# Patient Record
Sex: Male | Born: 1940
Health system: Southern US, Community
[De-identification: ages and names within clinical notes are randomized; demographics above are authoritative.]

## PROBLEM LIST (undated history)

## (undated) DIAGNOSIS — N400 Enlarged prostate without lower urinary tract symptoms: Secondary | ICD-10-CM

## (undated) DIAGNOSIS — R609 Edema, unspecified: Secondary | ICD-10-CM

## (undated) DIAGNOSIS — Z8719 Personal history of other diseases of the digestive system: Secondary | ICD-10-CM

## (undated) DIAGNOSIS — E782 Mixed hyperlipidemia: Secondary | ICD-10-CM

## (undated) DIAGNOSIS — M549 Dorsalgia, unspecified: Secondary | ICD-10-CM

## (undated) DIAGNOSIS — M109 Gout, unspecified: Secondary | ICD-10-CM

## (undated) DIAGNOSIS — I10 Essential (primary) hypertension: Secondary | ICD-10-CM

## (undated) DIAGNOSIS — N189 Chronic kidney disease, unspecified: Secondary | ICD-10-CM

## (undated) DIAGNOSIS — E119 Type 2 diabetes mellitus without complications: Secondary | ICD-10-CM

## (undated) DIAGNOSIS — I700 Atherosclerosis of aorta: Secondary | ICD-10-CM

## (undated) DIAGNOSIS — Z85118 Personal history of other malignant neoplasm of bronchus and lung: Secondary | ICD-10-CM

## (undated) DIAGNOSIS — E1165 Type 2 diabetes mellitus with hyperglycemia: Secondary | ICD-10-CM

## (undated) DIAGNOSIS — M519 Unspecified thoracic, thoracolumbar and lumbosacral intervertebral disc disorder: Secondary | ICD-10-CM

## (undated) DIAGNOSIS — F1021 Alcohol dependence, in remission: Secondary | ICD-10-CM

## (undated) DIAGNOSIS — D509 Iron deficiency anemia, unspecified: Secondary | ICD-10-CM

## (undated) DIAGNOSIS — J984 Other disorders of lung: Secondary | ICD-10-CM

## (undated) HISTORY — DX: Iron deficiency anemia, unspecified: D50.9

## (undated) HISTORY — DX: Essential (primary) hypertension: I10

## (undated) HISTORY — DX: Chronic kidney disease, unspecified: N18.9

## (undated) HISTORY — DX: Type 2 diabetes mellitus without complications: E11.9

## (undated) HISTORY — DX: Personal history of other malignant neoplasm of bronchus and lung: Z85.118

## (undated) HISTORY — DX: Benign prostatic hyperplasia without lower urinary tract symptoms: N40.0

## (undated) HISTORY — DX: Unspecified thoracic, thoracolumbar and lumbosacral intervertebral disc disorder: M51.9

## (undated) HISTORY — DX: Other disorders of lung: J98.4

## (undated) HISTORY — DX: Atherosclerosis of aorta: I70.0

## (undated) HISTORY — PX: BACK SURGERY: SHX140

## (undated) HISTORY — DX: Mixed hyperlipidemia: E78.2

## (undated) HISTORY — DX: Type 2 diabetes mellitus with hyperglycemia: E11.65

## (undated) HISTORY — DX: Personal history of other diseases of the digestive system: Z87.19

## (undated) HISTORY — DX: Dorsalgia, unspecified: M54.9

## (undated) HISTORY — DX: Alcohol dependence, in remission: F10.21

## (undated) HISTORY — DX: Edema, unspecified: R60.9

## (undated) HISTORY — DX: Gout, unspecified: M10.9

---

## 2002-06-20 ENCOUNTER — Ambulatory Visit: Admission: RE | Admit: 2002-06-20 | Discharge: 2002-06-20 | Payer: Self-pay | Admitting: Internal Medicine

## 2013-10-16 ENCOUNTER — Encounter: Payer: Self-pay | Admitting: Podiatrist

## 2013-10-16 ENCOUNTER — Ambulatory Visit (INDEPENDENT_AMBULATORY_CARE_PROVIDER_SITE_OTHER): Payer: Medicare HMO | Admitting: Podiatrist

## 2013-10-16 DIAGNOSIS — M216X1 Other acquired deformities of right foot: Secondary | ICD-10-CM

## 2013-10-16 DIAGNOSIS — M216X9 Other acquired deformities of unspecified foot: Secondary | ICD-10-CM

## 2013-10-16 NOTE — Progress Notes (Signed)
   Subjective:    Patient ID: Jeffrey Randall, male    DOB: June 29, 1940, 73 y.o.   MRN: [id number]  HPI I AM A DIABETIC AND NEED MY FEET CHECKED    Review of Systems  Cardiovascular: Positive for leg swelling.  Neurological: Positive for headaches.  All other systems reviewed and are negative.      Objective:   Physical Exam GENERAL APPEARANCE: Alert, conversant. Appropriately groomed. No acute distress.  VASCULAR: Pedal pulses palpable at 1/4 DP and PT bilateral.  Capillary refill time is immediate to all digits,  Proximal to distal cooling it warm to warm.  No swelling is noted at today's visit however he states he does at noticed swelling at the sock level at times.  NEUROLOGIC: sensation is intact epicritically and protectively to 5.07 monofilament at 5/5 sites bilateral.  Light touch is intact bilateral, vibratory sensation intact bilateral, achilles tendon reflex is intact bilateral.  MUSCULOSKELETAL: acceptable muscle strength, tone and stability bilateral.  Prominent first metatarsal head is present on the right which causes pain at times.  Intrinsic muscluature intact bilateral.   DERMATOLOGIC: skin color, texture, and turger are within normal limits.  No preulcerative lesions are seen, no interdigital maceration noted.  No open lesions present.  Digital nails are mildly thick and dystrophic 1 and 2 bilateral.      Assessment & Plan:  Diabetes without complication, symptomatic toenails x 4  Plan: After her diabetic foot evaluation was undertaken at today's visit. I debrided the mycotic nails without complication. He'll be seen back as needed for followup. Discussed we can consider a orthotic for the submetatarsal one discomfort however he states that it is sporadic at this time and he will like to hold off. Also discussed the swelling. Recommended compression stockings or speaking with his primary care physician about swelling should it become a problem. He will be seen back as needed  for followup.

## 2013-10-16 NOTE — Patient Instructions (Signed)
Diabetes and Foot Care Diabetes may cause you to have problems because of poor blood supply (circulation) to your feet and legs. This may cause the skin on your feet to become thinner, break easier, and heal more slowly. Your skin may become dry, and the skin may peel and crack. You may also have nerve damage in your legs and feet causing decreased feeling in them. You may not notice minor injuries to your feet that could lead to infections or more serious problems. Taking care of your feet is one of the most important things you can do for yourself.  HOME CARE INSTRUCTIONS  Wear shoes at all times, even in the house. Do not go barefoot. Bare feet are easily injured.  Check your feet daily for blisters, cuts, and redness. If you cannot see the bottom of your feet, use a mirror or ask someone for help.  Wash your feet with warm water (do not use hot water) and mild soap. Then pat your feet and the areas between your toes until they are completely dry. Do not soak your feet as this can dry your skin.  Apply a moisturizing lotion or petroleum jelly (that does not contain alcohol and is unscented) to the skin on your feet and to dry, brittle toenails. Do not apply lotion between your toes.  Trim your toenails straight across. Do not dig under them or around the cuticle. File the edges of your nails with an emery board or nail file.  Do not cut corns or calluses or try to remove them with medicine.  Wear clean socks or stockings every day. Make sure they are not too tight. Do not wear knee-high stockings since they may decrease blood flow to your legs.  Wear shoes that fit properly and have enough cushioning. To break in new shoes, wear them for just a few hours a day. This prevents you from injuring your feet. Always look in your shoes before you put them on to be sure there are no objects inside.  Do not cross your legs. This may decrease the blood flow to your feet.  If you find a minor scrape,  cut, or break in the skin on your feet, keep it and the skin around it clean and dry. These areas may be cleansed with mild soap and water. Do not cleanse the area with peroxide, alcohol, or iodine.  When you remove an adhesive bandage, be sure not to damage the skin around it.  If you have a wound, look at it several times a day to make sure it is healing.  Do not use heating pads or hot water bottles. They may burn your skin. If you have lost feeling in your feet or legs, you may not know it is happening until it is too late.  Make sure your health care provider performs a complete foot exam at least annually or more often if you have foot problems. Report any cuts, sores, or bruises to your health care provider immediately. SEEK MEDICAL CARE IF:   You have an injury that is not healing.  You have cuts or breaks in the skin.  You have an ingrown nail.  You notice redness on your legs or feet.  You feel burning or tingling in your legs or feet.  You have pain or cramps in your legs and feet.  Your legs or feet are numb.  Your feet always feel cold. SEEK IMMEDIATE MEDICAL CARE IF:   There is increasing redness,   swelling, or pain in or around a wound.  There is a red line that goes up your leg.  Pus is coming from a wound.  You develop a fever or as directed by your health care provider.  You notice a bad smell coming from an ulcer or wound. Document Released: 03/26/2000 Document Revised: 11/29/2012 Document Reviewed: 09/05/2012 [employer] Patient Information 2015 [employer]. This information is not intended to replace advice given to you by your health care provider. Make sure you discuss any questions you have with your health care provider.  

## 2014-06-14 ENCOUNTER — Encounter (HOSPITAL_COMMUNITY): Payer: Self-pay | Admitting: Emergency Medicine

## 2014-06-14 ENCOUNTER — Emergency Department (HOSPITAL_COMMUNITY)
Admission: EM | Admit: 2014-06-14 | Discharge: 2014-06-14 | Disposition: A | Discharge status: Home / Self Care | Payer: PPO | Source: Home / Self Care | Attending: Emergency Medicine | Admitting: Emergency Medicine

## 2014-06-14 ENCOUNTER — Emergency Department (HOSPITAL_COMMUNITY): Payer: PPO

## 2014-06-14 DIAGNOSIS — R062 Wheezing: Secondary | ICD-10-CM | POA: Insufficient documentation

## 2014-06-14 DIAGNOSIS — R739 Hyperglycemia, unspecified: Secondary | ICD-10-CM

## 2014-06-14 DIAGNOSIS — M549 Dorsalgia, unspecified: Secondary | ICD-10-CM | POA: Diagnosis not present

## 2014-06-14 DIAGNOSIS — Z720 Tobacco use: Secondary | ICD-10-CM | POA: Insufficient documentation

## 2014-06-14 DIAGNOSIS — I10 Essential (primary) hypertension: Secondary | ICD-10-CM | POA: Insufficient documentation

## 2014-06-14 DIAGNOSIS — M545 Low back pain, unspecified: Secondary | ICD-10-CM

## 2014-06-14 DIAGNOSIS — E1165 Type 2 diabetes mellitus with hyperglycemia: Secondary | ICD-10-CM | POA: Insufficient documentation

## 2014-06-14 DIAGNOSIS — Z880 Allergy status to penicillin: Secondary | ICD-10-CM | POA: Insufficient documentation

## 2014-06-14 DIAGNOSIS — M5136 Other intervertebral disc degeneration, lumbar region: Secondary | ICD-10-CM | POA: Diagnosis not present

## 2014-06-14 DIAGNOSIS — Z79899 Other long term (current) drug therapy: Secondary | ICD-10-CM | POA: Insufficient documentation

## 2014-06-14 DIAGNOSIS — Z7982 Long term (current) use of aspirin: Secondary | ICD-10-CM | POA: Insufficient documentation

## 2014-06-14 LAB — CBC WITH DIFFERENTIAL/PLATELET
BASOS ABS: 0 10*3/uL (ref 0.0–0.1)
BASOS PCT: 0 % (ref 0–1)
EOS ABS: 0 10*3/uL (ref 0.0–0.7)
EOS PCT: 0 % (ref 0–5)
HCT: 35.4 % — ABNORMAL LOW (ref 39.0–52.0)
Hemoglobin: 12.1 g/dL — ABNORMAL LOW (ref 13.0–17.0)
LYMPHS PCT: 3 % — AB (ref 12–46)
Lymphs Abs: 0.4 10*3/uL — ABNORMAL LOW (ref 0.7–4.0)
MCH: 34.1 pg — ABNORMAL HIGH (ref 26.0–34.0)
MCHC: 34.2 g/dL (ref 30.0–36.0)
MCV: 99.7 fL (ref 78.0–100.0)
MONO ABS: 0.3 10*3/uL (ref 0.1–1.0)
MONOS PCT: 2 % — AB (ref 3–12)
NEUTROS ABS: 12.1 10*3/uL — AB (ref 1.7–7.7)
Neutrophils Relative %: 95 % — ABNORMAL HIGH (ref 43–77)
Platelets: 350 10*3/uL (ref 150–400)
RBC: 3.55 MIL/uL — AB (ref 4.22–5.81)
RDW: 13.7 % (ref 11.5–15.5)
WBC: 12.8 10*3/uL — AB (ref 4.0–10.5)

## 2014-06-14 LAB — COMPREHENSIVE METABOLIC PANEL
ALBUMIN: 2.9 g/dL — AB (ref 3.5–5.2)
ALT: 27 U/L (ref 0–53)
AST: 27 U/L (ref 0–37)
Alkaline Phosphatase: 64 U/L (ref 39–117)
Anion gap: 10 (ref 5–15)
BUN: 24 mg/dL — ABNORMAL HIGH (ref 6–23)
CALCIUM: 10.3 mg/dL (ref 8.4–10.5)
CO2: 25 mmol/L (ref 19–32)
Chloride: 98 mmol/L (ref 96–112)
Creatinine, Ser: 1.48 mg/dL — ABNORMAL HIGH (ref 0.50–1.35)
GFR calc Af Amer: 52 mL/min — ABNORMAL LOW (ref >90)
GFR, EST NON AFRICAN AMERICAN: 45 mL/min — AB (ref >90)
Glucose, Bld: 407 mg/dL — ABNORMAL HIGH (ref 70–99)
Potassium: 4.8 mmol/L (ref 3.5–5.1)
SODIUM: 133 mmol/L — AB (ref 135–145)
Total Bilirubin: 0.5 mg/dL (ref 0.3–1.2)
Total Protein: 6.6 g/dL (ref 6.0–8.3)

## 2014-06-14 LAB — URINALYSIS, ROUTINE W REFLEX MICROSCOPIC
Bilirubin Urine: NEGATIVE
Hgb urine dipstick: NEGATIVE
Ketones, ur: NEGATIVE mg/dL
LEUKOCYTES UA: NEGATIVE
Nitrite: NEGATIVE
PH: 6 (ref 5.0–8.0)
Protein, ur: NEGATIVE mg/dL
Specific Gravity, Urine: 1.01 (ref 1.005–1.030)
Urobilinogen, UA: 0.2 mg/dL (ref 0.0–1.0)

## 2014-06-14 LAB — C-REACTIVE PROTEIN: CRP: 2.8 mg/dL — AB (ref <0.60)

## 2014-06-14 LAB — SEDIMENTATION RATE: Sed Rate: 100 mm/hr — ABNORMAL HIGH (ref 0–16)

## 2014-06-14 LAB — CBG MONITORING, ED: Glucose-Capillary: 368 mg/dL — ABNORMAL HIGH (ref 70–99)

## 2014-06-14 LAB — URINE MICROSCOPIC-ADD ON

## 2014-06-14 MED ORDER — GADOBENATE DIMEGLUMINE 529 MG/ML IV SOLN
15.0000 mL | Freq: Once | INTRAVENOUS | Status: AC | PRN
  Administered 2014-06-14: 15 mL via INTRAVENOUS

## 2014-06-14 MED ORDER — HYDROMORPHONE HCL 1 MG/ML IJ SOLN
1.0000 mg | Freq: Once | INTRAMUSCULAR | Status: AC
  Administered 2014-06-14: 1 mg via INTRAVENOUS
  Filled 2014-06-14: qty 1

## 2014-06-14 MED ORDER — HYDROMORPHONE HCL 1 MG/ML IJ SOLN
INTRAMUSCULAR | Status: AC
  Filled 2014-06-14: qty 1

## 2014-06-14 MED ORDER — SODIUM CHLORIDE 0.9 % IV BOLUS (SEPSIS)
1000.0000 mL | Freq: Once | INTRAVENOUS | Status: AC
  Administered 2014-06-14: 1000 mL via INTRAVENOUS

## 2014-06-14 NOTE — ED Provider Notes (Signed)
CSN: [id number]     Arrival date & time 06/14/14  1429 History   First MD Initiated Contact with Patient 06/14/14 1549     Chief Complaint  Patient presents with  . Back Pain     Patient is a 74 y.o. male presenting with back pain. The history is provided by the patient. No language interpreter was used.  Back Pain  Jeffrey Randall presents for evaluation of low back pain. History is provided by patient and his son. He reports low back pain that started 6 days ago.   Pain is described as sharp and spreading across the entire low back. Pain is constant and worse with movement. He has difficulty with minimal movement due to pain. He did have abdominal pain 7 days ago with associated diaphoresis, which completely resolved prior to his back pain starting.   He denies any associated fevers, chest pain, nausea, vomiting, dysuria, urinary incontinence or retention, diarrhea. He was seen at the [facility] emergency department 5 days ago with CT scan of his abdomen and labs performed. He was started on oxycodone at that time and prednisone. He had minimal improvement in his symptoms with this medication since all his family doctor yesterday who recommended further evaluation the emergency department for ongoing pain. Symptoms are severe, constant, worsening.   Past Medical History  Diagnosis Date  . Diabetes mellitus without complication   . Swelling   . Hypertension    History reviewed. No pertinent past surgical history. History reviewed. No pertinent family history. History  Substance Use Topics  . Smoking status: Current Some Day Smoker  . Smokeless tobacco: Never Used  . Alcohol Use: Yes    Review of Systems  Musculoskeletal: Positive for back pain.  All other systems reviewed and are negative.     Allergies  Penicillins  Home Medications   Prior to Admission medications   Medication Sig Start Date End Date Taking? Authorizing Provider  aspirin 81 MG tablet Take 81 mg by mouth daily.     Historical Provider, MD  glimepiride (AMARYL) 2 MG tablet  09/04/13   Historical Provider, MD  losartan-hydrochlorothiazide (HYZAAR) 100-25 MG per tablet Take 1 tablet by mouth daily.    Historical Provider, MD  metFORMIN (GLUCOPHAGE) 1000 MG tablet  09/17/13   Historical Provider, MD  Multiple Vitamins-Minerals (CENTRUM SILVER PO) Take by mouth.    Historical Provider, MD  Omega-3 Fatty Acids (FISH OIL) 1200 MG CAPS Take by mouth.    Historical Provider, MD  simvastatin (ZOCOR) 20 MG tablet  10/01/13   Historical Provider, MD  traZODone (DESYREL) 50 MG tablet Take 50 mg by mouth at bedtime.    Historical Provider, MD  vitamin B-12 (CYANOCOBALAMIN) 1000 MCG tablet Take 2,500 mcg by mouth daily.    Historical Provider, MD   BP 160/72 mmHg  Pulse 80  Temp(Src) 98.7 F (37.1 C) (Oral)  Resp 18  SpO2 100% Physical Exam  Constitutional: He is oriented to person, place, and time. He appears well-developed and well-nourished.  HENT:  Head: Normocephalic and atraumatic.  Cardiovascular: Normal rate and regular rhythm.   No murmur heard. Pulmonary/Chest: Effort normal. No respiratory distress.  Occasional end expiratory wheezes  Abdominal: Soft. There is no tenderness. There is no rebound and no guarding.  Musculoskeletal: He exhibits no edema or tenderness.  There is no CVA or bony C, T, L-spine tenderness.  Neurological: He is alert and oriented to person, place, and time.  Significant pain with trying to sit up  in bed. 5 out of 5 strength in all four extremities. Sensation to light touch intact in all four extremities.  Skin: Skin is warm and dry.  Psychiatric: He has a normal mood and affect. His behavior is normal.  Nursing note and vitals reviewed.   ED Course  Procedures (including critical care time) Labs Review Labs Reviewed  COMPREHENSIVE METABOLIC PANEL - Abnormal; Notable for the following:    Sodium 133 (*)    Glucose, Bld 407 (*)    BUN 24 (*)    Creatinine, Ser 1.48 (*)     Albumin 2.9 (*)    GFR calc non Af Amer 45 (*)    GFR calc Af Amer 52 (*)    All other components within normal limits  CBC WITH DIFFERENTIAL/PLATELET - Abnormal; Notable for the following:    WBC 12.8 (*)    RBC 3.55 (*)    Hemoglobin 12.1 (*)    HCT 35.4 (*)    MCH 34.1 (*)    Neutrophils Relative % 95 (*)    Neutro Abs 12.1 (*)    Lymphocytes Relative 3 (*)    Lymphs Abs 0.4 (*)    Monocytes Relative 2 (*)    All other components within normal limits  URINALYSIS, ROUTINE W REFLEX MICROSCOPIC - Abnormal; Notable for the following:    Glucose, UA >1000 (*)    All other components within normal limits  SEDIMENTATION RATE - Abnormal; Notable for the following:    Sed Rate 100 (*)    All other components within normal limits  C-REACTIVE PROTEIN - Abnormal; Notable for the following:    CRP 2.8 (*)    All other components within normal limits  CBG MONITORING, ED - Abnormal; Notable for the following:    Glucose-Capillary 368 (*)    All other components within normal limits  URINE MICROSCOPIC-ADD ON    Imaging Review Mr Lumbar Spine W Wo Contrast  06/14/2014   CLINICAL DATA:  Low back pain with anterior thigh tingling for 4 days. History of to remote back surgeries.  EXAM: MRI LUMBAR SPINE WITHOUT AND WITH CONTRAST  TECHNIQUE: Multiplanar and multiecho pulse sequences of the lumbar spine were obtained without and with intravenous contrast.  CONTRAST:  8mL MULTIHANCE GADOBENATE DIMEGLUMINE 529 MG/ML IV SOLN  COMPARISON:  CT abdomen and pelvis 06/11/2014  FINDINGS: There is straightening of the normal lumbar lordosis. There is no listhesis. Vertebral body heights are preserved. There is diffuse lumbar disc desiccation. Severe disc space narrowing is present L5-S1 with prominent Modic type 2 degenerative marrow changes. No significant vertebral marrow edema is identified. There is no abnormal enhancement. Scattered, small Schmorl's nodes are present. Conus medullaris is normal in signal  and terminates at L1. 1.5 cm left adrenal nodule is unchanged.  L1-2:  Negative.  L2-3: Minimal disc bulging results in minimal bilateral neural foraminal narrowing without spinal stenosis.  L3-4: Minimal disc bulging results in minimal bilateral neural foraminal narrowing without spinal stenosis.  L4-5: Mild circumferential disc bulging and mild bilateral facet hypertrophy result in mild bilateral lateral recess narrowing without spinal canal or neural foraminal stenosis.  L5-S1: Severe disc space height loss and circumferential endplate osteophytosis result in moderate bilateral neural foraminal stenosis without spinal stenosis. Prior left laminectomy.  IMPRESSION: 1. Severe disc degeneration at L5-S1 resulting in moderate bilateral neural foraminal stenosis. No spinal stenosis. 2. Mild disc and facet degeneration elsewhere as above without evidence of neural impingement.   Electronically Signed   By:  Logan Bores   On: 06/14/2014 18:53     EKG Interpretation None      MDM   Final diagnoses:  Low back pain  Hyperglycemia    1700 - received and reviewed records from [facility].  Pt had blood cultures on 06/11/14.  1/2 bottles grew out Strep gallolyticus ssp.  CT abd was performed with no evidence of acute abnormality.    Pt here with new onset low back pain one week ago.  CBC with mild leukocytosis but pt currently on steroids.  MRI obtained to eval for evidence of discitis/osteomyelitis given 1/2 positive blood cultures at home.  MRI with severe disc degeneration but no evidence of infectious process or nerve impingement.  Pt does have hyperglycemia - he stopped his metformin and is currently on steroids.  Discussed oral fluid hydration, restarting his diabetic medication, close pcp follow up and return precautions.   Quintella Reichert, MD 06/14/14 2342

## 2014-06-14 NOTE — Discharge Instructions (Signed)
Your blood sugar was elevated today as well as your inflammatory markers (ESR or erythrocyte sedimentation rate).  Your Creatinine was also slightly elevated and this will need rechecked.  Please see your doctor in the next two days for recheck.  Drink plenty of fluids.  Take the pain medications as directed and take a stool softener, available over the counter.     Back Pain, Adult Low back pain is very common. About 1 in 5 people have back pain.The cause of low back pain is rarely dangerous. The pain often gets better over time.About half of people with a sudden onset of back pain feel better in just 2 weeks. About 8 in 10 people feel better by 6 weeks.  CAUSES Some common causes of back pain include:  Strain of the muscles or ligaments supporting the spine.  Wear and tear (degeneration) of the spinal discs.  Arthritis.  Direct injury to the back. DIAGNOSIS Most of the time, the direct cause of low back pain is not known.However, back pain can be treated effectively even when the exact cause of the pain is unknown.Answering your caregiver's questions about your overall health and symptoms is one of the most accurate ways to make sure the cause of your pain is not dangerous. If your caregiver needs more information, he or she may order lab work or imaging tests (X-rays or MRIs).However, even if imaging tests show changes in your back, this usually does not require surgery. HOME CARE INSTRUCTIONS For many people, back pain returns.Since low back pain is rarely dangerous, it is often a condition that people can learn to [facility] their own.   Remain active. It is stressful on the back to sit or stand in one place. Do not sit, drive, or stand in one place for more than 30 minutes at a time. Take short walks on level surfaces as soon as pain allows.Try to increase the length of time you walk each day.  Do not stay in bed.Resting more than 1 or 2 days can delay your recovery.  Do not  avoid exercise or work.Your body is made to move.It is not dangerous to be active, even though your back may hurt.Your back will likely heal faster if you return to being active before your pain is gone.  Pay attention to your body when you bend and lift. Many people have less discomfortwhen lifting if they bend their knees, keep the load close to their bodies,and avoid twisting. Often, the most comfortable positions are those that put less stress on your recovering back.  Find a comfortable position to sleep. Use a firm mattress and lie on your side with your knees slightly bent. If you lie on your back, put a pillow under your knees.  Only take over-the-counter or prescription medicines as directed by your caregiver. Over-the-counter medicines to reduce pain and inflammation are often the most helpful.Your caregiver may prescribe muscle relaxant drugs.These medicines help dull your pain so you can more quickly return to your normal activities and healthy exercise.  Put ice on the injured area.  Put ice in a plastic bag.  Place a towel between your skin and the bag.  Leave the ice on for 15-20 minutes, 03-04 times a day for the first 2 to 3 days. After that, ice and heat may be alternated to reduce pain and spasms.  Ask your caregiver about trying back exercises and gentle massage. This may be of some benefit.  Avoid feeling anxious or stressed.Stress increases muscle  tension and can worsen back pain.It is important to recognize when you are anxious or stressed and learn ways to manage it.Exercise is a great option. SEEK MEDICAL CARE IF:  You have pain that is not relieved with rest or medicine.  You have pain that does not improve in 1 week.  You have new symptoms.  You are generally not feeling well. SEEK IMMEDIATE MEDICAL CARE IF:   You have pain that radiates from your back into your legs.  You develop new bowel or bladder control problems.  You have unusual weakness  or numbness in your arms or legs.  You develop nausea or vomiting.  You develop abdominal pain.  You feel faint. Document Released: 03/29/2005 Document Revised: 09/28/2011 Document Reviewed: 07/31/2013 [facility] Patient Information 2015 [place], [place]. This information is not intended to replace advice given to you by your health care provider. Make sure you discuss any questions you have with your health care provider.  Hyperglycemia Hyperglycemia occurs when the glucose (sugar) in your blood is too high. Hyperglycemia can happen for many reasons, but it most often happens to people who do not know they have diabetes or are not managing their diabetes properly.  CAUSES  Whether you have diabetes or not, there are other causes of hyperglycemia. Hyperglycemia can occur when you have diabetes, but it can also occur in other situations that you might not be as aware of, such as: Diabetes  If you have diabetes and are having problems controlling your blood glucose, hyperglycemia could occur because of some of the following reasons:  Not following your meal plan.  Not taking your diabetes medications or not taking it properly.  Exercising less or doing less activity than you normally do.  Being sick. Pre-diabetes  This cannot be ignored. Before people develop Type 2 diabetes, they almost always have "pre-diabetes." This is when your blood glucose levels are higher than normal, but not yet high enough to be diagnosed as diabetes. Research has shown that some long-term damage to the body, especially the heart and circulatory system, may already be occurring during pre-diabetes. If you take action to manage your blood glucose when you have pre-diabetes, you may delay or prevent Type 2 diabetes from developing. Stress  If you have diabetes, you may be "diet" controlled or on oral medications or insulin to control your diabetes. However, you may find that your blood glucose is higher than usual  in the hospital whether you have diabetes or not. This is often referred to as "stress hyperglycemia." Stress can elevate your blood glucose. This happens because of hormones put out by the body during times of stress. If stress has been the cause of your high blood glucose, it can be followed regularly by your caregiver. That way he/she can make sure your hyperglycemia does not continue to get worse or progress to diabetes. Steroids  Steroids are medications that act on the infection fighting system (immune system) to block inflammation or infection. One side effect can be a rise in blood glucose. Most people can produce enough extra insulin to allow for this rise, but for those who cannot, steroids make blood glucose levels go even higher. It is not unusual for steroid treatments to "uncover" diabetes that is developing. It is not always possible to determine if the hyperglycemia will go away after the steroids are stopped. A special blood test called an A1c is sometimes done to determine if your blood glucose was elevated before the steroids were started. SYMPTOMS  Thirsty.  Frequent urination.  Dry mouth.  Blurred vision.  Tired or fatigue.  Weakness.  Sleepy.  Tingling in feet or leg. DIAGNOSIS  Diagnosis is made by monitoring blood glucose in one or all of the following ways:  A1c test. This is a chemical found in your blood.  Fingerstick blood glucose monitoring.  Laboratory results. TREATMENT  First, knowing the cause of the hyperglycemia is important before the hyperglycemia can be treated. Treatment may include, but is not be limited to:  Education.  Change or adjustment in medications.  Change or adjustment in meal plan.  Treatment for an illness, infection, etc.  More frequent blood glucose monitoring.  Change in exercise plan.  Decreasing or stopping steroids.  Lifestyle changes. HOME CARE INSTRUCTIONS   Test your blood glucose as directed.  Exercise  regularly. Your caregiver will give you instructions about exercise. Pre-diabetes or diabetes which comes on with stress is helped by exercising.  Eat wholesome, balanced meals. Eat often and at regular, fixed times. Your caregiver or nutritionist will give you a meal plan to guide your sugar intake.  Being at an ideal weight is important. If needed, losing as little as 10 to 15 pounds may help improve blood glucose levels. SEEK MEDICAL CARE IF:   You have questions about medicine, activity, or diet.  You continue to have symptoms (problems such as increased thirst, urination, or weight gain). SEEK IMMEDIATE MEDICAL CARE IF:   You are vomiting or have diarrhea.  Your breath smells fruity.  You are breathing faster or slower.  You are very sleepy or incoherent.  You have numbness, tingling, or pain in your feet or hands.  You have chest pain.  Your symptoms get worse even though you have been following your caregiver's orders.  If you have any other questions or concerns. Document Released: 09/22/2000 Document Revised: 06/21/2011 Document Reviewed: 07/26/2011 [facility] Patient Information 2015 [place], [place]. This information is not intended to replace advice given to you by your health care provider. Make sure you discuss any questions you have with your health care provider.

## 2014-06-14 NOTE — ED Notes (Signed)
Pt sts severe lower back pain not improved with pain meds or pred; pt family sts pt could be constipated or dehydrated

## 2014-06-17 ENCOUNTER — Emergency Department (HOSPITAL_COMMUNITY): Payer: PPO

## 2014-06-17 ENCOUNTER — Encounter (HOSPITAL_COMMUNITY): Payer: Self-pay | Admitting: Physical Medicine and Rehabilitation

## 2014-06-17 ENCOUNTER — Inpatient Hospital Stay (HOSPITAL_COMMUNITY)
Admission: EM | Admit: 2014-06-17 | Discharge: 2014-06-19 | DRG: 552 | Disposition: A | Discharge status: Home Health | Payer: PPO | Attending: Family Medicine | Admitting: Family Medicine

## 2014-06-17 DIAGNOSIS — M545 Low back pain, unspecified: Secondary | ICD-10-CM | POA: Diagnosis present

## 2014-06-17 DIAGNOSIS — M549 Dorsalgia, unspecified: Secondary | ICD-10-CM | POA: Diagnosis not present

## 2014-06-17 DIAGNOSIS — Z7982 Long term (current) use of aspirin: Secondary | ICD-10-CM | POA: Diagnosis not present

## 2014-06-17 DIAGNOSIS — E1165 Type 2 diabetes mellitus with hyperglycemia: Secondary | ICD-10-CM | POA: Diagnosis present

## 2014-06-17 DIAGNOSIS — M5136 Other intervertebral disc degeneration, lumbar region: Secondary | ICD-10-CM | POA: Diagnosis present

## 2014-06-17 DIAGNOSIS — D649 Anemia, unspecified: Secondary | ICD-10-CM | POA: Diagnosis present

## 2014-06-17 DIAGNOSIS — F17210 Nicotine dependence, cigarettes, uncomplicated: Secondary | ICD-10-CM | POA: Diagnosis not present

## 2014-06-17 DIAGNOSIS — M4806 Spinal stenosis, lumbar region: Secondary | ICD-10-CM | POA: Diagnosis present

## 2014-06-17 DIAGNOSIS — E871 Hypo-osmolality and hyponatremia: Secondary | ICD-10-CM | POA: Diagnosis present

## 2014-06-17 DIAGNOSIS — I129 Hypertensive chronic kidney disease with stage 1 through stage 4 chronic kidney disease, or unspecified chronic kidney disease: Secondary | ICD-10-CM | POA: Diagnosis not present

## 2014-06-17 DIAGNOSIS — N183 Chronic kidney disease, stage 3 (moderate): Secondary | ICD-10-CM | POA: Diagnosis present

## 2014-06-17 DIAGNOSIS — I10 Essential (primary) hypertension: Secondary | ICD-10-CM | POA: Insufficient documentation

## 2014-06-17 DIAGNOSIS — E119 Type 2 diabetes mellitus without complications: Secondary | ICD-10-CM | POA: Insufficient documentation

## 2014-06-17 HISTORY — DX: Dorsalgia, unspecified: M54.9

## 2014-06-17 LAB — CBC WITH DIFFERENTIAL/PLATELET
BASOS ABS: 0 10*3/uL (ref 0.0–0.1)
Basophils Relative: 0 % (ref 0–1)
Eosinophils Absolute: 0 10*3/uL (ref 0.0–0.7)
Eosinophils Relative: 0 % (ref 0–5)
HCT: 35.1 % — ABNORMAL LOW (ref 39.0–52.0)
HEMOGLOBIN: 12 g/dL — AB (ref 13.0–17.0)
LYMPHS PCT: 5 % — AB (ref 12–46)
Lymphs Abs: 0.8 10*3/uL (ref 0.7–4.0)
MCH: 34.2 pg — AB (ref 26.0–34.0)
MCHC: 34.2 g/dL (ref 30.0–36.0)
MCV: 100 fL (ref 78.0–100.0)
MONO ABS: 1.3 10*3/uL — AB (ref 0.1–1.0)
Monocytes Relative: 9 % (ref 3–12)
NEUTROS ABS: 13.3 10*3/uL — AB (ref 1.7–7.7)
Neutrophils Relative %: 86 % — ABNORMAL HIGH (ref 43–77)
PLATELETS: 490 10*3/uL — AB (ref 150–400)
RBC: 3.51 MIL/uL — ABNORMAL LOW (ref 4.22–5.81)
RDW: 13.6 % (ref 11.5–15.5)
WBC: 15.5 10*3/uL — ABNORMAL HIGH (ref 4.0–10.5)

## 2014-06-17 LAB — COMPREHENSIVE METABOLIC PANEL
ALT: 21 U/L (ref 0–53)
AST: 16 U/L (ref 0–37)
Albumin: 2.6 g/dL — ABNORMAL LOW (ref 3.5–5.2)
Alkaline Phosphatase: 58 U/L (ref 39–117)
Anion gap: 4 — ABNORMAL LOW (ref 5–15)
BUN: 21 mg/dL (ref 6–23)
CO2: 28 mmol/L (ref 19–32)
Calcium: 9.6 mg/dL (ref 8.4–10.5)
Chloride: 101 mmol/L (ref 96–112)
Creatinine, Ser: 1.51 mg/dL — ABNORMAL HIGH (ref 0.50–1.35)
GFR calc Af Amer: 51 mL/min — ABNORMAL LOW (ref >90)
GFR, EST NON AFRICAN AMERICAN: 44 mL/min — AB (ref >90)
GLUCOSE: 277 mg/dL — AB (ref 70–99)
Potassium: 4.2 mmol/L (ref 3.5–5.1)
Sodium: 133 mmol/L — ABNORMAL LOW (ref 135–145)
Total Bilirubin: 0.5 mg/dL (ref 0.3–1.2)
Total Protein: 6 g/dL (ref 6.0–8.3)

## 2014-06-17 LAB — OCCULT BLOOD X 1 CARD TO LAB, STOOL: FECAL OCCULT BLD: NEGATIVE

## 2014-06-17 LAB — URINALYSIS, ROUTINE W REFLEX MICROSCOPIC
Bilirubin Urine: NEGATIVE
Glucose, UA: 250 mg/dL — AB
Hgb urine dipstick: NEGATIVE
Ketones, ur: NEGATIVE mg/dL
LEUKOCYTES UA: NEGATIVE
Nitrite: NEGATIVE
PH: 6.5 (ref 5.0–8.0)
PROTEIN: 30 mg/dL — AB
Specific Gravity, Urine: 1.02 (ref 1.005–1.030)
Urobilinogen, UA: 0.2 mg/dL (ref 0.0–1.0)

## 2014-06-17 LAB — GLUCOSE, CAPILLARY
Glucose-Capillary: 311 mg/dL — ABNORMAL HIGH (ref 70–99)
Glucose-Capillary: 337 mg/dL — ABNORMAL HIGH (ref 70–99)

## 2014-06-17 LAB — URINE MICROSCOPIC-ADD ON

## 2014-06-17 LAB — SEDIMENTATION RATE: Sed Rate: 60 mm/hr — ABNORMAL HIGH (ref 0–16)

## 2014-06-17 LAB — I-STAT CG4 LACTIC ACID, ED: LACTIC ACID, VENOUS: 1.5 mmol/L (ref 0.5–2.0)

## 2014-06-17 MED ORDER — DEXTROSE 5 % IV SOLN
2.0000 g | INTRAVENOUS | Status: DC
  Administered 2014-06-17: 2 g via INTRAVENOUS
  Filled 2014-06-17: qty 2

## 2014-06-17 MED ORDER — HYDROCHLOROTHIAZIDE 25 MG PO TABS
25.0000 mg | ORAL_TABLET | Freq: Every day | ORAL | Status: DC
  Administered 2014-06-17 – 2014-06-19 (×3): 25 mg via ORAL
  Filled 2014-06-17 (×3): qty 1

## 2014-06-17 MED ORDER — INSULIN ASPART 100 UNIT/ML ~~LOC~~ SOLN: 0.0000 [IU] | Freq: Three times a day (TID) | SUBCUTANEOUS | Status: DC

## 2014-06-17 MED ORDER — POLYETHYLENE GLYCOL 3350 17 G PO PACK: 17.0000 g | PACK | Freq: Every day | ORAL | Status: DC | PRN

## 2014-06-17 MED ORDER — INSULIN ASPART 100 UNIT/ML ~~LOC~~ SOLN
0.0000 [IU] | Freq: Three times a day (TID) | SUBCUTANEOUS | Status: DC
  Administered 2014-06-17: 7 [IU] via SUBCUTANEOUS
  Administered 2014-06-18 (×2): 3 [IU] via SUBCUTANEOUS
  Administered 2014-06-18: 2 [IU] via SUBCUTANEOUS
  Administered 2014-06-19: 1 [IU] via SUBCUTANEOUS
  Administered 2014-06-19: 3 [IU] via SUBCUTANEOUS

## 2014-06-17 MED ORDER — NICOTINE 21 MG/24HR TD PT24
21.0000 mg | MEDICATED_PATCH | TRANSDERMAL | Status: DC
  Administered 2014-06-17 – 2014-06-18 (×2): 21 mg via TRANSDERMAL
  Filled 2014-06-17: qty 1

## 2014-06-17 MED ORDER — TRAZODONE HCL 50 MG PO TABS
50.0000 mg | ORAL_TABLET | Freq: Every day | ORAL | Status: DC
  Administered 2014-06-17 – 2014-06-18 (×2): 50 mg via ORAL
  Filled 2014-06-17 (×2): qty 1

## 2014-06-17 MED ORDER — GLIMEPIRIDE 4 MG PO TABS
2.0000 mg | ORAL_TABLET | Freq: Every day | ORAL | Status: DC
  Administered 2014-06-18 – 2014-06-19 (×2): 2 mg via ORAL
  Filled 2014-06-17 (×2): qty 1

## 2014-06-17 MED ORDER — DIAZEPAM 5 MG/ML IJ SOLN
5.0000 mg | Freq: Once | INTRAMUSCULAR | Status: AC
  Administered 2014-06-17: 5 mg via INTRAVENOUS
  Filled 2014-06-17: qty 2

## 2014-06-17 MED ORDER — LOSARTAN POTASSIUM 50 MG PO TABS
100.0000 mg | ORAL_TABLET | Freq: Every day | ORAL | Status: DC
  Administered 2014-06-17 – 2014-06-19 (×3): 100 mg via ORAL
  Filled 2014-06-17 (×3): qty 2

## 2014-06-17 MED ORDER — SIMVASTATIN 5 MG PO TABS
10.0000 mg | ORAL_TABLET | Freq: Every day | ORAL | Status: DC
  Administered 2014-06-17 – 2014-06-18 (×2): 10 mg via ORAL
  Filled 2014-06-17 (×2): qty 2

## 2014-06-17 MED ORDER — SODIUM CHLORIDE 0.9 % IV SOLN: 250.0000 mL | INTRAVENOUS | Status: DC | PRN

## 2014-06-17 MED ORDER — NICOTINE 21 MG/24HR TD PT24
21.0000 mg | MEDICATED_PATCH | Freq: Every day | TRANSDERMAL | Status: DC
  Filled 2014-06-17: qty 1

## 2014-06-17 MED ORDER — SODIUM CHLORIDE 0.9 % IJ SOLN: 3.0000 mL | INTRAMUSCULAR | Status: DC | PRN

## 2014-06-17 MED ORDER — ASPIRIN 81 MG PO CHEW
81.0000 mg | CHEWABLE_TABLET | Freq: Every day | ORAL | Status: DC
  Administered 2014-06-17 – 2014-06-19 (×3): 81 mg via ORAL
  Filled 2014-06-17 (×3): qty 1

## 2014-06-17 MED ORDER — HEPARIN SODIUM (PORCINE) 5000 UNIT/ML IJ SOLN
5000.0000 [IU] | Freq: Three times a day (TID) | INTRAMUSCULAR | Status: DC
  Administered 2014-06-17 – 2014-06-19 (×5): 5000 [IU] via SUBCUTANEOUS
  Filled 2014-06-17 (×6): qty 1

## 2014-06-17 MED ORDER — SODIUM CHLORIDE 0.9 % IJ SOLN
3.0000 mL | Freq: Two times a day (BID) | INTRAMUSCULAR | Status: DC
  Administered 2014-06-17 – 2014-06-18 (×2): 3 mL via INTRAVENOUS

## 2014-06-17 MED ORDER — INSULIN ASPART 100 UNIT/ML ~~LOC~~ SOLN
0.0000 [IU] | Freq: Every day | SUBCUTANEOUS | Status: DC
  Administered 2014-06-17: 4 [IU] via SUBCUTANEOUS
  Administered 2014-06-18: 3 [IU] via SUBCUTANEOUS

## 2014-06-17 MED ORDER — TIZANIDINE HCL 4 MG PO TABS
4.0000 mg | ORAL_TABLET | Freq: Three times a day (TID) | ORAL | Status: DC
  Administered 2014-06-17 – 2014-06-19 (×5): 4 mg via ORAL
  Filled 2014-06-17 (×7): qty 1

## 2014-06-17 MED ORDER — ASPIRIN 81 MG PO TABS: 81.0000 mg | ORAL_TABLET | Freq: Every day | ORAL | Status: DC

## 2014-06-17 MED ORDER — SENNA 8.6 MG PO TABS
1.0000 | ORAL_TABLET | Freq: Two times a day (BID) | ORAL | Status: DC
  Administered 2014-06-17 – 2014-06-19 (×4): 8.6 mg via ORAL
  Filled 2014-06-17 (×4): qty 1

## 2014-06-17 MED ORDER — ACETAMINOPHEN 325 MG PO TABS: 650.0000 mg | ORAL_TABLET | Freq: Four times a day (QID) | ORAL | Status: DC | PRN

## 2014-06-17 MED ORDER — LOSARTAN POTASSIUM-HCTZ 100-25 MG PO TABS: 1.0000 | ORAL_TABLET | Freq: Every day | ORAL | Status: DC

## 2014-06-17 MED ORDER — HYDROMORPHONE HCL 1 MG/ML IJ SOLN
1.0000 mg | Freq: Once | INTRAMUSCULAR | Status: AC
  Administered 2014-06-17: 1 mg via INTRAVENOUS
  Filled 2014-06-17: qty 1

## 2014-06-17 MED ORDER — OXYCODONE-ACETAMINOPHEN 5-325 MG PO TABS
1.0000 | ORAL_TABLET | ORAL | Status: DC | PRN
  Administered 2014-06-17 – 2014-06-18 (×4): 1 via ORAL
  Filled 2014-06-17 (×4): qty 1

## 2014-06-17 MED ORDER — HYDROMORPHONE HCL 1 MG/ML IJ SOLN
1.0000 mg | INTRAMUSCULAR | Status: DC | PRN
  Administered 2014-06-17 – 2014-06-18 (×3): 1 mg via INTRAVENOUS
  Filled 2014-06-17 (×3): qty 1

## 2014-06-17 MED ORDER — CARVEDILOL 3.125 MG PO TABS
3.1250 mg | ORAL_TABLET | Freq: Two times a day (BID) | ORAL | Status: DC
Start: 2014-06-17 — End: 2014-06-19
  Administered 2014-06-17 – 2014-06-19 (×4): 3.125 mg via ORAL
  Filled 2014-06-17 (×4): qty 1

## 2014-06-17 MED ORDER — LIDOCAINE 4 % EX CREA
TOPICAL_CREAM | Freq: Two times a day (BID) | CUTANEOUS | Status: DC | PRN
  Filled 2014-06-17: qty 5

## 2014-06-17 NOTE — ED Provider Notes (Signed)
CSN: [id number]     Arrival date & time 06/17/14  1053 History   First MD Initiated Contact with Patient 06/17/14 1125     Chief Complaint  Patient presents with  . Back Pain     (Consider location/radiation/quality/duration/timing/severity/associated sxs/prior Treatment) The history is provided by the patient.  Jeffrey Randall is a 74 y.o. male hx of DM, HTN here with persistent back pain. Back in for the last week. He had extensive evaluation for this already. He went to [facility] 7 days ago and had positive blood culture that showed strep in one of 2 bottles. He had CT abdomen pelvis that was unremarkable. He was started on oxycodone and prednisone. He will came into the ER 3 days ago for persistent pain. At that time ESR was 100 and CRP was elevated. Patient also hyperglycemic to 300 and was thought to be steroid induced. He had an MRI that showed diffuse disc disease but no spinal cord compression. He was then subsequently sent home on Percocet. He states that the pain has been continuous and not improving. States the pain radiated to bilateral legs. He is a smoker but denies any fevers or chills or chest pain. He does have a nonproductive cough. Denies any numbness or weakness in his legs just pain. Denies any incontinence.   Past Medical History  Diagnosis Date  . Diabetes mellitus without complication   . Swelling   . Hypertension    History reviewed. No pertinent past surgical history. History reviewed. No pertinent family history. History  Substance Use Topics  . Smoking status: Current Some Day Smoker  . Smokeless tobacco: Never Used  . Alcohol Use: Yes    Review of Systems  Musculoskeletal: Positive for back pain.  All other systems reviewed and are negative.     Allergies  Chantix continuing month and Penicillins  Home Medications   Prior to Admission medications   Medication Sig Start Date End Date Taking? Authorizing Provider  aspirin 81 MG tablet Take  81 mg by mouth daily.   Yes Historical Provider, MD  carvedilol (COREG) 3.125 MG tablet Take 3.125 mg by mouth 2 (two) times daily with a meal.   Yes Historical Provider, MD  clindamycin (CLEOCIN) 300 MG capsule Take 300 mg by mouth 2 (two) times daily. For 10 days 06/15/14  Yes Historical Provider, MD  DiphenhydrAMINE HCl (ALLERGY MED PO) Take 1 tablet by mouth as needed (for allergies).   Yes Historical Provider, MD  Folic Acid-Vit B6-LSL H73 (FOLBEE) 2.5-25-1 MG TABS tablet Take 1 tablet by mouth daily.   Yes Historical Provider, MD  glimepiride (AMARYL) 2 MG tablet Take 2 mg by mouth daily with breakfast.  09/04/13  Yes Historical Provider, MD  losartan-hydrochlorothiazide (HYZAAR) 100-25 MG per tablet Take 1 tablet by mouth daily.   Yes Historical Provider, MD  metFORMIN (GLUCOPHAGE) 1000 MG tablet Take 1,000 mg by mouth 2 (two) times daily with a meal.  09/17/13  Yes Historical Provider, MD  Multiple Vitamins-Minerals (CENTRUM SILVER PO) Take 1 tablet by mouth daily.    Yes Historical Provider, MD  Omega-3 Fatty Acids (FISH OIL) 1200 MG CAPS Take 1,200 mg by mouth 2 (two) times daily.    Yes Historical Provider, MD  oxyCODONE-acetaminophen (PERCOCET/ROXICET) 5-325 MG per tablet Take 1 tablet by mouth every 4 (four) hours as needed for severe pain.   Yes Historical Provider, MD  predniSONE (DELTASONE) 10 MG tablet Take 10 mg by mouth as directed. For 12 days  06/12/14  Yes Historical Provider, MD  simvastatin (ZOCOR) 20 MG tablet Take 10 mg by mouth daily at 6 PM.  10/01/13  Yes Historical Provider, MD  traZODone (DESYREL) 50 MG tablet Take 50 mg by mouth at bedtime.   Yes Historical Provider, MD   BP 169/59 mmHg  Pulse 56  Temp(Src) 98.4 F (36.9 C) (Oral)  Resp 16  Ht $R'5\' 10"'if$  (1.778 m)  Wt 170 lb (77.111 kg)  BMI 24.39 kg/m2  SpO2 97% Physical Exam  Constitutional: He is oriented to person, place, and time.  Uncomfortable   HENT:  Head: Normocephalic.  Mouth/Throat: Oropharynx is clear and  moist.  Eyes: Conjunctivae are normal. Pupils are equal, round, and reactive to light.  Neck: Normal range of motion. Neck supple.  Cardiovascular: Normal rate, regular rhythm and normal heart sounds.   Pulmonary/Chest: Effort normal and breath sounds normal. No respiratory distress. He has no wheezes. He has no rales.  Abdominal: Soft. Bowel sounds are normal. He exhibits no distension. There is no tenderness. There is no rebound and no guarding.  Musculoskeletal:  No obvious midline tenderness, + paralumbar spasms   Neurological: He is alert and oriented to person, place, and time. No cranial nerve deficit. Coordination normal.  + straight leg raise bilaterally. No saddle anesthesia   Skin: Skin is warm and dry.  Psychiatric: He has a normal mood and affect. His behavior is normal. Judgment and thought content normal.  Nursing note and vitals reviewed.   ED Course  Procedures (including critical care time) Labs Review Labs Reviewed  CBC WITH DIFFERENTIAL/PLATELET - Abnormal; Notable for the following:    WBC 15.5 (*)    RBC 3.51 (*)    Hemoglobin 12.0 (*)    HCT 35.1 (*)    MCH 34.2 (*)    Platelets 490 (*)    Neutrophils Relative % 86 (*)    Neutro Abs 13.3 (*)    Lymphocytes Relative 5 (*)    Monocytes Absolute 1.3 (*)    All other components within normal limits  COMPREHENSIVE METABOLIC PANEL - Abnormal; Notable for the following:    Sodium 133 (*)    Glucose, Bld 277 (*)    Creatinine, Ser 1.51 (*)    Albumin 2.6 (*)    GFR calc non Af Amer 44 (*)    GFR calc Af Amer 51 (*)    Anion gap 4 (*)    All other components within normal limits  URINALYSIS, ROUTINE W REFLEX MICROSCOPIC - Abnormal; Notable for the following:    Glucose, UA 250 (*)    Protein, ur 30 (*)    All other components within normal limits  URINE MICROSCOPIC-ADD ON - Abnormal; Notable for the following:    Squamous Epithelial / LPF FEW (*)    Bacteria, UA FEW (*)    Casts HYALINE CASTS (*)    All  other components within normal limits  CULTURE, BLOOD (ROUTINE X 2)  CULTURE, BLOOD (ROUTINE X 2)  SEDIMENTATION RATE  I-STAT CG4 LACTIC ACID, ED    Imaging Review Dg Chest 2 View  06/17/2014   CLINICAL DATA:  Cough for 1 week  EXAM: CHEST  2 VIEW  COMPARISON:  06/11/2014  FINDINGS: Cardiomediastinal silhouette is stable. No acute infiltrate or pleural effusion. No pulmonary edema. Mild hyperinflation again noted. Bilateral nodular nipple shadow is noted.  IMPRESSION: No active cardiopulmonary disease.  Mild hyperinflation again noted.   Electronically Signed   By: Orlean Bradford.D.  On: 06/17/2014 12:55     EKG Interpretation None      MDM   Final diagnoses:  None    Jeffrey Randall is a 74 y.o. male here with persistent back pain. Had elevated ESR several days earlier with positive blood culture 1/2 a week earlier. Consider endocarditis vs bacteremia vs rheumatologic disorders. Consider osteomyelitis as well but was ruled out in MRI 3 days ago. Will do sepsis workup and give pain meds and likely admit for pain control.   2:07 PM WBC 15, increased from 12. UA and CXR unremarkable. Still has severe pain. Will admit. Cultures sent. Given empiric abx for bacteremia.      Wandra Arthurs, MD 06/17/14 [phone]

## 2014-06-17 NOTE — H&P (Signed)
[facility] Admission History and Physical Service Pager: [phone]  Patient name: Jeffrey Randall Medical record number: [id number] Date of birth: 22-Aug-1940 Age: 74 y.o. Gender: male  Primary Care Provider: Angelina Sheriff., MD Consultants: None Code Status: Full, confirmed on admission  Chief Complaint: Back Pain  Assessment and Plan: Jeffrey Randall is a 74 y.o. male presenting with a 1-week history of back pain. PMH is significant for HTN, diabetes,   #Back Pain, Lumbar: The patient presents with back pain worsening over the last week radiating down his anterior legs bilaterally. MRI showed disc degeneration at L5-S1 resulting in moderate bilateral neural foraminal stenosis. He appears to have SI joint related pain based on exam. No incontinence of bowel / bladder. He has a history of polyp on colonoscopy, and believes it is time for another colonoscopy. He also has a 50 pack year smoking history. 1/2 blood cultures at outside ED grew S. Gallolyticus, formerly S. Bovis, increasing suspicion for colon cancer. This pain is most likely musculoskeletal in etiology, with no imaging correlates to suggest metastatic disease. Although sed rate initially elevated to 100 and 60 today as well as CRP at 2.8. Normal lactic acid. Will attempt to treat MSK pain while working up other potential etiologies, including cardiac, infectious.  - Admit to FPTS, Dr. Mingo Amber attending - EKG am  - Blood cx x2 collected 3/7 at noon - Tizanidine 4mg  TID - Voltaren gel to lower back BID - Tylenol 650mg  Q6H mild pain - Percocet 5mg -325mg  q4h moderate pain - Dilaudid 1mg  IV q4h severe pain - Miralax PRN constipation  #Possbile Bacteremia: 1/2 blood cultures obtained at outside Ed grew Step Gallolyticus. Unsure if contaminant. Patient is normotensive, normal HR, normal RR, normal lactic acid, and alert and oriented. He does have a leukocytosis but most likely 2/2 to recent steroid use.  Will stop antibiotics for now and follow repeat blood cx.  - dc cefepime  - will obtain labs and records from outside.  - if worsens, will start broad spectrum ABX   #CKD III, elevated Cr: unsure of Scr baseline. On an ARB currently.  - bmp am   #Hypertension - Continue home antihypertensive medication: Coreg 3.125 BID, Losartan 100mg , HCTZ 25mg  - Continue simvastatin 10mg  daily  # Type 2 Diabetes Patient takes Metformin and glimepiride at home, creatinine is 1.51, Glc 277 on admission. Urinalysis with mild proteinuria. ARB as above for renal protection - Hold metformin with increased creatinine - A1c, TSH,  - Continue glimepiride - SSI  # Hyponatremia Patient presented with sodium of 133, continue to monitor.  FEN/GI: Saline lock IV, carb-modified diet,  Prophylaxis: SQ heparin,   Disposition: discharge pending pain control and workup for cause of back pain  History of Present Illness: Jeffrey Randall is a 74 y.o. male presenting with back pain for 1 week.   He reports low back pain that started one week ago. Pain is described as sharp and constant,  spreading across the entire low back and radiating down anterior thights. He has difficulty with movement due to pain, but denies weakness or numbness in legs. The pain has gotten progressively worse during the week.   He went to [facility] one week ago with positive blood culture that showed strep gallolyticus in one of 2 cultures. He had CT abdomen pelvis that was unremarkable. He was started on oxycodone and prednisone. He presented to the [facility] ED 3 days ago for persistent pain. At that time ESR  was 100 (now 60) and CRP was elevated. Patient also hyperglycemic to 300. An MRI that showed diffuse disc disease but no spinal cord compression. He was then subsequently sent home on Percocet. He states that the pain has been continuous and not improving since that   He is a smoker with a 50 pack-year smoking history. He has a  nonproductive cough for the last 3 weeks.  He denies any associated fevers/chills, chest pain, nausea, vomiting, dysuria, urinary/bowel incontinence or retention.   Review Of Systems: Per HPI with the following additions: None Otherwise 12 point review of systems was performed and was unremarkable.  Patient Active Problem List   Diagnosis Date Noted  . Low back pain 06/17/2014   Past Medical History: Past Medical History  Diagnosis Date  . Diabetes mellitus without complication   . Swelling   . Hypertension    Past Surgical History: History reviewed. No pertinent past surgical history. Social History: History  Substance Use Topics  . Smoking status: Current Some Day Smoker  . Smokeless tobacco: Never Used  . Alcohol Use: Yes   Additional social history: None Please also refer to relevant sections of EMR.  Family History: History reviewed. No pertinent family history. Allergies and Medications: Allergies  Allergen Reactions  . Chantix Continuing Month [Varenicline] Other (See Comments)    Doesn't work for patient  . Penicillins Other (See Comments)    unknown   No current facility-administered medications on file prior to encounter.   Current Outpatient Prescriptions on File Prior to Encounter  Medication Sig Dispense Refill  . aspirin 81 MG tablet Take 81 mg by mouth daily.    . carvedilol (COREG) 3.125 MG tablet Take 3.125 mg by mouth 2 (two) times daily with a meal.    . DiphenhydrAMINE HCl (ALLERGY MED PO) Take 1 tablet by mouth as needed (for allergies).    . Folic Acid-Vit G3-TDV V61 (FOLBEE) 2.5-25-1 MG TABS tablet Take 1 tablet by mouth daily.    Marland Kitchen glimepiride (AMARYL) 2 MG tablet Take 2 mg by mouth daily with breakfast.     . losartan-hydrochlorothiazide (HYZAAR) 100-25 MG per tablet Take 1 tablet by mouth daily.    . metFORMIN (GLUCOPHAGE) 1000 MG tablet Take 1,000 mg by mouth 2 (two) times daily with a meal.     . Multiple Vitamins-Minerals (CENTRUM  SILVER PO) Take 1 tablet by mouth daily.     . Omega-3 Fatty Acids (FISH OIL) 1200 MG CAPS Take 1,200 mg by mouth 2 (two) times daily.     . simvastatin (ZOCOR) 20 MG tablet Take 10 mg by mouth daily at 6 PM.     . traZODone (DESYREL) 50 MG tablet Take 50 mg by mouth at bedtime.      Objective: BP 151/57 mmHg  Pulse 57  Temp(Src) 98.4 F (36.9 C) (Oral)  Resp 17  Ht $R'5\' 10"'Tv$  (1.778 m)  Wt 77.111 kg (170 lb)  BMI 24.39 kg/m2  SpO2 95%   Exam: General: He appears well-developed and well-nourished. HEENT: NCAT, EOMI,  Cardiovascular: RRR, S1, S2 no MRG.  Pulmonary: End expiratory wheezes bilaterally, no increased WOB  Abdominal: Sort, nontender, nondistended, no rebound and no guarding.  Musculoskeletal: He exhibits no edema or tenderness. There is no CVA or spinal tenderness. 5/5 strength in all four extremities. ROM limited internal/external rotation at hips, limited hip flexion. Limited flexion / extension of back 2/2 pain. Proprioception and sensation preserved in toes. Able to ambulate 10' without assistance, endorsed pain.  Neurovascularly intact. Normal grip strength, 5/5 strength in UE b/l,  FABER and FADIR limited due to ROM.  Neurological: He is alert and oriented to person, place, and time.  Sensation to light touch intact in all four extremities. No patellar, 1+ ankle reflex. Skin: Skin is warm and dry.  Psychiatric: He has a normal mood and affect. His behavior is normal Rectal Exam: diminished tone, enlarged prostate, symmetric with no nodules .    Labs and Imaging: CBC BMET   Recent Labs Lab 06/17/14 1158  WBC 15.5*  HGB 12.0*  HCT 35.1*  PLT 490*    Recent Labs Lab 06/17/14 1158  NA 133*  K 4.2  CL 101  CO2 28  BUN 21  CREATININE 1.51*  GLUCOSE 277*  CALCIUM 9.6     Urinalysis    Component Value Date/Time   COLORURINE YELLOW 06/17/2014 1257   APPEARANCEUR CLEAR 06/17/2014 1257   LABSPEC 1.020 06/17/2014 1257   PHURINE 6.5 06/17/2014 1257    GLUCOSEU 250* 06/17/2014 1257   HGBUR NEGATIVE 06/17/2014 1257   [place] 06/17/2014 1257   [place] 06/17/2014 1257   PROTEINUR 30* 06/17/2014 1257   UROBILINOGEN 0.2 06/17/2014 1257   NITRITE NEGATIVE 06/17/2014 1257   LEUKOCYTESUR NEGATIVE 06/17/2014 1257   MRI L-Spine 3/4 IMPRESSION: 1. Severe disc degeneration at L5-S1 resulting in moderate bilateral neural foraminal stenosis. No spinal stenosis. 2. Mild disc and facet degeneration elsewhere as above without evidence of neural impingement.  CXR 2-view 3/7  FINDINGS: Cardiomediastinal silhouette is stable. No acute infiltrate or pleural effusion. No pulmonary edema. Mild hyperinflation again noted. Bilateral nodular nipple shadow is noted.  IMPRESSION: No active cardiopulmonary disease. Mild hyperinflation again noted.   Grandville Silos, Med Student 06/17/2014, 2:44 PM [place] Intern pager: [phone], text pages welcome  Upper Level Addendum:  I have seen and evaluated this patient along with Mr. Grandville Silos and reviewed the above note, making necessary revisions in [facility].   Clearance Coots, MD Family Medicine PGY-2

## 2014-06-17 NOTE — ED Notes (Signed)
Family at bedside. 

## 2014-06-17 NOTE — Progress Notes (Signed)
Pt oriented to unit. Bed alarm set and pt understands to call for assistance out of bed and to bathroom.  Call bell within reach.  Awaiting orders for pain medications.

## 2014-06-17 NOTE — Progress Notes (Signed)
ANTIBIOTIC CONSULT NOTE - INITIAL  Pharmacy Consult for cefepime Indication: bacteremia  Allergies  Allergen Reactions  . Chantix Continuing Month [Varenicline] Other (See Comments)    Doesn't work for patient  . Penicillins Other (See Comments)    unknown    Patient Measurements: Height: 5\' 10"  (177.8 cm) Weight: 170 lb (77.111 kg) IBW/kg (Calculated) : 73   Vital Signs: Temp: 98.4 F (36.9 C) (03/07 1056) Temp Source: Oral (03/07 1056) BP: 156/61 mmHg (03/07 1309) Pulse Rate: 71 (03/07 1309) Intake/Output from previous day:   Intake/Output from this shift:    Labs:  Recent Labs  06/14/14 1609 06/17/14 1158  WBC 12.8* 15.5*  HGB 12.1* 12.0*  PLT 350 490*  CREATININE 1.48* 1.51*   Estimated Creatinine Clearance: 44.3 mL/min (by C-G formula based on Cr of 1.51). No results for input(s): VANCOTROUGH, VANCOPEAK, VANCORANDOM, GENTTROUGH, GENTPEAK, GENTRANDOM, TOBRATROUGH, TOBRAPEAK, TOBRARND, AMIKACINPEAK, AMIKACINTROU, AMIKACIN in the last 72 hours.   Microbiology: No results found for this or any previous visit (from the past 720 hour(s)).  Medical History: Past Medical History  Diagnosis Date  . Diabetes mellitus without complication   . Swelling   . Hypertension     Medications:  See medication history Assessment: 74 yo man to start cefepime for bacteremia.  His CrCl ~ 44 ml/min.  He had 1/2 BC positive for strep at [facility] and presented to ER for persistent back pain.  Goal of Therapy:  Eradication of infection  Plan:  Cefepime 2gm IV q24 hours F/u clinical course, renal function and cultures.  Thanks for allowing pharmacy to be a part of this patient's care.  Excell Seltzer, PharmD Clinical Pharmacist, [phone] 06/17/2014,1:15 PM

## 2014-06-17 NOTE — ED Notes (Signed)
Pt transported to xray 

## 2014-06-17 NOTE — Progress Notes (Signed)
Pt admitted to 4N28 with lower back pain.  This is the third admission to the ED this week with excrutiating pain today.  Per ED RN, he was given Ativan and Dilaudid at noon with relief. VSS.  Family member at bedside.  MD at bedside also.  Will continue to monitor and orient patient to unit.

## 2014-06-17 NOTE — ED Notes (Signed)
Pt presents to department for evaluation of lower back pain. Ongoing x1 week, states pain increased this morning. 10/10 pain upon arrival to ED. Pt is alert and oriented x4.

## 2014-06-18 DIAGNOSIS — E119 Type 2 diabetes mellitus without complications: Secondary | ICD-10-CM | POA: Insufficient documentation

## 2014-06-18 DIAGNOSIS — M549 Dorsalgia, unspecified: Secondary | ICD-10-CM | POA: Insufficient documentation

## 2014-06-18 DIAGNOSIS — I10 Essential (primary) hypertension: Secondary | ICD-10-CM | POA: Insufficient documentation

## 2014-06-18 LAB — CBC
HEMATOCRIT: 32.9 % — AB (ref 39.0–52.0)
HEMOGLOBIN: 11.1 g/dL — AB (ref 13.0–17.0)
MCH: 33.6 pg (ref 26.0–34.0)
MCHC: 33.7 g/dL (ref 30.0–36.0)
MCV: 99.7 fL (ref 78.0–100.0)
Platelets: 409 10*3/uL — ABNORMAL HIGH (ref 150–400)
RBC: 3.3 MIL/uL — AB (ref 4.22–5.81)
RDW: 13.4 % (ref 11.5–15.5)
WBC: 9.6 10*3/uL (ref 4.0–10.5)

## 2014-06-18 LAB — BASIC METABOLIC PANEL
Anion gap: 6 (ref 5–15)
BUN: 21 mg/dL (ref 6–23)
CALCIUM: 9.9 mg/dL (ref 8.4–10.5)
CO2: 31 mmol/L (ref 19–32)
Chloride: 98 mmol/L (ref 96–112)
Creatinine, Ser: 1.16 mg/dL (ref 0.50–1.35)
GFR calc Af Amer: 70 mL/min — ABNORMAL LOW (ref >90)
GFR, EST NON AFRICAN AMERICAN: 60 mL/min — AB (ref >90)
GLUCOSE: 214 mg/dL — AB (ref 70–99)
Potassium: 3.8 mmol/L (ref 3.5–5.1)
SODIUM: 135 mmol/L (ref 135–145)

## 2014-06-18 LAB — GLUCOSE, CAPILLARY
GLUCOSE-CAPILLARY: 225 mg/dL — AB (ref 70–99)
Glucose-Capillary: 197 mg/dL — ABNORMAL HIGH (ref 70–99)
Glucose-Capillary: 259 mg/dL — ABNORMAL HIGH (ref 70–99)
Glucose-Capillary: 290 mg/dL — ABNORMAL HIGH (ref 70–99)

## 2014-06-18 LAB — TSH: TSH: 4.235 u[IU]/mL (ref 0.350–4.500)

## 2014-06-18 MED ORDER — GLUCERNA SHAKE PO LIQD
237.0000 mL | Freq: Two times a day (BID) | ORAL | Status: DC
  Administered 2014-06-18 – 2014-06-19 (×2): 237 mL via ORAL

## 2014-06-18 MED ORDER — ENSURE COMPLETE PO LIQD
237.0000 mL | Freq: Two times a day (BID) | ORAL | Status: DC
  Administered 2014-06-18: 237 mL via ORAL

## 2014-06-18 MED ORDER — OXYCODONE-ACETAMINOPHEN 5-325 MG PO TABS
1.0000 | ORAL_TABLET | ORAL | Status: DC | PRN
  Administered 2014-06-18 (×3): 2 via ORAL
  Administered 2014-06-19: 1 via ORAL
  Administered 2014-06-19 (×2): 2 via ORAL
  Administered 2014-06-19: 1 via ORAL
  Filled 2014-06-18 (×3): qty 2
  Filled 2014-06-18: qty 1
  Filled 2014-06-18: qty 2
  Filled 2014-06-18: qty 1
  Filled 2014-06-18: qty 2

## 2014-06-18 NOTE — Evaluation (Signed)
Physical Therapy Evaluation Patient Details Name: Jeffrey Randall MRN: [id number] DOB: [birth year] Today's Date: 06/18/2014   History of Present Illness  Patient is a 74 y/o male w/ PMH for HTN and DM2 who presents with 1 week of increasing back pain. Presented to [facility] ER due to pain- prescribed Oxycodone, didn't really help. Saw PCP on Wed, more opioids and steroids, again didn't really help.Presented to [facility] ED on 3/4. MRI at that time showed straightening of lumbar lordosis, preserved vertebral heights, and sever disc space narrowing at L5-S1. DC'ed home, returned on 3/7 due to persistent pain.     Clinical Impression  Patient presents with intractable back pain worsened with movement impacting mobility. Seems to have increased muscle spasms with mobility. Pt not a great historian when describing symptoms. Able to perform ambulation with use of RW for stability/safety. Will need to find out if pt's son will be able to assist at home as he lives/works in [place] but has been commuting to help out. Pt would benefit from skilled PT to improve gait, balance and mobility so pt can maximize independence and return to PLOF. Difficulty with pain control.    Follow Up Recommendations Home health PT;Supervision/Assistance - 24 hour    Equipment Recommendations  Rolling walker with 5" wheels    Recommendations for Other Services       Precautions / Restrictions Precautions Precautions: Fall Restrictions Weight Bearing Restrictions: No      Mobility  Bed Mobility Overal bed mobility: Needs Assistance Bed Mobility: Rolling;Sidelying to Sit;Sit to Sidelying Rolling: Supervision Sidelying to sit: Supervision;HOB elevated     Sit to sidelying: Supervision;HOB elevated General bed mobility comments: Use of rails. Cues for log roll technique. Increased pain with movement. Seems to be having spasms.  Transfers Overall transfer level: Needs assistance Equipment used: Rolling  walker (2 wheeled) Transfers: Sit to/from Stand Sit to Stand: Min guard         General transfer comment: Min guard to rise from EOB x3. Increased pain with back extension.  Ambulation/Gait Ambulation/Gait assistance: Min guard Ambulation Distance (Feet): 125 Feet Assistive device: Rolling walker (2 wheeled) Gait Pattern/deviations: Step-through pattern;Decreased stride length;Trunk flexed   Gait velocity interpretation: Below normal speed for age/gender General Gait Details: Pt with mildly unsteady gait due to spasms in low back/upper buttock during ambulation. Cues for RW management.   Stairs            Wheelchair Mobility    Modified Rankin (Stroke Patients Only)       Balance Overall balance assessment: Needs assistance Sitting-balance support: Feet supported;Single extremity supported Sitting balance-Leahy Scale: Fair Sitting balance - Comments: Position of comfort is 1 UE support posteriorly sitting EOB.   Standing balance support: During functional activity Standing balance-Leahy Scale: Fair                               Pertinent Vitals/Pain Pain Assessment: Faces Faces Pain Scale: Hurts whole lot Pain Location: back with movement Pain Descriptors / Indicators: Sharp;Aching;Sore;Shooting Pain Intervention(s): Monitored during session;Limited activity within patient's tolerance;Repositioned;Premedicated before session    [place] expects to be discharged to:: Private residence Living Arrangements: Alone (Son has been checking on him daily however works in [place] and lives in [place]) Available Help at Discharge: Family;Available PRN/intermittently Type of Home: House Home Access: Stairs to enter Entrance Stairs-Rails: Right Entrance Stairs-Number of Steps: 6 Home Layout: Two level Home Equipment: None  Prior Function Level of Independence: Independent;Needs assistance         Comments: Reports son has  been assisting with IADLs recently due to back pain.     Hand Dominance   Dominant Hand: Right    Extremity/Trunk Assessment   Upper Extremity Assessment: Defer to OT evaluation           Lower Extremity Assessment: Overall WFL for tasks assessed;RLE deficits/detail;LLE deficits/detail   LLE Deficits / Details: Concordant pain with active knee extension and hip flexion. Passive knee extension, no concordant pain.     Communication   Communication: No difficulties  Cognition Arousal/Alertness: Awake/alert Behavior During Therapy: WFL for tasks assessed/performed Overall Cognitive Status: No family/caregiver present to determine baseline cognitive functioning (Pt not a great historian when reporting symptoms or asked questions about symptoms. "yes" to most symptoms described by therapist.)                      General Comments General comments (skin integrity, edema, etc.): Able to perform AROM of spine into flexion/extension, SB to right/left. + concordant symptoms with lumbar flexion/extension and left SB. Pain to palpation along superior buttocks/low back - QL and glute med. Pt reports no position makes pain lessen and walking/mobility keeps pain the same.     Exercises        Assessment/Plan    PT Assessment Patient needs continued PT services  PT Diagnosis Difficulty walking   PT Problem List Pain;Decreased balance;Decreased mobility;Decreased activity tolerance  PT Treatment Interventions Balance training;Gait training;Patient/family education;Functional mobility training;Therapeutic activities;Therapeutic exercise;Stair training;DME instruction   PT Goals (Current goals can be found in the Care Plan section) Acute Rehab PT Goals Patient Stated Goal: to make this pain go away PT Goal Formulation: With patient Time For Goal Achievement: 07/02/14 Potential to Achieve Goals: Fair    Frequency Min 4X/week   Barriers to discharge Decreased caregiver  support;Inaccessible home environment Pt lives alone and has steps to climb to enter home and to get to bedroom    Co-evaluation               End of Session Equipment Utilized During Treatment: Gait belt Activity Tolerance: Patient limited by pain Patient left: in bed;with call bell/phone within reach;with bed alarm set Nurse Communication: Mobility status         Time: 1340-1400 PT Time Calculation (min) (ACUTE ONLY): 20 min   Charges:   PT Evaluation $Initial PT Evaluation Tier I: 1 Procedure     PT G CodesCandy Sledge A 2014/07/16, 2:34 PM Candy Sledge, [place], DPT [phone]

## 2014-06-18 NOTE — Progress Notes (Signed)
Pt. States increased dizziness since back pain began yesterday. Will continue to monitor. Joaquin Bend E, RN  06/18/2014 5:50 AM

## 2014-06-18 NOTE — Progress Notes (Signed)
Inpatient Diabetes Program Recommendations  AACE/ADA: New Consensus Statement on Inpatient Glycemic Control (2013)  Target Ranges:  Prepandial:   less than 140 mg/dL      Peak postprandial:   less than 180 mg/dL (1-2 hours)      Critically ill patients:  140 - 180 mg/dL   Results for Jeffrey Randall, Jeffrey Randall (MRN [id number]) as of 06/18/2014 13:35  Ref. Range 06/14/2014 20:31 06/17/2014 17:33 06/17/2014 21:48 06/18/2014 06:16 06/18/2014 11:17  Glucose-Capillary Latest Range: 70-99 mg/dL 368 (H) 311 (H) 337 (H) 197 (H) 225 (H)    Reason for Visit: elevated CBG  Diabetes history: Type 2 Outpatient Diabetes medications: Amaryl 2mg /day, Metformin 1000mg  bid Current orders for Inpatient glycemic control: Glimiperide 2mg /day, Novolog sensitive scale 0-9 units tid with meals and 0-5 units qhs  CBG remain elevated despite current diabetes medication, please consider adding Lantus 15 units q day (77kg x 0.2) and stopping Glimiperide.  Gentry Fitz, RN, BA, MHA, CDE Diabetes Coordinator Inpatient Diabetes Program  [phone] (Team Pager) [phone] [facility]) 06/18/2014 1:46 PM

## 2014-06-18 NOTE — Progress Notes (Signed)
CARE MANAGEMENT NOTE 06/18/2014  Patient:  Jeffrey Randall, Jeffrey Randall   Account Number:  [id number]  Date Initiated:  06/18/2014  Documentation initiated by:  Olga Coaster  Subjective/Objective Assessment:   ADMITTED WITH LOW BACK PAIN, ? BACTEREMIA, STAGE CKD     Action/Plan:   CM FOLLOWING FOR DCP   Anticipated DC Date:  06/22/2014   Anticipated DC Plan:  [employer]  CM consult       Status of service:  In process, will continue to follow  Per UR Regulation:  Reviewed for med. necessity/level of care/duration of stay  Comments:  3/8/3016Mindi Slicker RN,BSN,MHA [phone]

## 2014-06-18 NOTE — Progress Notes (Signed)
INITIAL NUTRITION ASSESSMENT  DOCUMENTATION CODES Per approved criteria  -Not Applicable   INTERVENTION: Provide Glucerna Shake po BID, each supplement provides 220 kcal and 10 grams of protein  RD to continue to monitor for PO adequacy  NUTRITION DIAGNOSIS: Predicted sub optimal energy intake related to varied appetite as evidenced by pt's report, mild wasting, and use of nutritional supplements PTA.   Goal: Pt to meet >/= 90% of their estimated nutrition needs   Monitor:  PO intake, weight trend, labs  Reason for Assessment: Malnutrition Screening Tool, score of 2  74 y.o. male  Admitting Dx: <principal problem not specified>  ASSESSMENT: 74 yo M with PMH for HTN and DM2 who presents with 1 week of increasing back pain.  Patient reports that after being diagnosed with diabetes 3-4 years ago he started eating less and lost down to 154 lbs. Recently he gained back to 165 lbs- he has been trying to eat more and he has been drinking 1-3 bottle of Ensure most days. Pt reports eating 100% of breakfast and lunch today as well as drinking one Ensure Complete this morning. He reports following a diabetic diet and denies any education needs at this time. He reports being instructed to stop taking Metformin PTA but, now instructed to start taking it again.  Labs: elevated glucose  Nutrition Focused Physical Exam:  Subcutaneous Fat:  Orbital Region: wnl Upper Arm Region: mild wasting Thoracic and Lumbar Region: NA  Muscle:  Temple Region: mild wasting Clavicle Bone Region: mild wasting Clavicle and Acromion Bone Region: wnl Scapular Bone Region: wnl Dorsal Hand: severe wasting Patellar Region: mild wasting Anterior Thigh Region: mild wasting Posterior Calf Region: mild wasting  Edema: none noted   Height: Ht Readings from Last 1 Encounters:  06/17/14 5\' 10"  (1.778 m)    Weight: Wt Readings from Last 1 Encounters:  06/17/14 170 lb (77.111 kg)    Ideal Body Weight:  166 lbs  % Ideal Body Weight: 102%  Wt Readings from Last 10 Encounters:  06/17/14 170 lb (77.111 kg)    Usual Body Weight: 165 lbs  % Usual Body Weight: 103%  BMI:  Body mass index is 24.39 kg/(m^2).  Estimated Nutritional Needs: Kcal: 1800-2000 Protein: 90-100 grams Fluid: 1.8-2 L./day  Skin: WDL  Diet Order: Diet Carb Modified  EDUCATION NEEDS: -No education needs identified at this time   Intake/Output Summary (Last 24 hours) at 06/18/14 1439 Last data filed at 06/18/14 1411  Gross per 24 hour  Intake    720 ml  Output      0 ml  Net    720 ml    Last BM: 3/7   Labs:   Recent Labs Lab 06/14/14 1609 06/17/14 1158 06/18/14 0638  NA 133* 133* 135  K 4.8 4.2 3.8  CL 98 101 98  CO2 25 28 31   BUN 24* 21 21  CREATININE 1.48* 1.51* 1.16  CALCIUM 10.3 9.6 9.9  GLUCOSE 407* 277* 214*    CBG (last 3)   Recent Labs  06/17/14 2148 06/18/14 0616 06/18/14 1117  GLUCAP 337* 197* 225*    Scheduled Meds: . aspirin  81 mg Oral Daily  . carvedilol  3.125 mg Oral BID WC  . feeding supplement (ENSURE COMPLETE)  237 mL Oral BID BM  . glimepiride  2 mg Oral Q breakfast  . heparin  5,000 Units Subcutaneous 3 times per day  . losartan  100 mg Oral Daily   And  . hydrochlorothiazide  25 mg Oral Daily  . insulin aspart  0-5 Units Subcutaneous QHS  . insulin aspart  0-9 Units Subcutaneous TID WC  . nicotine  21 mg Transdermal Q24H  . senna  1 tablet Oral BID  . simvastatin  10 mg Oral q1800  . sodium chloride  3 mL Intravenous Q12H  . tiZANidine  4 mg Oral TID  . traZODone  50 mg Oral QHS    Continuous Infusions:   Past Medical History  Diagnosis Date  . Diabetes mellitus without complication   . Swelling   . Hypertension     History reviewed. No pertinent past surgical history.  Pryor Ochoa RD, LDN Inpatient Clinical Dietitian Pager: [phone] After Hours Pager: [phone]

## 2014-06-18 NOTE — Progress Notes (Signed)
Family Medicine Teaching Service Daily Progress Note Intern Pager: [phone]  Patient name: Jeffrey Randall Medical record number: [id number] Date of birth: 24-May-1940 Age: 74 y.o. Gender: male  Primary Care Provider: Angelina Sheriff., MD Consultants: None Code Status: Full, confirmed on admission  Pt Overview and Major Events to Date:  2/29 Presented to [facility] ED for back pain, 1/2 cultures S. Gallolyticus, prescribed oxycodone 3/2 PCP last Wednesday, given more opioids and steroids 3/4 Presented to [facility] ED, MRI showed disc space narrowing at L5/S1 3/7 Admitted to [facility] for evaluation and management of back pain  Assessment and Plan: Jeffrey Randall is a 74 y.o. man presenting with a 1-week history of back pain. PMH is significant for HTN, diabetes, out of date on colonoscopy, 50 py smoking history  # Back Pain, Lumbar: The patient presented with back pain worsening over the last week radiating down his anterior legs bilaterally. MRI showed disc degeneration at L5-S1 resulting in moderate bilateral neural foraminal stenosis. He appears to have SI joint related pain based on exam. No incontinence of bowel / bladder. He has a history of polyp on colonoscopy, and believes it is time for another colonoscopy. He also has a 50 pack year smoking history. 1/2 blood cultures at outside ED grew S. Gallolyticus, formerly S. Bovis, increasing suspicion for colon cancer. This pain is most likely musculoskeletal in etiology, with no imaging correlates to suggest metastatic disease. Although sed rate initially elevated to 100 and 60 today as well as CRP at 2.8. Normal lactic acid. Will attempt to treat MSK pain while working up other potential etiologies, including cardiac, infectious.  - EKG am  - Tizanidine 4mg  TID - Lidocaine cream to lower back BID - Tylenol 650mg  Q6H mild pain - Percocet 5mg -325mg , 1 or 2 pills q4h moderate / severe pain - Miralax PRN constipation, Senna BID - PT Eval 3/8  #  Possbile Bacteremia: 1/2 blood cultures obtained at outside Ed grew Step Gallolyticus at 5 days. Unsure if contaminant. Patient is normotensive, normal HR, normal RR, normal lactic acid, and alert and oriented. He had a leukocytosis but most likely 2/2 to recent steroid use, as steroids DC'ed and leukocytosis resolved. Will stop antibiotics for now and follow repeat blood cx.  - dc cefepime  - Blood cx x2 collected 3/7 at noon, NGx1 day - outside labs showed 1/2 cultures with S. Gallolyticus at 5 days.  - if worsens, will start broad spectrum ABX   # Anemia:Patient presented with asymptomatic anemia and a hemoglobin of 12.0, which decreased to 11.1 after fluid resuscitation. MCV is 99.7 on most recent CBC, FOBT negative, but patient is out of date for colonoscopy. As the patient is currently asymptomatic, anemia workup deferred to outpatient setting.   - Continue to monitor Hb/Hct   # Hypertension - Continue home antihypertensive medication: Coreg 3.125 BID, Losartan 100mg , HCTZ 25mg  - Continue simvastatin 10mg  daily  # Type 2 Diabetes Patient takes Metformin and glimepiride at home, creatinine is 1.51, Glc 277 on admission. Urinalysis with mild proteinuria. ARB as above for renal protection - Hold metformin with increased creatinine - TSH wnl - Continue glimepiride - SSI  # Hyponatremia Patient presented with sodium of 133, continue to monitor. - AM BMET  #Elevated Cr, resolved: On an ARB currently. Presented with Creatinine of 1.48, AM BMP with creatinine of 1.16 FEN/GI: Saline lock IV, carb-modified diet,  Prophylaxis: SQ heparin,   Disposition: Continue to monitor until blood cx ngx48 hours, treat back pain.  Subjective:  Per nursing, patient states dizziness since yesterday. Patient stated that he was dizzy when he stood up to use the restroom overnight. He endorses asking for PRN doses of dilaudid, percocet, and trazodone in the evening before this dizziness event. States  that the pain medicine helps his pain, and that he feels an aching on his left side this morning that is improved from yesterday. He does not feel dizzy this morning, was able to eat all of his breakfast, denied nausea/vomiting, change in bowel habits. Wants to be at home, misses his two dogs.   Objective: Temp:  [97.6 F (36.4 C)-98.6 F (37 C)] 97.6 F (36.4 C) (03/08 0551) Pulse Rate:  [51-102] 53 (03/08 0551) Resp:  [16-18] 16 (03/08 0551) BP: (112-170)/(48-81) 143/69 mmHg (03/08 0551) SpO2:  [90 %-100 %] 98 % (03/08 0551) Weight:  [77.111 kg (170 lb)] 77.111 kg (170 lb) (03/07 1056) Physical Exam: Gen: Younger than stated age. Lying in hospital bed. NAD HEENT: PERRL, EOMI. MMM Neck: Supple Cardiac: Regular rate and rhythm without murmur auscultated. Good S1/S2. Pulm: Clear to auscultation bilaterally with good air movement. No wheezes or rales noted.  Abd: Soft/NT/ND Back: Able to roll over on side without pain. No tenderness to palpation of paraspinal muscles or spine itself. SLR positive for Right sided ipsilateral pain. Negative on Left. Minimal pain with flexion of knee. DTRs absent BL patellar region. Sensation intact throughout. Strength 5/5 throughout.   Laboratory:  Recent Labs Lab 06/14/14 1609 06/17/14 1158 06/18/14 0638  WBC 12.8* 15.5* 9.6  HGB 12.1* 12.0* 11.1*  HCT 35.4* 35.1* 32.9*  PLT 350 490* 409*    Recent Labs Lab 06/14/14 1609 06/17/14 1158  NA 133* 133*  K 4.8 4.2  CL 98 101  CO2 25 28  BUN 24* 21  CREATININE 1.48* 1.51*  CALCIUM 10.3 9.6  PROT 6.6 6.0  BILITOT 0.5 0.5  ALKPHOS 64 58  ALT 27 21  AST 27 16  GLUCOSE 407* 277*    Imaging/Diagnostic Tests: None  Grandville Silos, Med Student 06/18/2014, 8:06 AM FPTS Intern pager: [phone], text pages welcome   I agree with the medical student note above and have edited it as necessary. I formulated the plan and performed my own physical exam which are documented  above.  Beverlyn Roux, MD, MPH [facility] PGY-2 06/18/2014 1:10 PM

## 2014-06-19 LAB — GLUCOSE, CAPILLARY
GLUCOSE-CAPILLARY: 235 mg/dL — AB (ref 70–99)
Glucose-Capillary: 144 mg/dL — ABNORMAL HIGH (ref 70–99)

## 2014-06-19 LAB — HEMOGLOBIN A1C
HEMOGLOBIN A1C: 8.7 % — AB (ref 4.8–5.6)
MEAN PLASMA GLUCOSE: 203 mg/dL

## 2014-06-19 MED ORDER — OXYCODONE-ACETAMINOPHEN 5-325 MG PO TABS: 2.0000 | ORAL_TABLET | Freq: Four times a day (QID) | ORAL | Status: DC | PRN

## 2014-06-19 MED ORDER — TIZANIDINE HCL 4 MG PO TABS: 4.0000 mg | ORAL_TABLET | Freq: Three times a day (TID) | ORAL | Status: DC

## 2014-06-19 NOTE — Progress Notes (Signed)
Talked to patient with son present about [facility] choices, patient/ son chose [facility] for HHPT; Miranda with [facility] called for arrangements; RW ordered and to be delivered to the room today prior to discharge home todayAneta Mins [phone]

## 2014-06-19 NOTE — Discharge Summary (Signed)
[facility] Discharge Summary  Patient name: Jeffrey Randall Medical record number: [id number] Date of birth: 1941/02/06 Age: 74 y.o. Gender: male Date of Admission: 06/17/2014  Date of Discharge: 06/19/2014 Admitting Physician: Jeffrey Reasons, MD  Primary Care Provider: Angelina Randall., MD Consultants: Diabetes Counselor, Physical Therapy, Nutrition  Indication for Hospitalization: Back pain  Discharge Diagnoses/Problem List:  Patient Active Problem List   Diagnosis Date Noted  . Notalgia   . Essential hypertension   . Type 2 diabetes mellitus without complication   . Low back pain 06/17/2014  . Back pain 06/17/2014    Disposition: Follow up with PCP  Discharge Condition: Improved  Discharge Exam:  Filed Vitals:   06/19/14 0118 06/19/14 0654 06/19/14 0925 06/19/14 1310  BP: 120/57 124/60 137/52 131/50  Pulse: 60 65 76 74  Temp: 98.2 F (36.8 C) 98 F (36.7 C) 98.1 F (36.7 C) 98 F (36.7 C)  TempSrc: Oral Oral Oral Oral  Resp: 18 18 16 20   Height:      Weight:      SpO2: 96% 97% 93% 92%    Gen: Younger than stated age. Lying in hospital bed. NAD HEENT: MMM Neck: Supple Cardiac: Regular rate and rhythm without murmur auscultated. S1, S2 Pulm: Clear to auscultation bilaterally with good air movement. No wheezes or rales noted.  Abd: Soft/NT/ND Back: Able to roll over on side without pain. No tenderness to palpation of paraspinal muscles or spine itself. SLR positive for Right sided ipsilateral pain. Negative on Left. Minimal pain with flexion of knee. Sensation intact throughout. Strength 5/5 throughout.   Brief Hospital Course:   Jeffrey Randall is a 74 y.o. man presenting with a 1-week history of back pain. PMH is significant for HTN, diabetes, out of date on colonoscopy, 50 py smoking history.  Lumbar Back Pain: Most likely muscle spasm.  The patient presented with back pain worsening over the last week  radiating down his anterior legs bilaterally. He endorsed tripping over his dogs within the last month and landing on his left hip, though there does not appear to be a direct correlation between this fall and the patient's pain. He had presented to the [facility] ED, PCP, and previously to the [facility] ED over the prior week. An MRI showed disc degeneration at L5-S1 resulting in moderate bilateral neural foraminal stenosis. On exam, his pain appeared to be musculoskeletal in nature. Cardiac and infectious workups were negative. Physical therapy evaluated the patient on the day before discharge, and agreed with our assessment of a musculoskeletal etiology, specifically that he was having muscle spasms with mobility. They recommended home health PT. His pain improved to a 2-3/10 on the day of discharge after starting tizanidine and percocet.   Possbile Bacteremia 1/2 blood cultures obtained at the [facility] ED grew Step Gallolyticus at 5 days. This was most likely a contaminant. The patient was normotensive, normal HR, normal RR, normal lactic acid, and alert and oriented. He had a leukocytosis on admission, blood cultures x2 were collected and the patient was started on cefepime. Leukocytosis was most likely due to recent steroid use, as steroids were discontinued and the leukocytosis resolved. Blood cultures showed no growth at 2 days at the time of discharge.   Anemia The patient presented with asymptomatic anemia and a hemoglobin of 12.0, which decreased to 11.1 after fluid resuscitation. His MCV was 99.7 on most recent CBC, FOBT negative. The patient is out of date for colonoscopy as below.  His Hb/HCT were monitored during the hospitalization. As the patient is currently asymptomatic, so anemia workup deferred to outpatient setting.   Need for Colonoscopy The patient is out of date for his colonoscopy, and has a 50 pack year smoking history. 1/2 blood cultures at outside ED grew S. Gallolyticus, increasing  suspicion for colon cancer but cultures at [facility] showed no growth at 48 hrs on discharge.  FOBT was found to be negative upon admission.  Would recommend scheduling the patient for a colonoscopy.  Type 2 Diabetes Patient takes Metformin and glimepiride at home, but his creatinine was elevated to 1.51 on admission. His urinalysis showed mild proteinuria. The patient does take an ARB as above for renal protection. Metformin was held until the patient's creatinine reduced. He was continued on glimepiride with a sliding scale insulin. His A1C returned at 8.7, and diabetes counselors recommended adding Lantus 15 units q day (77kg x 0.2) and stopping Glimiperide, but this decision was deferred to outpatient physician. Nutrition consultant recommended adding Glucerna shakes as patient has sub-optimal energy intake.  Hypertension / Dyslipidemia The patient continued his home antihypertensive and Statin medications during the hospitalization: Coreg 3.125 BID, Losartan 100mg , HCTZ 25mg , simvastatin 10mg  daily.  Hyponatremia The patient presented with sodium of 133 which increased to normal range during hospitalization. His sodium was monitored with BMETs.  Issues for Follow Up:  1. Back pain medication / physical therapy regimen: stopped prednisone and added muscle relaxer. Most muscle spasm in nature. Will continue percocet PRN for severe pain.  2. Need for colonoscopy 3. Smoking Cessation 4. Mild Anemia 5. Diabetes Medication Changes 6. F/U blood cultures  Significant Procedures: None  Significant Labs and Imaging:   Recent Labs Lab 06/14/14 1609 06/17/14 1158 06/18/14 0638  WBC 12.8* 15.5* 9.6  HGB 12.1* 12.0* 11.1*  HCT 35.4* 35.1* 32.9*  PLT 350 490* 409*    Recent Labs Lab 06/14/14 1609 06/17/14 1158 06/18/14 0638  NA 133* 133* 135  K 4.8 4.2 3.8  CL 98 101 98  CO2 25 28 31   GLUCOSE 407* 277* 214*  BUN 24* 21 21  CREATININE 1.48* 1.51* 1.16  CALCIUM 10.3 9.6 9.9  ALKPHOS 64  58  --   AST 27 16  --   ALT 27 21  --   ALBUMIN 2.9* 2.6*  --    A1C 8.7  Results/Tests Pending at Time of Discharge: Blood cultures NG x 2 days,   Discharge Medications:    Medication List    STOP taking these medications        clindamycin 300 MG capsule  Commonly known as:  CLEOCIN     predniSONE 10 MG tablet  Commonly known as:  DELTASONE      TAKE these medications        ALLERGY MED PO  Take 1 tablet by mouth as needed (for allergies).     aspirin 81 MG tablet  Take 81 mg by mouth daily.     carvedilol 3.125 MG tablet  Commonly known as:  COREG  Take 3.125 mg by mouth 2 (two) times daily with a meal.     CENTRUM SILVER PO  Take 1 tablet by mouth daily.     Fish Oil 1200 MG Caps  Take 1,200 mg by mouth 2 (two) times daily.     Folic Acid-Vit W5-YKD X83 2.5-25-1 MG Tabs tablet  Commonly known as:  FOLBEE  Take 1 tablet by mouth daily.     glimepiride 2  MG tablet  Commonly known as:  AMARYL  Take 2 mg by mouth daily with breakfast.     losartan-hydrochlorothiazide 100-25 MG per tablet  Commonly known as:  HYZAAR  Take 1 tablet by mouth daily.     metFORMIN 1000 MG tablet  Commonly known as:  GLUCOPHAGE  Take 1,000 mg by mouth 2 (two) times daily with a meal.     oxyCODONE-acetaminophen 5-325 MG per tablet  Commonly known as:  PERCOCET/ROXICET  Take 1 tablet by mouth every 4 (four) hours as needed for severe pain.     simvastatin 20 MG tablet  Commonly known as:  ZOCOR  Take 10 mg by mouth daily at 6 PM.     tiZANidine 4 MG tablet  Commonly known as:  ZANAFLEX  Take 1 tablet (4 mg total) by mouth 3 (three) times daily.     traZODone 50 MG tablet  Commonly known as:  DESYREL  Take 50 mg by mouth at bedtime.        Discharge Instructions: Please refer to Patient Instructions section of EMR for full details.  Patient was counseled important signs and symptoms that should prompt return to medical care, changes in medications, dietary  instructions, activity restrictions, and follow up appointments.   Follow-Up Appointments: Follow-up Information    Follow up with [facility] Angelique Blonder., MD. Schedule an appointment as soon as possible for a visit in 1 week.   Specialty:  Family Medicine   Why:  hospital follow up   Contact information:   [place] [address] [phone]       Rosemarie Ax, MD 06/19/2014, 1:53 PM  Upper Level Addendum:  I have seen and evaluated this patient along with Mr. Grandville Silos and reviewed the above note, making necessary revisions in [facility].   Clearance Coots, MD Family Medicine PGY-2

## 2014-06-19 NOTE — Progress Notes (Signed)
Inpatient Diabetes Program Recommendations  AACE/ADA: New Consensus Statement on Inpatient Glycemic Control (2013)  Target Ranges:  Prepandial:   less than 140 mg/dL      Peak postprandial:   less than 180 mg/dL (1-2 hours)      Critically ill patients:  140 - 180 mg/dL   Results for Jeffrey Randall, Jeffrey Randall (MRN [id number]) as of 06/19/2014 10:34  Ref. Range 06/18/2014 06:16 06/18/2014 11:17 06/18/2014 16:10 06/18/2014 22:00 06/19/2014 06:53  Glucose-Capillary Latest Range: 70-99 mg/dL 197 (H) 225 (H) 259 (H) 290 (H) 144 (H)   Current orders for Inpatient glycemic control: Amaryl 2 mg QAM, Novolog 0-9 units TID with meals, Novolog 0-5 units HS  Inpatient Diabetes Program Recommendations Correction (SSI): Please consider increasing Novolog correction to moderate scale. Insulin - Meal Coverage: Please consider ordering Novolog 4 units TID with meals for meal coverage (in addition to Novolog correction scale).  Thanks, Barnie Alderman, RN, MSN, CCRN, CDE Diabetes Coordinator Inpatient Diabetes Program [phone] (Team Pager) [phone] ([facility] office) [phone] [facility] office)

## 2014-06-19 NOTE — Discharge Instructions (Signed)
It was a pleasure taking care of you in the hospital. We believe your back pain comes from muscle spasms, and have prescribed tizanidine. Take this medicine three times daily. It is important to follow up with your primary care physician early next week. Some things to ask your PCP about on follow up: 1. Back pain medication / physical therapy regimen 2. Need for colonoscopy 3. Smoking Cessation 4. Mild anemia 5. Diabetes medication changes  Back Pain, Adult Low back pain is very common. About 1 in 5 people have back pain.The cause of low back pain is rarely dangerous. The pain often gets better over time.About half of people with a sudden onset of back pain feel better in just 2 weeks. About 8 in 10 people feel better by 6 weeks.  CAUSES Some common causes of back pain include:  Strain of the muscles or ligaments supporting the spine.  Wear and tear (degeneration) of the spinal discs.  Arthritis.  Direct injury to the back. DIAGNOSIS Most of the time, the direct cause of low back pain is not known.However, back pain can be treated effectively even when the exact cause of the pain is unknown.Answering your caregiver's questions about your overall health and symptoms is one of the most accurate ways to make sure the cause of your pain is not dangerous. If your caregiver needs more information, he or she may order lab work or imaging tests (X-rays or MRIs).However, even if imaging tests show changes in your back, this usually does not require surgery. HOME CARE INSTRUCTIONS For many people, back pain returns.Since low back pain is rarely dangerous, it is often a condition that people can learn to [facility] their own.   Remain active. It is stressful on the back to sit or stand in one place. Do not sit, drive, or stand in one place for more than 30 minutes at a time. Take short walks on level surfaces as soon as pain allows.Try to increase the length of time you walk each day.  Do not  stay in bed.Resting more than 1 or 2 days can delay your recovery.  Do not avoid exercise or work.Your body is made to move.It is not dangerous to be active, even though your back may hurt.Your back will likely heal faster if you return to being active before your pain is gone.  Pay attention to your body when you bend and lift. Many people have less discomfortwhen lifting if they bend their knees, keep the load close to their bodies,and avoid twisting. Often, the most comfortable positions are those that put less stress on your recovering back.  Find a comfortable position to sleep. Use a firm mattress and lie on your side with your knees slightly bent. If you lie on your back, put a pillow under your knees.  Only take over-the-counter or prescription medicines as directed by your caregiver. Over-the-counter medicines to reduce pain and inflammation are often the most helpful.Your caregiver may prescribe muscle relaxant drugs.These medicines help dull your pain so you can more quickly return to your normal activities and healthy exercise.  Put ice on the injured area.  Put ice in a plastic bag.  Place a towel between your skin and the bag.  Leave the ice on for 15-20 minutes, 03-04 times a day for the first 2 to 3 days. After that, ice and heat may be alternated to reduce pain and spasms.  Ask your caregiver about trying back exercises and gentle massage. This may be  of some benefit.  Avoid feeling anxious or stressed.Stress increases muscle tension and can worsen back pain.It is important to recognize when you are anxious or stressed and learn ways to manage it.Exercise is a great option. SEEK MEDICAL CARE IF:  You have pain that is not relieved with rest or medicine.  You have pain that does not improve in 1 week.  You have new symptoms.  You are generally not feeling well. SEEK IMMEDIATE MEDICAL CARE IF:   You have pain that radiates from your back into your  legs.  You develop new bowel or bladder control problems.  You have unusual weakness or numbness in your arms or legs.  You develop nausea or vomiting.  You develop abdominal pain.  You feel faint. Document Released: 03/29/2005 Document Revised: 09/28/2011 Document Reviewed: 07/31/2013 [facility] Patient Information 2015 [place], [place]. This information is not intended to replace advice given to you by your health care provider. Make sure you discuss any questions you have with your health care provider.

## 2014-06-19 NOTE — Progress Notes (Signed)
Physical Therapy Treatment Patient Details Name: Jeffrey Randall MRN: [id number] DOB: Mar 18, 1941 Today's Date: 06/19/2014    History of Present Illness Patient is a 74 y/o male w/ PMH for HTN and DM2 who presents with 1 week of increasing back pain. Presented to [facility] ER due to pain- prescribed Oxycodone, didn't really help. Saw PCP on Wed, more opioids and steroids, again didn't really help.Presented to [facility] ED on 3/4. MRI at that time showed straightening of lumbar lordosis, preserved vertebral heights, and sever disc space narrowing at L5-S1. DC'ed home, returned on 3/7 due to persistent pain.     PT Comments    Patient progressing well with mobility. Pain seems more controlled today. Tolerated stair negotiation with Min guard A for safety. Encourage use of RW for balance/stability initially at home to decrease fall risk. Will continue to follow and progress as tolerated.    Follow Up Recommendations  Home health PT;Supervision/Assistance - 24 hour     Equipment Recommendations  Rolling walker with 5" wheels    Recommendations for Other Services       Precautions / Restrictions Precautions Precautions: Fall Restrictions Weight Bearing Restrictions: No    Mobility  Bed Mobility Overal bed mobility: Needs Assistance Bed Mobility: Rolling;Sidelying to Sit Rolling: Supervision Sidelying to sit: Supervision;HOB elevated       General bed mobility comments: Use of rails. Cues for log roll technique.   Transfers Overall transfer level: Needs assistance Equipment used: None Transfers: Sit to/from Stand Sit to Stand: Supervision         General transfer comment: Supervision for safety. Stood from [employer].   Ambulation/Gait Ambulation/Gait assistance: Supervision;Min assist Ambulation Distance (Feet): 510 Feet Assistive device: Rolling walker (2 wheeled);None Gait Pattern/deviations: Step-through pattern;Trunk flexed   Gait velocity interpretation: Below normal  speed for age/gender General Gait Details: Initially pt with unsteady gait requiring Min A due to drifting and LOB. Used RW for most of distance at S level. Encouraged use of RW for safety.   Stairs Stairs: Yes Stairs assistance: Min guard Stair Management: One rail Right;Step to pattern;Alternating pattern Number of Stairs: 12 General stair comments: Min guard for safety.   Wheelchair Mobility    Modified Rankin (Stroke Patients Only)       Balance Overall balance assessment: Needs assistance Sitting-balance support: Feet supported;No upper extremity supported Sitting balance-Leahy Scale: Good Sitting balance - Comments: Able to reach outside BoS and donn socks   Standing balance support: During functional activity Standing balance-Leahy Scale: Fair                      Cognition Arousal/Alertness: Awake/alert Behavior During Therapy: WFL for tasks assessed/performed Overall Cognitive Status: Within Functional Limits for tasks assessed                      Exercises      General Comments        Pertinent Vitals/Pain Pain Assessment: 0-10 Pain Score: 3  Pain Location: back Pain Descriptors / Indicators: Sore Pain Intervention(s): Monitored during session;Repositioned    Home Living                      Prior Function            PT Goals (current goals can now be found in the care plan section) Progress towards PT goals: Progressing toward goals    Frequency  Min 4X/week    PT Plan Current  plan remains appropriate    Co-evaluation             End of Session Equipment Utilized During Treatment: Gait belt Activity Tolerance: Patient tolerated treatment well Patient left: in bed;with call bell/phone within reach;with family/visitor present     Time: 1914-7829 PT Time Calculation (min) (ACUTE ONLY): 15 min  Charges:  $Gait Training: 8-22 mins                    G CodesCandy Sledge A 06/24/2014, 2:45 PM   Candy Sledge, [place], DPT [phone]

## 2014-06-23 LAB — CULTURE, BLOOD (ROUTINE X 2)
CULTURE: NO GROWTH
Culture: NO GROWTH

## 2014-07-15 ENCOUNTER — Other Ambulatory Visit: Payer: Self-pay | Admitting: Orthopedic Surgery

## 2014-07-15 DIAGNOSIS — M545 Low back pain, unspecified: Secondary | ICD-10-CM

## 2014-07-18 ENCOUNTER — Other Ambulatory Visit: Payer: Self-pay | Admitting: Orthopedic Surgery

## 2014-07-18 DIAGNOSIS — R634 Abnormal weight loss: Secondary | ICD-10-CM

## 2014-07-18 DIAGNOSIS — Z87891 Personal history of nicotine dependence: Secondary | ICD-10-CM

## 2014-07-18 DIAGNOSIS — R0602 Shortness of breath: Secondary | ICD-10-CM

## 2014-07-19 ENCOUNTER — Ambulatory Visit
Admission: RE | Admit: 2014-07-19 | Discharge: 2014-07-19 | Disposition: A | Discharge status: Home / Self Care | Payer: PPO | Source: Ambulatory Visit | Attending: Orthopedic Surgery | Admitting: Orthopedic Surgery

## 2014-07-19 DIAGNOSIS — R634 Abnormal weight loss: Secondary | ICD-10-CM

## 2014-07-19 DIAGNOSIS — R0602 Shortness of breath: Secondary | ICD-10-CM

## 2014-07-19 DIAGNOSIS — Z87891 Personal history of nicotine dependence: Secondary | ICD-10-CM

## 2014-07-19 MED ORDER — IOPAMIDOL (ISOVUE-300) INJECTION 61%
75.0000 mL | Freq: Once | INTRAVENOUS | Status: AC | PRN
  Administered 2014-07-19: 75 mL via INTRAVENOUS

## 2014-07-23 ENCOUNTER — Encounter: Payer: Self-pay | Admitting: Emergency Medicine

## 2014-07-23 ENCOUNTER — Ambulatory Visit (INDEPENDENT_AMBULATORY_CARE_PROVIDER_SITE_OTHER): Payer: PPO | Admitting: Emergency Medicine

## 2014-07-23 VITALS — BP 150/70 | HR 78 | Ht 71.0 in | Wt 177.0 lb

## 2014-07-23 DIAGNOSIS — R911 Solitary pulmonary nodule: Secondary | ICD-10-CM

## 2014-07-23 DIAGNOSIS — R918 Other nonspecific abnormal finding of lung field: Secondary | ICD-10-CM | POA: Insufficient documentation

## 2014-07-23 DIAGNOSIS — Z720 Tobacco use: Secondary | ICD-10-CM

## 2014-07-23 DIAGNOSIS — J449 Chronic obstructive pulmonary disease, unspecified: Secondary | ICD-10-CM

## 2014-07-23 DIAGNOSIS — F17200 Nicotine dependence, unspecified, uncomplicated: Secondary | ICD-10-CM

## 2014-07-23 HISTORY — DX: Chronic obstructive pulmonary disease, unspecified: J44.9

## 2014-07-23 HISTORY — DX: Other nonspecific abnormal finding of lung field: R91.8

## 2014-07-23 HISTORY — DX: Nicotine dependence, unspecified, uncomplicated: F17.200

## 2014-07-23 NOTE — Assessment & Plan Note (Signed)
I discussed cessation with him today. We worked on making a plan for him to cut down to a goal of 1 pack daily.

## 2014-07-23 NOTE — Patient Instructions (Signed)
We will repeat your CT scan of the chest in 6 months (October 2016) Work harder on decreasing her cigarette smoking. Set a goal of 1 pack a day, or 1.5 packs a day, and try to stick with this goal.  You will probably benefit from pulmonary function testing at some point in the future. Follow with Dr Lamonte Sakai in 6 months or sooner if you have any problems If you would like to see [place] sooner to perform breathing tests and to talk about your COPD then please call us and we will schedule you

## 2014-07-23 NOTE — Assessment & Plan Note (Signed)
Multiple small pulmonary nodules all less than 6 mm in a high risk patient. He has had about 15 pounds weight loss over 2 months. We discussed the different options and at this time I believe he should have a repeat CT scan in 6 months to look for interval change. He would be willing to pursue biopsy and treatment if we have evidence for malignancy.

## 2014-07-23 NOTE — Assessment & Plan Note (Signed)
Suspect that he does have COPD based on his morning cough although his exertional tolerance has not been limited. We will address this first with pulmonary function testing and then possibly bronchodilators at a future visit.

## 2014-07-23 NOTE — Progress Notes (Signed)
HPI:  74 yo active smoker (120 pk-yrs) with a history of hypertension and diabetes mellitus.  He is referred by Dr Rolena Infante for an abnormal Ct scan of the chest.  He was having some dry cough for over a year, was given an antibiotic during recent hospitalization and then again over the last 2 weeks, prompted a Ct scan on 07/19/14. He has lost about 15 lbs over the last 2 months. No hemoptysis. He does make phlegm in the am. He denies any dyspnea.      Past Medical History  Diagnosis Date  . Diabetes mellitus without complication   . Swelling   . Hypertension      No family history on file.   History   Social History  . Marital Status: Single    Spouse Name: N/A  . Number of Children: N/A  . Years of Education: N/A   Occupational History  . Not on file.   Social History Main Topics  . Smoking status: Current Every Day Smoker -- 2.00 packs/day for 60 years    Types: Cigarettes  . Smokeless tobacco: Never Used  . Alcohol Use: 0.0 oz/week    0 Standard drinks or equivalent per week  . Drug Use: No  . Sexual Activity: Not on file   Other Topics Concern  . Not on file   Social History Narrative     Allergies  Allergen Reactions  . Chantix Continuing Month [Varenicline] Other (See Comments)    Doesn't work for patient  . Penicillins Other (See Comments)    unknown     Outpatient Prescriptions Prior to Visit  Medication Sig Dispense Refill  . aspirin 81 MG tablet Take 81 mg by mouth daily.    . carvedilol (COREG) 3.125 MG tablet Take 3.125 mg by mouth 2 (two) times daily with a meal.    . DiphenhydrAMINE HCl (ALLERGY MED PO) Take 1 tablet by mouth as needed (for allergies).    . Folic Acid-Vit J6-BHA L93 (FOLBEE) 2.5-25-1 MG TABS tablet Take 1 tablet by mouth daily.    Marland Kitchen glimepiride (AMARYL) 2 MG tablet Take 2 mg by mouth daily with breakfast.     . losartan-hydrochlorothiazide (HYZAAR) 100-25 MG per tablet Take 1 tablet by mouth daily.    . metFORMIN (GLUCOPHAGE) 1000  MG tablet Take 1,000 mg by mouth 2 (two) times daily with a meal.     . Multiple Vitamins-Minerals (CENTRUM SILVER PO) Take 1 tablet by mouth daily.     . Omega-3 Fatty Acids (FISH OIL) 1200 MG CAPS Take 1,200 mg by mouth 2 (two) times daily.     Marland Kitchen oxyCODONE-acetaminophen (PERCOCET/ROXICET) 5-325 MG per tablet Take 1 tablet by mouth every 4 (four) hours as needed for severe pain.    . simvastatin (ZOCOR) 20 MG tablet Take 10 mg by mouth daily at 6 PM.     . traZODone (DESYREL) 50 MG tablet Take 50 mg by mouth at bedtime.    Marland Kitchen oxyCODONE-acetaminophen (ROXICET) 5-325 MG per tablet Take 2 tablets by mouth every 6 (six) hours as needed for severe pain (Take 1 or 2 tablets every six hours as needed for severe pain). 30 tablet 0  . tiZANidine (ZANAFLEX) 4 MG tablet Take 1 tablet (4 mg total) by mouth 3 (three) times daily. 30 tablet 0   No facility-administered medications prior to visit.   Filed Vitals:   07/23/14 1533 07/23/14 1534  BP:  150/70  Pulse:  78  Height: 5\' 11"  (1.803  m)   Weight: 177 lb (80.287 kg)   SpO2:  96%   Gen: Pleasant, thin, in no distress,  normal affect  ENT: No lesions,  mouth clear,  oropharynx clear, no postnasal drip  Neck: No JVD, no TMG, no carotid bruits  Lungs: No use of accessory muscles, clear without rales or rhonchi  Cardiovascular: RRR, heart sounds normal, no murmur or gallops, no peripheral edema  Musculoskeletal: No deformities, no cyanosis or clubbing  Neuro: alert, non focal  Skin: Warm, no lesions or rashes   Pulmonary nodules Multiple small pulmonary nodules all less than 6 mm in a high risk patient. He has had about 15 pounds weight loss over 2 months. We discussed the different options and at this time I believe he should have a repeat CT scan in 6 months to look for interval change. He would be willing to pursue biopsy and treatment if we have evidence for malignancy.    Tobacco use disorder I discussed cessation with him today. We  worked on making a plan for him to cut down to a goal of 1 pack daily.    COPD (chronic obstructive pulmonary disease) Suspect that he does have COPD based on his morning cough although his exertional tolerance has not been limited. We will address this first with pulmonary function testing and then possibly bronchodilators at a future visit.

## 2014-10-07 ENCOUNTER — Other Ambulatory Visit: Payer: Self-pay

## 2015-01-21 ENCOUNTER — Ambulatory Visit (INDEPENDENT_AMBULATORY_CARE_PROVIDER_SITE_OTHER)
Admission: RE | Admit: 2015-01-21 | Discharge: 2015-01-21 | Disposition: A | Discharge status: Home / Self Care | Payer: PPO | Source: Ambulatory Visit | Attending: Emergency Medicine | Admitting: Emergency Medicine

## 2015-01-21 DIAGNOSIS — R911 Solitary pulmonary nodule: Secondary | ICD-10-CM

## 2015-02-26 ENCOUNTER — Ambulatory Visit (INDEPENDENT_AMBULATORY_CARE_PROVIDER_SITE_OTHER): Payer: PPO | Admitting: Emergency Medicine

## 2015-02-26 ENCOUNTER — Encounter: Payer: Self-pay | Admitting: Emergency Medicine

## 2015-02-26 VITALS — BP 168/72 | HR 85 | Ht 71.0 in | Wt 162.0 lb

## 2015-02-26 DIAGNOSIS — F17200 Nicotine dependence, unspecified, uncomplicated: Secondary | ICD-10-CM | POA: Diagnosis not present

## 2015-02-26 DIAGNOSIS — R918 Other nonspecific abnormal finding of lung field: Secondary | ICD-10-CM

## 2015-02-26 DIAGNOSIS — J449 Chronic obstructive pulmonary disease, unspecified: Secondary | ICD-10-CM

## 2015-02-26 NOTE — Assessment & Plan Note (Signed)
Resolved on his CT scan of the chest from 01/2015. There is an area of linear atelectasis in the right upper lobe that may need follow-up. We will discuss this at his follow-up visit

## 2015-02-26 NOTE — Assessment & Plan Note (Signed)
Discussed cessation in detail today. He would like to stop but does not have a plan. At this time we will try cutting down to 1 pack daily as he was unable to do this after last visit

## 2015-02-26 NOTE — Patient Instructions (Addendum)
Your CT scan shows that your pulmonary nodules have resolved. This is good news.  There is evidence for emphysema present. The most important thing for you to do at this point is to cut down your cigarettes.  We will perform breathing tests at your follow up visit in 1 year.  Call office and follow up sooner if your breathing changes in any way

## 2015-02-26 NOTE — Progress Notes (Signed)
HPI:  74 yo active smoker (120 pk-yrs) with a history of hypertension and diabetes mellitus.  He is referred by Dr Rolena Infante for an abnormal Ct scan of the chest.  He was having some dry cough for over a year, was given an antibiotic during recent hospitalization and then again over the last 2 weeks, prompted a Ct scan on 07/19/14. He has lost about 15 lbs over the last 2 months. No hemoptysis. He does make phlegm in the am. He denies any dyspnea.   ROV 02/26/15 -- follow-up visit for tobacco abuse, presumed COPD, and pulmonary nodular disease noted on CT scan of the chest from 07/19/14. Repeat CT scan of the chest was performed on 01/21/15 personally reviewed. This showed interval resolution of his bilateral pulmonary nodules and some continued linear atelectatic change in the right upper lobe. At his last visit we agreed that he will make an attempt to cut his smoking down to 1 pack daily, he hasn't been able to do this.  He remains active, denies wheeze. Coughs in the am, prod of white.    No flowsheet data found.   Past Medical History  Diagnosis Date  . Diabetes mellitus without complication ([place])   . Swelling   . Hypertension      No family history on file.   Social History   Social History  . Marital Status: Single    Spouse Name: N/A  . Number of Children: N/A  . Years of Education: N/A   Occupational History  . Not on file.   Social History Main Topics  . Smoking status: Current Every Day Smoker -- 1.50 packs/day for 60 years    Types: Cigarettes  . Smokeless tobacco: Never Used  . Alcohol Use: 0.0 oz/week    0 Standard drinks or equivalent per week  . Drug Use: No  . Sexual Activity: Not on file   Other Topics Concern  . Not on file   Social History Narrative     Allergies  Allergen Reactions  . Chantix Continuing Month [Varenicline] Other (See Comments)    Doesn't work for patient  . Penicillins Other (See Comments)    unknown     Outpatient Prescriptions  Prior to Visit  Medication Sig Dispense Refill  . aspirin 81 MG tablet Take 81 mg by mouth daily.    . carvedilol (COREG) 3.125 MG tablet Take 3.125 mg by mouth 2 (two) times daily with a meal.    . DiphenhydrAMINE HCl (ALLERGY MED PO) Take 1 tablet by mouth as needed (for allergies).    . Folic Acid-Vit H6-DJS H70 (FOLBEE) 2.5-25-1 MG TABS tablet Take 1 tablet by mouth daily.    Marland Kitchen glimepiride (AMARYL) 2 MG tablet Take 2 mg by mouth daily with breakfast.     . losartan-hydrochlorothiazide (HYZAAR) 100-25 MG per tablet Take 1 tablet by mouth daily.    . metFORMIN (GLUCOPHAGE) 1000 MG tablet Take 1,000 mg by mouth 2 (two) times daily with a meal.     . Multiple Vitamins-Minerals (CENTRUM SILVER PO) Take 1 tablet by mouth daily.     . Omega-3 Fatty Acids (FISH OIL) 1200 MG CAPS Take 1,200 mg by mouth 2 (two) times daily.     Marland Kitchen oxyCODONE-acetaminophen (PERCOCET/ROXICET) 5-325 MG per tablet Take 1 tablet by mouth every 4 (four) hours as needed for severe pain.    . simvastatin (ZOCOR) 20 MG tablet Take 10 mg by mouth daily at 6 PM.     . traZODone (  DESYREL) 50 MG tablet Take 50 mg by mouth at bedtime.     No facility-administered medications prior to visit.   Filed Vitals:   02/26/15 1449  BP: 168/72  Pulse: 85  Height: '5\' 11"'$  (1.803 m)  Weight: 162 lb (73.483 kg)  SpO2: 95%   Gen: Pleasant, thin, in no distress,  normal affect  ENT: No lesions,  mouth clear,  oropharynx clear, no postnasal drip  Neck: No JVD, no TMG, no carotid bruits  Lungs: No use of accessory muscles, clear without rales or rhonchi  Cardiovascular: RRR, heart sounds normal, no murmur or gallops, no peripheral edema  Musculoskeletal: No deformities, no cyanosis or clubbing  Neuro: alert, non focal  Skin: Warm, no lesions or rashes   01/21/15 --  COMPARISON: CT 07/19/2014  FINDINGS: Mediastinum/Nodes: No axillary or supraclavicular lymphadenopathy. No mediastinal hilar lymphadenopathy. No pericardial  fluid. Aortic calcification noted.  Lungs/Pleura: Interval resolution of the pulmonary nodules described on comparison CT. There is centrilobular emphysema in the upper lobes. There is a focus of linear architecture distortion in the RIGHT upper lobe on image 11, series 3. No pleural fluid. Airways are normal. Send  Upper abdomen: Limited view of the liver, kidneys, pancreas are unremarkable. Normal adrenal glands. There is nodular enlargement of the LEFT adrenal gland which has low-density Hounsfield units consistent benign adenoma.  Musculoskeletal: No aggressive osseous lesion.  IMPRESSION: 1. Interval resolution of bilateral pulmonary nodules consistent with a benign etiology. 2. Moderate upper lobe centrilobular emphysema. 3. Architectural distortion in the RIGHT upper lobe. In patient with strong smoking history, recommend follow-up CT in 12 months.  Tobacco use disorder Discussed cessation in detail today. He would like to stop but does not have a plan. At this time we will try cutting down to 1 pack daily as he was unable to do this after last visit  Pulmonary nodules Resolved on his CT scan of the chest from 01/2015. There is an area of linear atelectasis in the right upper lobe that may need follow-up. We will discuss this at his follow-up visit  COPD (chronic obstructive pulmonary disease) Emphysematous change on CT scan and probable COPD. He is asymptomatic at this time. I believe the most important thing for him to do is stop smoking. I will perform pulmonary function testing at his next visit in 1 year

## 2015-02-26 NOTE — Assessment & Plan Note (Signed)
Emphysematous change on CT scan and probable COPD. He is asymptomatic at this time. I believe the most important thing for him to do is stop smoking. I will perform pulmonary function testing at his next visit in 1 year

## 2015-04-17 DIAGNOSIS — G542 Cervical root disorders, not elsewhere classified: Secondary | ICD-10-CM | POA: Diagnosis not present

## 2015-04-17 DIAGNOSIS — R630 Anorexia: Secondary | ICD-10-CM | POA: Diagnosis not present

## 2015-04-17 DIAGNOSIS — J209 Acute bronchitis, unspecified: Secondary | ICD-10-CM | POA: Diagnosis not present

## 2015-04-17 DIAGNOSIS — R202 Paresthesia of skin: Secondary | ICD-10-CM | POA: Diagnosis not present

## 2015-04-17 DIAGNOSIS — R1310 Dysphagia, unspecified: Secondary | ICD-10-CM | POA: Diagnosis not present

## 2015-04-24 DIAGNOSIS — K219 Gastro-esophageal reflux disease without esophagitis: Secondary | ICD-10-CM | POA: Diagnosis not present

## 2015-04-24 DIAGNOSIS — R1310 Dysphagia, unspecified: Secondary | ICD-10-CM | POA: Diagnosis not present

## 2015-04-24 DIAGNOSIS — J449 Chronic obstructive pulmonary disease, unspecified: Secondary | ICD-10-CM | POA: Diagnosis not present

## 2015-04-24 DIAGNOSIS — F329 Major depressive disorder, single episode, unspecified: Secondary | ICD-10-CM | POA: Diagnosis not present

## 2015-04-24 DIAGNOSIS — L301 Dyshidrosis [pompholyx]: Secondary | ICD-10-CM | POA: Diagnosis not present

## 2015-04-29 DIAGNOSIS — J309 Allergic rhinitis, unspecified: Secondary | ICD-10-CM | POA: Diagnosis not present

## 2015-06-23 DIAGNOSIS — E782 Mixed hyperlipidemia: Secondary | ICD-10-CM | POA: Diagnosis not present

## 2015-06-23 DIAGNOSIS — J984 Other disorders of lung: Secondary | ICD-10-CM | POA: Diagnosis not present

## 2015-06-23 DIAGNOSIS — I10 Essential (primary) hypertension: Secondary | ICD-10-CM | POA: Diagnosis not present

## 2015-06-23 DIAGNOSIS — E1129 Type 2 diabetes mellitus with other diabetic kidney complication: Secondary | ICD-10-CM | POA: Diagnosis not present

## 2015-10-06 DIAGNOSIS — I10 Essential (primary) hypertension: Secondary | ICD-10-CM | POA: Diagnosis not present

## 2015-10-06 DIAGNOSIS — Z1211 Encounter for screening for malignant neoplasm of colon: Secondary | ICD-10-CM | POA: Diagnosis not present

## 2015-10-06 DIAGNOSIS — E782 Mixed hyperlipidemia: Secondary | ICD-10-CM | POA: Diagnosis not present

## 2015-10-06 DIAGNOSIS — Z79899 Other long term (current) drug therapy: Secondary | ICD-10-CM | POA: Diagnosis not present

## 2015-10-06 DIAGNOSIS — D649 Anemia, unspecified: Secondary | ICD-10-CM | POA: Diagnosis not present

## 2015-10-06 DIAGNOSIS — E1143 Type 2 diabetes mellitus with diabetic autonomic (poly)neuropathy: Secondary | ICD-10-CM | POA: Diagnosis not present

## 2015-10-06 DIAGNOSIS — Z0000 Encounter for general adult medical examination without abnormal findings: Secondary | ICD-10-CM | POA: Diagnosis not present

## 2015-10-22 DIAGNOSIS — K869 Disease of pancreas, unspecified: Secondary | ICD-10-CM | POA: Diagnosis not present

## 2015-10-22 DIAGNOSIS — R634 Abnormal weight loss: Secondary | ICD-10-CM | POA: Diagnosis not present

## 2015-11-04 DIAGNOSIS — Z1211 Encounter for screening for malignant neoplasm of colon: Secondary | ICD-10-CM | POA: Diagnosis not present

## 2015-11-04 DIAGNOSIS — D122 Benign neoplasm of ascending colon: Secondary | ICD-10-CM | POA: Diagnosis not present

## 2015-11-04 DIAGNOSIS — Z8546 Personal history of malignant neoplasm of prostate: Secondary | ICD-10-CM | POA: Diagnosis not present

## 2015-11-04 DIAGNOSIS — Z79899 Other long term (current) drug therapy: Secondary | ICD-10-CM | POA: Diagnosis not present

## 2015-11-04 DIAGNOSIS — Z86010 Personal history of colonic polyps: Secondary | ICD-10-CM | POA: Diagnosis not present

## 2015-11-04 DIAGNOSIS — Z7982 Long term (current) use of aspirin: Secondary | ICD-10-CM | POA: Diagnosis not present

## 2015-11-04 DIAGNOSIS — D510 Vitamin B12 deficiency anemia due to intrinsic factor deficiency: Secondary | ICD-10-CM | POA: Diagnosis not present

## 2015-11-04 DIAGNOSIS — F17200 Nicotine dependence, unspecified, uncomplicated: Secondary | ICD-10-CM | POA: Diagnosis not present

## 2015-11-04 DIAGNOSIS — K624 Stenosis of anus and rectum: Secondary | ICD-10-CM | POA: Diagnosis not present

## 2015-11-04 DIAGNOSIS — E119 Type 2 diabetes mellitus without complications: Secondary | ICD-10-CM | POA: Diagnosis not present

## 2015-12-30 DIAGNOSIS — E1165 Type 2 diabetes mellitus with hyperglycemia: Secondary | ICD-10-CM | POA: Diagnosis not present

## 2016-01-07 DIAGNOSIS — D509 Iron deficiency anemia, unspecified: Secondary | ICD-10-CM | POA: Diagnosis not present

## 2016-01-07 DIAGNOSIS — E782 Mixed hyperlipidemia: Secondary | ICD-10-CM | POA: Diagnosis not present

## 2016-01-07 DIAGNOSIS — Z23 Encounter for immunization: Secondary | ICD-10-CM | POA: Diagnosis not present

## 2016-01-07 DIAGNOSIS — R630 Anorexia: Secondary | ICD-10-CM | POA: Diagnosis not present

## 2016-01-07 DIAGNOSIS — Z9181 History of falling: Secondary | ICD-10-CM | POA: Diagnosis not present

## 2016-01-07 DIAGNOSIS — E1165 Type 2 diabetes mellitus with hyperglycemia: Secondary | ICD-10-CM | POA: Diagnosis not present

## 2016-01-07 DIAGNOSIS — I10 Essential (primary) hypertension: Secondary | ICD-10-CM | POA: Diagnosis not present

## 2016-02-05 DIAGNOSIS — M109 Gout, unspecified: Secondary | ICD-10-CM | POA: Diagnosis not present

## 2016-02-05 DIAGNOSIS — N400 Enlarged prostate without lower urinary tract symptoms: Secondary | ICD-10-CM | POA: Diagnosis not present

## 2016-03-01 DIAGNOSIS — E1165 Type 2 diabetes mellitus with hyperglycemia: Secondary | ICD-10-CM | POA: Diagnosis not present

## 2016-03-08 DIAGNOSIS — E1165 Type 2 diabetes mellitus with hyperglycemia: Secondary | ICD-10-CM | POA: Diagnosis not present

## 2016-03-08 DIAGNOSIS — M109 Gout, unspecified: Secondary | ICD-10-CM | POA: Diagnosis not present

## 2016-03-08 DIAGNOSIS — D649 Anemia, unspecified: Secondary | ICD-10-CM | POA: Diagnosis not present

## 2016-03-08 DIAGNOSIS — H524 Presbyopia: Secondary | ICD-10-CM | POA: Diagnosis not present

## 2016-03-08 DIAGNOSIS — N529 Male erectile dysfunction, unspecified: Secondary | ICD-10-CM | POA: Diagnosis not present

## 2016-03-24 DIAGNOSIS — F17210 Nicotine dependence, cigarettes, uncomplicated: Secondary | ICD-10-CM | POA: Diagnosis not present

## 2016-03-24 DIAGNOSIS — Z87891 Personal history of nicotine dependence: Secondary | ICD-10-CM | POA: Diagnosis not present

## 2016-03-24 DIAGNOSIS — I2510 Atherosclerotic heart disease of native coronary artery without angina pectoris: Secondary | ICD-10-CM | POA: Diagnosis not present

## 2016-03-24 DIAGNOSIS — I700 Atherosclerosis of aorta: Secondary | ICD-10-CM | POA: Diagnosis not present

## 2016-03-24 DIAGNOSIS — N62 Hypertrophy of breast: Secondary | ICD-10-CM | POA: Diagnosis not present

## 2016-03-24 DIAGNOSIS — Z122 Encounter for screening for malignant neoplasm of respiratory organs: Secondary | ICD-10-CM | POA: Diagnosis not present

## 2016-05-25 DIAGNOSIS — E782 Mixed hyperlipidemia: Secondary | ICD-10-CM | POA: Diagnosis not present

## 2016-05-25 DIAGNOSIS — R1084 Generalized abdominal pain: Secondary | ICD-10-CM | POA: Diagnosis not present

## 2016-05-25 DIAGNOSIS — Z79899 Other long term (current) drug therapy: Secondary | ICD-10-CM | POA: Diagnosis not present

## 2016-05-25 DIAGNOSIS — E1165 Type 2 diabetes mellitus with hyperglycemia: Secondary | ICD-10-CM | POA: Diagnosis not present

## 2016-05-25 DIAGNOSIS — Z1389 Encounter for screening for other disorder: Secondary | ICD-10-CM | POA: Diagnosis not present

## 2016-06-02 DIAGNOSIS — K820 Obstruction of gallbladder: Secondary | ICD-10-CM | POA: Diagnosis not present

## 2016-06-02 DIAGNOSIS — R109 Unspecified abdominal pain: Secondary | ICD-10-CM | POA: Diagnosis not present

## 2016-06-08 DIAGNOSIS — E782 Mixed hyperlipidemia: Secondary | ICD-10-CM | POA: Diagnosis not present

## 2016-06-08 DIAGNOSIS — I10 Essential (primary) hypertension: Secondary | ICD-10-CM | POA: Diagnosis not present

## 2016-06-08 DIAGNOSIS — E1165 Type 2 diabetes mellitus with hyperglycemia: Secondary | ICD-10-CM | POA: Diagnosis not present

## 2016-07-06 DIAGNOSIS — I10 Essential (primary) hypertension: Secondary | ICD-10-CM | POA: Diagnosis not present

## 2016-07-06 DIAGNOSIS — E782 Mixed hyperlipidemia: Secondary | ICD-10-CM | POA: Diagnosis not present

## 2016-07-06 DIAGNOSIS — Z6824 Body mass index (BMI) 24.0-24.9, adult: Secondary | ICD-10-CM | POA: Diagnosis not present

## 2016-07-06 DIAGNOSIS — E1165 Type 2 diabetes mellitus with hyperglycemia: Secondary | ICD-10-CM | POA: Diagnosis not present

## 2016-12-08 DIAGNOSIS — M109 Gout, unspecified: Secondary | ICD-10-CM | POA: Diagnosis not present

## 2016-12-08 DIAGNOSIS — Z0000 Encounter for general adult medical examination without abnormal findings: Secondary | ICD-10-CM | POA: Diagnosis not present

## 2016-12-08 DIAGNOSIS — I10 Essential (primary) hypertension: Secondary | ICD-10-CM | POA: Diagnosis not present

## 2016-12-08 DIAGNOSIS — E1165 Type 2 diabetes mellitus with hyperglycemia: Secondary | ICD-10-CM | POA: Diagnosis not present

## 2016-12-30 IMAGING — MR MR LUMBAR SPINE WO/W CM
4 of 7 series · 19 of 48 positions shown · IV contrast (multihance)
Comparison: CT abdomen and pelvis 06/11/2014

CLINICAL DATA: Low back pain with anterior thigh tingling for 4
days. History of to remote back surgeries.

EXAM:
MRI LUMBAR SPINE WITHOUT AND WITH CONTRAST
TECHNIQUE: Multiplanar and multiecho pulse sequences of the lumbar spine were
obtained without and with intravenous contrast.
CONTRAST:  15mL MULTIHANCE GADOBENATE DIMEGLUMINE 529 MG/ML IV SOLN

[Series 3: T2 · sagittal · 5.0mm · 0.55mm/px · 3 of 14 slices shown (1 of 2)]
[im 1/14]
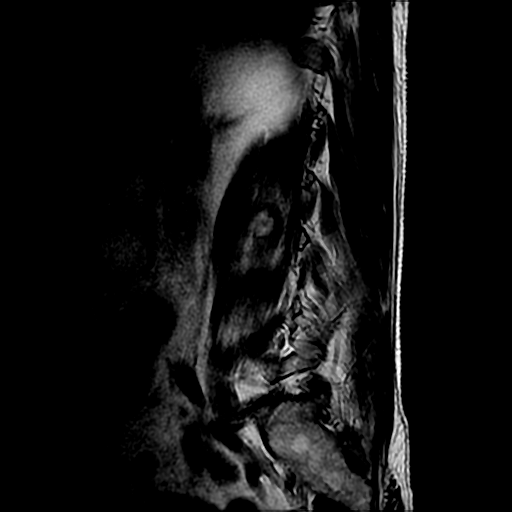
[im 7/14]
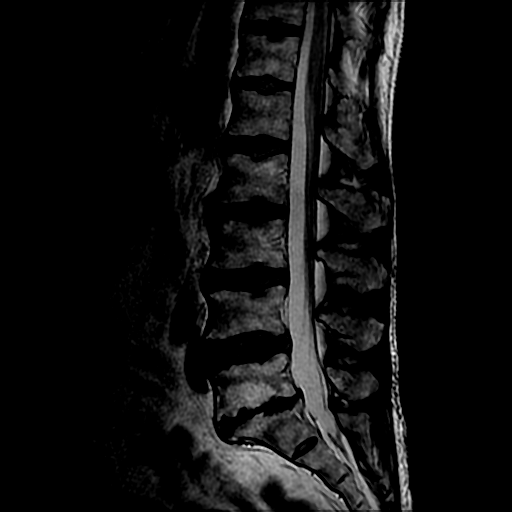
[im 14/14]
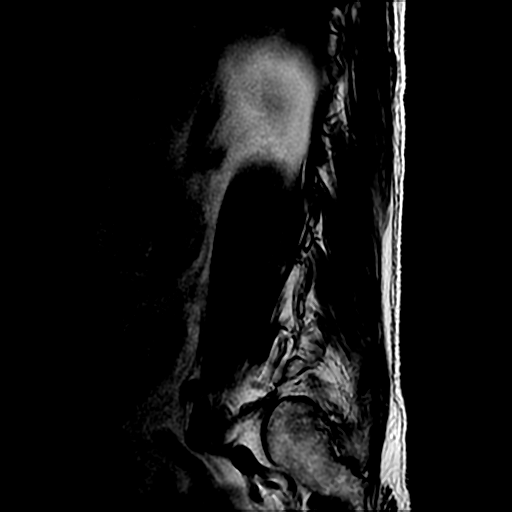

[Series 4: T1 · sagittal · 5.0mm · 0.55mm/px · 3 of 14 slices shown (1 of 2)]
[im 1/14]
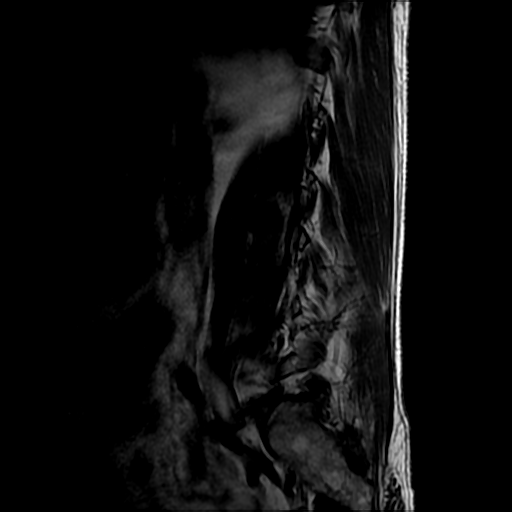
[im 9/14]
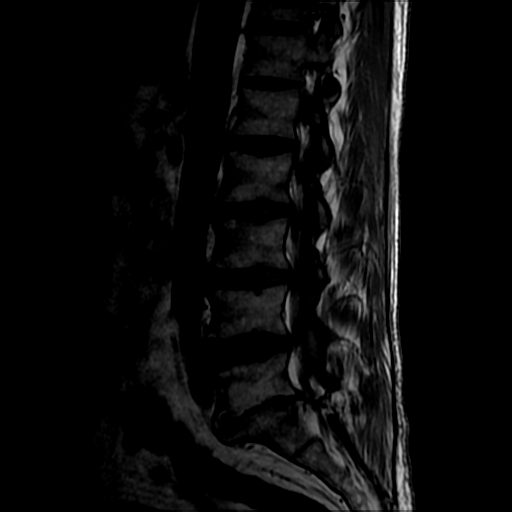
[im 14/14]
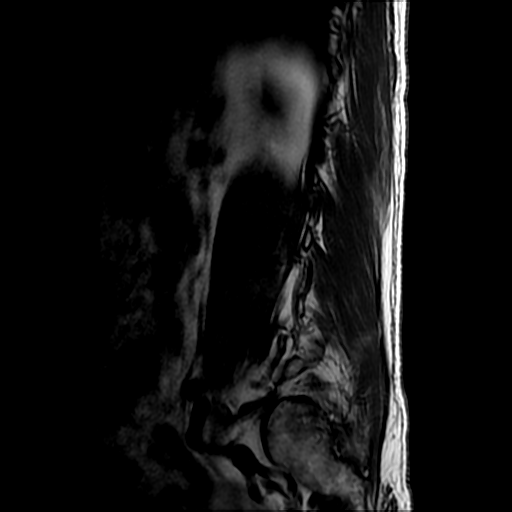

[Series 6: T2 · axial · 5.0mm · 0.39mm/px · z∈[-68,+129]mm · 10 of 40 slices shown (2 of 2)]
[im 1/40]
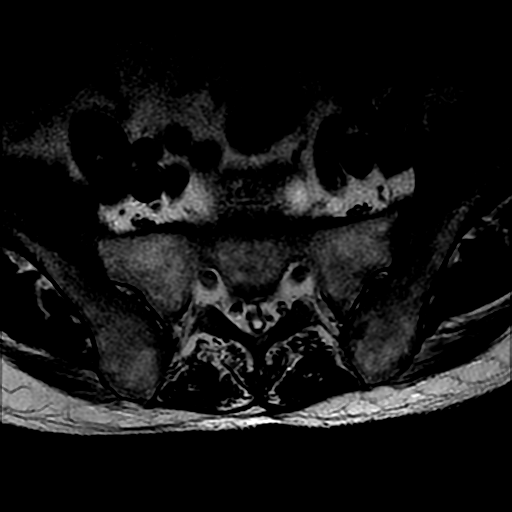
[im 4/40]
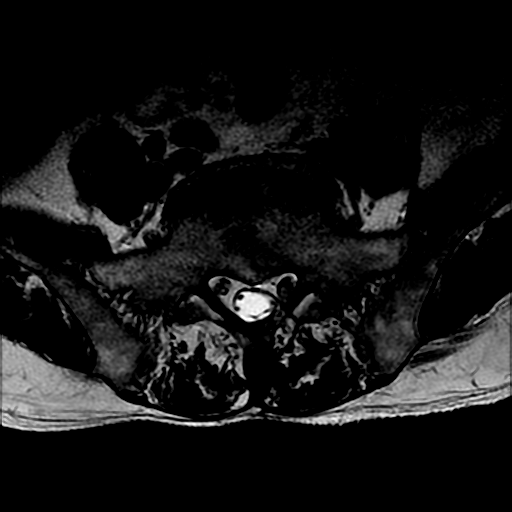
[im 8/40]
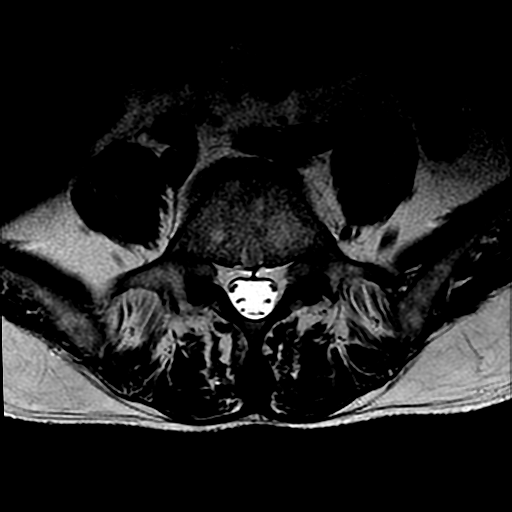
[im 12/40]
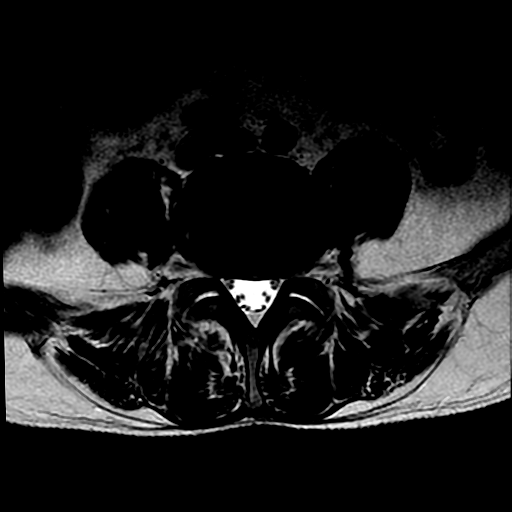
[im 16/40]
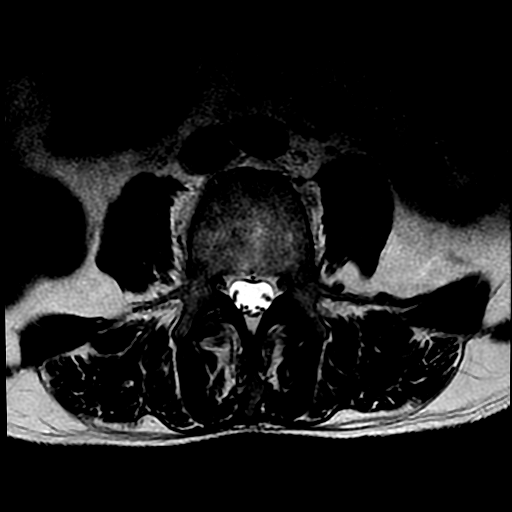
[im 20/40]
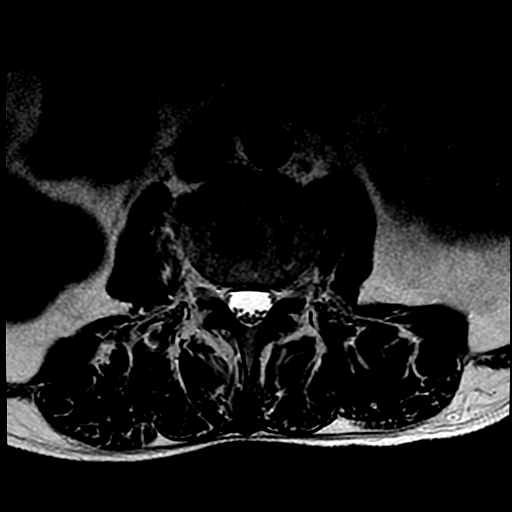
[im 24/40]
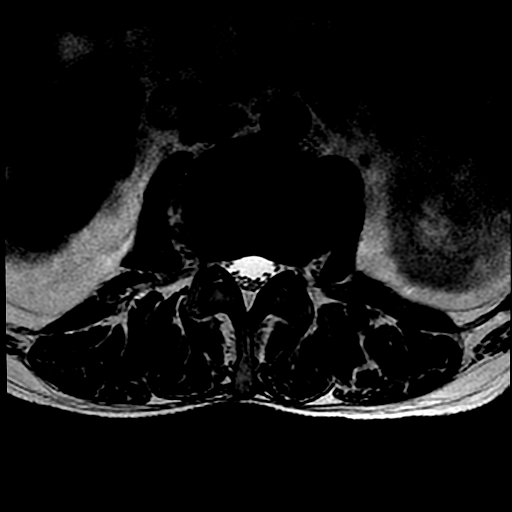
[im 28/40]
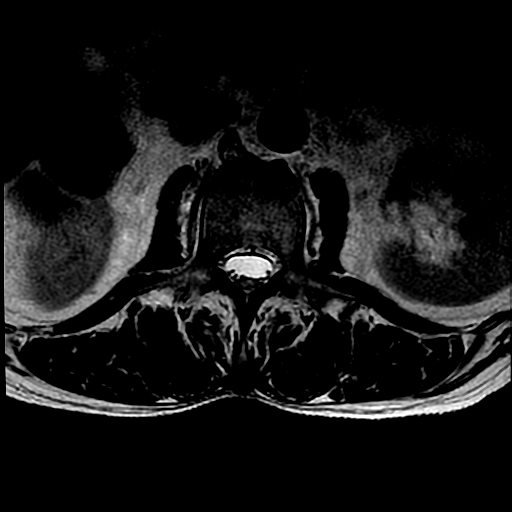
[im 32/40]
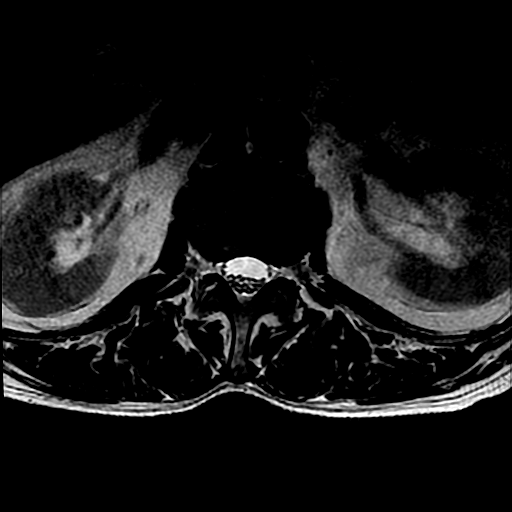
[im 36/40]
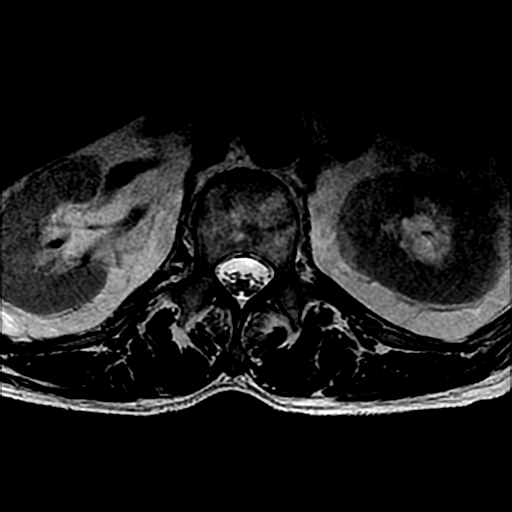

[Series 7: T1 · axial · 5.0mm · 0.39mm/px · z∈[-53,+129]mm · 3 of 40 slices shown (2 of 2)]
[im 4/40]
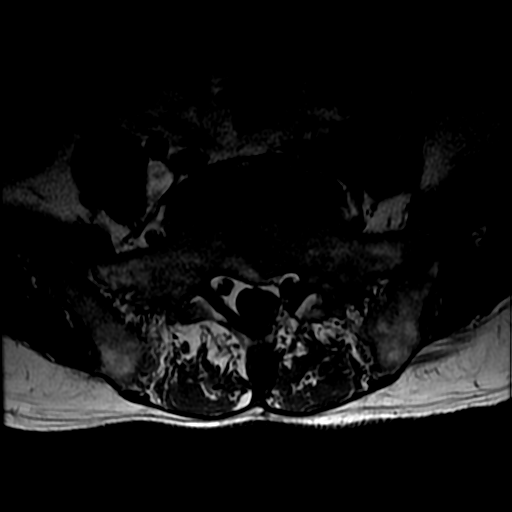
[im 20/40]
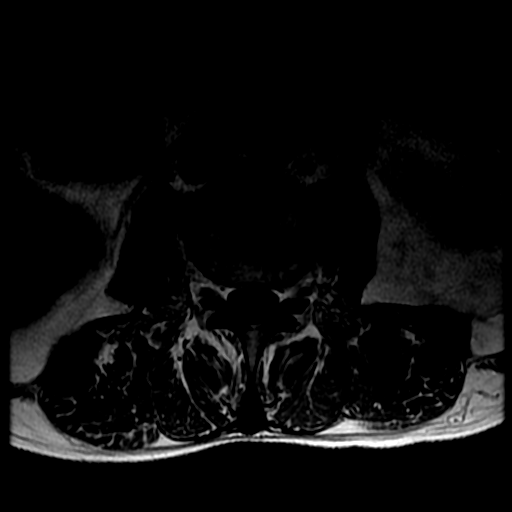
[im 36/40]
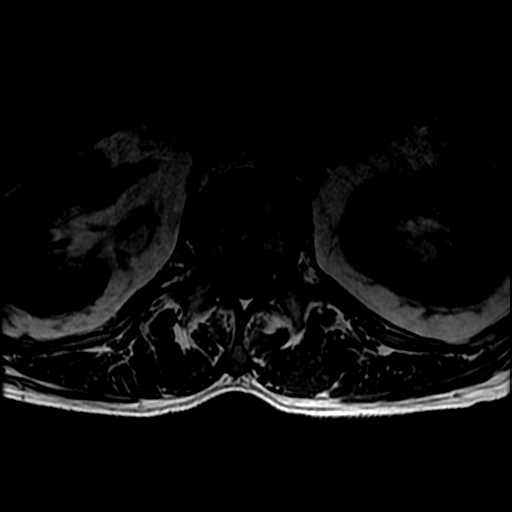

[19 of 48 positions shown; findings below may reference images not displayed]

FINDINGS: There is straightening of the normal lumbar lordosis. There is no
listhesis. Vertebral body heights are preserved. There is diffuse
lumbar disc desiccation. Severe disc space narrowing is present
L5-S1 with prominent Modic type 2 degenerative marrow changes. No
significant vertebral marrow edema is identified. There is no
abnormal enhancement. Scattered, small Schmorl's nodes are present.
Conus medullaris is normal in signal and terminates at L1. 1.5 cm
left adrenal nodule is unchanged.

L1-2:  Negative.

L2-3: Minimal disc bulging results in minimal bilateral neural
foraminal narrowing without spinal stenosis.

L3-4: Minimal disc bulging results in minimal bilateral neural
foraminal narrowing without spinal stenosis.

L4-5: Mild circumferential disc bulging and mild bilateral facet
hypertrophy result in mild bilateral lateral recess narrowing
without spinal canal or neural foraminal stenosis.

L5-S1: Severe disc space height loss and circumferential endplate
osteophytosis result in moderate bilateral neural foraminal stenosis
without spinal stenosis. Prior left laminectomy.
IMPRESSION: 1. Severe disc degeneration at L5-S1 resulting in moderate bilateral
neural foraminal stenosis. No spinal stenosis.
2. Mild disc and facet degeneration elsewhere as above without
evidence of neural impingement.

## 2017-05-06 DIAGNOSIS — E1165 Type 2 diabetes mellitus with hyperglycemia: Secondary | ICD-10-CM | POA: Diagnosis not present

## 2017-05-06 DIAGNOSIS — J441 Chronic obstructive pulmonary disease with (acute) exacerbation: Secondary | ICD-10-CM | POA: Diagnosis not present

## 2017-05-06 DIAGNOSIS — R05 Cough: Secondary | ICD-10-CM | POA: Diagnosis not present

## 2017-05-11 DIAGNOSIS — Z6824 Body mass index (BMI) 24.0-24.9, adult: Secondary | ICD-10-CM | POA: Diagnosis not present

## 2017-05-11 DIAGNOSIS — J209 Acute bronchitis, unspecified: Secondary | ICD-10-CM | POA: Diagnosis not present

## 2018-03-08 DIAGNOSIS — Z9181 History of falling: Secondary | ICD-10-CM | POA: Diagnosis not present

## 2018-03-08 DIAGNOSIS — R634 Abnormal weight loss: Secondary | ICD-10-CM | POA: Diagnosis not present

## 2018-03-08 DIAGNOSIS — Z0000 Encounter for general adult medical examination without abnormal findings: Secondary | ICD-10-CM | POA: Diagnosis not present

## 2018-03-08 DIAGNOSIS — Z9119 Patient's noncompliance with other medical treatment and regimen: Secondary | ICD-10-CM | POA: Diagnosis not present

## 2018-03-08 DIAGNOSIS — Z23 Encounter for immunization: Secondary | ICD-10-CM | POA: Diagnosis not present

## 2018-03-08 DIAGNOSIS — E782 Mixed hyperlipidemia: Secondary | ICD-10-CM | POA: Diagnosis not present

## 2018-03-08 DIAGNOSIS — E1142 Type 2 diabetes mellitus with diabetic polyneuropathy: Secondary | ICD-10-CM | POA: Diagnosis not present

## 2018-03-08 DIAGNOSIS — Z1331 Encounter for screening for depression: Secondary | ICD-10-CM | POA: Diagnosis not present

## 2018-03-08 DIAGNOSIS — M109 Gout, unspecified: Secondary | ICD-10-CM | POA: Diagnosis not present

## 2018-03-08 DIAGNOSIS — I10 Essential (primary) hypertension: Secondary | ICD-10-CM | POA: Diagnosis not present

## 2018-03-08 DIAGNOSIS — D510 Vitamin B12 deficiency anemia due to intrinsic factor deficiency: Secondary | ICD-10-CM | POA: Diagnosis not present

## 2018-03-08 DIAGNOSIS — Z6822 Body mass index (BMI) 22.0-22.9, adult: Secondary | ICD-10-CM | POA: Diagnosis not present

## 2018-03-13 DIAGNOSIS — M109 Gout, unspecified: Secondary | ICD-10-CM | POA: Diagnosis not present

## 2018-03-20 DIAGNOSIS — R634 Abnormal weight loss: Secondary | ICD-10-CM | POA: Diagnosis not present

## 2018-03-20 DIAGNOSIS — N400 Enlarged prostate without lower urinary tract symptoms: Secondary | ICD-10-CM | POA: Diagnosis not present

## 2018-03-20 DIAGNOSIS — M109 Gout, unspecified: Secondary | ICD-10-CM | POA: Diagnosis not present

## 2018-03-20 DIAGNOSIS — E1165 Type 2 diabetes mellitus with hyperglycemia: Secondary | ICD-10-CM | POA: Diagnosis not present

## 2018-03-20 DIAGNOSIS — E782 Mixed hyperlipidemia: Secondary | ICD-10-CM | POA: Diagnosis not present

## 2018-03-20 DIAGNOSIS — D649 Anemia, unspecified: Secondary | ICD-10-CM | POA: Diagnosis not present

## 2018-03-20 DIAGNOSIS — Z6822 Body mass index (BMI) 22.0-22.9, adult: Secondary | ICD-10-CM | POA: Diagnosis not present

## 2018-03-20 DIAGNOSIS — Z79899 Other long term (current) drug therapy: Secondary | ICD-10-CM | POA: Diagnosis not present

## 2018-05-24 DIAGNOSIS — Z6822 Body mass index (BMI) 22.0-22.9, adult: Secondary | ICD-10-CM | POA: Diagnosis not present

## 2018-05-24 DIAGNOSIS — R202 Paresthesia of skin: Secondary | ICD-10-CM | POA: Diagnosis not present

## 2018-05-24 DIAGNOSIS — R634 Abnormal weight loss: Secondary | ICD-10-CM | POA: Diagnosis not present

## 2018-05-24 DIAGNOSIS — D649 Anemia, unspecified: Secondary | ICD-10-CM | POA: Diagnosis not present

## 2018-05-24 DIAGNOSIS — E1165 Type 2 diabetes mellitus with hyperglycemia: Secondary | ICD-10-CM | POA: Diagnosis not present

## 2018-06-08 DIAGNOSIS — D649 Anemia, unspecified: Secondary | ICD-10-CM

## 2018-06-29 DIAGNOSIS — D649 Anemia, unspecified: Secondary | ICD-10-CM | POA: Diagnosis not present

## 2019-09-12 DIAGNOSIS — R0989 Other specified symptoms and signs involving the circulatory and respiratory systems: Secondary | ICD-10-CM | POA: Diagnosis not present

## 2019-09-12 DIAGNOSIS — I6523 Occlusion and stenosis of bilateral carotid arteries: Secondary | ICD-10-CM | POA: Diagnosis not present

## 2020-04-22 DIAGNOSIS — E1129 Type 2 diabetes mellitus with other diabetic kidney complication: Secondary | ICD-10-CM | POA: Diagnosis not present

## 2020-04-22 DIAGNOSIS — E782 Mixed hyperlipidemia: Secondary | ICD-10-CM | POA: Diagnosis not present

## 2020-04-22 DIAGNOSIS — Z6823 Body mass index (BMI) 23.0-23.9, adult: Secondary | ICD-10-CM | POA: Diagnosis not present

## 2020-04-22 DIAGNOSIS — Z9119 Patient's noncompliance with other medical treatment and regimen: Secondary | ICD-10-CM | POA: Diagnosis not present

## 2020-04-22 DIAGNOSIS — I10 Essential (primary) hypertension: Secondary | ICD-10-CM | POA: Diagnosis not present

## 2020-04-22 DIAGNOSIS — J984 Other disorders of lung: Secondary | ICD-10-CM | POA: Diagnosis not present

## 2020-04-22 DIAGNOSIS — R634 Abnormal weight loss: Secondary | ICD-10-CM | POA: Diagnosis not present

## 2020-08-26 DIAGNOSIS — E1129 Type 2 diabetes mellitus with other diabetic kidney complication: Secondary | ICD-10-CM | POA: Diagnosis not present

## 2020-08-26 DIAGNOSIS — Z0000 Encounter for general adult medical examination without abnormal findings: Secondary | ICD-10-CM | POA: Diagnosis not present

## 2020-08-26 DIAGNOSIS — E782 Mixed hyperlipidemia: Secondary | ICD-10-CM | POA: Diagnosis not present

## 2020-08-26 DIAGNOSIS — Z79899 Other long term (current) drug therapy: Secondary | ICD-10-CM | POA: Diagnosis not present

## 2020-08-26 DIAGNOSIS — Z6822 Body mass index (BMI) 22.0-22.9, adult: Secondary | ICD-10-CM | POA: Diagnosis not present

## 2020-09-09 DIAGNOSIS — R634 Abnormal weight loss: Secondary | ICD-10-CM | POA: Diagnosis not present

## 2020-09-09 DIAGNOSIS — N200 Calculus of kidney: Secondary | ICD-10-CM | POA: Diagnosis not present

## 2020-09-09 DIAGNOSIS — J90 Pleural effusion, not elsewhere classified: Secondary | ICD-10-CM | POA: Diagnosis not present

## 2020-09-09 DIAGNOSIS — J439 Emphysema, unspecified: Secondary | ICD-10-CM | POA: Diagnosis not present

## 2020-09-09 DIAGNOSIS — R911 Solitary pulmonary nodule: Secondary | ICD-10-CM | POA: Diagnosis not present

## 2020-09-09 DIAGNOSIS — K8020 Calculus of gallbladder without cholecystitis without obstruction: Secondary | ICD-10-CM | POA: Diagnosis not present

## 2020-09-09 DIAGNOSIS — K8080 Other cholelithiasis without obstruction: Secondary | ICD-10-CM | POA: Diagnosis not present

## 2020-09-09 DIAGNOSIS — K449 Diaphragmatic hernia without obstruction or gangrene: Secondary | ICD-10-CM | POA: Diagnosis not present

## 2020-09-17 DIAGNOSIS — F17210 Nicotine dependence, cigarettes, uncomplicated: Secondary | ICD-10-CM | POA: Diagnosis not present

## 2020-09-17 DIAGNOSIS — G4700 Insomnia, unspecified: Secondary | ICD-10-CM | POA: Diagnosis not present

## 2020-09-17 DIAGNOSIS — R911 Solitary pulmonary nodule: Secondary | ICD-10-CM | POA: Diagnosis not present

## 2020-09-17 DIAGNOSIS — J449 Chronic obstructive pulmonary disease, unspecified: Secondary | ICD-10-CM | POA: Diagnosis not present

## 2020-10-27 DIAGNOSIS — R911 Solitary pulmonary nodule: Secondary | ICD-10-CM | POA: Diagnosis not present

## 2021-01-06 DIAGNOSIS — G4700 Insomnia, unspecified: Secondary | ICD-10-CM | POA: Diagnosis not present

## 2021-01-06 DIAGNOSIS — F17210 Nicotine dependence, cigarettes, uncomplicated: Secondary | ICD-10-CM | POA: Diagnosis not present

## 2021-01-06 DIAGNOSIS — J449 Chronic obstructive pulmonary disease, unspecified: Secondary | ICD-10-CM | POA: Diagnosis not present

## 2021-01-06 DIAGNOSIS — R911 Solitary pulmonary nodule: Secondary | ICD-10-CM | POA: Diagnosis not present

## 2021-01-23 DIAGNOSIS — E119 Type 2 diabetes mellitus without complications: Secondary | ICD-10-CM | POA: Diagnosis not present

## 2021-01-23 DIAGNOSIS — R911 Solitary pulmonary nodule: Secondary | ICD-10-CM | POA: Diagnosis not present

## 2021-01-23 DIAGNOSIS — I10 Essential (primary) hypertension: Secondary | ICD-10-CM | POA: Diagnosis not present

## 2021-01-26 DIAGNOSIS — D491 Neoplasm of unspecified behavior of respiratory system: Secondary | ICD-10-CM | POA: Insufficient documentation

## 2021-01-26 HISTORY — DX: Neoplasm of unspecified behavior of respiratory system: D49.1

## 2021-01-28 DIAGNOSIS — C3411 Malignant neoplasm of upper lobe, right bronchus or lung: Secondary | ICD-10-CM | POA: Diagnosis not present

## 2021-01-28 DIAGNOSIS — R911 Solitary pulmonary nodule: Secondary | ICD-10-CM | POA: Diagnosis not present

## 2021-02-02 DIAGNOSIS — J984 Other disorders of lung: Secondary | ICD-10-CM | POA: Diagnosis not present

## 2021-02-02 DIAGNOSIS — F17210 Nicotine dependence, cigarettes, uncomplicated: Secondary | ICD-10-CM | POA: Diagnosis not present

## 2021-02-04 DIAGNOSIS — R911 Solitary pulmonary nodule: Secondary | ICD-10-CM | POA: Diagnosis not present

## 2021-02-05 DIAGNOSIS — R918 Other nonspecific abnormal finding of lung field: Secondary | ICD-10-CM | POA: Diagnosis not present

## 2021-02-12 DIAGNOSIS — D491 Neoplasm of unspecified behavior of respiratory system: Secondary | ICD-10-CM | POA: Diagnosis not present

## 2021-02-12 DIAGNOSIS — R911 Solitary pulmonary nodule: Secondary | ICD-10-CM | POA: Diagnosis not present

## 2021-02-12 DIAGNOSIS — F17210 Nicotine dependence, cigarettes, uncomplicated: Secondary | ICD-10-CM | POA: Diagnosis not present

## 2021-02-18 DIAGNOSIS — C3411 Malignant neoplasm of upper lobe, right bronchus or lung: Secondary | ICD-10-CM | POA: Diagnosis not present

## 2021-02-18 DIAGNOSIS — Z510 Encounter for antineoplastic radiation therapy: Secondary | ICD-10-CM | POA: Diagnosis not present

## 2021-02-18 DIAGNOSIS — D491 Neoplasm of unspecified behavior of respiratory system: Secondary | ICD-10-CM | POA: Diagnosis not present

## 2021-02-19 DIAGNOSIS — F17290 Nicotine dependence, other tobacco product, uncomplicated: Secondary | ICD-10-CM | POA: Diagnosis not present

## 2021-02-19 DIAGNOSIS — J411 Mucopurulent chronic bronchitis: Secondary | ICD-10-CM | POA: Diagnosis not present

## 2021-02-19 DIAGNOSIS — R001 Bradycardia, unspecified: Secondary | ICD-10-CM | POA: Diagnosis not present

## 2021-02-19 DIAGNOSIS — D491 Neoplasm of unspecified behavior of respiratory system: Secondary | ICD-10-CM | POA: Diagnosis not present

## 2021-02-27 DIAGNOSIS — D491 Neoplasm of unspecified behavior of respiratory system: Secondary | ICD-10-CM | POA: Diagnosis not present

## 2021-02-27 DIAGNOSIS — C3491 Malignant neoplasm of unspecified part of right bronchus or lung: Secondary | ICD-10-CM | POA: Diagnosis not present

## 2021-02-27 DIAGNOSIS — C3411 Malignant neoplasm of upper lobe, right bronchus or lung: Secondary | ICD-10-CM | POA: Diagnosis not present

## 2021-02-27 DIAGNOSIS — Z510 Encounter for antineoplastic radiation therapy: Secondary | ICD-10-CM | POA: Diagnosis not present

## 2021-03-09 DIAGNOSIS — C3411 Malignant neoplasm of upper lobe, right bronchus or lung: Secondary | ICD-10-CM | POA: Diagnosis not present

## 2021-03-09 DIAGNOSIS — D491 Neoplasm of unspecified behavior of respiratory system: Secondary | ICD-10-CM | POA: Diagnosis not present

## 2021-03-09 DIAGNOSIS — C3491 Malignant neoplasm of unspecified part of right bronchus or lung: Secondary | ICD-10-CM | POA: Diagnosis not present

## 2021-03-09 DIAGNOSIS — Z510 Encounter for antineoplastic radiation therapy: Secondary | ICD-10-CM | POA: Diagnosis not present

## 2021-03-10 DIAGNOSIS — D491 Neoplasm of unspecified behavior of respiratory system: Secondary | ICD-10-CM | POA: Diagnosis not present

## 2021-03-10 DIAGNOSIS — Z510 Encounter for antineoplastic radiation therapy: Secondary | ICD-10-CM | POA: Diagnosis not present

## 2021-03-10 DIAGNOSIS — C3411 Malignant neoplasm of upper lobe, right bronchus or lung: Secondary | ICD-10-CM | POA: Diagnosis not present

## 2021-03-11 DIAGNOSIS — Z510 Encounter for antineoplastic radiation therapy: Secondary | ICD-10-CM | POA: Diagnosis not present

## 2021-03-11 DIAGNOSIS — D491 Neoplasm of unspecified behavior of respiratory system: Secondary | ICD-10-CM | POA: Diagnosis not present

## 2021-03-11 DIAGNOSIS — C3411 Malignant neoplasm of upper lobe, right bronchus or lung: Secondary | ICD-10-CM | POA: Diagnosis not present

## 2021-03-12 DIAGNOSIS — C3411 Malignant neoplasm of upper lobe, right bronchus or lung: Secondary | ICD-10-CM | POA: Diagnosis not present

## 2021-03-12 DIAGNOSIS — D491 Neoplasm of unspecified behavior of respiratory system: Secondary | ICD-10-CM | POA: Diagnosis not present

## 2021-03-12 DIAGNOSIS — Z510 Encounter for antineoplastic radiation therapy: Secondary | ICD-10-CM | POA: Diagnosis not present

## 2021-03-13 DIAGNOSIS — C3411 Malignant neoplasm of upper lobe, right bronchus or lung: Secondary | ICD-10-CM | POA: Diagnosis not present

## 2021-03-13 DIAGNOSIS — D491 Neoplasm of unspecified behavior of respiratory system: Secondary | ICD-10-CM | POA: Diagnosis not present

## 2021-03-13 DIAGNOSIS — Z510 Encounter for antineoplastic radiation therapy: Secondary | ICD-10-CM | POA: Diagnosis not present

## 2021-04-08 DIAGNOSIS — R5381 Other malaise: Secondary | ICD-10-CM | POA: Diagnosis not present

## 2021-04-08 DIAGNOSIS — Z85118 Personal history of other malignant neoplasm of bronchus and lung: Secondary | ICD-10-CM | POA: Diagnosis not present

## 2021-04-08 DIAGNOSIS — R5383 Other fatigue: Secondary | ICD-10-CM | POA: Diagnosis not present

## 2021-04-08 DIAGNOSIS — Z6822 Body mass index (BMI) 22.0-22.9, adult: Secondary | ICD-10-CM | POA: Diagnosis not present

## 2021-04-08 DIAGNOSIS — E1165 Type 2 diabetes mellitus with hyperglycemia: Secondary | ICD-10-CM | POA: Diagnosis not present

## 2021-04-08 DIAGNOSIS — I10 Essential (primary) hypertension: Secondary | ICD-10-CM | POA: Diagnosis not present

## 2021-05-04 DIAGNOSIS — I10 Essential (primary) hypertension: Secondary | ICD-10-CM | POA: Diagnosis not present

## 2021-05-04 DIAGNOSIS — E782 Mixed hyperlipidemia: Secondary | ICD-10-CM | POA: Diagnosis not present

## 2021-05-04 DIAGNOSIS — Z6821 Body mass index (BMI) 21.0-21.9, adult: Secondary | ICD-10-CM | POA: Diagnosis not present

## 2021-05-04 DIAGNOSIS — E1165 Type 2 diabetes mellitus with hyperglycemia: Secondary | ICD-10-CM | POA: Diagnosis not present

## 2021-05-04 DIAGNOSIS — Z85118 Personal history of other malignant neoplasm of bronchus and lung: Secondary | ICD-10-CM | POA: Diagnosis not present

## 2021-06-04 DIAGNOSIS — Z85118 Personal history of other malignant neoplasm of bronchus and lung: Secondary | ICD-10-CM | POA: Diagnosis not present

## 2021-06-04 DIAGNOSIS — F17210 Nicotine dependence, cigarettes, uncomplicated: Secondary | ICD-10-CM | POA: Diagnosis not present

## 2021-06-04 DIAGNOSIS — D491 Neoplasm of unspecified behavior of respiratory system: Secondary | ICD-10-CM | POA: Diagnosis not present

## 2021-06-04 DIAGNOSIS — Z08 Encounter for follow-up examination after completed treatment for malignant neoplasm: Secondary | ICD-10-CM | POA: Diagnosis not present

## 2021-06-04 DIAGNOSIS — Z923 Personal history of irradiation: Secondary | ICD-10-CM | POA: Diagnosis not present

## 2021-06-04 DIAGNOSIS — Z902 Acquired absence of lung [part of]: Secondary | ICD-10-CM | POA: Diagnosis not present

## 2021-06-08 DIAGNOSIS — I6930 Unspecified sequelae of cerebral infarction: Secondary | ICD-10-CM | POA: Diagnosis not present

## 2021-06-08 DIAGNOSIS — K219 Gastro-esophageal reflux disease without esophagitis: Secondary | ICD-10-CM | POA: Diagnosis not present

## 2021-06-08 DIAGNOSIS — Z7952 Long term (current) use of systemic steroids: Secondary | ICD-10-CM | POA: Diagnosis not present

## 2021-06-08 DIAGNOSIS — Z7982 Long term (current) use of aspirin: Secondary | ICD-10-CM | POA: Diagnosis not present

## 2021-06-08 DIAGNOSIS — Z9114 Patient's other noncompliance with medication regimen: Secondary | ICD-10-CM | POA: Diagnosis not present

## 2021-06-08 DIAGNOSIS — J441 Chronic obstructive pulmonary disease with (acute) exacerbation: Secondary | ICD-10-CM | POA: Diagnosis not present

## 2021-06-08 DIAGNOSIS — N170 Acute kidney failure with tubular necrosis: Secondary | ICD-10-CM | POA: Diagnosis not present

## 2021-06-08 DIAGNOSIS — Z792 Long term (current) use of antibiotics: Secondary | ICD-10-CM | POA: Diagnosis not present

## 2021-06-08 DIAGNOSIS — Z880 Allergy status to penicillin: Secondary | ICD-10-CM | POA: Diagnosis not present

## 2021-06-08 DIAGNOSIS — F32A Depression, unspecified: Secondary | ICD-10-CM | POA: Diagnosis not present

## 2021-06-08 DIAGNOSIS — F17210 Nicotine dependence, cigarettes, uncomplicated: Secondary | ICD-10-CM | POA: Diagnosis not present

## 2021-06-08 DIAGNOSIS — J449 Chronic obstructive pulmonary disease, unspecified: Secondary | ICD-10-CM | POA: Diagnosis not present

## 2021-06-08 DIAGNOSIS — M1990 Unspecified osteoarthritis, unspecified site: Secondary | ICD-10-CM | POA: Diagnosis not present

## 2021-06-08 DIAGNOSIS — E039 Hypothyroidism, unspecified: Secondary | ICD-10-CM | POA: Diagnosis not present

## 2021-06-08 DIAGNOSIS — E860 Dehydration: Secondary | ICD-10-CM | POA: Diagnosis not present

## 2021-06-08 DIAGNOSIS — Z7984 Long term (current) use of oral hypoglycemic drugs: Secondary | ICD-10-CM | POA: Diagnosis not present

## 2021-06-08 DIAGNOSIS — E7800 Pure hypercholesterolemia, unspecified: Secondary | ICD-10-CM | POA: Diagnosis not present

## 2021-06-08 DIAGNOSIS — Z79899 Other long term (current) drug therapy: Secondary | ICD-10-CM | POA: Diagnosis not present

## 2021-06-08 DIAGNOSIS — I160 Hypertensive urgency: Secondary | ICD-10-CM | POA: Diagnosis not present

## 2021-06-08 DIAGNOSIS — E1165 Type 2 diabetes mellitus with hyperglycemia: Secondary | ICD-10-CM | POA: Diagnosis not present

## 2021-06-08 DIAGNOSIS — I10 Essential (primary) hypertension: Secondary | ICD-10-CM | POA: Diagnosis not present

## 2021-06-09 DIAGNOSIS — I6930 Unspecified sequelae of cerebral infarction: Secondary | ICD-10-CM | POA: Diagnosis not present

## 2021-06-09 DIAGNOSIS — E039 Hypothyroidism, unspecified: Secondary | ICD-10-CM | POA: Diagnosis not present

## 2021-06-12 DIAGNOSIS — Z794 Long term (current) use of insulin: Secondary | ICD-10-CM | POA: Diagnosis not present

## 2021-06-12 DIAGNOSIS — E1165 Type 2 diabetes mellitus with hyperglycemia: Secondary | ICD-10-CM | POA: Diagnosis not present

## 2021-06-12 DIAGNOSIS — E782 Mixed hyperlipidemia: Secondary | ICD-10-CM | POA: Diagnosis not present

## 2021-06-12 DIAGNOSIS — J984 Other disorders of lung: Secondary | ICD-10-CM | POA: Diagnosis not present

## 2021-06-12 DIAGNOSIS — Z6821 Body mass index (BMI) 21.0-21.9, adult: Secondary | ICD-10-CM | POA: Diagnosis not present

## 2021-06-12 DIAGNOSIS — I10 Essential (primary) hypertension: Secondary | ICD-10-CM | POA: Diagnosis not present

## 2021-07-10 DIAGNOSIS — I10 Essential (primary) hypertension: Secondary | ICD-10-CM | POA: Diagnosis not present

## 2021-07-10 DIAGNOSIS — E782 Mixed hyperlipidemia: Secondary | ICD-10-CM | POA: Diagnosis not present

## 2021-08-07 DIAGNOSIS — E1165 Type 2 diabetes mellitus with hyperglycemia: Secondary | ICD-10-CM | POA: Diagnosis not present

## 2021-08-09 DIAGNOSIS — I10 Essential (primary) hypertension: Secondary | ICD-10-CM | POA: Diagnosis not present

## 2021-08-09 DIAGNOSIS — E782 Mixed hyperlipidemia: Secondary | ICD-10-CM | POA: Diagnosis not present

## 2021-08-26 DIAGNOSIS — E1165 Type 2 diabetes mellitus with hyperglycemia: Secondary | ICD-10-CM | POA: Diagnosis not present

## 2021-08-28 DIAGNOSIS — Z0000 Encounter for general adult medical examination without abnormal findings: Secondary | ICD-10-CM | POA: Diagnosis not present

## 2021-08-28 DIAGNOSIS — E785 Hyperlipidemia, unspecified: Secondary | ICD-10-CM | POA: Diagnosis not present

## 2021-08-28 DIAGNOSIS — E1165 Type 2 diabetes mellitus with hyperglycemia: Secondary | ICD-10-CM | POA: Diagnosis not present

## 2021-08-28 DIAGNOSIS — Z6820 Body mass index (BMI) 20.0-20.9, adult: Secondary | ICD-10-CM | POA: Diagnosis not present

## 2021-08-28 DIAGNOSIS — Z79899 Other long term (current) drug therapy: Secondary | ICD-10-CM | POA: Diagnosis not present

## 2021-09-06 DIAGNOSIS — E1165 Type 2 diabetes mellitus with hyperglycemia: Secondary | ICD-10-CM | POA: Diagnosis not present

## 2021-09-09 DIAGNOSIS — E1165 Type 2 diabetes mellitus with hyperglycemia: Secondary | ICD-10-CM | POA: Diagnosis not present

## 2021-09-09 DIAGNOSIS — I10 Essential (primary) hypertension: Secondary | ICD-10-CM | POA: Diagnosis not present

## 2021-09-09 DIAGNOSIS — E782 Mixed hyperlipidemia: Secondary | ICD-10-CM | POA: Diagnosis not present

## 2021-09-16 DIAGNOSIS — R0602 Shortness of breath: Secondary | ICD-10-CM | POA: Diagnosis not present

## 2021-09-16 DIAGNOSIS — D491 Neoplasm of unspecified behavior of respiratory system: Secondary | ICD-10-CM | POA: Diagnosis not present

## 2021-09-16 DIAGNOSIS — C3411 Malignant neoplasm of upper lobe, right bronchus or lung: Secondary | ICD-10-CM | POA: Diagnosis not present

## 2021-09-16 DIAGNOSIS — F17200 Nicotine dependence, unspecified, uncomplicated: Secondary | ICD-10-CM | POA: Diagnosis not present

## 2021-09-17 DIAGNOSIS — E1165 Type 2 diabetes mellitus with hyperglycemia: Secondary | ICD-10-CM | POA: Diagnosis not present

## 2021-09-17 DIAGNOSIS — D649 Anemia, unspecified: Secondary | ICD-10-CM | POA: Diagnosis not present

## 2021-10-06 DIAGNOSIS — E1165 Type 2 diabetes mellitus with hyperglycemia: Secondary | ICD-10-CM | POA: Diagnosis not present

## 2021-10-09 DIAGNOSIS — E1165 Type 2 diabetes mellitus with hyperglycemia: Secondary | ICD-10-CM | POA: Diagnosis not present

## 2021-10-09 DIAGNOSIS — E782 Mixed hyperlipidemia: Secondary | ICD-10-CM | POA: Diagnosis not present

## 2021-10-09 DIAGNOSIS — I10 Essential (primary) hypertension: Secondary | ICD-10-CM | POA: Diagnosis not present

## 2021-10-22 DIAGNOSIS — I129 Hypertensive chronic kidney disease with stage 1 through stage 4 chronic kidney disease, or unspecified chronic kidney disease: Secondary | ICD-10-CM | POA: Diagnosis not present

## 2021-10-22 DIAGNOSIS — E1122 Type 2 diabetes mellitus with diabetic chronic kidney disease: Secondary | ICD-10-CM | POA: Diagnosis not present

## 2021-10-22 DIAGNOSIS — N1832 Chronic kidney disease, stage 3b: Secondary | ICD-10-CM | POA: Diagnosis not present

## 2021-10-26 ENCOUNTER — Other Ambulatory Visit: Payer: Self-pay | Admitting: Nephrology

## 2021-10-26 DIAGNOSIS — H52223 Regular astigmatism, bilateral: Secondary | ICD-10-CM | POA: Diagnosis not present

## 2021-10-26 DIAGNOSIS — H2513 Age-related nuclear cataract, bilateral: Secondary | ICD-10-CM | POA: Diagnosis not present

## 2021-10-26 DIAGNOSIS — H35373 Puckering of macula, bilateral: Secondary | ICD-10-CM | POA: Diagnosis not present

## 2021-10-26 DIAGNOSIS — E119 Type 2 diabetes mellitus without complications: Secondary | ICD-10-CM | POA: Diagnosis not present

## 2021-10-26 DIAGNOSIS — H524 Presbyopia: Secondary | ICD-10-CM | POA: Diagnosis not present

## 2021-10-26 DIAGNOSIS — H5203 Hypermetropia, bilateral: Secondary | ICD-10-CM | POA: Diagnosis not present

## 2021-10-26 DIAGNOSIS — N1832 Chronic kidney disease, stage 3b: Secondary | ICD-10-CM

## 2021-11-05 DIAGNOSIS — E1165 Type 2 diabetes mellitus with hyperglycemia: Secondary | ICD-10-CM | POA: Diagnosis not present

## 2021-11-09 ENCOUNTER — Ambulatory Visit
Admission: RE | Admit: 2021-11-09 | Discharge: 2021-11-09 | Disposition: A | Discharge status: Home / Self Care | Payer: Medicare Other | Source: Ambulatory Visit | Attending: Nephrology | Admitting: Nephrology

## 2021-11-09 DIAGNOSIS — N3289 Other specified disorders of bladder: Secondary | ICD-10-CM | POA: Diagnosis not present

## 2021-11-09 DIAGNOSIS — N1832 Chronic kidney disease, stage 3b: Secondary | ICD-10-CM

## 2021-11-09 DIAGNOSIS — N183 Chronic kidney disease, stage 3 unspecified: Secondary | ICD-10-CM | POA: Diagnosis not present

## 2021-12-05 DIAGNOSIS — E1165 Type 2 diabetes mellitus with hyperglycemia: Secondary | ICD-10-CM | POA: Diagnosis not present

## 2021-12-10 DIAGNOSIS — I10 Essential (primary) hypertension: Secondary | ICD-10-CM | POA: Diagnosis not present

## 2021-12-10 DIAGNOSIS — E782 Mixed hyperlipidemia: Secondary | ICD-10-CM | POA: Diagnosis not present

## 2021-12-10 DIAGNOSIS — E1165 Type 2 diabetes mellitus with hyperglycemia: Secondary | ICD-10-CM | POA: Diagnosis not present

## 2021-12-15 DIAGNOSIS — I129 Hypertensive chronic kidney disease with stage 1 through stage 4 chronic kidney disease, or unspecified chronic kidney disease: Secondary | ICD-10-CM | POA: Diagnosis not present

## 2021-12-15 DIAGNOSIS — R609 Edema, unspecified: Secondary | ICD-10-CM | POA: Diagnosis not present

## 2021-12-15 DIAGNOSIS — N1832 Chronic kidney disease, stage 3b: Secondary | ICD-10-CM | POA: Diagnosis not present

## 2021-12-15 DIAGNOSIS — R809 Proteinuria, unspecified: Secondary | ICD-10-CM | POA: Diagnosis not present

## 2021-12-15 DIAGNOSIS — E1122 Type 2 diabetes mellitus with diabetic chronic kidney disease: Secondary | ICD-10-CM | POA: Diagnosis not present

## 2022-01-04 DIAGNOSIS — E1165 Type 2 diabetes mellitus with hyperglycemia: Secondary | ICD-10-CM | POA: Diagnosis not present

## 2022-01-09 DIAGNOSIS — E1165 Type 2 diabetes mellitus with hyperglycemia: Secondary | ICD-10-CM | POA: Diagnosis not present

## 2022-01-09 DIAGNOSIS — E782 Mixed hyperlipidemia: Secondary | ICD-10-CM | POA: Diagnosis not present

## 2022-01-09 DIAGNOSIS — I10 Essential (primary) hypertension: Secondary | ICD-10-CM | POA: Diagnosis not present

## 2022-01-19 DIAGNOSIS — I129 Hypertensive chronic kidney disease with stage 1 through stage 4 chronic kidney disease, or unspecified chronic kidney disease: Secondary | ICD-10-CM | POA: Diagnosis not present

## 2022-01-19 DIAGNOSIS — E1122 Type 2 diabetes mellitus with diabetic chronic kidney disease: Secondary | ICD-10-CM | POA: Diagnosis not present

## 2022-01-19 DIAGNOSIS — R609 Edema, unspecified: Secondary | ICD-10-CM | POA: Diagnosis not present

## 2022-01-19 DIAGNOSIS — R809 Proteinuria, unspecified: Secondary | ICD-10-CM | POA: Diagnosis not present

## 2022-01-19 DIAGNOSIS — N1832 Chronic kidney disease, stage 3b: Secondary | ICD-10-CM | POA: Diagnosis not present

## 2022-02-06 DIAGNOSIS — E1165 Type 2 diabetes mellitus with hyperglycemia: Secondary | ICD-10-CM | POA: Diagnosis not present

## 2022-03-09 DIAGNOSIS — E1165 Type 2 diabetes mellitus with hyperglycemia: Secondary | ICD-10-CM | POA: Diagnosis not present

## 2022-03-11 DIAGNOSIS — J40 Bronchitis, not specified as acute or chronic: Secondary | ICD-10-CM | POA: Diagnosis not present

## 2022-03-11 DIAGNOSIS — J329 Chronic sinusitis, unspecified: Secondary | ICD-10-CM | POA: Diagnosis not present

## 2022-03-11 DIAGNOSIS — N189 Chronic kidney disease, unspecified: Secondary | ICD-10-CM | POA: Diagnosis not present

## 2022-03-18 ENCOUNTER — Telehealth: Payer: Self-pay

## 2022-03-18 NOTE — Patient Outreach (Signed)
  Care Coordination   03/18/2022 Name: Jeffrey Randall. MRN: [id number] DOB: 04-Oct-1940   Care Coordination Outreach Attempts:  An unsuccessful telephone outreach was attempted today to offer the patient information about available care coordination services as a benefit of their health plan.   Follow Up Plan:  Additional outreach attempts will be made to offer the patient care coordination information and services.   Encounter Outcome:  No Answer   Care Coordination Interventions:  No, not indicated   Tomasa Rand, RN, BSN, [facility] [employer] [phone]

## 2022-03-19 ENCOUNTER — Telehealth: Payer: Self-pay

## 2022-03-19 NOTE — Patient Outreach (Signed)
  Care Coordination   03/19/2022 Name: Bridget Westbrooks. MRN: [id number] DOB: 1941-03-08   Care Coordination Outreach Attempts:  A second unsuccessful outreach was attempted today to offer the patient with information about available care coordination services as a benefit of their health plan.     Follow Up Plan:  Additional outreach attempts will be made to offer the patient care coordination information and services.   Encounter Outcome:  No Answer   Care Coordination Interventions:  No, not indicated    Tomasa Rand, RN, BSN, [facility] [employer] [phone]

## 2022-03-29 DIAGNOSIS — I10 Essential (primary) hypertension: Secondary | ICD-10-CM | POA: Diagnosis not present

## 2022-03-29 DIAGNOSIS — I7090 Unspecified atherosclerosis: Secondary | ICD-10-CM | POA: Diagnosis not present

## 2022-03-29 DIAGNOSIS — F17200 Nicotine dependence, unspecified, uncomplicated: Secondary | ICD-10-CM | POA: Diagnosis not present

## 2022-03-29 DIAGNOSIS — Z23 Encounter for immunization: Secondary | ICD-10-CM | POA: Diagnosis not present

## 2022-03-29 DIAGNOSIS — E782 Mixed hyperlipidemia: Secondary | ICD-10-CM | POA: Diagnosis not present

## 2022-03-29 DIAGNOSIS — N189 Chronic kidney disease, unspecified: Secondary | ICD-10-CM | POA: Diagnosis not present

## 2022-03-29 DIAGNOSIS — E1122 Type 2 diabetes mellitus with diabetic chronic kidney disease: Secondary | ICD-10-CM | POA: Diagnosis not present

## 2022-03-29 DIAGNOSIS — I739 Peripheral vascular disease, unspecified: Secondary | ICD-10-CM | POA: Diagnosis not present

## 2022-03-29 DIAGNOSIS — Z6821 Body mass index (BMI) 21.0-21.9, adult: Secondary | ICD-10-CM | POA: Diagnosis not present

## 2022-04-08 DIAGNOSIS — E1165 Type 2 diabetes mellitus with hyperglycemia: Secondary | ICD-10-CM | POA: Diagnosis not present

## 2022-04-11 DIAGNOSIS — E782 Mixed hyperlipidemia: Secondary | ICD-10-CM | POA: Diagnosis not present

## 2022-04-11 DIAGNOSIS — I10 Essential (primary) hypertension: Secondary | ICD-10-CM | POA: Diagnosis not present

## 2022-04-11 DIAGNOSIS — E1165 Type 2 diabetes mellitus with hyperglycemia: Secondary | ICD-10-CM | POA: Diagnosis not present

## 2022-04-15 ENCOUNTER — Encounter: Payer: Self-pay | Admitting: Cardiology

## 2022-04-15 ENCOUNTER — Encounter: Payer: Self-pay | Admitting: *Deleted

## 2022-04-22 DIAGNOSIS — R609 Edema, unspecified: Secondary | ICD-10-CM | POA: Diagnosis not present

## 2022-04-22 DIAGNOSIS — E1122 Type 2 diabetes mellitus with diabetic chronic kidney disease: Secondary | ICD-10-CM | POA: Diagnosis not present

## 2022-04-22 DIAGNOSIS — R809 Proteinuria, unspecified: Secondary | ICD-10-CM | POA: Diagnosis not present

## 2022-04-22 DIAGNOSIS — N390 Urinary tract infection, site not specified: Secondary | ICD-10-CM | POA: Diagnosis not present

## 2022-04-22 DIAGNOSIS — I129 Hypertensive chronic kidney disease with stage 1 through stage 4 chronic kidney disease, or unspecified chronic kidney disease: Secondary | ICD-10-CM | POA: Diagnosis not present

## 2022-04-22 DIAGNOSIS — N1832 Chronic kidney disease, stage 3b: Secondary | ICD-10-CM | POA: Diagnosis not present

## 2022-04-27 ENCOUNTER — Other Ambulatory Visit (HOSPITAL_COMMUNITY): Payer: Self-pay | Admitting: Nephrology

## 2022-04-27 DIAGNOSIS — R809 Proteinuria, unspecified: Secondary | ICD-10-CM

## 2022-05-03 ENCOUNTER — Encounter: Payer: Self-pay | Admitting: Cardiology

## 2022-05-03 ENCOUNTER — Ambulatory Visit: Payer: Medicare Other | Attending: Cardiology | Admitting: Cardiology

## 2022-05-03 VITALS — BP 156/70 | HR 78 | Ht 70.0 in | Wt 154.0 lb

## 2022-05-03 DIAGNOSIS — E119 Type 2 diabetes mellitus without complications: Secondary | ICD-10-CM

## 2022-05-03 DIAGNOSIS — I10 Essential (primary) hypertension: Secondary | ICD-10-CM

## 2022-05-03 DIAGNOSIS — J449 Chronic obstructive pulmonary disease, unspecified: Secondary | ICD-10-CM

## 2022-05-03 DIAGNOSIS — F17200 Nicotine dependence, unspecified, uncomplicated: Secondary | ICD-10-CM

## 2022-05-03 DIAGNOSIS — F17210 Nicotine dependence, cigarettes, uncomplicated: Secondary | ICD-10-CM | POA: Diagnosis not present

## 2022-05-03 DIAGNOSIS — R0789 Other chest pain: Secondary | ICD-10-CM | POA: Diagnosis not present

## 2022-05-03 MED ORDER — ASPIRIN 81 MG PO TBEC: 81.0000 mg | DELAYED_RELEASE_TABLET | Freq: Every day | ORAL | 3 refills | Status: DC

## 2022-05-03 NOTE — Addendum Note (Signed)
Addended by: Roxanne Mins I on: 05/03/2022 11:42 AM   Modules accepted: Orders

## 2022-05-03 NOTE — Addendum Note (Signed)
Addended by: Roxanne Mins I on: 05/03/2022 11:36 AM   Modules accepted: Orders

## 2022-05-03 NOTE — Progress Notes (Signed)
Cardiology Consultation:    Date:  05/03/2022   ID:  Jeffrey Odor., DOB 08/28/1940, MRN [id number]  PCP:  Noni Saupe, MD  Cardiologist:  Gypsy Balsam, MD   Referring MD: Noni Saupe, MD   No chief complaint on file. I would like to have my heart checked  History of Present Illness:    Jeffrey Pickrell. is a 82 y.o. male who is being seen today for the evaluation of multiple risk factors for coronary artery disease and atypical chest pain at the request of [place], John F. II, MD. past medical history for CAD for essential hypertension, dyslipidemia, smoking which is still ongoing has been smoking since he was 13, tumor of the lung likely improved, he was referred to [place] for evaluation of coronary artery disease.  He described to have shortness of breath while walking sometimes developed some uneasy sensation in the chest when it happens.  Denies have any palpitations.  He did notice swelling of lower extremities specially evening time but lately today he does not have it.  He comes today to my office with his daughter and she actually is the one who pushed him to be checked to make sure that his heart is okay.  He never had any heart trouble.  He has been smoking all his life since he was 13.  He tried to quit few times obviously did not succeed.  Does not exercise on the regular basis he is not on any special diet  Past Medical History:  Diagnosis Date   Alcoholism in recovery [facility])    Aortic atherosclerosis (HCC)    Back pain 06/17/2014   Chronic lung disease    CKD (chronic kidney disease)    COPD (chronic obstructive pulmonary disease) (HCC) 07/23/2014   Essential hypertension    Gout    History of lung cancer    History of pancreatitis    Iron deficiency anemia    Lumbar disc disease    Mixed hyperlipidemia    Notalgia    Prostate hypertrophy    Pulmonary nodules 07/23/2014   Swelling    Tobacco use disorder 07/23/2014   Tumor of lung 01/26/2021    Type 2 diabetes mellitus without complication (HCC)    Uncontrolled type 2 diabetes mellitus with hyperglycemia, without long-term current use of insulin (HCC)     Past Surgical History:  Procedure Laterality Date   BACK SURGERY      Current Medications: Current Meds  Medication Sig   albuterol (VENTOLIN HFA) 108 (90 Base) MCG/ACT inhaler Inhale 2 puffs into the lungs 3 (three) times daily as needed for wheezing or shortness of breath.   allopurinol (ZYLOPRIM) 100 MG tablet Take 100 mg by mouth every morning.   amLODipine (NORVASC) 5 MG tablet Take 5 mg by mouth at bedtime.   aspirin 81 MG tablet Take 81 mg by mouth daily.   Cyanocobalamin (VITAMIN B-12) 2500 MCG SUBL Place 1 tablet under the tongue daily.   dapagliflozin propanediol (FARXIGA) 10 MG TABS tablet Take 1 tablet by mouth daily.   finasteride (PROSCAR) 5 MG tablet Take 5 mg by mouth daily.   Insulin Degludec (TRESIBA) 100 UNIT/ML SOLN Inject 18 mLs into the skin daily.   ipratropium-albuterol (DUONEB) 0.5-2.5 (3) MG/3ML SOLN Take 3 mLs by nebulization 3 (three) times daily.   Omega-3 Fatty Acids (FISH OIL) 1200 MG CAPS Take 1,200 mg by mouth 2 (two) times daily.    simvastatin (ZOCOR)  10 MG tablet Take 10 mg by mouth at bedtime.   tamsulosin (FLOMAX) 0.4 MG CAPS capsule Take 0.8 mg by mouth at bedtime.   TRELEGY ELLIPTA 100-62.5-25 MCG/ACT AEPB Inhale 1 puff into the lungs daily.   valsartan-hydrochlorothiazide (DIOVAN-HCT) 320-25 MG tablet Take 1 tablet by mouth daily.     Allergies:   Chantix continuing month [varenicline], Januvia [sitagliptin], Lisinopril, Motrin [ibuprofen], Remeron [mirtazapine], and Penicillins   Social History   Socioeconomic History   Marital status: Single    Spouse name: Not on file   Number of children: Not on file   Years of education: Not on file   Highest education level: Not on file  Occupational History   Not on file  Tobacco Use   Smoking status: Every Day    Packs/day: 1.50     Years: 60.00    Total pack years: 90.00    Types: Cigarettes    Last attempt to quit: 10/2021    Years since quitting: 0.5   Smokeless tobacco: Never  Vaping Use   Vaping Use: Never used  Substance and Sexual Activity   Alcohol use: Not Currently    Alcohol/week: 3.0 standard drinks of alcohol    Types: 3 Standard drinks or equivalent per week   Drug use: No   Sexual activity: Not on file  Other Topics Concern   Not on file  Social History Narrative   Not on file   Social Determinants of Health   Financial Resource Strain: Not on file  Food Insecurity: Not on file  Transportation Needs: Not on file  Physical Activity: Not on file  Stress: Not on file  Social Connections: Not on file     Family History: The patient's family history includes Lung cancer in his father and mother. ROS:   Please see the history of present illness.    All 14 point review of systems negative except as described per history of present illness.  EKGs/Labs/Other Studies Reviewed:    The following studies were reviewed today:   EKG:  EKG is  ordered today.  The ekg ordered today demonstrates normal sinus rhythm, right axis deviation, nonspecific ST segment changes  Recent Labs: No results found for requested labs within last 365 days.  Recent Lipid Panel No results found for: "CHOL", "TRIG", "HDL", "CHOLHDL", "VLDL", "LDLCALC", "LDLDIRECT"  Physical Exam:    VS:  BP (!) 156/70 (BP Location: Left Arm, Patient Position: Sitting, Cuff Size: Normal)   Pulse 78   Ht 5\' 10"  (1.778 m)   Wt 154 lb (69.9 kg)   SpO2 92%   BMI 22.10 kg/m     Wt Readings from Last 3 Encounters:  05/03/22 154 lb (69.9 kg)  03/29/22 153 lb (69.4 kg)  02/26/15 162 lb (73.5 kg)     GEN:  Well nourished, well developed in no acute distress HEENT: Normal NECK: No JVD; No carotid bruits LYMPHATICS: No lymphadenopathy CARDIAC: RRR, no murmurs, no rubs, no gallops RESPIRATORY: Poor air entry bilaterally  rhonchi ABDOMEN: Soft, non-tender, non-distended MUSCULOSKELETAL:  No edema; No deformity  SKIN: Warm and dry NEUROLOGIC:  Alert and oriented x 3 PSYCHIATRIC:  Normal affect   ASSESSMENT:    1. Atypical chest pain   2. Essential hypertension   3. Chronic obstructive pulmonary disease, unspecified COPD type (HCC)   4. Tobacco use disorder   5. Type 2 diabetes mellitus without complication, unspecified whether long term insulin use (HCC)    PLAN:    In  order of problems listed above:  Multiple risk factors for coronary artery disease with atypical chest pain.  I think coronary artery CTA will not be a good choice of test because I anticipate him to have heavily calcified vessels.  Therefore, for evaluation of CAD I will schedule him to have stress test.  Procedure explained to him as well as to his daughter who was present during our discussion.  In the meantime I will start him with baby aspirin every single day. I suspect he may have some carotic arterial disease there is very soft bruit on the right side, we will schedule him to have carotic ultrasounds.  In the future anticipate need to do screening for abdominal arctic aneurysm since he is more than 82 years old and he continues to smoke. Essential hypertension this is first visit in my office blood pressure will be elevated we will keep an eye on this. For dyslipidemia he is taking small dose of statin which I will continue for now I do have labs done in Aug 28, 2021 with total cholesterol 123 HDL 49.  Will continue present management. Type 2 diabetes.  He said it is well-controlled however last number I see is from December 2022 with hemoglobin A1c 8.2. Smoking: We spent at least 5 minutes talking about different techniques that he can use to quit.  Interesting his daughter who is with him in the room she is trying Chantix but she said she does not like side effect of this and she does not think it helps.   Medication Adjustments/Labs  and Tests Ordered: Current medicines are reviewed at length with the patient today.  Concerns regarding medicines are outlined above.  No orders of the defined types were placed in this encounter.  No orders of the defined types were placed in this encounter.   Signed, Georgeanna Lea, MD, [facility]. 05/03/2022 11:19 AM    [facility]

## 2022-05-03 NOTE — Patient Instructions (Signed)
Medication Instructions:  Your physician has recommended you make the following change in your medication:   START: Aspirin 81 mg daily  *If you need a refill on your cardiac medications before your next appointment, please call your pharmacy*   Lab Work: None If you have labs (blood work) drawn today and your tests are completely normal, you will receive your results only by: MyChart Message (if you have MyChart) OR A paper copy in the mail If you have any lab test that is abnormal or we need to change your treatment, we will call you to review the results.   Testing/Procedures:   [facility] [address] [place], [place] [address] Phone:  [phone]    Please arrive 15 minutes prior to your appointment time for registration and insurance purposes.  The test will take approximately 3 to 4 hours to complete; you may bring reading material.  If someone comes with you to your appointment, they will need to remain in the main lobby due to limited space in the testing area. **If you are pregnant or breastfeeding, please notify the nuclear lab prior to your appointment**  How to prepare for your Myocardial Perfusion Test: Do not eat or drink 3 hours prior to your test, except you may have water. Do not consume products containing caffeine (regular or decaffeinated) 12 hours prior to your test. (ex: coffee, chocolate, sodas, tea). Do bring a list of your current medications with you.  If not listed below, you may take your medications as normal. HOLD diabetic medication/insulin the morning of the test: Tresiba  Do wear comfortable clothes (no dresses or overalls) and walking shoes, tennis shoes preferred (No heels or open toe shoes are allowed). Do NOT wear cologne, perfume, aftershave, or lotions (deodorant is allowed). If these instructions are not followed, your test will have to be rescheduled.  Please report to [address] for your test.   If you have questions or concerns about your appointment, you can call the [facility] at [phone].  If you cannot keep your appointment, please provide 24 hours notification to the Nuclear Lab, to avoid a possible $50 charge to your account.  Your physician has requested that you have an echocardiogram. Echocardiography is a painless test that uses sound waves to create images of your heart. It provides your doctor with information about the size and shape of your heart and how well your heart's chambers and valves are working. This procedure takes approximately one hour. There are no restrictions for this procedure. Please do NOT wear cologne, perfume, aftershave, or lotions (deodorant is allowed). Please arrive 15 minutes prior to your appointment time.  Your physician has requested that you have a carotid duplex. This test is an ultrasound of the carotid arteries in your neck. It looks at blood flow through these arteries that supply the brain with blood. Allow one hour for this exam. There are no restrictions or special instructions.    Follow-Up: At [facility], you and your health needs are our priority.  As part of our continuing mission to provide you with exceptional heart care, we have created designated Provider Care Teams.  These Care Teams include your primary Cardiologist (physician) and Advanced Practice Providers (APPs -  Physician Assistants and Nurse Practitioners) who all work together to provide you with the care you need, when you need it.  We recommend signing up for the patient portal called "MyChart".  Sign  up information is provided on this After Visit Summary.  MyChart is used to connect with patients for Virtual Visits (Telemedicine).  Patients are able to view lab/test results, encounter notes, upcoming appointments, etc.  Non-urgent messages can be sent to your provider as well.   To learn more about what you can do with  MyChart, go to [email or web address].    Your next appointment:   2 month(s)  Provider:   Gypsy Balsam, MD    Other Instructions None

## 2022-05-04 ENCOUNTER — Telehealth: Payer: Self-pay

## 2022-05-04 NOTE — Telephone Encounter (Signed)
Attempted to contact to the patient, all numbers were invalid. S.Ladislav Caselli EMTP

## 2022-05-05 ENCOUNTER — Ambulatory Visit: Payer: Medicare Other

## 2022-05-06 ENCOUNTER — Other Ambulatory Visit: Payer: Self-pay | Admitting: Radiology

## 2022-05-06 ENCOUNTER — Ambulatory Visit: Payer: Medicare Other | Attending: Cardiology

## 2022-05-06 VITALS — Ht 70.0 in | Wt 154.0 lb

## 2022-05-06 DIAGNOSIS — F17200 Nicotine dependence, unspecified, uncomplicated: Secondary | ICD-10-CM

## 2022-05-06 DIAGNOSIS — I10 Essential (primary) hypertension: Secondary | ICD-10-CM

## 2022-05-06 DIAGNOSIS — R0789 Other chest pain: Secondary | ICD-10-CM | POA: Diagnosis not present

## 2022-05-06 DIAGNOSIS — E119 Type 2 diabetes mellitus without complications: Secondary | ICD-10-CM | POA: Diagnosis not present

## 2022-05-06 DIAGNOSIS — J449 Chronic obstructive pulmonary disease, unspecified: Secondary | ICD-10-CM | POA: Diagnosis not present

## 2022-05-06 DIAGNOSIS — R809 Proteinuria, unspecified: Secondary | ICD-10-CM

## 2022-05-06 LAB — MYOCARDIAL PERFUSION IMAGING
LV dias vol: 117 mL (ref 62–150)
LV sys vol: 57 mL
Nuc Stress EF: 51 %
Peak HR: 78 {beats}/min
Rest HR: 56 {beats}/min
Rest Nuclear Isotope Dose: 10.1 mCi
SDS: 3
SRS: 1
SSS: 4
ST Depression (mm): 0 mm
Stress Nuclear Isotope Dose: 30.8 mCi
TID: 1.1

## 2022-05-06 MED ORDER — TECHNETIUM TC 99M TETROFOSMIN IV KIT
30.8000 | PACK | Freq: Once | INTRAVENOUS | Status: AC | PRN
  Administered 2022-05-06: 30.8 via INTRAVENOUS

## 2022-05-06 MED ORDER — REGADENOSON 0.4 MG/5ML IV SOLN
0.4000 mg | Freq: Once | INTRAVENOUS | Status: AC
  Administered 2022-05-06: 0.4 mg via INTRAVENOUS

## 2022-05-06 MED ORDER — TECHNETIUM TC 99M TETROFOSMIN IV KIT
10.1000 | PACK | Freq: Once | INTRAVENOUS | Status: AC | PRN
  Administered 2022-05-06: 10.1 via INTRAVENOUS

## 2022-05-09 DIAGNOSIS — E1165 Type 2 diabetes mellitus with hyperglycemia: Secondary | ICD-10-CM | POA: Diagnosis not present

## 2022-05-10 ENCOUNTER — Other Ambulatory Visit (HOSPITAL_COMMUNITY): Payer: Self-pay | Admitting: Physician Assistant

## 2022-05-10 NOTE — Telephone Encounter (Signed)
Patient is returning call.  °

## 2022-05-11 ENCOUNTER — Telehealth: Payer: Self-pay

## 2022-05-11 ENCOUNTER — Encounter (HOSPITAL_COMMUNITY): Payer: Self-pay

## 2022-05-11 ENCOUNTER — Ambulatory Visit (HOSPITAL_COMMUNITY)
Admission: RE | Admit: 2022-05-11 | Discharge: 2022-05-11 | Disposition: A | Discharge status: Home / Self Care | Payer: Medicare Other | Source: Ambulatory Visit | Attending: Nephrology | Admitting: Nephrology

## 2022-05-11 ENCOUNTER — Other Ambulatory Visit: Payer: Self-pay

## 2022-05-11 DIAGNOSIS — J449 Chronic obstructive pulmonary disease, unspecified: Secondary | ICD-10-CM | POA: Insufficient documentation

## 2022-05-11 DIAGNOSIS — I129 Hypertensive chronic kidney disease with stage 1 through stage 4 chronic kidney disease, or unspecified chronic kidney disease: Secondary | ICD-10-CM | POA: Diagnosis not present

## 2022-05-11 DIAGNOSIS — Z794 Long term (current) use of insulin: Secondary | ICD-10-CM | POA: Insufficient documentation

## 2022-05-11 DIAGNOSIS — Z7982 Long term (current) use of aspirin: Secondary | ICD-10-CM | POA: Insufficient documentation

## 2022-05-11 DIAGNOSIS — R600 Localized edema: Secondary | ICD-10-CM | POA: Insufficient documentation

## 2022-05-11 DIAGNOSIS — R808 Other proteinuria: Secondary | ICD-10-CM | POA: Diagnosis not present

## 2022-05-11 DIAGNOSIS — N189 Chronic kidney disease, unspecified: Secondary | ICD-10-CM | POA: Insufficient documentation

## 2022-05-11 DIAGNOSIS — Z7951 Long term (current) use of inhaled steroids: Secondary | ICD-10-CM | POA: Diagnosis not present

## 2022-05-11 DIAGNOSIS — Z7984 Long term (current) use of oral hypoglycemic drugs: Secondary | ICD-10-CM | POA: Diagnosis not present

## 2022-05-11 DIAGNOSIS — Z0389 Encounter for observation for other suspected diseases and conditions ruled out: Secondary | ICD-10-CM | POA: Diagnosis not present

## 2022-05-11 DIAGNOSIS — F17210 Nicotine dependence, cigarettes, uncomplicated: Secondary | ICD-10-CM | POA: Insufficient documentation

## 2022-05-11 DIAGNOSIS — Z79899 Other long term (current) drug therapy: Secondary | ICD-10-CM | POA: Insufficient documentation

## 2022-05-11 DIAGNOSIS — R809 Proteinuria, unspecified: Secondary | ICD-10-CM | POA: Diagnosis not present

## 2022-05-11 LAB — CBC
HCT: 42 % (ref 39.0–52.0)
Hemoglobin: 14.4 g/dL (ref 13.0–17.0)
MCH: 32.7 pg (ref 26.0–34.0)
MCHC: 34.3 g/dL (ref 30.0–36.0)
MCV: 95.2 fL (ref 80.0–100.0)
Platelets: 196 10*3/uL (ref 150–400)
RBC: 4.41 MIL/uL (ref 4.22–5.81)
RDW: 15.3 % (ref 11.5–15.5)
WBC: 6.9 10*3/uL (ref 4.0–10.5)
nRBC: 0 % (ref 0.0–0.2)

## 2022-05-11 LAB — GLUCOSE, CAPILLARY: Glucose-Capillary: 96 mg/dL (ref 70–99)

## 2022-05-11 LAB — PROTIME-INR
INR: 1 (ref 0.8–1.2)
Prothrombin Time: 12.7 seconds (ref 11.4–15.2)

## 2022-05-11 MED ORDER — AMLODIPINE BESYLATE 5 MG PO TABS
5.0000 mg | ORAL_TABLET | Freq: Every day | ORAL | Status: DC
  Filled 2022-05-11: qty 1

## 2022-05-11 MED ORDER — MIDAZOLAM HCL 2 MG/2ML IJ SOLN
INTRAMUSCULAR | Status: AC | PRN
  Administered 2022-05-11: 1 mg via INTRAVENOUS
  Administered 2022-05-11 (×2): .5 mg via INTRAVENOUS

## 2022-05-11 MED ORDER — GELATIN ABSORBABLE 12-7 MM EX MISC
CUTANEOUS | Status: AC
  Filled 2022-05-11: qty 1

## 2022-05-11 MED ORDER — FENTANYL CITRATE (PF) 100 MCG/2ML IJ SOLN
INTRAMUSCULAR | Status: AC | PRN
  Administered 2022-05-11: 50 ug via INTRAVENOUS
  Administered 2022-05-11 (×2): 25 ug via INTRAVENOUS

## 2022-05-11 MED ORDER — SODIUM CHLORIDE 0.9 % IV SOLN
INTRAVENOUS | Status: AC | PRN
  Administered 2022-05-11: 10 mL/h via INTRAVENOUS

## 2022-05-11 MED ORDER — SODIUM CHLORIDE 0.9 % IV SOLN: INTRAVENOUS | Status: DC

## 2022-05-11 MED ORDER — LIDOCAINE HCL (PF) 1 % IJ SOLN
9.0000 mL | Freq: Once | INTRAMUSCULAR | Status: AC
  Administered 2022-05-11: 9 mL via INTRADERMAL

## 2022-05-11 MED ORDER — FENTANYL CITRATE (PF) 100 MCG/2ML IJ SOLN
INTRAMUSCULAR | Status: AC
  Filled 2022-05-11: qty 2

## 2022-05-11 MED ORDER — LIDOCAINE HCL (PF) 1 % IJ SOLN
INTRAMUSCULAR | Status: AC
  Filled 2022-05-11: qty 30

## 2022-05-11 MED ORDER — MIDAZOLAM HCL 2 MG/2ML IJ SOLN
INTRAMUSCULAR | Status: AC
  Filled 2022-05-11: qty 2

## 2022-05-11 NOTE — H&P (Signed)
Chief Complaint: Patient was seen in consultation today for random renal biopsy at the request of Sanford,Ryan B  Referring Physician(s): Heber B  Supervising Physician: Corrie Mckusick  Patient Status: [facility] - Out-pt  History of Present Illness: Even Budlong. is a 82 y.o. male   Nephrotic range proteinuria CKD Edema; HTN Smoker- COPD Follows with Dr Joelyn Oms Scheduled for random renal biopsy  Past Medical History:  Diagnosis Date   Alcoholism in recovery [facility])    Aortic atherosclerosis ([place])    Back pain 06/17/2014   Chronic lung disease    CKD (chronic kidney disease)    COPD (chronic obstructive pulmonary disease) ([place]) 07/23/2014   Essential hypertension    Gout    History of lung cancer    History of pancreatitis    Iron deficiency anemia    Lumbar disc disease    Mixed hyperlipidemia    Notalgia    Prostate hypertrophy    Pulmonary nodules 07/23/2014   Swelling    Tobacco use disorder 07/23/2014   Tumor of lung 01/26/2021   Type 2 diabetes mellitus without complication ([place])    Uncontrolled type 2 diabetes mellitus with hyperglycemia, without long-term current use of insulin (HCC)     Past Surgical History:  Procedure Laterality Date   BACK SURGERY      Allergies: Chantix continuing month [varenicline], Januvia [sitagliptin], Lisinopril, Motrin [ibuprofen], Remeron [mirtazapine], and Penicillins  Medications: Prior to Admission medications   Medication Sig Start Date End Date Taking? Authorizing Provider  albuterol (VENTOLIN HFA) 108 (90 Base) MCG/ACT inhaler Inhale 2 puffs into the lungs 3 (three) times daily as needed for wheezing or shortness of breath. 09/01/21   [provider]  allopurinol (ZYLOPRIM) 100 MG tablet Take 100 mg by mouth every morning. 02/25/22   [provider]  amLODipine (NORVASC) 5 MG tablet Take 5 mg by mouth at bedtime. 02/25/22   [provider]  aspirin EC 81 MG tablet Take 1 tablet  (81 mg total) by mouth daily. Swallow whole. 05/03/22   Park Liter, MD  Cyanocobalamin (VITAMIN B-12) 2500 MCG SUBL Place 1 tablet under the tongue daily.    [provider]  dapagliflozin propanediol (FARXIGA) 10 MG TABS tablet Take 1 tablet by mouth daily. 09/03/21   [provider]  finasteride (PROSCAR) 5 MG tablet Take 5 mg by mouth daily. 02/25/22   [provider]  Insulin Degludec (TRESIBA) 100 UNIT/ML SOLN Inject 18 mLs into the skin daily.    [provider]  ipratropium-albuterol (DUONEB) 0.5-2.5 (3) MG/3ML SOLN Take 3 mLs by nebulization 3 (three) times daily.    [provider]  Omega-3 Fatty Acids (FISH OIL) 1200 MG CAPS Take 1,200 mg by mouth 2 (two) times daily.     [provider]  simvastatin (ZOCOR) 10 MG tablet Take 10 mg by mouth at bedtime. 02/25/22   [provider]  tamsulosin (FLOMAX) 0.4 MG CAPS capsule Take 0.8 mg by mouth at bedtime. 02/25/22   [provider]  TRELEGY ELLIPTA 100-62.5-25 MCG/ACT AEPB Inhale 1 puff into the lungs daily.    [provider]  valsartan-hydrochlorothiazide (DIOVAN-HCT) 320-25 MG tablet Take 1 tablet by mouth daily. 04/28/22   [provider]     Family History  Problem Relation Age of Onset   Lung cancer Mother    Lung cancer Father     Social History   Socioeconomic History   Marital status: Single    Spouse  name: Not on file   Number of children: Not on file   Years of education: Not on file   Highest education level: Not on file  Occupational History   Not on file  Tobacco Use   Smoking status: Every Day    Packs/day: 1.50    Years: 60.00    Total pack years: 90.00    Types: Cigarettes    Last attempt to quit: 10/2021    Years since quitting: 0.5   Smokeless tobacco: Never  Vaping Use   Vaping Use: Never used  Substance and Sexual Activity   Alcohol use: Not Currently    Alcohol/week: 3.0 standard drinks of alcohol     Types: 3 Standard drinks or equivalent per week   Drug use: No   Sexual activity: Not on file  Other Topics Concern   Not on file  Social History Narrative   Not on file   Social Determinants of Health   Financial Resource Strain: Not on file  Food Insecurity: Not on file  Transportation Needs: Not on file  Physical Activity: Not on file  Stress: Not on file  Social Connections: Not on file    Review of Systems: A 12 point ROS discussed and pertinent positives are indicated in the HPI above.  All other systems are negative.  Review of Systems  Constitutional:  Negative for activity change, fatigue and fever.  Respiratory:  Positive for cough, shortness of breath and wheezing.   Psychiatric/Behavioral:  Negative for behavioral problems and confusion.     Vital Signs: BP (!) 146/68   Pulse 73   Temp (!) 97.1 F (36.2 C) (Temporal)   Resp 17   Ht 5\' 11"  (1.803 m)   Wt 150 lb (68 kg)   SpO2 93%   BMI 20.92 kg/m    Physical Exam Vitals reviewed.  HENT:     Mouth/Throat:     Mouth: Mucous membranes are moist.  Cardiovascular:     Rate and Rhythm: Normal rate and regular rhythm.     Heart sounds: Normal heart sounds.  Pulmonary:     Breath sounds: Wheezing and rhonchi present.  Abdominal:     Palpations: Abdomen is soft.  Musculoskeletal:        General: Normal range of motion.  Skin:    General: Skin is warm.  Neurological:     Mental Status: He is alert and oriented to person, place, and time.  Psychiatric:        Behavior: Behavior normal.     Imaging: MYOCARDIAL PERFUSION IMAGING  Result Date: 05/06/2022   Findings are consistent with no ischemia and no infarction. The study is low risk.   No ST deviation was noted.   Left ventricular function is abnormal. Global function is mildly reduced. Nuclear stress EF: 51 %. The left ventricular ejection fraction is mildly decreased (45-54%). End diastolic cavity size is normal.    Labs:  CBC: No results for  input(s): "WBC", "HGB", "HCT", "PLT" in the last 8760 hours.  COAGS: No results for input(s): "INR", "APTT" in the last 8760 hours.  BMP: No results for input(s): "NA", "K", "CL", "CO2", "GLUCOSE", "BUN", "CALCIUM", "CREATININE", "GFRNONAA", "GFRAA" in the last 8760 hours.  Invalid input(s): "CMP"  LIVER FUNCTION TESTS: No results for input(s): "BILITOT", "AST", "ALT", "ALKPHOS", "PROT", "ALBUMIN" in the last 8760 hours.  TUMOR MARKERS: No results for input(s): "AFPTM", "CEA", "CA199", "CHROMGRNA" in the last 8760 hours.  Assessment and Plan:  Nephrotic range proteinuria  CKD Scheduled today for random renal biopsy per Nephrology Risks and benefits of random renal biopsy was discussed with the patient and/or patient's family including, but not limited to bleeding, infection, damage to adjacent structures or low yield requiring additional tests.  All of the questions were answered and there is agreement to proceed.  Consent signed and in chart.   Thank you for this interesting consult.  I greatly enjoyed meeting Jeffrey Randall. and look forward to participating in their care.  A copy of this report was sent to the requesting provider on this date.  Electronically Signed: Lavonia Drafts, PA-C 05/11/2022, 10:15 AM   I spent a total of  30 Minutes   in face to face in clinical consultation, greater than 50% of which was counseling/coordinating care for random renal biopsy

## 2022-05-11 NOTE — Procedures (Signed)
Interventional Radiology Procedure Note  Procedure: US guided biopsy of left kidney, medical renal Complications: None EBL: None Recommendations: - Bedrest 2 hours.   - Routine wound care - Follow up pathology - Advance diet   Signed,  Corrie Mckusick, DO

## 2022-05-11 NOTE — Telephone Encounter (Signed)
Results reviewed with Jeffrey Randall- per DPR- as per Dr. Wendy Poet note.  Daughter verbalized understanding and had no additional questions. Routed to PCP

## 2022-05-11 NOTE — Addendum Note (Signed)
Addended by: Jenne Campus on: 05/11/2022 09:21 AM   Modules accepted: Orders

## 2022-05-11 NOTE — Progress Notes (Signed)
Patient was given discharge instructions. He verbalized understanding. 

## 2022-05-13 ENCOUNTER — Encounter (HOSPITAL_COMMUNITY): Payer: Self-pay

## 2022-05-13 ENCOUNTER — Ambulatory Visit: Payer: Medicare Other

## 2022-05-20 LAB — SURGICAL PATHOLOGY

## 2022-05-27 ENCOUNTER — Ambulatory Visit: Payer: Medicare Other

## 2022-05-27 ENCOUNTER — Ambulatory Visit: Payer: Medicare Other | Attending: Cardiology

## 2022-06-09 DIAGNOSIS — E1165 Type 2 diabetes mellitus with hyperglycemia: Secondary | ICD-10-CM | POA: Diagnosis not present

## 2022-06-10 ENCOUNTER — Encounter: Payer: Self-pay | Admitting: Cardiology

## 2022-06-10 DIAGNOSIS — I10 Essential (primary) hypertension: Secondary | ICD-10-CM | POA: Diagnosis not present

## 2022-06-10 DIAGNOSIS — J984 Other disorders of lung: Secondary | ICD-10-CM | POA: Diagnosis not present

## 2022-06-10 DIAGNOSIS — E1165 Type 2 diabetes mellitus with hyperglycemia: Secondary | ICD-10-CM | POA: Diagnosis not present

## 2022-07-07 ENCOUNTER — Ambulatory Visit: Payer: Medicare Other | Attending: Cardiology | Admitting: Cardiology

## 2022-07-09 DIAGNOSIS — E1165 Type 2 diabetes mellitus with hyperglycemia: Secondary | ICD-10-CM | POA: Diagnosis not present

## 2022-07-11 DIAGNOSIS — J984 Other disorders of lung: Secondary | ICD-10-CM | POA: Diagnosis not present

## 2022-07-11 DIAGNOSIS — E1165 Type 2 diabetes mellitus with hyperglycemia: Secondary | ICD-10-CM | POA: Diagnosis not present

## 2022-07-11 DIAGNOSIS — I10 Essential (primary) hypertension: Secondary | ICD-10-CM | POA: Diagnosis not present

## 2022-07-14 ENCOUNTER — Encounter: Payer: Self-pay | Admitting: Cardiology

## 2022-08-08 DIAGNOSIS — E1165 Type 2 diabetes mellitus with hyperglycemia: Secondary | ICD-10-CM | POA: Diagnosis not present

## 2022-09-07 DIAGNOSIS — E1165 Type 2 diabetes mellitus with hyperglycemia: Secondary | ICD-10-CM | POA: Diagnosis not present

## 2022-09-10 DIAGNOSIS — E1122 Type 2 diabetes mellitus with diabetic chronic kidney disease: Secondary | ICD-10-CM | POA: Diagnosis not present

## 2022-09-10 DIAGNOSIS — R609 Edema, unspecified: Secondary | ICD-10-CM | POA: Diagnosis not present

## 2022-09-10 DIAGNOSIS — I129 Hypertensive chronic kidney disease with stage 1 through stage 4 chronic kidney disease, or unspecified chronic kidney disease: Secondary | ICD-10-CM | POA: Diagnosis not present

## 2022-09-10 DIAGNOSIS — N1832 Chronic kidney disease, stage 3b: Secondary | ICD-10-CM | POA: Diagnosis not present

## 2022-09-10 DIAGNOSIS — E1121 Type 2 diabetes mellitus with diabetic nephropathy: Secondary | ICD-10-CM | POA: Diagnosis not present

## 2022-09-10 DIAGNOSIS — R809 Proteinuria, unspecified: Secondary | ICD-10-CM | POA: Diagnosis not present

## 2022-09-14 ENCOUNTER — Other Ambulatory Visit (HOSPITAL_COMMUNITY): Payer: Self-pay | Admitting: Nephrology

## 2022-09-14 DIAGNOSIS — I129 Hypertensive chronic kidney disease with stage 1 through stage 4 chronic kidney disease, or unspecified chronic kidney disease: Secondary | ICD-10-CM

## 2022-09-14 DIAGNOSIS — N1832 Type 2 diabetes mellitus with diabetic chronic kidney disease: Secondary | ICD-10-CM

## 2022-10-01 NOTE — Progress Notes (Unsigned)
Arita Miss, MD  Leodis Rains D He already had a biopsy in January. We don't need another one. I'm not sure what exactly happened. It can be canceled.

## 2022-10-07 DIAGNOSIS — E1165 Type 2 diabetes mellitus with hyperglycemia: Secondary | ICD-10-CM | POA: Diagnosis not present

## 2022-12-20 DIAGNOSIS — E1165 Type 2 diabetes mellitus with hyperglycemia: Secondary | ICD-10-CM | POA: Diagnosis not present

## 2022-12-22 DIAGNOSIS — R590 Localized enlarged lymph nodes: Secondary | ICD-10-CM | POA: Diagnosis not present

## 2022-12-22 DIAGNOSIS — J701 Chronic and other pulmonary manifestations due to radiation: Secondary | ICD-10-CM | POA: Diagnosis not present

## 2022-12-22 DIAGNOSIS — Z85118 Personal history of other malignant neoplasm of bronchus and lung: Secondary | ICD-10-CM | POA: Diagnosis not present

## 2022-12-22 DIAGNOSIS — C3411 Malignant neoplasm of upper lobe, right bronchus or lung: Secondary | ICD-10-CM | POA: Diagnosis not present

## 2023-01-10 DIAGNOSIS — E785 Hyperlipidemia, unspecified: Secondary | ICD-10-CM | POA: Diagnosis not present

## 2023-01-10 DIAGNOSIS — E1165 Type 2 diabetes mellitus with hyperglycemia: Secondary | ICD-10-CM | POA: Diagnosis not present

## 2023-01-10 DIAGNOSIS — I10 Essential (primary) hypertension: Secondary | ICD-10-CM | POA: Diagnosis not present

## 2023-02-15 ENCOUNTER — Other Ambulatory Visit (HOSPITAL_BASED_OUTPATIENT_CLINIC_OR_DEPARTMENT_OTHER): Payer: Self-pay

## 2023-02-15 MED ORDER — FREESTYLE LIBRE 3 PLUS SENSOR MISC
1.0000 | 11 refills | Status: DC
  Filled 2023-02-15: qty 2, 28d supply, fill #0
  Filled 2023-03-09: qty 2, 30d supply, fill #0

## 2023-02-18 DIAGNOSIS — E1165 Type 2 diabetes mellitus with hyperglycemia: Secondary | ICD-10-CM | POA: Diagnosis not present

## 2023-03-09 ENCOUNTER — Other Ambulatory Visit (HOSPITAL_BASED_OUTPATIENT_CLINIC_OR_DEPARTMENT_OTHER): Payer: Self-pay

## 2023-03-21 ENCOUNTER — Other Ambulatory Visit (HOSPITAL_BASED_OUTPATIENT_CLINIC_OR_DEPARTMENT_OTHER): Payer: Self-pay

## 2023-03-23 DIAGNOSIS — I10 Essential (primary) hypertension: Secondary | ICD-10-CM | POA: Diagnosis not present

## 2023-03-23 DIAGNOSIS — E785 Hyperlipidemia, unspecified: Secondary | ICD-10-CM | POA: Diagnosis not present

## 2023-03-23 DIAGNOSIS — E1165 Type 2 diabetes mellitus with hyperglycemia: Secondary | ICD-10-CM | POA: Diagnosis not present

## 2023-03-30 DIAGNOSIS — Z6820 Body mass index (BMI) 20.0-20.9, adult: Secondary | ICD-10-CM | POA: Diagnosis not present

## 2023-03-30 DIAGNOSIS — I10 Essential (primary) hypertension: Secondary | ICD-10-CM | POA: Diagnosis not present

## 2023-03-30 DIAGNOSIS — E1165 Type 2 diabetes mellitus with hyperglycemia: Secondary | ICD-10-CM | POA: Diagnosis not present

## 2023-03-30 DIAGNOSIS — M1000 Idiopathic gout, unspecified site: Secondary | ICD-10-CM | POA: Diagnosis not present

## 2023-03-30 DIAGNOSIS — D510 Vitamin B12 deficiency anemia due to intrinsic factor deficiency: Secondary | ICD-10-CM | POA: Diagnosis not present

## 2023-03-30 DIAGNOSIS — E7800 Pure hypercholesterolemia, unspecified: Secondary | ICD-10-CM | POA: Diagnosis not present

## 2023-05-05 ENCOUNTER — Other Ambulatory Visit (HOSPITAL_COMMUNITY): Payer: Self-pay

## 2023-05-05 ENCOUNTER — Telehealth: Payer: Self-pay

## 2023-05-05 ENCOUNTER — Ambulatory Visit: Payer: Medicare Other | Admitting: Pulmonary Disease

## 2023-05-05 ENCOUNTER — Encounter: Payer: Self-pay | Admitting: Pulmonary Disease

## 2023-05-05 VITALS — BP 168/61 | HR 74 | Ht 70.0 in | Wt 141.8 lb

## 2023-05-05 DIAGNOSIS — J449 Chronic obstructive pulmonary disease, unspecified: Secondary | ICD-10-CM | POA: Diagnosis not present

## 2023-05-05 DIAGNOSIS — F17210 Nicotine dependence, cigarettes, uncomplicated: Secondary | ICD-10-CM

## 2023-05-05 DIAGNOSIS — R918 Other nonspecific abnormal finding of lung field: Secondary | ICD-10-CM

## 2023-05-05 MED ORDER — IPRATROPIUM-ALBUTEROL 0.5-2.5 (3) MG/3ML IN SOLN: 3.0000 mL | RESPIRATORY_TRACT | 3 refills | Status: DC | PRN

## 2023-05-05 MED ORDER — VARENICLINE TARTRATE (STARTER) 0.5 MG X 11 & 1 MG X 42 PO TBPK: 1.0000 | ORAL_TABLET | Freq: Every day | ORAL | 0 refills | Status: DC

## 2023-05-05 MED ORDER — NICOTINE 21 MG/24HR TD PT24: 21.0000 mg | MEDICATED_PATCH | Freq: Every day | TRANSDERMAL | 0 refills | Status: DC

## 2023-05-05 MED ORDER — TRELEGY ELLIPTA 200-62.5-25 MCG/ACT IN AEPB: 1.0000 | INHALATION_SPRAY | Freq: Every day | RESPIRATORY_TRACT | 3 refills | Status: DC

## 2023-05-05 NOTE — Patient Instructions (Signed)
VISIT SUMMARY:  During today's visit, we discussed your recent increase in shortness of breath, your ongoing smoking habit, and your history of lung cancer, chronic kidney disease, and heart disease. We reviewed your current medications and made some adjustments to better manage your symptoms.  YOUR PLAN:  -CHRONIC OBSTRUCTIVE PULMONARY DISEASE (COPD): COPD is a chronic lung condition that makes it hard to breathe. We have increased your Trelegy dose to daily and ordered a lung function test to assess the severity of your COPD. We also recommend considering a Pulmonary Rehabilitation program and have ordered a nebulizer with DuoNeb for as-needed use.  -TOBACCO USE DISORDER: Tobacco use disorder is a condition where you are addicted to smoking. We have prescribed Chantix and nicotine patches at the highest dose to help you quit smoking. Please set a quit date and try to stick to it.  -LUNG CANCER: You have a history of stage 1A lung cancer that was treated with radiation in 2022. Continue with your regular follow-ups with oncology every three months to monitor your condition.  -CHRONIC KIDNEY DISEASE (CKD) AND HEART DISEASE: Chronic kidney disease and heart disease are ongoing conditions that we are monitoring. No changes to your current management plan are needed at this time.  INSTRUCTIONS:  Please follow up in 6 months with the results of your lung function test.

## 2023-05-05 NOTE — Telephone Encounter (Signed)
Pharmacy Patient Advocate Encounter   Received notification from CoverMyMeds that prior authorization for Nicotine 21MG /24HR 24 hr patches is required/requested.   Insurance verification completed.   The patient is insured through [facility] Medicare Part D.   Per test claim: OTC medications are a plan exclusion and are not covered

## 2023-05-05 NOTE — Progress Notes (Signed)
Jeffrey Randall    [id number]    10/24/1940  Primary Care Physician:Redding, Valrie Hart, MD  Referring Physician: Noni Saupe, MD [address] [place],  [place] [address]  Chief complaint: Consult for COPD  HPI: 83 y.o. who  has a past medical history of Alcoholism in recovery [facility]), Aortic atherosclerosis (HCC), Back pain (06/17/2014), Chronic lung disease, CKD (chronic kidney disease), COPD (chronic obstructive pulmonary disease) (HCC) (07/23/2014), Essential hypertension, Gout, History of lung cancer, History of pancreatitis, Iron deficiency anemia, Lumbar disc disease, Mixed hyperlipidemia, Notalgia, Prostate hypertrophy, Pulmonary nodules (07/23/2014), Swelling, Tobacco use disorder (07/23/2014), Tumor of lung (01/26/2021), Type 2 diabetes mellitus without complication (HCC), and Uncontrolled type 2 diabetes mellitus with hyperglycemia, without long-term current use of insulin (HCC).   Discussed the use of AI scribe software for clinical note transcription with the patient, who gave verbal consent to proceed.  The patient, with a history of lung nodules, emphysema, and presumed stage 1A lung cancer treated with radiation, presents with shortness of breath that started about a month ago. The shortness of breath occurs with activity such as walking. The patient also has a history of hypertension and diabetes. Despite these conditions, the patient continues to smoke, currently at a pack and a half per day. The patient is on Trelegy and Butisol for COPD but there are concerns about the effectiveness of these medications due to the patient's difficulty with inhalation. The patient also has a nebulizer but it is unclear how much of the medication the patient is actually receiving. The patient has been evaluated for kidney disease and has chronic kidney disease and heart disease.   Pets: Dog Occupation: Retired Curator Exposures: No exposures.  No mold, hot tub,  Jacuzzi.  No feather pillows or comforters Smoking history: 90-pack-year smoker.  Continues to smoke Travel history: No significant travel history Relevant family history: No family history of lung disease   Outpatient Encounter Medications as of 05/05/2023  Medication Sig   albuterol (VENTOLIN HFA) 108 (90 Base) MCG/ACT inhaler Inhale 2 puffs into the lungs 3 (three) times daily as needed for wheezing or shortness of breath.   allopurinol (ZYLOPRIM) 100 MG tablet Take 100 mg by mouth every morning.   amLODipine (NORVASC) 5 MG tablet Take 5 mg by mouth at bedtime.   aspirin EC 81 MG tablet Take 1 tablet (81 mg total) by mouth daily. Swallow whole.   Continuous Glucose Sensor (FREESTYLE LIBRE 3 PLUS SENSOR) MISC Use as directed and change every 14 (fourteen) days.   Cyanocobalamin (VITAMIN B-12) 2500 MCG SUBL Place 1 tablet under the tongue daily.   dapagliflozin propanediol (FARXIGA) 10 MG TABS tablet Take 1 tablet by mouth daily.   finasteride (PROSCAR) 5 MG tablet Take 5 mg by mouth daily.   Fluticasone-Umeclidin-Vilant (TRELEGY ELLIPTA) 200-62.5-25 MCG/ACT AEPB Inhale 1 puff into the lungs daily.   Insulin Degludec (TRESIBA) 100 UNIT/ML SOLN Inject 18 mLs into the skin daily.   nicotine (NICODERM CQ - DOSED IN MG/24 HOURS) 21 mg/24hr patch Place 1 patch (21 mg total) onto the skin daily.   Omega-3 Fatty Acids (FISH OIL) 1200 MG CAPS Take 1,200 mg by mouth 2 (two) times daily.    simvastatin (ZOCOR) 10 MG tablet Take 10 mg by mouth at bedtime.   tamsulosin (FLOMAX) 0.4 MG CAPS capsule Take 0.8 mg by mouth at bedtime.   valsartan-hydrochlorothiazide (DIOVAN-HCT) 320-25 MG tablet Take 1 tablet by  mouth daily.   Varenicline Tartrate, Starter, (CHANTIX STARTING MONTH PAK) 0.5 MG X 11 & 1 MG X 42 TBPK Take 1 tablet by mouth daily.   [DISCONTINUED] ipratropium-albuterol (DUONEB) 0.5-2.5 (3) MG/3ML SOLN Take 3 mLs by nebulization 3 (three) times daily.   [DISCONTINUED] TRELEGY ELLIPTA 100-62.5-25  MCG/ACT AEPB Inhale 1 puff into the lungs daily.   ipratropium-albuterol (DUONEB) 0.5-2.5 (3) MG/3ML SOLN Take 3 mLs by nebulization every 4 (four) hours as needed.   No facility-administered encounter medications on file as of 05/05/2023.   Physical Exam: Blood pressure (!) 168/61, pulse 74, height 5\' 10"  (1.778 m), weight 141 lb 12.8 oz (64.3 kg), SpO2 93%. Gen:      No acute distress HEENT:  EOMI, sclera anicteric Neck:     No masses; no thyromegaly Lungs:    Clear to auscultation bilaterally; normal respiratory effort CV:         Regular rate and rhythm; no murmurs Abd:      + bowel sounds; soft, non-tender; no palpable masses, no distension Ext:    No edema; adequate peripheral perfusion Skin:      Warm and dry; no rash Neuro: alert and oriented x 3 Psych: normal mood and affect  Data Reviewed: Imaging: CT chest 01/21/2015 Interval resolution of bilateral pulmonary nodules, moderate upper lobe centrilobular emphysema, 3 textural distortion in the right upper lobe.  I have reviewed the images personally  CT chest [[facility]] 12/09/2022 Interval development of consolidation at the extreme right lung apex, which was not clearly within the radiation portal and has developed later than expected following radiation therapy. Suggest short-term follow-up versus PET/CT for further evaluation.   PET scan [[facility]] 12/22/2022 1.  Redemonstrated consolidation superimposed on right upper lobe radiation fibrosis with mild diffusely increased FDG uptake, favored secondary to infection/inflammation.  No evidence of recurrence or distant metastasis.  2.  Similar prominent mediastinal lymph nodes without substantial uptake, likely reactive.   PFTs:  Labs:  Assessment and Plan Chronic Obstructive Pulmonary Disease (COPD) Increased shortness of breath over the past month. History of lung cancer treated with radiation. Current smoker with difficulty using inhalers correctly.  -Increase Trelegy to  daily. -Order lung function test to assess severity of COPD. -Consider Pulmonary Rehabilitation program. -Order nebulizer with DuoNeb for as needed use.  Tobacco Use Disorder Current smoker with a pack and a half per day habit. Expressed interest in quitting. -Prescribe Chantix and nicotine patches at highest dose. -Encourage setting a quit date.  Lung Cancer History of stage 1A lung cancer treated with radiation in 2022. Regular follow-ups with oncology at [facility] every three months. -Continue regular follow-ups with oncology.  Chronic Kidney Disease (CKD) and Heart Disease Noted in conversation but no specific issues discussed. -No changes to current management.  Follow-up in 6 months with lung function test results.   Recommendations: Increase Trelegy dose to 200 PFTs DuoNebs as needed  Chilton Greathouse MD [facility] 05/05/2023, 9:45 AM  CC: Noni Saupe, MD

## 2023-05-11 NOTE — Telephone Encounter (Addendum)
[facility] patient VM box full Insurance plan will not cover otc medications. He will receive a denial letter from his insurance.

## 2023-06-28 ENCOUNTER — Other Ambulatory Visit: Payer: Self-pay

## 2023-06-28 ENCOUNTER — Inpatient Hospital Stay (HOSPITAL_COMMUNITY)
Admission: EM | Admit: 2023-06-28 | Discharge: 2023-07-07 | DRG: 189 | Disposition: A | Discharge status: Home Health | Attending: Family Medicine | Admitting: Family Medicine

## 2023-06-28 ENCOUNTER — Emergency Department (HOSPITAL_COMMUNITY)

## 2023-06-28 ENCOUNTER — Encounter (HOSPITAL_COMMUNITY): Payer: Self-pay

## 2023-06-28 DIAGNOSIS — D72829 Elevated white blood cell count, unspecified: Secondary | ICD-10-CM | POA: Diagnosis present

## 2023-06-28 DIAGNOSIS — E782 Mixed hyperlipidemia: Secondary | ICD-10-CM | POA: Diagnosis present

## 2023-06-28 DIAGNOSIS — N189 Chronic kidney disease, unspecified: Secondary | ICD-10-CM | POA: Insufficient documentation

## 2023-06-28 DIAGNOSIS — J9621 Acute and chronic respiratory failure with hypoxia: Secondary | ICD-10-CM | POA: Diagnosis not present

## 2023-06-28 DIAGNOSIS — J449 Chronic obstructive pulmonary disease, unspecified: Secondary | ICD-10-CM | POA: Diagnosis present

## 2023-06-28 DIAGNOSIS — Z884 Allergy status to anesthetic agent status: Secondary | ICD-10-CM

## 2023-06-28 DIAGNOSIS — Z880 Allergy status to penicillin: Secondary | ICD-10-CM

## 2023-06-28 DIAGNOSIS — Z923 Personal history of irradiation: Secondary | ICD-10-CM

## 2023-06-28 DIAGNOSIS — E1165 Type 2 diabetes mellitus with hyperglycemia: Secondary | ICD-10-CM | POA: Diagnosis not present

## 2023-06-28 DIAGNOSIS — G4700 Insomnia, unspecified: Secondary | ICD-10-CM | POA: Diagnosis present

## 2023-06-28 DIAGNOSIS — N179 Acute kidney failure, unspecified: Secondary | ICD-10-CM

## 2023-06-28 DIAGNOSIS — Z79899 Other long term (current) drug therapy: Secondary | ICD-10-CM

## 2023-06-28 DIAGNOSIS — N1831 Chronic kidney disease, stage 3a: Secondary | ICD-10-CM | POA: Insufficient documentation

## 2023-06-28 DIAGNOSIS — J439 Emphysema, unspecified: Secondary | ICD-10-CM | POA: Diagnosis present

## 2023-06-28 DIAGNOSIS — J9601 Acute respiratory failure with hypoxia: Secondary | ICD-10-CM | POA: Diagnosis not present

## 2023-06-28 DIAGNOSIS — M109 Gout, unspecified: Secondary | ICD-10-CM | POA: Insufficient documentation

## 2023-06-28 DIAGNOSIS — J441 Chronic obstructive pulmonary disease with (acute) exacerbation: Principal | ICD-10-CM

## 2023-06-28 DIAGNOSIS — N400 Enlarged prostate without lower urinary tract symptoms: Secondary | ICD-10-CM | POA: Diagnosis not present

## 2023-06-28 DIAGNOSIS — E119 Type 2 diabetes mellitus without complications: Secondary | ICD-10-CM

## 2023-06-28 DIAGNOSIS — I3139 Other pericardial effusion (noninflammatory): Secondary | ICD-10-CM | POA: Diagnosis present

## 2023-06-28 DIAGNOSIS — E1122 Type 2 diabetes mellitus with diabetic chronic kidney disease: Secondary | ICD-10-CM | POA: Diagnosis present

## 2023-06-28 DIAGNOSIS — D631 Anemia in chronic kidney disease: Secondary | ICD-10-CM | POA: Diagnosis present

## 2023-06-28 DIAGNOSIS — D509 Iron deficiency anemia, unspecified: Secondary | ICD-10-CM | POA: Diagnosis present

## 2023-06-28 DIAGNOSIS — Z801 Family history of malignant neoplasm of trachea, bronchus and lung: Secondary | ICD-10-CM

## 2023-06-28 DIAGNOSIS — E785 Hyperlipidemia, unspecified: Secondary | ICD-10-CM | POA: Insufficient documentation

## 2023-06-28 DIAGNOSIS — F17210 Nicotine dependence, cigarettes, uncomplicated: Secondary | ICD-10-CM | POA: Diagnosis present

## 2023-06-28 DIAGNOSIS — Z1152 Encounter for screening for COVID-19: Secondary | ICD-10-CM

## 2023-06-28 DIAGNOSIS — R918 Other nonspecific abnormal finding of lung field: Secondary | ICD-10-CM

## 2023-06-28 DIAGNOSIS — I10 Essential (primary) hypertension: Secondary | ICD-10-CM | POA: Diagnosis present

## 2023-06-28 DIAGNOSIS — Z7982 Long term (current) use of aspirin: Secondary | ICD-10-CM

## 2023-06-28 DIAGNOSIS — F1021 Alcohol dependence, in remission: Secondary | ICD-10-CM | POA: Diagnosis present

## 2023-06-28 DIAGNOSIS — F419 Anxiety disorder, unspecified: Secondary | ICD-10-CM | POA: Diagnosis present

## 2023-06-28 DIAGNOSIS — Z888 Allergy status to other drugs, medicaments and biological substances status: Secondary | ICD-10-CM

## 2023-06-28 DIAGNOSIS — E8809 Other disorders of plasma-protein metabolism, not elsewhere classified: Secondary | ICD-10-CM | POA: Diagnosis present

## 2023-06-28 DIAGNOSIS — T380X5A Adverse effect of glucocorticoids and synthetic analogues, initial encounter: Secondary | ICD-10-CM | POA: Diagnosis present

## 2023-06-28 DIAGNOSIS — Z7951 Long term (current) use of inhaled steroids: Secondary | ICD-10-CM

## 2023-06-28 DIAGNOSIS — Z794 Long term (current) use of insulin: Secondary | ICD-10-CM

## 2023-06-28 DIAGNOSIS — I129 Hypertensive chronic kidney disease with stage 1 through stage 4 chronic kidney disease, or unspecified chronic kidney disease: Secondary | ICD-10-CM | POA: Diagnosis present

## 2023-06-28 DIAGNOSIS — B9781 Human metapneumovirus as the cause of diseases classified elsewhere: Secondary | ICD-10-CM | POA: Diagnosis present

## 2023-06-28 DIAGNOSIS — E872 Acidosis, unspecified: Secondary | ICD-10-CM | POA: Diagnosis not present

## 2023-06-28 DIAGNOSIS — Z85118 Personal history of other malignant neoplasm of bronchus and lung: Secondary | ICD-10-CM

## 2023-06-28 LAB — GLUCOSE, CAPILLARY
Glucose-Capillary: 261 mg/dL — ABNORMAL HIGH (ref 70–99)
Glucose-Capillary: 265 mg/dL — ABNORMAL HIGH (ref 70–99)

## 2023-06-28 LAB — CBC WITH DIFFERENTIAL/PLATELET
Abs Immature Granulocytes: 0.03 10*3/uL (ref 0.00–0.07)
Basophils Absolute: 0.1 10*3/uL (ref 0.0–0.1)
Basophils Relative: 1 %
Eosinophils Absolute: 0 10*3/uL (ref 0.0–0.5)
Eosinophils Relative: 0 %
HCT: 33.6 % — ABNORMAL LOW (ref 39.0–52.0)
Hemoglobin: 11 g/dL — ABNORMAL LOW (ref 13.0–17.0)
Immature Granulocytes: 0 %
Lymphocytes Relative: 4 %
Lymphs Abs: 0.4 10*3/uL — ABNORMAL LOW (ref 0.7–4.0)
MCH: 32 pg (ref 26.0–34.0)
MCHC: 32.7 g/dL (ref 30.0–36.0)
MCV: 97.7 fL (ref 80.0–100.0)
Monocytes Absolute: 0.6 10*3/uL (ref 0.1–1.0)
Monocytes Relative: 6 %
Neutro Abs: 8.6 10*3/uL — ABNORMAL HIGH (ref 1.7–7.7)
Neutrophils Relative %: 89 %
Platelets: 241 10*3/uL (ref 150–400)
RBC: 3.44 MIL/uL — ABNORMAL LOW (ref 4.22–5.81)
RDW: 15.7 % — ABNORMAL HIGH (ref 11.5–15.5)
WBC: 9.7 10*3/uL (ref 4.0–10.5)
nRBC: 0 % (ref 0.0–0.2)

## 2023-06-28 LAB — BRAIN NATRIURETIC PEPTIDE: B Natriuretic Peptide: 116.4 pg/mL — ABNORMAL HIGH (ref 0.0–100.0)

## 2023-06-28 LAB — BASIC METABOLIC PANEL
Anion gap: 10 (ref 5–15)
BUN: 44 mg/dL — ABNORMAL HIGH (ref 8–23)
CO2: 18 mmol/L — ABNORMAL LOW (ref 22–32)
Calcium: 8.8 mg/dL — ABNORMAL LOW (ref 8.9–10.3)
Chloride: 111 mmol/L (ref 98–111)
Creatinine, Ser: 2.7 mg/dL — ABNORMAL HIGH (ref 0.61–1.24)
GFR, Estimated: 23 mL/min — ABNORMAL LOW (ref >60)
Glucose, Bld: 283 mg/dL — ABNORMAL HIGH (ref 70–99)
Potassium: 4.4 mmol/L (ref 3.5–5.1)
Sodium: 139 mmol/L (ref 135–145)

## 2023-06-28 LAB — RESP PANEL BY RT-PCR (RSV, FLU A&B, COVID)  RVPGX2
Influenza A by PCR: NEGATIVE
Influenza B by PCR: NEGATIVE
Resp Syncytial Virus by PCR: NEGATIVE
SARS Coronavirus 2 by RT PCR: NEGATIVE

## 2023-06-28 LAB — D-DIMER, QUANTITATIVE: D-Dimer, Quant: 2.79 ug{FEU}/mL — ABNORMAL HIGH (ref 0.00–0.50)

## 2023-06-28 MED ORDER — TAMSULOSIN HCL 0.4 MG PO CAPS
0.8000 mg | ORAL_CAPSULE | Freq: Every day | ORAL | Status: DC
  Administered 2023-06-28 – 2023-07-06 (×9): 0.8 mg via ORAL
  Filled 2023-06-28 (×9): qty 2

## 2023-06-28 MED ORDER — SODIUM CHLORIDE 0.9% FLUSH
3.0000 mL | Freq: Two times a day (BID) | INTRAVENOUS | Status: DC
  Administered 2023-06-28 – 2023-07-07 (×18): 3 mL via INTRAVENOUS

## 2023-06-28 MED ORDER — FINASTERIDE 5 MG PO TABS
5.0000 mg | ORAL_TABLET | Freq: Every day | ORAL | Status: DC
  Administered 2023-06-29 – 2023-07-07 (×9): 5 mg via ORAL
  Filled 2023-06-28 (×9): qty 1

## 2023-06-28 MED ORDER — SIMVASTATIN 20 MG PO TABS
10.0000 mg | ORAL_TABLET | Freq: Every day | ORAL | Status: DC
  Administered 2023-06-28 – 2023-07-06 (×9): 10 mg via ORAL
  Filled 2023-06-28 (×9): qty 1

## 2023-06-28 MED ORDER — ACETAMINOPHEN 650 MG RE SUPP: 650.0000 mg | Freq: Four times a day (QID) | RECTAL | Status: DC | PRN

## 2023-06-28 MED ORDER — FLUTICASONE FUROATE-VILANTEROL 200-25 MCG/ACT IN AEPB
1.0000 | INHALATION_SPRAY | Freq: Every day | RESPIRATORY_TRACT | Status: DC
  Administered 2023-06-29: 1 via RESPIRATORY_TRACT
  Filled 2023-06-28: qty 28

## 2023-06-28 MED ORDER — IPRATROPIUM BROMIDE 0.02 % IN SOLN
0.5000 mg | Freq: Four times a day (QID) | RESPIRATORY_TRACT | Status: DC
  Administered 2023-06-28 – 2023-06-29 (×2): 0.5 mg via RESPIRATORY_TRACT
  Filled 2023-06-28 (×2): qty 2.5

## 2023-06-28 MED ORDER — INSULIN ASPART 100 UNIT/ML IJ SOLN
0.0000 [IU] | Freq: Three times a day (TID) | INTRAMUSCULAR | Status: DC
  Administered 2023-06-28: 8 [IU] via SUBCUTANEOUS
  Administered 2023-06-29: 15 [IU] via SUBCUTANEOUS

## 2023-06-28 MED ORDER — HEPARIN SODIUM (PORCINE) 5000 UNIT/ML IJ SOLN
5000.0000 [IU] | Freq: Three times a day (TID) | INTRAMUSCULAR | Status: DC
  Administered 2023-06-28 – 2023-07-07 (×27): 5000 [IU] via SUBCUTANEOUS
  Filled 2023-06-28 (×27): qty 1

## 2023-06-28 MED ORDER — IPRATROPIUM-ALBUTEROL 0.5-2.5 (3) MG/3ML IN SOLN
3.0000 mL | Freq: Once | RESPIRATORY_TRACT | Status: AC
  Administered 2023-06-28: 3 mL via RESPIRATORY_TRACT
  Filled 2023-06-28: qty 3

## 2023-06-28 MED ORDER — SODIUM CHLORIDE 0.9 % IV SOLN: INTRAVENOUS | Status: DC

## 2023-06-28 MED ORDER — GUAIFENESIN-DM 100-10 MG/5ML PO SYRP
5.0000 mL | ORAL_SOLUTION | ORAL | Status: DC | PRN
  Administered 2023-06-28 – 2023-07-05 (×4): 5 mL via ORAL
  Filled 2023-06-28 (×2): qty 5
  Filled 2023-06-28: qty 10
  Filled 2023-06-28: qty 5

## 2023-06-28 MED ORDER — METHYLPREDNISOLONE SODIUM SUCC 125 MG IJ SOLR
125.0000 mg | Freq: Once | INTRAMUSCULAR | Status: AC
  Administered 2023-06-28: 125 mg via INTRAVENOUS
  Filled 2023-06-28: qty 2

## 2023-06-28 MED ORDER — POLYETHYLENE GLYCOL 3350 17 G PO PACK
17.0000 g | PACK | Freq: Every day | ORAL | Status: DC | PRN
  Filled 2023-06-28: qty 1

## 2023-06-28 MED ORDER — ALBUTEROL SULFATE (2.5 MG/3ML) 0.083% IN NEBU
2.5000 mg | INHALATION_SOLUTION | Freq: Every day | RESPIRATORY_TRACT | Status: DC | PRN
  Administered 2023-06-29 – 2023-06-30 (×3): 2.5 mg via RESPIRATORY_TRACT
  Filled 2023-06-28 (×4): qty 3

## 2023-06-28 MED ORDER — UMECLIDINIUM BROMIDE 62.5 MCG/ACT IN AEPB
1.0000 | INHALATION_SPRAY | Freq: Every day | RESPIRATORY_TRACT | Status: DC
  Administered 2023-06-29: 1 via RESPIRATORY_TRACT
  Filled 2023-06-28: qty 7

## 2023-06-28 MED ORDER — AMLODIPINE BESYLATE 10 MG PO TABS
10.0000 mg | ORAL_TABLET | Freq: Every day | ORAL | Status: DC
  Administered 2023-06-29 – 2023-07-07 (×9): 10 mg via ORAL
  Filled 2023-06-28 (×9): qty 1

## 2023-06-28 MED ORDER — NICOTINE 21 MG/24HR TD PT24
21.0000 mg | MEDICATED_PATCH | Freq: Every day | TRANSDERMAL | Status: DC
  Administered 2023-06-28 – 2023-07-07 (×10): 21 mg via TRANSDERMAL
  Filled 2023-06-28 (×10): qty 1

## 2023-06-28 MED ORDER — PREDNISONE 20 MG PO TABS
50.0000 mg | ORAL_TABLET | Freq: Every day | ORAL | Status: DC
  Administered 2023-06-29: 50 mg via ORAL
  Filled 2023-06-28: qty 1

## 2023-06-28 MED ORDER — SODIUM CHLORIDE 0.9 % IV BOLUS
1000.0000 mL | Freq: Once | INTRAVENOUS | Status: AC
Start: 2023-06-28 — End: 2023-06-28
  Administered 2023-06-28: 1000 mL via INTRAVENOUS

## 2023-06-28 MED ORDER — ACETAMINOPHEN 325 MG PO TABS
650.0000 mg | ORAL_TABLET | Freq: Four times a day (QID) | ORAL | Status: DC | PRN
  Filled 2023-06-28: qty 2

## 2023-06-28 NOTE — H&P (Signed)
 History and Physical   Jeffrey Randall. ZOX:[id number] DOB: 05-01-1940 DOA: 06/28/2023  PCP: Noni Saupe, MD   Patient coming from: Home  Chief Complaint: Shortness of breath  HPI: Jeffrey Randall. is a 83 y.o. male with medical history significant of hypertension, diabetes, COPD, lung cancer s/p radiation, alcoholism in remission, CKD 3 A, gout, hyperlipidemia, BPH presenting with worsening shortness of breath.  Patient reports has had several months of shortness of breath with some intermittently productive cough.  States that for the past day he has had significantly increased shortness of breath with dyspnea on exertion with only walking short distances.  Also has had decreased p.o. intake recently.  Tried nebulizer treatment at home with minimal improvement.  As above does have history of lung cancer status post radiation treatment in 2022.  Has a 70-year smoking history and continues to smoke 1 to 1.5 packs/day.  Denies fevers, chills, chest pain, abdominal pain, constipation, diarrhea, nausea, vomiting.  ED Course: Vital signs in the ED notable for blood pressure in the 150s to 160s systolic, respiratory rate in the 20s, desaturating to the 80s on room air requiring 2 L to maintain saturations.  Lab workup included BMP with bicarb 18, BUN 44, creatinine elevated to 2.7 from baseline of 1.4, glucose 283, calcium 8.8.  CBC with hemoglobin of 11 down from 14 a year ago.  BNP indeterminate at 116.  D-dimer elevated to 2.79.  Respiratory panel for flu COVID and RSV pending.  Chest x-ray with probable irregular solid density at the right upper lung apex with CT recommended to further evaluate.  Review of Systems: As per HPI otherwise all other systems reviewed and are negative.  Past Medical History:  Diagnosis Date   Alcoholism in recovery [facility])    Aortic atherosclerosis (HCC)    Back pain 06/17/2014   Chronic lung disease    CKD (chronic kidney disease)    COPD (chronic  obstructive pulmonary disease) (HCC) 07/23/2014   Essential hypertension    Gout    History of lung cancer    History of pancreatitis    Iron deficiency anemia    Lumbar disc disease    Mixed hyperlipidemia    Notalgia    Prostate hypertrophy    Pulmonary nodules 07/23/2014   Swelling    Tobacco use disorder 07/23/2014   Tumor of lung 01/26/2021   Type 2 diabetes mellitus without complication (HCC)    Uncontrolled type 2 diabetes mellitus with hyperglycemia, without long-term current use of insulin (HCC)     Past Surgical History:  Procedure Laterality Date   BACK SURGERY      Social History  reports that he has been smoking cigarettes. He started smoking about 61 years ago. He has a 90 pack-year smoking history. He has never used smokeless tobacco. He reports that he does not currently use alcohol after a past usage of about 3.0 standard drinks of alcohol per week. He reports that he does not use drugs.  Allergies  Allergen Reactions   Chantix Continuing Month [Varenicline] Other (See Comments)    Doesn't work for patient   Januvia [Sitagliptin] Other (See Comments)    Class Contraindicated   Lisinopril Swelling    Possible tongue swelling. ARB's okay   Motrin [Ibuprofen] Other (See Comments)    Contraindicated by Renal disease   Remeron [Mirtazapine] Other (See Comments)    Excess somnolence on the 15 mg dose   Penicillins Other (See Comments)  Facial swelling    Family History  Problem Relation Age of Onset   Lung cancer Mother    Lung cancer Father   Reviewed on her  Prior to Admission medications   Medication Sig Start Date End Date Taking? Authorizing Provider  albuterol (VENTOLIN HFA) 108 (90 Base) MCG/ACT inhaler Inhale 1 puff into the lungs daily as needed for wheezing or shortness of breath. 09/01/21  Yes [provider]  allopurinol (ZYLOPRIM) 100 MG tablet Take 100 mg by mouth every morning. 02/25/22  Yes [provider]  amLODipine  (NORVASC) 10 MG tablet Take 10 mg by mouth daily.   Yes [provider]  aspirin EC 81 MG tablet Take 1 tablet (81 mg total) by mouth daily. Swallow whole. 05/03/22  Yes Georgeanna Lea, MD  Cyanocobalamin (VITAMIN B-12) 2500 MCG SUBL Place 2,500 mcg under the tongue daily.   Yes [provider]  dapagliflozin propanediol (FARXIGA) 10 MG TABS tablet Take 1 tablet by mouth daily. 09/03/21  Yes [provider]  finasteride (PROSCAR) 5 MG tablet Take 5 mg by mouth daily. 02/25/22  Yes [provider]  Fluticasone-Umeclidin-Vilant (TRELEGY ELLIPTA) 200-62.5-25 MCG/ACT AEPB Inhale 1 puff into the lungs daily. 05/05/23 05/04/24 Yes Mannam, Praveen, MD  insulin aspart protamine - aspart (NOVOLOG 70/30 FLEXPEN) (70-30) 100 UNIT/ML FlexPen Inject 2-14 Units into the skin in the morning, at noon, and at bedtime.   Yes [provider]  ipratropium-albuterol (DUONEB) 0.5-2.5 (3) MG/3ML SOLN Take 3 mLs by nebulization every 4 (four) hours as needed. Patient taking differently: Take 3 mLs by nebulization daily as needed (wheezing/sob). 05/05/23 05/04/24 Yes Mannam, Praveen, MD  Omega-3 Fatty Acids (FISH OIL) 1200 MG CAPS Take 1,200 mg by mouth daily.   Yes [provider]  simvastatin (ZOCOR) 10 MG tablet Take 10 mg by mouth at bedtime. 02/25/22  Yes [provider]  tamsulosin (FLOMAX) 0.4 MG CAPS capsule Take 0.8 mg by mouth at bedtime. 02/25/22  Yes [provider]  valsartan-hydrochlorothiazide (DIOVAN-HCT) 320-25 MG tablet Take 1 tablet by mouth daily. 04/28/22  Yes [provider]  Albuterol Sulfate 2.5 MG/0.5ML NEBU Inhale into the lungs. Patient not taking: Reported on 06/28/2023    [provider]  Continuous Glucose Sensor (FREESTYLE LIBRE 3 PLUS SENSOR) MISC Use as directed and change every 14 (fourteen) days. 02/15/23     HUMALOG MIX 75/25 KWIKPEN (75-25) 100 UNIT/ML KwikPen 2-8 units twice daily before breakfast and  dinner Subcutaneous Patient not taking: Reported on 06/28/2023 05/13/23   [provider]  Insulin Degludec (TRESIBA) 100 UNIT/ML SOLN Inject 18 mLs into the skin daily. Patient not taking: Reported on 06/28/2023    [provider]  nicotine (NICODERM CQ - DOSED IN MG/24 HOURS) 21 mg/24hr patch Place 1 patch (21 mg total) onto the skin daily. Patient not taking: Reported on 06/28/2023 05/05/23   Chilton Greathouse, MD  RYBELSUS 7 MG TABS 1 tablet at least 30 minutes before first food, beverage or other oral medicine of the day Orally Once a day for 90 days Patient not taking: Reported on 06/28/2023 01/10/23   [provider]  Varenicline Tartrate, Starter, (CHANTIX STARTING MONTH PAK) 0.5 MG X 11 & 1 MG X 42 TBPK Take 1 tablet by mouth daily. Patient not taking: Reported on 06/28/2023 05/05/23   Chilton Greathouse, MD    Physical Exam: Vitals:   06/28/23 1345 06/28/23 1400 06/28/23 1415 06/28/23 1430  BP: (!) 160/59 (!) 158/64 (!) 158/61 Marland Kitchen)  160/65  Pulse: 80 80 76 75  Resp: (!) 23 (!) 23 (!) 22 (!) 21  Temp:      SpO2: 99% 100% 100% 99%  Weight:      Height:        Physical Exam Constitutional:      General: He is not in acute distress.    Appearance: Normal appearance.     Comments: Tired appearing elderly male  HENT:     Head: Normocephalic and atraumatic.     Mouth/Throat:     Mouth: Mucous membranes are moist.     Pharynx: Oropharynx is clear.  Eyes:     Extraocular Movements: Extraocular movements intact.     Pupils: Pupils are equal, round, and reactive to light.  Cardiovascular:     Rate and Rhythm: Normal rate and regular rhythm.     Pulses: Normal pulses.     Heart sounds: Normal heart sounds.  Pulmonary:     Effort: Pulmonary effort is normal. No respiratory distress.     Breath sounds: Wheezing present.  Abdominal:     General: Bowel sounds are normal. There is no distension.     Palpations: Abdomen is soft.     Tenderness: There is no abdominal  tenderness.  Musculoskeletal:        General: No swelling or deformity.  Skin:    General: Skin is warm and dry.  Neurological:     General: No focal deficit present.     Mental Status: Mental status is at baseline.    Labs on Admission: I have personally reviewed following labs and imaging studies  CBC: Recent Labs  Lab 06/28/23 1335  WBC 9.7  NEUTROABS 8.6*  HGB 11.0*  HCT 33.6*  MCV 97.7  PLT 241    Basic Metabolic Panel: Recent Labs  Lab 06/28/23 1335  NA 139  K 4.4  CL 111  CO2 18*  GLUCOSE 283*  BUN 44*  CREATININE 2.70*  CALCIUM 8.8*    GFR: Estimated Creatinine Clearance: 18.9 mL/min (A) (by C-G formula based on SCr of 2.7 mg/dL (H)).  Liver Function Tests: No results for input(s): "AST", "ALT", "ALKPHOS", "BILITOT", "PROT", "ALBUMIN" in the last 168 hours.  Urine analysis:    Component Value Date/Time   COLORURINE YELLOW 06/17/2014 1257   APPEARANCEUR CLEAR 06/17/2014 1257   LABSPEC 1.020 06/17/2014 1257   PHURINE 6.5 06/17/2014 1257   GLUCOSEU 250 (A) 06/17/2014 1257   HGBUR NEGATIVE 06/17/2014 1257   BILIRUBINUR NEGATIVE 06/17/2014 1257   KETONESUR NEGATIVE 06/17/2014 1257   PROTEINUR 30 (A) 06/17/2014 1257   UROBILINOGEN 0.2 06/17/2014 1257   NITRITE NEGATIVE 06/17/2014 1257   LEUKOCYTESUR NEGATIVE 06/17/2014 1257    Radiological Exams on Admission: DG Chest Port 1 View Result Date: 06/28/2023 CLINICAL DATA:  Shortness of breath.  Productive cough. EXAM: PORTABLE CHEST 1 VIEW COMPARISON:  June 08, 2021. FINDINGS: The heart size and mediastinal contours are within normal limits. Left midlung and basilar opacity is noted concerning for possible atelectasis or scarring which was present on prior exam. However, probable irregular solid density is seen in right lung apex. The visualized skeletal structures are unremarkable. IMPRESSION: Probable irregular solid density seen in right lung apex. CT scan of the chest is recommended to evaluate for  possible neoplasm or malignancy. Electronically Signed   By: Lupita Raider M.D.   On: 06/28/2023 14:54   EKG: Independently reviewed.  Sinus rhythm at 77 beats.  Significant baseline wander.  Baseline  artifact multiple leads.  Low voltage multiple leads.  J-point elevation suspected LVH with repolarization abnormality but difficult to interpret with degree of wander.  Assessment/Plan Principal Problem:   Acute respiratory failure with hypoxia (HCC) Active Problems:   Essential hypertension   Type 2 diabetes mellitus without complication (HCC)   COPD (chronic obstructive pulmonary disease) (HCC)   Acute renal failure superimposed on stage 3a chronic kidney disease (HCC)   BPH (benign prostatic hyperplasia)   Hyperlipidemia   Gout   History of lung cancer   Acute respiratory failure with hypoxia ?COPD exacerbation Rule out pulmonary embolism > Patient with some chronic shortness of breath last 3 to 4 months worse for the last 1 day. > Unclear etiology.  Possibly viral versus pulmonary embolism in the setting of elevated D-dimer.  Unable to get CTA PE study in the ED due to AKI, VQ scan has been ordered. > Respiratory panel is pending.  No leukocytosis to indicate bacterial etiology.   > Chest x-ray did show irregularity/solid density at the right lung apex recommending possible CT to follow-up.  However, patient had consolidation at this location noted on CT in August with read Care Everywhere.  PET scan was done in follow-up considering patient's history of lung cancer which showed consolidation superimposed on the right upper lobe radiation fibrosis favoring infection versus inflammation but no evidence of recurrence of lung cancer or metastatic disease. > Requiring 2 L to maintain saturations.  Received DuoNeb in the ED.  Does have baseline COPD and has some wheezing. - Monitor on telemetry overnight - Add on steroids - Scheduled Atrovent - As needed albuterol - Replace home Trelegy  with formulary Breo and Incruse - Follow-up VQ scan - Follow-up respiratory viral screening, follow-up with full RVP if negative  AKI on CKD 3A > Creatinine elevated to 2.7 from previous baseline of 1.4 (though this was 2 years ago) > Did reportedly have some recent decreased p.o. intake. > Received 1 L in the ED. - Continue IV fluids - Trend renal function and electrolytes  History of lung cancer > Status post radiation treatment in 2022.  As above had CT with abnormality last year and follow-up PET scan showed no recurrence of disease. - Noted  Hypertension - Continue home amlodipine - Holding valsartan/hydrochlorothiazide in the setting of AKI  Diabetes > Home medication is 70/30 insulin SSI TID - SSI - Monitor for hyperglycemia with steroids for COPD  Gout - Holding allopurinol in the setting of AKI  BPH - Continue finasteride and tamsulosin  Hyperlipidemia - Continue home simvastatin  DVT prophylaxis: Heparin Code Status:   Full Family Communication:  Updated at bedside  Disposition Plan:   Patient is from:  Home  Anticipated DC to:  Home  Anticipated DC date:  1 to 3 days  Anticipated DC barriers: None  Consults called:  None Admission status:  Observation, telemetry  Severity of Illness: The appropriate patient status for this patient is OBSERVATION. Observation status is judged to be reasonable and necessary in order to provide the required intensity of service to ensure the patient's safety. The patient's presenting symptoms, physical exam findings, and initial radiographic and laboratory data in the context of their medical condition is felt to place them at decreased risk for further clinical deterioration. Furthermore, it is anticipated that the patient will be medically stable for discharge from the hospital within 2 midnights of admission.    Synetta Fail MD [facility]  How to contact the [facility]  Attending or Consulting provider 7A - 7P or  covering provider during after hours 7P -7A, for this patient?   Check the care team in [facility] and look for a) attending/consulting [facility] provider listed and b) the [place] team listed Log into [email or web address] and use [place]'s universal password to access. If you do not have the password, please contact the hospital operator. Locate the [facility] provider you are looking for under [facility] and page to a number that you can be directly reached. If you still have difficulty reaching the provider, please page the [facility] (Director on Call) for the Hospitalists listed on amion for assistance.  06/28/2023, 3:33 PM

## 2023-06-28 NOTE — ED Triage Notes (Signed)
 Pt was dropped off at main entrance and rapid response nurse brought pt to ED. Per rapid response nurse pt Sa02 was 87-91%, but when she was checking him in at registration his Sa02 dropped to 75% and hr 40's, per the nurse it "took awhile for the pt to come back up.

## 2023-06-28 NOTE — ED Provider Notes (Addendum)
 [place] EMERGENCY DEPARTMENT AT [facility] Provider Note   CSN: [id number] Arrival date & time: 06/28/23  1210     History  Chief Complaint  Patient presents with   Shortness of Breath    Jeffrey Elza. is a 83 y.o. male.  The history is provided by the patient and medical records. No language interpreter was used.  Shortness of Breath    Patient is an 83 y.o. male with a PMH of lung cancer, COPD, T2DM presenting to the ER today with shortness of breath. He states that he has been experiencing SOB and a mildly productive cough for the last 3-4 months, however, in the last day he has become exceptionally short of breath. Patient endorses exertional dyspnea with tasks that typically do not illicit such sxs, such as walking from his bedroom to the bathroom. Patient does not use oxygen supplementation at baseline. He states that he used a nebulizer this morning with minimal improvement to his sxs. He currently denies orthopnea, edema, CP, palpitations, dizziness, fever, chills, N/V/D. Patient currently smokes about 1-1.5 packs of cigarettes daily and has been a smoker for the last 70 years.   Home Medications Prior to Admission medications   Medication Sig Start Date End Date Taking? Authorizing Provider  HUMALOG MIX 75/25 KWIKPEN (75-25) 100 UNIT/ML KwikPen 2-8 units twice daily before breakfast and dinner Subcutaneous 05/13/23  Yes [provider]  RYBELSUS 7 MG TABS 1 tablet at least 30 minutes before first food, beverage or other oral medicine of the day Orally Once a day for 90 days 01/10/23  Yes [provider]  albuterol (VENTOLIN HFA) 108 (90 Base) MCG/ACT inhaler Inhale 2 puffs into the lungs 3 (three) times daily as needed for wheezing or shortness of breath. 09/01/21   [provider]  allopurinol (ZYLOPRIM) 100 MG tablet Take 100 mg by mouth every morning. 02/25/22   [provider]  amLODipine (NORVASC) 5 MG tablet Take 5 mg  by mouth at bedtime. 02/25/22   [provider]  aspirin EC 81 MG tablet Take 1 tablet (81 mg total) by mouth daily. Swallow whole. 05/03/22   Georgeanna Lea, MD  Continuous Glucose Sensor (FREESTYLE LIBRE 3 PLUS SENSOR) MISC Use as directed and change every 14 (fourteen) days. 02/15/23     Cyanocobalamin (VITAMIN B-12) 2500 MCG SUBL Place 1 tablet under the tongue daily.    [provider]  dapagliflozin propanediol (FARXIGA) 10 MG TABS tablet Take 1 tablet by mouth daily. 09/03/21   [provider]  finasteride (PROSCAR) 5 MG tablet Take 5 mg by mouth daily. 02/25/22   [provider]  Fluticasone-Umeclidin-Vilant (TRELEGY ELLIPTA) 200-62.5-25 MCG/ACT AEPB Inhale 1 puff into the lungs daily. 05/05/23 05/04/24  Mannam, Colbert Coyer, MD  Insulin Degludec (TRESIBA) 100 UNIT/ML SOLN Inject 18 mLs into the skin daily.    [provider]  ipratropium-albuterol (DUONEB) 0.5-2.5 (3) MG/3ML SOLN Take 3 mLs by nebulization every 4 (four) hours as needed. 05/05/23 05/04/24  Mannam, Colbert Coyer, MD  nicotine (NICODERM CQ - DOSED IN MG/24 HOURS) 21 mg/24hr patch Place 1 patch (21 mg total) onto the skin daily. 05/05/23   Mannam, Colbert Coyer, MD  Omega-3 Fatty Acids (FISH OIL) 1200 MG CAPS Take 1,200 mg by mouth 2 (two) times daily.     [provider]  simvastatin (ZOCOR) 10 MG tablet Take 10 mg by mouth at bedtime. 02/25/22   [provider]  tamsulosin (FLOMAX) 0.4 MG CAPS capsule Take  0.8 mg by mouth at bedtime. 02/25/22   [provider]  valsartan-hydrochlorothiazide (DIOVAN-HCT) 320-25 MG tablet Take 1 tablet by mouth daily. 04/28/22   [provider]  Varenicline Tartrate, Starter, (CHANTIX STARTING MONTH PAK) 0.5 MG X 11 & 1 MG X 42 TBPK Take 1 tablet by mouth daily. 05/05/23   Chilton Greathouse, MD      Allergies    Chantix continuing month [varenicline], Januvia [sitagliptin], Lisinopril, Motrin [ibuprofen], Remeron [mirtazapine], and  Penicillins    Review of Systems   Review of Systems  Respiratory:  Positive for shortness of breath.   All other systems reviewed and are negative.   Physical Exam Updated Vital Signs BP (!) 153/68 (BP Location: Right Arm)   Pulse 82   Temp 97.7 F (36.5 C)   Resp (!) 30   Ht 5\' 10"  (1.778 m)   Wt 64.3 kg   SpO2 95%   BMI 20.34 kg/m  Physical Exam Constitutional:      General: He is not in acute distress.    Appearance: He is well-developed.  HENT:     Head: Atraumatic.  Eyes:     Conjunctiva/sclera: Conjunctivae normal.  Neck:     Vascular: No JVD.  Cardiovascular:     Rate and Rhythm: Normal rate and regular rhythm.     Pulses: Normal pulses.     Heart sounds: Normal heart sounds.  Pulmonary:     Breath sounds: Wheezing and rhonchi present.  Abdominal:     Palpations: Abdomen is soft.  Musculoskeletal:     Cervical back: Normal range of motion and neck supple.     Right lower leg: No edema.     Left lower leg: No edema.  Skin:    Findings: No rash.  Neurological:     Mental Status: He is alert and oriented to person, place, and time.     ED Results / Procedures / Treatments   Labs (all labs ordered are listed, but only abnormal results are displayed) Labs Reviewed  BASIC METABOLIC PANEL - Abnormal; Notable for the following components:      Result Value   CO2 18 (*)    Glucose, Bld 283 (*)    BUN 44 (*)    Creatinine, Ser 2.70 (*)    Calcium 8.8 (*)    GFR, Estimated 23 (*)    All other components within normal limits  CBC WITH DIFFERENTIAL/PLATELET - Abnormal; Notable for the following components:   RBC 3.44 (*)    Hemoglobin 11.0 (*)    HCT 33.6 (*)    RDW 15.7 (*)    Neutro Abs 8.6 (*)    Lymphs Abs 0.4 (*)    All other components within normal limits  BRAIN NATRIURETIC PEPTIDE - Abnormal; Notable for the following components:   B Natriuretic Peptide 116.4 (*)    All other components within normal limits  D-DIMER, QUANTITATIVE - Abnormal;  Notable for the following components:   D-Dimer, Quant 2.79 (*)    All other components within normal limits  RESP PANEL BY RT-PCR (RSV, FLU A&B, COVID)  [id number]    EKG EKG Interpretation Date/Time:  Tuesday June 28 2023 12:20:05 EDT Ventricular Rate:  77 PR Interval:  124 QRS Duration:  90 QT Interval:  384 QTC Calculation: 434 R Axis:   105  Text Interpretation: Normal sinus rhythm Lateral infarct , age undetermined ST & T wave abnormality, consider inferior ischemia Abnormal ECG Baseline wander TECHNICALLY DIFFICULT Confirmed by Adela Lank,  Jesusita Oka [phone]) on 06/28/2023 12:36:19 PM  Radiology DG Chest Port 1 View Result Date: 06/28/2023 CLINICAL DATA:  Shortness of breath.  Productive cough. EXAM: PORTABLE CHEST 1 VIEW COMPARISON:  June 08, 2021. FINDINGS: The heart size and mediastinal contours are within normal limits. Left midlung and basilar opacity is noted concerning for possible atelectasis or scarring which was present on prior exam. However, probable irregular solid density is seen in right lung apex. The visualized skeletal structures are unremarkable. IMPRESSION: Probable irregular solid density seen in right lung apex. CT scan of the chest is recommended to evaluate for possible neoplasm or malignancy. Electronically Signed   By: Lupita Raider M.D.   On: 06/28/2023 14:54    Procedures Procedures    Medications Ordered in ED Medications  sodium chloride 0.9 % bolus 1,000 mL (has no administration in time range)  ipratropium-albuterol (DUONEB) 0.5-2.5 (3) MG/3ML nebulizer solution 3 mL (3 mLs Nebulization Given 06/28/23 1320)    ED Course/ Medical Decision Making/ A&P Clinical Course as of 06/28/23 1444  Tue Jun 28, 2023  1440 Basic metabolic panel(!) [RT]    Clinical Course User Index [RT] Jeffrey Randall, Jeffrey Randall, Student-PA                                 Medical Decision Making Amount and/or Complexity of Data Reviewed Labs: ordered. Radiology:  ordered.  Risk Prescription drug management. Decision regarding hospitalization.   BP (!) 153/68 (BP Location: Right Arm)   Pulse 82   Temp 97.7 F (36.5 C)   Resp (!) 30   Ht 5\' 10"  (1.778 m)   Wt 64.3 kg   SpO2 95%   BMI 20.34 kg/m   1:06 PM Patient is an 83 y.o. male with a PMH of lung cancer, COPD, T2DM presenting to the ER today with shortness of breath. He states that he has been experiencing SOB and a mildly productive cough for the last 3-4 months, however, in the last day he has become exceptionally short of breath. Patient endorses exertional dyspnea with tasks that typically do not illicit such sxs, such as walking from his bedroom to the bathroom. Patient does not use oxygen supplementation at baseline. He states that he used a nebulizer this morning with minimal improvement to his sxs. He currently denies orthopnea, edema, CP, palpitations, dizziness, fever, chills, N/V/D. Patient currently smokes about 1-1.5 packs of cigarettes daily and has been a smoker for the last 70 years.   On exam this is a frail-appearing elderly male laying in bed appears to be in no acute discomfort.  He is mildly tachypneic with decreased lung sounds and occasional wheezes.  He does not have any signs of peripheral edema.  No JVD and no leg swelling.  He is globally weak but equal strength throughout.  He is mentating appropriately.  Vital signs shows no fever but patient is at 95% on 2 L of oxygen.  -Labs ordered, independently viewed and interpreted by me.  Labs remarkable for elevated d-dimer of 2.79.  chest CTA ordered to r/o PE.  However, pt has impaired kidney function with BUN 44 and Cr 2.7.  CTA cancelled, will order VQ scan.  BNP elevated at 116. -The patient was maintained on a cardiac monitor.  I personally viewed and interpreted the cardiac monitored which showed an underlying rhythm of: NSR -Imaging independently viewed and interpreted by me and I agree with radiologist's  interpretation.  Result remarkable for CXR showing a mass to R lung that would benefit from CT for further delineation.   -This patient presents to the ED for concern of SOB, this involves an extensive number of treatment options, and is a complaint that carries with it a high risk of complications and morbidity.  The differential diagnosis includes COPD exacerbation, CHF, PE, PNA, viral illness, anemia -Co morbidities that complicate the patient evaluation includes lung CA, COPD, DM -Treatment includes supplemental O2, duonebs -Reevaluation of the patient after these medicines showed that the patient improved -PCP office notes or outside notes reviewed -Discussion with oncoming team who will consult for admission.  [facility] Dr. Lyn Hollingshead have agrees to admit pt -Escalation to admission/observation considered: patient is agreeable with admission         Final Clinical Impression(s) / ED Diagnoses Final diagnoses:  COPD exacerbation (HCC)  Acute on chronic respiratory failure with hypoxia (HCC)  Lung mass  AKI (acute kidney injury) [facility])    Rx / DC Orders ED Discharge Orders     None         Fayrene Helper, PA-C 06/28/23 1516    Fayrene Helper, PA-C 06/28/23 1517    Melene Plan, DO 06/28/23 1524

## 2023-06-28 NOTE — Progress Notes (Signed)
 Patient arrived to floor. Stand by assist from stretcher to bed. A&Ox4. Follows commands. Increase work of breathing while walking.On 2L via nasal cannula at 95%. Skin is intact. Patient vitals stable. Patient voiced no complaints at this time other than he is hungry. This RN gave phone for him to order food.

## 2023-06-29 ENCOUNTER — Observation Stay (HOSPITAL_COMMUNITY)

## 2023-06-29 DIAGNOSIS — Z1152 Encounter for screening for COVID-19: Secondary | ICD-10-CM | POA: Diagnosis not present

## 2023-06-29 DIAGNOSIS — R0609 Other forms of dyspnea: Secondary | ICD-10-CM | POA: Diagnosis not present

## 2023-06-29 DIAGNOSIS — F17210 Nicotine dependence, cigarettes, uncomplicated: Secondary | ICD-10-CM | POA: Diagnosis present

## 2023-06-29 DIAGNOSIS — J441 Chronic obstructive pulmonary disease with (acute) exacerbation: Secondary | ICD-10-CM | POA: Diagnosis present

## 2023-06-29 DIAGNOSIS — E1122 Type 2 diabetes mellitus with diabetic chronic kidney disease: Secondary | ICD-10-CM | POA: Diagnosis present

## 2023-06-29 DIAGNOSIS — T380X5A Adverse effect of glucocorticoids and synthetic analogues, initial encounter: Secondary | ICD-10-CM | POA: Diagnosis present

## 2023-06-29 DIAGNOSIS — I3139 Other pericardial effusion (noninflammatory): Secondary | ICD-10-CM | POA: Diagnosis present

## 2023-06-29 DIAGNOSIS — G4700 Insomnia, unspecified: Secondary | ICD-10-CM | POA: Diagnosis present

## 2023-06-29 DIAGNOSIS — E872 Acidosis, unspecified: Secondary | ICD-10-CM | POA: Diagnosis not present

## 2023-06-29 DIAGNOSIS — N1831 Chronic kidney disease, stage 3a: Secondary | ICD-10-CM | POA: Diagnosis present

## 2023-06-29 DIAGNOSIS — N179 Acute kidney failure, unspecified: Secondary | ICD-10-CM | POA: Diagnosis present

## 2023-06-29 DIAGNOSIS — D631 Anemia in chronic kidney disease: Secondary | ICD-10-CM | POA: Diagnosis present

## 2023-06-29 DIAGNOSIS — I10 Essential (primary) hypertension: Secondary | ICD-10-CM | POA: Diagnosis not present

## 2023-06-29 DIAGNOSIS — E1165 Type 2 diabetes mellitus with hyperglycemia: Secondary | ICD-10-CM | POA: Diagnosis not present

## 2023-06-29 DIAGNOSIS — M79661 Pain in right lower leg: Secondary | ICD-10-CM | POA: Diagnosis not present

## 2023-06-29 DIAGNOSIS — F1021 Alcohol dependence, in remission: Secondary | ICD-10-CM | POA: Diagnosis present

## 2023-06-29 DIAGNOSIS — N400 Enlarged prostate without lower urinary tract symptoms: Secondary | ICD-10-CM | POA: Diagnosis present

## 2023-06-29 DIAGNOSIS — E8809 Other disorders of plasma-protein metabolism, not elsewhere classified: Secondary | ICD-10-CM | POA: Diagnosis present

## 2023-06-29 DIAGNOSIS — J123 Human metapneumovirus pneumonia: Secondary | ICD-10-CM | POA: Diagnosis not present

## 2023-06-29 DIAGNOSIS — M109 Gout, unspecified: Secondary | ICD-10-CM | POA: Diagnosis present

## 2023-06-29 DIAGNOSIS — B9781 Human metapneumovirus as the cause of diseases classified elsewhere: Secondary | ICD-10-CM | POA: Diagnosis present

## 2023-06-29 DIAGNOSIS — Z794 Long term (current) use of insulin: Secondary | ICD-10-CM | POA: Diagnosis not present

## 2023-06-29 DIAGNOSIS — Z923 Personal history of irradiation: Secondary | ICD-10-CM | POA: Diagnosis not present

## 2023-06-29 DIAGNOSIS — J9601 Acute respiratory failure with hypoxia: Secondary | ICD-10-CM | POA: Diagnosis not present

## 2023-06-29 DIAGNOSIS — J9621 Acute and chronic respiratory failure with hypoxia: Secondary | ICD-10-CM | POA: Diagnosis present

## 2023-06-29 DIAGNOSIS — J439 Emphysema, unspecified: Secondary | ICD-10-CM | POA: Diagnosis present

## 2023-06-29 DIAGNOSIS — Z888 Allergy status to other drugs, medicaments and biological substances status: Secondary | ICD-10-CM | POA: Diagnosis not present

## 2023-06-29 DIAGNOSIS — Z79899 Other long term (current) drug therapy: Secondary | ICD-10-CM | POA: Diagnosis not present

## 2023-06-29 DIAGNOSIS — R918 Other nonspecific abnormal finding of lung field: Secondary | ICD-10-CM | POA: Diagnosis present

## 2023-06-29 DIAGNOSIS — I129 Hypertensive chronic kidney disease with stage 1 through stage 4 chronic kidney disease, or unspecified chronic kidney disease: Secondary | ICD-10-CM | POA: Diagnosis present

## 2023-06-29 DIAGNOSIS — Z720 Tobacco use: Secondary | ICD-10-CM | POA: Diagnosis not present

## 2023-06-29 DIAGNOSIS — E782 Mixed hyperlipidemia: Secondary | ICD-10-CM | POA: Diagnosis present

## 2023-06-29 LAB — CBC WITH DIFFERENTIAL/PLATELET
Abs Immature Granulocytes: 0 10*3/uL (ref 0.00–0.07)
Basophils Absolute: 0 10*3/uL (ref 0.0–0.1)
Basophils Relative: 0 %
Eosinophils Absolute: 0 10*3/uL (ref 0.0–0.5)
Eosinophils Relative: 0 %
HCT: 31 % — ABNORMAL LOW (ref 39.0–52.0)
Hemoglobin: 10.1 g/dL — ABNORMAL LOW (ref 13.0–17.0)
Lymphocytes Relative: 4 %
Lymphs Abs: 0.3 10*3/uL — ABNORMAL LOW (ref 0.7–4.0)
MCH: 31.5 pg (ref 26.0–34.0)
MCHC: 32.6 g/dL (ref 30.0–36.0)
MCV: 96.6 fL (ref 80.0–100.0)
Monocytes Absolute: 0.1 10*3/uL (ref 0.1–1.0)
Monocytes Relative: 1 %
Neutro Abs: 7.3 10*3/uL (ref 1.7–7.7)
Neutrophils Relative %: 95 %
Platelets: 245 10*3/uL (ref 150–400)
RBC: 3.21 MIL/uL — ABNORMAL LOW (ref 4.22–5.81)
RDW: 15.5 % (ref 11.5–15.5)
WBC: 7.7 10*3/uL (ref 4.0–10.5)
nRBC: 0 % (ref 0.0–0.2)
nRBC: 0 /100{WBCs}

## 2023-06-29 LAB — RESPIRATORY PANEL BY PCR

## 2023-06-29 LAB — COMPREHENSIVE METABOLIC PANEL
ALT: 30 U/L (ref 0–44)
AST: 22 U/L (ref 15–41)
Albumin: 2.1 g/dL — ABNORMAL LOW (ref 3.5–5.0)
Alkaline Phosphatase: 113 U/L (ref 38–126)
Anion gap: 10 (ref 5–15)
BUN: 46 mg/dL — ABNORMAL HIGH (ref 8–23)
CO2: 17 mmol/L — ABNORMAL LOW (ref 22–32)
Calcium: 8.4 mg/dL — ABNORMAL LOW (ref 8.9–10.3)
Chloride: 108 mmol/L (ref 98–111)
Creatinine, Ser: 2.57 mg/dL — ABNORMAL HIGH (ref 0.61–1.24)
GFR, Estimated: 24 mL/min — ABNORMAL LOW (ref >60)
Glucose, Bld: 541 mg/dL (ref 70–99)
Potassium: 4.2 mmol/L (ref 3.5–5.1)
Sodium: 135 mmol/L (ref 135–145)
Total Bilirubin: 0.2 mg/dL (ref 0.0–1.2)
Total Protein: 5.6 g/dL — ABNORMAL LOW (ref 6.5–8.1)

## 2023-06-29 LAB — GLUCOSE, CAPILLARY
Glucose-Capillary: 400 mg/dL — ABNORMAL HIGH (ref 70–99)
Glucose-Capillary: 459 mg/dL — ABNORMAL HIGH (ref 70–99)
Glucose-Capillary: 422 mg/dL — ABNORMAL HIGH (ref 70–99)
Glucose-Capillary: 460 mg/dL — ABNORMAL HIGH (ref 70–99)
Glucose-Capillary: 356 mg/dL — ABNORMAL HIGH (ref 70–99)
Glucose-Capillary: 289 mg/dL — ABNORMAL HIGH (ref 70–99)

## 2023-06-29 LAB — PHOSPHORUS: Phosphorus: 4.6 mg/dL (ref 2.5–4.6)

## 2023-06-29 LAB — MAGNESIUM: Magnesium: 2.2 mg/dL (ref 1.7–2.4)

## 2023-06-29 LAB — HEMOGLOBIN A1C
Hgb A1c MFr Bld: 9.9 % — ABNORMAL HIGH (ref 4.8–5.6)
Mean Plasma Glucose: 237.43 mg/dL

## 2023-06-29 MED ORDER — ARFORMOTEROL TARTRATE 15 MCG/2ML IN NEBU
15.0000 ug | INHALATION_SOLUTION | Freq: Two times a day (BID) | RESPIRATORY_TRACT | Status: DC
  Administered 2023-06-29 – 2023-07-01 (×4): 15 ug via RESPIRATORY_TRACT
  Filled 2023-06-29 (×4): qty 2

## 2023-06-29 MED ORDER — INSULIN ASPART 100 UNIT/ML IJ SOLN: 0.0000 [IU] | Freq: Every day | INTRAMUSCULAR | Status: DC

## 2023-06-29 MED ORDER — INSULIN GLARGINE-YFGN 100 UNIT/ML ~~LOC~~ SOPN: 10.0000 [IU] | PEN_INJECTOR | SUBCUTANEOUS | Status: DC

## 2023-06-29 MED ORDER — LEVALBUTEROL HCL 0.63 MG/3ML IN NEBU
0.6300 mg | INHALATION_SOLUTION | Freq: Four times a day (QID) | RESPIRATORY_TRACT | Status: DC
  Administered 2023-06-29 – 2023-07-07 (×32): 0.63 mg via RESPIRATORY_TRACT
  Filled 2023-06-29 (×32): qty 3

## 2023-06-29 MED ORDER — INSULIN ASPART 100 UNIT/ML IJ SOLN
9.0000 [IU] | Freq: Once | INTRAMUSCULAR | Status: AC
  Administered 2023-06-29: 9 [IU] via SUBCUTANEOUS

## 2023-06-29 MED ORDER — INSULIN ASPART 100 UNIT/ML IJ SOLN
7.0000 [IU] | Freq: Once | INTRAMUSCULAR | Status: AC
  Administered 2023-06-29: 7 [IU] via SUBCUTANEOUS

## 2023-06-29 MED ORDER — INSULIN ASPART 100 UNIT/ML IJ SOLN
10.0000 [IU] | Freq: Once | INTRAMUSCULAR | Status: AC
  Administered 2023-06-29: 10 [IU] via SUBCUTANEOUS

## 2023-06-29 MED ORDER — INSULIN ASPART 100 UNIT/ML IJ SOLN: 4.0000 [IU] | Freq: Three times a day (TID) | INTRAMUSCULAR | Status: DC

## 2023-06-29 MED ORDER — INSULIN ASPART 100 UNIT/ML IJ SOLN
0.0000 [IU] | Freq: Three times a day (TID) | INTRAMUSCULAR | Status: DC
  Administered 2023-06-29: 20 [IU] via SUBCUTANEOUS

## 2023-06-29 MED ORDER — GUAIFENESIN ER 600 MG PO TB12
1200.0000 mg | ORAL_TABLET | Freq: Two times a day (BID) | ORAL | Status: DC
  Administered 2023-06-29 – 2023-07-07 (×16): 1200 mg via ORAL
  Filled 2023-06-29 (×16): qty 2

## 2023-06-29 MED ORDER — BUDESONIDE 0.25 MG/2ML IN SUSP
0.2500 mg | Freq: Two times a day (BID) | RESPIRATORY_TRACT | Status: DC
  Administered 2023-06-29 – 2023-07-01 (×4): 0.25 mg via RESPIRATORY_TRACT
  Filled 2023-06-29 (×4): qty 2

## 2023-06-29 MED ORDER — INSULIN ASPART 100 UNIT/ML IJ SOLN: 0.0000 [IU] | Freq: Three times a day (TID) | INTRAMUSCULAR | Status: DC

## 2023-06-29 MED ORDER — IPRATROPIUM BROMIDE 0.02 % IN SOLN
0.5000 mg | Freq: Four times a day (QID) | RESPIRATORY_TRACT | Status: DC
  Administered 2023-06-29 – 2023-06-30 (×4): 0.5 mg via RESPIRATORY_TRACT
  Filled 2023-06-29 (×3): qty 2.5

## 2023-06-29 MED ORDER — INSULIN ASPART 100 UNIT/ML IJ SOLN
0.0000 [IU] | INTRAMUSCULAR | Status: DC
  Administered 2023-06-29: 8 [IU] via SUBCUTANEOUS
  Administered 2023-06-29: 15 [IU] via SUBCUTANEOUS
  Administered 2023-06-30: 11 [IU] via SUBCUTANEOUS
  Administered 2023-06-30: 8 [IU] via SUBCUTANEOUS
  Administered 2023-06-30: 11 [IU] via SUBCUTANEOUS
  Administered 2023-06-30: 8 [IU] via SUBCUTANEOUS
  Administered 2023-06-30: 15 [IU] via SUBCUTANEOUS
  Administered 2023-06-30: 5 [IU] via SUBCUTANEOUS
  Administered 2023-07-01 (×2): 8 [IU] via SUBCUTANEOUS
  Administered 2023-07-01: 3 [IU] via SUBCUTANEOUS

## 2023-06-29 MED ORDER — INSULIN GLARGINE 100 UNIT/ML ~~LOC~~ SOLN
10.0000 [IU] | Freq: Every day | SUBCUTANEOUS | Status: DC
  Administered 2023-06-29 – 2023-06-30 (×2): 10 [IU] via SUBCUTANEOUS
  Filled 2023-06-29 (×3): qty 0.1

## 2023-06-29 MED ORDER — METHYLPREDNISOLONE SODIUM SUCC 125 MG IJ SOLR
60.0000 mg | Freq: Two times a day (BID) | INTRAMUSCULAR | Status: AC
  Administered 2023-06-29 – 2023-06-30 (×3): 60 mg via INTRAVENOUS
  Filled 2023-06-29 (×3): qty 2

## 2023-06-29 MED ORDER — IPRATROPIUM-ALBUTEROL 0.5-2.5 (3) MG/3ML IN SOLN
3.0000 mL | Freq: Four times a day (QID) | RESPIRATORY_TRACT | Status: DC
  Administered 2023-06-29: 3 mL via RESPIRATORY_TRACT
  Filled 2023-06-29: qty 3

## 2023-06-29 MED ORDER — INSULIN ASPART 100 UNIT/ML IJ SOLN
6.0000 [IU] | Freq: Three times a day (TID) | INTRAMUSCULAR | Status: DC
  Administered 2023-06-29 – 2023-07-05 (×12): 6 [IU] via SUBCUTANEOUS

## 2023-06-29 NOTE — Hospital Course (Addendum)
 Jeffrey Randall. is a 83 y.o. male with medical history significant of hypertension, diabetes, COPD, lung cancer s/p radiation, alcoholism in remission, CKD 3 A, gout, hyperlipidemia, BPH presenting with worsening shortness of breath. Further workup reveals + Metapneumovirus. Ruling out PE but VQ Scan unable to be done as patient could not lay flat. Nebulized Medications are being further adjusted.    Continued to be dyspneic so pulmonary was consulted and they are adding [place] and was transferrred to the  Progressive care unit for possible BiPAP if necessary.  IV morphine and lorazepam were added.  Now on heated high flow nasal cannula and appears more comfortable.  Continues to be dyspneic so medication changes are being made and now is on antibiotic coverage and echocardiogram is being obtained.  Assessment and Plan:  Acute Respiratory failure with hypoxia in the setting of Metapneumovirus with Acute COPD exacerbation Rule out pulmonary embolism -Respiratory panel is + for Metapneumovirus.  No leukocytosis to indicate bacterial etiology.   -Initial CXR did show irregularity/solid density at the right lung apex recommending possible CT to follow-up.  However, patient had consolidation at this location noted on CT in August with read Care Everywhere.  PET scan was done in follow-up considering patient's history of lung cancer which showed consolidation superimposed on the right upper lobe radiation fibrosis favoring infection versus inflammation but no evidence of recurrence of lung cancer or metastatic disease. -ABG done and showed a pH of 7.28, pCO2 37, pO2 133, HCO3 of 17.4, and a O2 saturation of 100% -Continue with systemic steroids and now IV Solu-Medrol been increased back to 40 mg twice daily by Pulmonary -Scheduled Xopenex/Atrovent with PRN  As needed albuterol and now they are adding revefenacin 175 mcg nebs daily -C/w Afromoterol 15 mcg Neb BID and Budesonide 0.25 mg Daily -VQ scan  when able to be done when he is able to lie flat in the interim we will check Echocardiogram and lower extremity venous duplex.  -CXR this AM showed cardiomegaly and increased creasing interstitial edema which was compatible with CHF and as well as asymmetric left-sided airspace disease that may represent asymmetric edema or infection.  Given these findings will give him dose of IV Lasix 40 mg x 1 but he does not appear to overtly volume overloaded -Will start the patient on empiric antibiotics with IV ceftriaxone and azithromycin to treat for superimposed bacterial infection for 5 Days -Appreciate further Pulmonary Evaluation and recommendations and he had been transition to heated high flow nasal cannula 40 L oxygen with 30% FiO2 yesterday but this morning he was on 3 L and stopping so he was placed back on the 40 L.  PT/OT recommending home health at discharge.  Chest pain and discomfort: Happened when he started having worsening breathing.  BNP was 190.9 check troponins x 2 and went from 22 and is now 24.  EKG done showed a normal sinus rhythm with a rate of 70 and a QTc of 442.  Echocardiogram done and showed LVEF of >75% w/ no RWMA, and Normal LVDP. RVSF was normal. [place] has resolved.   AKI on CKD 3A / Metabolic Acidosis:  Creatinine elevated to 2.7 from previous baseline of 1.4 (though this was 2 years ago) Did reportedly have some recent decreased p.o. intake. Received 1 L in the ED. BUN/Cr Trend: Recent Labs  Lab 06/28/23 1335 06/29/23 1019 06/30/23 0814 07/01/23 0332 07/02/23 0259  BUN 44* 46* 57* 67* 74*  CREATININE 2.70* 2.57* 2.63* 2.75* 2.77*  -  IVF now discontinued; this appears to be likely his new baseline; Give IV Lasix 40 mg x1 as above -Since the patient continues have a slight metabolic acidosis he has a CO2 17, chloride level of 115, anion gap of 6.  Start Sodium Bicarbonate 650 mg po BID -Avoid Nephrotoxic Medications, Contrast Dyes, Hypotension and Dehydration to Ensure  Adequate Renal Perfusion and will need to Renally Adjust Meds. CTM and Trend Renal Function carefully and repeat CMP in the AM    Leukocytosis: Setting of steroid demargination.  Patient's WBC is now trending back down and gone from 15.9 and is 13.2.  Continue to monitor for signs or symptoms of infection.  Repeat CBC showed mild improvement today at 15.0.  Continue to monitor for signs and symptoms of infection  History of Lung Cancer: Status post radiation treatment in 2022.  As above had CT with abnormality last year and follow-up PET scan showed no recurrence of disease. Will need outpt f/u with his cancer doctors.  Pulmonary consulted as above   Essential HTN: C/w Amlodipine 10 mg po Daily.  Hold Valsartan/Hydrochlorothiazide in the setting of AKI. CTM BP per Protocol. Last BP reading was 147/79   Diabetes Mellitus Type 2: Home medication is 70/30 insulin SSI TID. C/w Moderate Novolog SSI AC. CTM for Hyperglycemia with steroids for COPD. Adjust Insulin regimen as necessary. CBGs significantly elevated. Add Lantus 10 units Daily and increased to BID. Start Moderate Novolog SSI q4h and Novolog Meal Coverage 6 units TID if eats 50% or Greater.  CBGs ranging from 168-362   Gout: Holding Allopurinol 100 mg po Daily in the setting of AKI   BPH: C/w Finasteride 5 mg po Daily and Tamsulosin 0.8 mg po qHS  Normocytic Anemia: Hgb/Hct stable around 10 and is now 10.0/29.8.  Anemia panel showed an iron level of 35, UIBC 189, TIBC 224, saturation ratios of 16%, ferritin of 147, folate level 8.6 and vitamin B12 1449 CTM for S/Sx of Bleeding; No overt bleeding noted. Repeat CBC in the AM   Hyperlipidemia: C/w Simvastatin 10 mg po Daily   Hypoalbuminemia: Albumin Level stable at 2.1. CTM and Trend and repeat CMP in the AM

## 2023-06-29 NOTE — Progress Notes (Signed)
 PROGRESS NOTE    Jeffrey Randall.  WUJ:[id number] DOB: 1940-10-27 DOA: 06/28/2023 PCP: Noni Saupe, MD   Brief Narrative:  Jeffrey Costilla. is a 83 y.o. male with medical history significant of hypertension, diabetes, COPD, lung cancer s/p radiation, alcoholism in remission, CKD 3 A, gout, hyperlipidemia, BPH presenting with worsening shortness of breath. Further workup reveals + Metapneumovirus. Ruling out PE but VQ Scan unable to be done as patient could not lay flat. Nebulized Medications are being further adjusted.   Assessment and Plan:  Acute Respiratory failure with hypoxia in the setting of Metapneumovirus with Acute COPD exacerbation Rule out pulmonary embolism -Patient with some chronic shortness of breath last 3 to 4 months worse for the last 1 day. -Likely viral.  Unable to get CTA PE study in the ED due to AKI, VQ scan has been ordered but unable to tolerate -Respiratory panel is + for Metapneumovirus.  No leukocytosis to indicate bacterial etiology.   -CXR did show irregularity/solid density at the right lung apex recommending possible CT to follow-up.  However, patient had consolidation at this location noted on CT in August with read Care Everywhere.  PET scan was done in follow-up considering patient's history of lung cancer which showed consolidation superimposed on the right upper lobe radiation fibrosis favoring infection versus inflammation but no evidence of recurrence of lung cancer or metastatic disease. -Requiring 2 L to maintain saturations.  Received DuoNeb in the ED.  Does have baseline COPD and has some wheezing. - Monitor on telemetry overnight - Add on steroids and resume IV Solumedrol - Scheduled Xopenex/Atrovent with PRN  As needed albuterol -Replaced hme Trelegy with formulary Breo and Incruse but will add Afromoterol 15 mcg Neb BID and Budesonide 0.25 mg Daily - Follow-up VQ scan when able to be done. Check CXR in the AM   AKI on CKD 3A /  Metabolic Acidosis:  Creatinine elevated to 2.7 from previous baseline of 1.4 (though this was 2 years ago) Did reportedly have some recent decreased p.o. intake. Received 1 L in the ED. BUN/Cr Trend: Recent Labs  Lab 06/28/23 1335 06/29/23 1019  BUN 44* 46*  CREATININE 2.70* 2.57*  -IVF now discontinued -Avoid Nephrotoxic Medications, Contrast Dyes, Hypotension and Dehydration to Ensure Adequate Renal Perfusion and will need to Renally Adjust Meds. CTM and Trend Renal Function carefully and repeat CMP in the AM    History of Lung Cancer: Status post radiation treatment in 2022.  As above had CT with abnormality last year and follow-up PET scan showed no recurrence of disease. Will need outpt f/u   Essential HTN: C/w Amlodipine 10 mg po Daily.  Hold Valsartan/Hydrochlorothiazide in the setting of AKI. CTM BP per Protocol. Last BP reading was 145/60   Diabetes Mellitus Type 2: Home medication is 70/30 insulin SSI TID. C/w Moderate Novolog SSI AC. CTM for Hyperglycemia with steroids for COPD. Adjust Insulin regimen as necessary. CBGs significantly elevated. Add Lantus 10 units Daily and Start Moderate Novolog SSI q4h and Novolog Meal Coverage 6 units TID if eats 50% or Greater   Gout: Holding Allopurinol 100 mg po Daily in the setting of AKI   BPH: C/w Finasteride 5 mg po Daily and Tamsulosin 0.8 mg po qHS  Normocytic Anemia: Hgb/Hct 11.0/33.6. Check Anemia Panel in the AM. CTM for S/Sx of Bleeding; No overt bleeding noted. Repeat CBC in the AM   Hyperlipidemia: C/w Simvastatin 10 mg po Daily    DVT prophylaxis: heparin  injection 5,000 Units Start: 06/28/23 1530    Code Status: Full Code Family Communication: Needs further clinical improvement in Respiratory Status  Disposition Plan:  Level of care: Telemetry Medical Status is: Observation The patient will require care spanning > 2 midnights and should be moved to inpatient because: Still is very dyspneic    Consultants:   None  Procedures:  As delineated as above  Antimicrobials:  Anti-infectives (From admission, onward)    None       Subjective: Seen and examined at bedside and was still short of breath, no nausea or vomiting.  Feels okay.  No other concerns or complaints at this time.  Objective: Vitals:   06/29/23 0746 06/29/23 1143 06/29/23 1500 06/29/23 1540  BP: (!) 145/60 (!) 145/61  (!) 158/56  Pulse: 71 72  77  Resp: 16 16  16   Temp: 97.8 F (36.6 C) 98.2 F (36.8 C)  98.5 F (36.9 C)  TempSrc: Oral Oral  Oral  SpO2: 99% 98% 98% 96%  Weight:      Height:        Intake/Output Summary (Last 24 hours) at 06/29/2023 1915 Last data filed at 06/29/2023 1308 Gross per 24 hour  Intake 1056.25 ml  Output --  Net 1056.25 ml   Filed Weights   06/28/23 1219  Weight: 64.3 kg   Examination: Physical Exam:  Constitutional: Elderly chronically ill-appearing African-American male in no acute distress Respiratory: Diminished to auscultation bilaterally with some coarse breath sounds, no wheezing, rales, rhonchi or crackles. Normal respiratory effort and patient is not tachypenic. No accessory muscle use.  Wearing supplemental oxygen via nasal cannula Cardiovascular: RRR, no murmurs / rubs / gallops. S1 and S2 auscultated.  No appreciable lower extremity edema Abdomen: Soft, non-tender, non-distended. Bowel sounds positive.  GU: Deferred. Musculoskeletal: No clubbing / cyanosis of digits/nails. No joint deformity upper and lower extremities.  Skin: No rashes, lesions, ulcers limited skin evaluation. No induration; Warm and dry.  Neurologic: CN 2-12 grossly intact with no focal deficits. Romberg sign and cerebellar reflexes not assessed.  Psychiatric: Normal judgment and insight. Alert and oriented x 3. Normal mood and appropriate affect.   Data Reviewed: I have personally reviewed following labs and imaging studies  CBC: Recent Labs  Lab 06/28/23 1335 06/29/23 1019  WBC 9.7 7.7   NEUTROABS 8.6* 7.3  HGB 11.0* 10.1*  HCT 33.6* 31.0*  MCV 97.7 96.6  PLT 241 245   Basic Metabolic Panel: Recent Labs  Lab 06/28/23 1335 06/29/23 1019  NA 139 135  K 4.4 4.2  CL 111 108  CO2 18* 17*  GLUCOSE 283* 541*  BUN 44* 46*  CREATININE 2.70* 2.57*  CALCIUM 8.8* 8.4*  MG  --  2.2  PHOS  --  4.6   GFR: Estimated Creatinine Clearance: 19.8 mL/min (A) (by C-G formula based on SCr of 2.57 mg/dL (H)). Liver Function Tests: Recent Labs  Lab 06/29/23 1019  AST 22  ALT 30  ALKPHOS 113  BILITOT 0.2  PROT 5.6*  ALBUMIN 2.1*   No results for input(s): "LIPASE", "AMYLASE" in the last 168 hours. No results for input(s): "AMMONIA" in the last 168 hours. Coagulation Profile: No results for input(s): "INR", "PROTIME" in the last 168 hours. Cardiac Enzymes: No results for input(s): "CKTOTAL", "CKMB", "CKMBINDEX", "TROPONINI" in the last 168 hours. BNP (last 3 results) No results for input(s): "PROBNP" in the last 8760 hours. HbA1C: Recent Labs    06/29/23 1019  HGBA1C 9.9*   CBG:  Recent Labs  Lab 06/29/23 0754 06/29/23 1142 06/29/23 1247 06/29/23 1322 06/29/23 1540  GLUCAP 400* 459* 460* 422* 356*   Lipid Profile: No results for input(s): "CHOL", "HDL", "LDLCALC", "TRIG", "CHOLHDL", "LDLDIRECT" in the last 72 hours. Thyroid Function Tests: No results for input(s): "TSH", "T4TOTAL", "FREET4", "T3FREE", "THYROIDAB" in the last 72 hours. Anemia Panel: No results for input(s): "VITAMINB12", "FOLATE", "FERRITIN", "TIBC", "IRON", "RETICCTPCT" in the last 72 hours. Sepsis Labs: No results for input(s): "PROCALCITON", "LATICACIDVEN" in the last 168 hours.  Recent Results (from the past 240 hours)  Resp panel by RT-PCR (RSV, Flu A&B, Covid) Anterior Nasal Swab     Status: None   Collection Time: 06/28/23 12:27 PM   Specimen: Anterior Nasal Swab  Result Value Ref Range Status   SARS Coronavirus 2 by RT PCR NEGATIVE NEGATIVE Final   Influenza A by PCR NEGATIVE  NEGATIVE Final   Influenza B by PCR NEGATIVE NEGATIVE Final    Comment: (NOTE) The Xpert Xpress SARS-CoV-2/FLU/RSV plus assay is intended as an aid in the diagnosis of influenza from Nasopharyngeal swab specimens and should not be used as a sole basis for treatment. Nasal washings and aspirates are unacceptable for Xpert Xpress SARS-CoV-2/FLU/RSV testing.  Fact Sheet for Patients: BloggerCourse.com  Fact Sheet for Healthcare Providers: SeriousBroker.it  This test is not yet approved or cleared by the [place] FDA and has been authorized for detection and/or diagnosis of SARS-CoV-2 by FDA under an Emergency Use Authorization (EUA). This EUA will remain in effect (meaning this test can be used) for the duration of the COVID-19 declaration under Section 564(b)(1) of the Act, 21 U.S.C. section 360bbb-3(b)(1), unless the authorization is terminated or revoked.     Resp Syncytial Virus by PCR NEGATIVE NEGATIVE Final    Comment: (NOTE) Fact Sheet for Patients: BloggerCourse.com  Fact Sheet for Healthcare Providers: SeriousBroker.it  This test is not yet approved or cleared by the [place] FDA and has been authorized for detection and/or diagnosis of SARS-CoV-2 by FDA under an Emergency Use Authorization (EUA). This EUA will remain in effect (meaning this test can be used) for the duration of the COVID-19 declaration under Section 564(b)(1) of the Act, 21 U.S.C. section 360bbb-3(b)(1), unless the authorization is terminated or revoked.  Performed at [facility], [address] [place], [place] [address]   Respiratory (~20 pathogens) panel by PCR     Status: Abnormal   Collection Time: 06/28/23  3:52 PM   Specimen: Nasopharyngeal Swab; Respiratory  Result Value Ref Range Status   Adenovirus NOT DETECTED NOT DETECTED Final   Coronavirus 229E NOT DETECTED NOT  DETECTED Final    Comment: (NOTE) The Coronavirus on the Respiratory Panel, DOES NOT test for the novel  Coronavirus (2019 nCoV)    Coronavirus HKU1 NOT DETECTED NOT DETECTED Final   Coronavirus NL63 NOT DETECTED NOT DETECTED Final   Coronavirus OC43 NOT DETECTED NOT DETECTED Final   Metapneumovirus DETECTED (A) NOT DETECTED Final   Rhinovirus / Enterovirus NOT DETECTED NOT DETECTED Final   Influenza A NOT DETECTED NOT DETECTED Final   Influenza B NOT DETECTED NOT DETECTED Final   Parainfluenza Virus 1 NOT DETECTED NOT DETECTED Final   Parainfluenza Virus 2 NOT DETECTED NOT DETECTED Final   Parainfluenza Virus 3 NOT DETECTED NOT DETECTED Final   Parainfluenza Virus 4 NOT DETECTED NOT DETECTED Final   Respiratory Syncytial Virus NOT DETECTED NOT DETECTED Final   Bordetella pertussis NOT DETECTED NOT DETECTED Final   Bordetella  Parapertussis NOT DETECTED NOT DETECTED Final   Chlamydophila pneumoniae NOT DETECTED NOT DETECTED Final   Mycoplasma pneumoniae NOT DETECTED NOT DETECTED Final    Comment: Performed at [facility], [address] [place], [place] [address]    Radiology Studies: DG CHEST PORT 1 VIEW Result Date: 06/29/2023 CLINICAL DATA:  Shortness of breath. EXAM: PORTABLE CHEST 1 VIEW COMPARISON:  Chest radiograph dated 06/28/2023. FINDINGS: Background of emphysema. Small left pleural effusion with blunting of the left costophrenic angle. Area of nodular scarring in the right upper lobe as seen on the prior radiograph and CT. No new consolidation. No pneumothorax. Stable cardiac silhouette. Atherosclerotic calcification of the aorta. No acute osseous pathology. IMPRESSION: Small left pleural effusion. No new consolidation. Emphysema and stable right upper lobe architectural distortion. Electronically Signed   By: Elgie Collard M.D.   On: 06/29/2023 12:15   DG Chest Port 1 View Result Date: 06/28/2023 CLINICAL DATA:  Shortness of breath.  Productive cough. EXAM: PORTABLE  CHEST 1 VIEW COMPARISON:  June 08, 2021. FINDINGS: The heart size and mediastinal contours are within normal limits. Left midlung and basilar opacity is noted concerning for possible atelectasis or scarring which was present on prior exam. However, probable irregular solid density is seen in right lung apex. The visualized skeletal structures are unremarkable. IMPRESSION: Probable irregular solid density seen in right lung apex. CT scan of the chest is recommended to evaluate for possible neoplasm or malignancy. Electronically Signed   By: Lupita Raider M.D.   On: 06/28/2023 14:54   Scheduled Meds:  amLODipine  10 mg Oral Daily   arformoterol  15 mcg Nebulization BID   budesonide (PULMICORT) nebulizer solution  0.25 mg Nebulization BID   finasteride  5 mg Oral Daily   guaiFENesin  1,200 mg Oral BID   heparin  5,000 Units Subcutaneous Q8H   insulin aspart  0-15 Units Subcutaneous Q4H   insulin aspart  6 Units Subcutaneous TID WC   insulin glargine  10 Units Subcutaneous Daily   ipratropium  0.5 mg Nebulization Q6H   levalbuterol  0.63 mg Nebulization Q6H   methylPREDNISolone (SOLU-MEDROL) injection  60 mg Intravenous Q12H   nicotine  21 mg Transdermal Daily   simvastatin  10 mg Oral QHS   sodium chloride flush  3 mL Intravenous Q12H   tamsulosin  0.8 mg Oral QHS   Continuous Infusions:   LOS: 0 days   Marguerita Merles, DO [facility] Available via Epic secure chat 7am-7pm After these hours, please refer to coverage provider listed on [email or web address] 06/29/2023, 7:15 PM

## 2023-06-29 NOTE — Plan of Care (Signed)

## 2023-06-29 NOTE — Inpatient Diabetes Management (Signed)
 Inpatient Diabetes Program Recommendations  AACE/ADA: New Consensus Statement on Inpatient Glycemic Control (2015)  Target Ranges:  Prepandial:   less than 140 mg/dL      Peak postprandial:   less than 180 mg/dL (1-2 hours)      Critically ill patients:  140 - 180 mg/dL   Lab Results  Component Value Date   GLUCAP 460 (H) 06/29/2023   HGBA1C 9.9 (H) 06/29/2023    Latest Reference Range & Units 06/28/23 16:26 06/28/23 19:47 06/29/23 07:54 06/29/23 11:42 06/29/23 12:47  Glucose-Capillary 70 - 99 mg/dL 638 (H) Novolog 8 units Solumedrol 125 mg 265 (H) 400 (H) Novolog 22 units 459 (H) Novolog 30 units 460 (H)  (H): Data is abnormally high  Diabetes history: DM2 Outpatient Diabetes medications: 70/30 insulin 2-14 units tid, Farxiga 10 mg daily, Libre 3 CGM Current orders for Inpatient glycemic control:  Solumedrol 125 mg x 1 on 06/28/23 @ 12:53 pm Lantus 10 units daily  Novolog 4 units tid,   Inpatient Diabetes Program Recommendations:   Please consider while on steroids: -Change Novolog correction to 0-15 units q 4 hrs. -Increase Novolog meal coverage to 6 units tid if eats 50%  Thank you, Darel Hong E. Aarib Pulido, RN, MSN, CDCES  Diabetes Coordinator Inpatient Glycemic Control Team Team Pager [phone] (8am-5pm) 06/29/2023 1:20 PM

## 2023-06-30 ENCOUNTER — Telehealth: Payer: Self-pay | Admitting: Pulmonary Disease

## 2023-06-30 ENCOUNTER — Inpatient Hospital Stay (HOSPITAL_COMMUNITY)

## 2023-06-30 DIAGNOSIS — Z720 Tobacco use: Secondary | ICD-10-CM

## 2023-06-30 DIAGNOSIS — J123 Human metapneumovirus pneumonia: Secondary | ICD-10-CM

## 2023-06-30 DIAGNOSIS — J441 Chronic obstructive pulmonary disease with (acute) exacerbation: Secondary | ICD-10-CM

## 2023-06-30 DIAGNOSIS — N179 Acute kidney failure, unspecified: Secondary | ICD-10-CM | POA: Diagnosis not present

## 2023-06-30 DIAGNOSIS — J9601 Acute respiratory failure with hypoxia: Secondary | ICD-10-CM | POA: Diagnosis not present

## 2023-06-30 DIAGNOSIS — I10 Essential (primary) hypertension: Secondary | ICD-10-CM | POA: Diagnosis not present

## 2023-06-30 DIAGNOSIS — N400 Enlarged prostate without lower urinary tract symptoms: Secondary | ICD-10-CM | POA: Diagnosis not present

## 2023-06-30 LAB — GLUCOSE, CAPILLARY
Glucose-Capillary: 236 mg/dL — ABNORMAL HIGH (ref 70–99)
Glucose-Capillary: 262 mg/dL — ABNORMAL HIGH (ref 70–99)
Glucose-Capillary: 295 mg/dL — ABNORMAL HIGH (ref 70–99)
Glucose-Capillary: 297 mg/dL — ABNORMAL HIGH (ref 70–99)
Glucose-Capillary: 311 mg/dL — ABNORMAL HIGH (ref 70–99)
Glucose-Capillary: 319 mg/dL — ABNORMAL HIGH (ref 70–99)
Glucose-Capillary: 385 mg/dL — ABNORMAL HIGH (ref 70–99)

## 2023-06-30 LAB — CBC WITH DIFFERENTIAL/PLATELET
Abs Immature Granulocytes: 0.07 10*3/uL (ref 0.00–0.07)
Basophils Absolute: 0 10*3/uL (ref 0.0–0.1)
Basophils Relative: 0 %
Eosinophils Absolute: 0 10*3/uL (ref 0.0–0.5)
Eosinophils Relative: 0 %
HCT: 30.1 % — ABNORMAL LOW (ref 39.0–52.0)
Hemoglobin: 10.1 g/dL — ABNORMAL LOW (ref 13.0–17.0)
Immature Granulocytes: 0 %
Lymphocytes Relative: 2 %
Lymphs Abs: 0.4 10*3/uL — ABNORMAL LOW (ref 0.7–4.0)
MCH: 32.2 pg (ref 26.0–34.0)
MCHC: 33.6 g/dL (ref 30.0–36.0)
MCV: 95.9 fL (ref 80.0–100.0)
Monocytes Absolute: 0.4 10*3/uL (ref 0.1–1.0)
Monocytes Relative: 2 %
Neutro Abs: 15.1 10*3/uL — ABNORMAL HIGH (ref 1.7–7.7)
Neutrophils Relative %: 96 %
Platelets: 260 10*3/uL (ref 150–400)
RBC: 3.14 MIL/uL — ABNORMAL LOW (ref 4.22–5.81)
RDW: 15.3 % (ref 11.5–15.5)
WBC: 15.9 10*3/uL — ABNORMAL HIGH (ref 4.0–10.5)
nRBC: 0 % (ref 0.0–0.2)

## 2023-06-30 LAB — COMPREHENSIVE METABOLIC PANEL
ALT: 26 U/L (ref 0–44)
AST: 17 U/L (ref 15–41)
Albumin: 2.1 g/dL — ABNORMAL LOW (ref 3.5–5.0)
Alkaline Phosphatase: 123 U/L (ref 38–126)
Anion gap: 8 (ref 5–15)
BUN: 57 mg/dL — ABNORMAL HIGH (ref 8–23)
CO2: 17 mmol/L — ABNORMAL LOW (ref 22–32)
Calcium: 8.7 mg/dL — ABNORMAL LOW (ref 8.9–10.3)
Chloride: 110 mmol/L (ref 98–111)
Creatinine, Ser: 2.63 mg/dL — ABNORMAL HIGH (ref 0.61–1.24)
GFR, Estimated: 23 mL/min — ABNORMAL LOW (ref >60)
Glucose, Bld: 318 mg/dL — ABNORMAL HIGH (ref 70–99)
Potassium: 4 mmol/L (ref 3.5–5.1)
Sodium: 135 mmol/L (ref 135–145)
Total Bilirubin: 0.2 mg/dL (ref 0.0–1.2)
Total Protein: 5.7 g/dL — ABNORMAL LOW (ref 6.5–8.1)

## 2023-06-30 LAB — PHOSPHORUS: Phosphorus: 4.5 mg/dL (ref 2.5–4.6)

## 2023-06-30 LAB — MAGNESIUM: Magnesium: 2.2 mg/dL (ref 1.7–2.4)

## 2023-06-30 MED ORDER — MORPHINE SULFATE (PF) 2 MG/ML IV SOLN
1.0000 mg | Freq: Four times a day (QID) | INTRAVENOUS | Status: DC | PRN
  Administered 2023-06-30 – 2023-07-02 (×2): 1 mg via INTRAVENOUS
  Filled 2023-06-30 (×2): qty 1

## 2023-06-30 MED ORDER — MELATONIN 3 MG PO TABS
3.0000 mg | ORAL_TABLET | Freq: Every evening | ORAL | Status: DC | PRN
  Administered 2023-06-30 – 2023-07-05 (×4): 3 mg via ORAL
  Filled 2023-06-30 (×4): qty 1

## 2023-06-30 MED ORDER — METHYLPREDNISOLONE SODIUM SUCC 40 MG IJ SOLR
40.0000 mg | Freq: Every day | INTRAMUSCULAR | Status: DC
  Administered 2023-07-01: 40 mg via INTRAVENOUS
  Filled 2023-06-30: qty 1

## 2023-06-30 MED ORDER — LORAZEPAM 2 MG/ML IJ SOLN: 0.5000 mg | INTRAMUSCULAR | Status: DC | PRN

## 2023-06-30 MED ORDER — INSULIN GLARGINE 100 UNIT/ML ~~LOC~~ SOLN
10.0000 [IU] | Freq: Two times a day (BID) | SUBCUTANEOUS | Status: DC
  Administered 2023-06-30 – 2023-07-04 (×8): 10 [IU] via SUBCUTANEOUS
  Filled 2023-06-30 (×9): qty 0.1

## 2023-06-30 MED ORDER — REVEFENACIN 175 MCG/3ML IN SOLN
175.0000 ug | Freq: Every day | RESPIRATORY_TRACT | Status: DC
  Administered 2023-07-01 – 2023-07-07 (×7): 175 ug via RESPIRATORY_TRACT
  Filled 2023-06-30 (×7): qty 3

## 2023-06-30 NOTE — Progress Notes (Signed)
 eLink Physician-Brief Progress Note Patient Name: Jeffrey Randall. DOB: 1940-05-12 MRN: [id number]   Date of Service  06/30/2023  HPI/Events of Note   83 y.o. male with medical history significant of hypertension, diabetes, COPD, lung cancer s/p radiation, alcoholism in remission, CKD 3 A, gout, hyperlipidemia, BPH presenting with worsening shortness of breath.  Metapneumovirus, COPD, lung cancer  Difficulty tolerating BiPAP.  eICU Interventions  Transitioned to high flow nasal cannula and seems to be sleeping very comfortably.  Maintain heated high flow     Intervention Category Intermediate Interventions: Respiratory distress - evaluation and management  Therasa Lorenzi 06/30/2023, 11:09 PM

## 2023-06-30 NOTE — Progress Notes (Signed)
 PROGRESS NOTE    Jeffrey Randall.  ZOX:[id number] DOB: 1941/03/01 DOA: 06/28/2023 PCP: Carin Hock, PA   Brief Narrative:  Jeffrey Randall. is a 83 y.o. male with medical history significant of hypertension, diabetes, COPD, lung cancer s/p radiation, alcoholism in remission, CKD 3 A, gout, hyperlipidemia, BPH presenting with worsening shortness of breath. Further workup reveals + Metapneumovirus. Ruling out PE but VQ Scan unable to be done as patient could not lay flat. Nebulized Medications are being further adjusted.  Continues to be dyspneic so pulmonary was consulted and they are adding Yupleri.  Given his concern for his worsening breathing is transferred to the progressive care unit for possible BiPAP if necessary.  IV morphine and lorazepam were added.  Assessment and Plan:  Acute Respiratory failure with hypoxia in the setting of Metapneumovirus with Acute COPD exacerbation Rule out pulmonary embolism -Patient with some chronic shortness of breath last 3 to 4 months worse for the last 1 day. -Likely viral.  Unable to get CTA PE study in the ED due to AKI, VQ scan has been ordered but unable to tolerate -Respiratory panel is + for Metapneumovirus.  No leukocytosis to indicate bacterial etiology.   -CXR did show irregularity/solid density at the right lung apex recommending possible CT to follow-up.  However, patient had consolidation at this location noted on CT in August with read Care Everywhere.  PET scan was done in follow-up considering patient's history of lung cancer which showed consolidation superimposed on the right upper lobe radiation fibrosis favoring infection versus inflammation but no evidence of recurrence of lung cancer or metastatic disease. -Requiring 2 L to maintain saturations.  Received DuoNeb in the ED.  Does have baseline COPD and has some wheezing. - Monitor on telemetry overnight - Add on steroids and resume IV Solumedrol and now pulmonary changes to  daily - Scheduled Xopenex/Atrovent with PRN  As needed albuterol and now they are adding Yupelri -Replaced home Trelegy with formulary Breo and Incruse but will add Afromoterol 15 mcg Neb BID and Budesonide 0.25 mg Daily -Follow-up VQ scan when able to be done. Check CXR in the AM- -Appreciate further Pulmonary Evaluation and recommendations   AKI on CKD 3A / Metabolic Acidosis:  Creatinine elevated to 2.7 from previous baseline of 1.4 (though this was 2 years ago) Did reportedly have some recent decreased p.o. intake. Received 1 L in the ED. BUN/Cr Trend: Recent Labs  Lab 06/28/23 1335 06/29/23 1019 06/30/23 0814  BUN 44* 46* 57*  CREATININE 2.70* 2.57* 2.63*  -IVF now discontinued -Continues to have a slight metabolic acidosis with CO2 17, anion gap of 8, chloride level of 110 -Avoid Nephrotoxic Medications, Contrast Dyes, Hypotension and Dehydration to Ensure Adequate Renal Perfusion and will need to Renally Adjust Meds. CTM and Trend Renal Function carefully and repeat CMP in the AM    Leukocytosis: Setting of steroid to margination.  Patient's WBC is now 15.9.  Continue to monitor for signs or symptoms of infection.  Repeat CBC  History of Lung Cancer: Status post radiation treatment in 2022.  As above had CT with abnormality last year and follow-up PET scan showed no recurrence of disease. Will need outpt f/u with his cancer doctors.  Pulmonary consulted as above   Essential HTN: C/w Amlodipine 10 mg po Daily.  Hold Valsartan/Hydrochlorothiazide in the setting of AKI. CTM BP per Protocol. Last BP reading was 140/61   Diabetes Mellitus Type 2: Home medication is 70/30 insulin SSI TID.  C/w Moderate Novolog SSI AC. CTM for Hyperglycemia with steroids for COPD. Adjust Insulin regimen as necessary. CBGs significantly elevated. Add Lantus 10 units Daily and increased to BID. Start Moderate Novolog SSI q4h and Novolog Meal Coverage 6 units TID if eats 50% or Greater   Gout: Holding  Allopurinol 100 mg po Daily in the setting of AKI   BPH: C/w Finasteride 5 mg po Daily and Tamsulosin 0.8 mg po qHS  Normocytic Anemia: Hgb/Hct went from 11.0/33.6 and is now 10.1/30.1. Check Anemia Panel in the AM. CTM for S/Sx of Bleeding; No overt bleeding noted. Repeat CBC in the AM   Hyperlipidemia: C/w Simvastatin 10 mg po Daily   Hypoalbuminemia: Patient's Albumin Trend:  Recent Labs  Lab 06/29/23 1019 06/30/23 0814  ALBUMIN 2.1* 2.1*  -Continue to Monitor and Trend and repeat CMP in the AM   DVT prophylaxis: heparin injection 5,000 Units Start: 06/28/23 1530    Code Status: Full Code Family Communication: No family present at bedside  Disposition Plan:  Level of care: Progressive Status is: Inpatient Remains inpatient appropriate because: Needs further clinical improvement and clearance by the specialists  Consultants:  Pulmonary  Procedures:  As delineated as above  Antimicrobials:  Anti-infectives (From admission, onward)    None       Subjective: Seen and examined at bedside and he was feeling more dyspneic and not improved.  Later on he had worsening dyspnea so he is transferred to the progressive care unit.  Pulmonary is consulted and adding Yupelri.  Ativan and morphine was also added for his dyspnea and he improved appropriate.  Was will be placed on BiPAP however did not needed.  No other concerns or complaints at this time.  Objective: Vitals:   06/30/23 1430 06/30/23 1433 06/30/23 1600 06/30/23 1630  BP:    (!) 140/61  Pulse:    76  Resp:    20  Temp:    98.7 F (37.1 C)  TempSrc:    Oral  SpO2: 97% 97% 97% 95%  Weight:      Height:        Intake/Output Summary (Last 24 hours) at 06/30/2023 1834 Last data filed at 06/30/2023 1400 Gross per 24 hour  Intake 240 ml  Output 1150 ml  Net -910 ml   Filed Weights   06/28/23 1219  Weight: 64.3 kg   Examination: Physical Exam:  Constitutional: Elderly chronically ill-appearing  African-American male who appears a little dyspneic and anxious Respiratory: Diminished to auscultation bilaterally with some coarse breath sounds and some slight expiratory wheezing., no rales, rhonchi or crackles. Normal respiratory effort and patient is not tachypenic. No accessory muscle use.  Wearing supplemental oxygen via nasal cannula Cardiovascular: RRR, no murmurs / rubs / gallops. S1 and S2 auscultated. No extremity edema.  Abdomen: Soft, non-tender, non-distended.  Bowel sounds positive.  GU: Deferred. Musculoskeletal: No clubbing / cyanosis of digits/nails. No joint deformity upper and lower extremities.  Skin: No rashes, lesions, ulcers. No induration; Warm and dry.  Neurologic: CN 2-12 grossly intact with no focal deficits.  Romberg sign cerebellar reflexes not assessed.  Psychiatric: Normal judgment and insight. Alert and oriented x 3.  Anxious mood and appropriate affect.   Data Reviewed: I have personally reviewed following labs and imaging studies  CBC: Recent Labs  Lab 06/28/23 1335 06/29/23 1019 06/30/23 0814  WBC 9.7 7.7 15.9*  NEUTROABS 8.6* 7.3 15.1*  HGB 11.0* 10.1* 10.1*  HCT 33.6* 31.0* 30.1*  MCV 97.7 96.6  95.9  PLT 241 245 260   Basic Metabolic Panel: Recent Labs  Lab 06/28/23 1335 06/29/23 1019 06/30/23 0814  NA 139 135 135  K 4.4 4.2 4.0  CL 111 108 110  CO2 18* 17* 17*  GLUCOSE 283* 541* 318*  BUN 44* 46* 57*  CREATININE 2.70* 2.57* 2.63*  CALCIUM 8.8* 8.4* 8.7*  MG  --  2.2 2.2  PHOS  --  4.6 4.5   GFR: Estimated Creatinine Clearance: 19.4 mL/min (A) (by C-G formula based on SCr of 2.63 mg/dL (H)). Liver Function Tests: Recent Labs  Lab 06/29/23 1019 06/30/23 0814  AST 22 17  ALT 30 26  ALKPHOS 113 123  BILITOT 0.2 0.2  PROT 5.6* 5.7*  ALBUMIN 2.1* 2.1*   No results for input(s): "LIPASE", "AMYLASE" in the last 168 hours. No results for input(s): "AMMONIA" in the last 168 hours. Coagulation Profile: No results for input(s):  "INR", "PROTIME" in the last 168 hours. Cardiac Enzymes: No results for input(s): "CKTOTAL", "CKMB", "CKMBINDEX", "TROPONINI" in the last 168 hours. BNP (last 3 results) No results for input(s): "PROBNP" in the last 8760 hours. HbA1C: Recent Labs    06/29/23 1019  HGBA1C 9.9*   CBG: Recent Labs  Lab 06/30/23 0010 06/30/23 0514 06/30/23 0735 06/30/23 1123 06/30/23 1646  GLUCAP 319* 295* 311* 385* 236*   Lipid Profile: No results for input(s): "CHOL", "HDL", "LDLCALC", "TRIG", "CHOLHDL", "LDLDIRECT" in the last 72 hours. Thyroid Function Tests: No results for input(s): "TSH", "T4TOTAL", "FREET4", "T3FREE", "THYROIDAB" in the last 72 hours. Anemia Panel: No results for input(s): "VITAMINB12", "FOLATE", "FERRITIN", "TIBC", "IRON", "RETICCTPCT" in the last 72 hours. Sepsis Labs: No results for input(s): "PROCALCITON", "LATICACIDVEN" in the last 168 hours.  Recent Results (from the past 240 hours)  Resp panel by RT-PCR (RSV, Flu A&B, Covid) Anterior Nasal Swab     Status: None   Collection Time: 06/28/23 12:27 PM   Specimen: Anterior Nasal Swab  Result Value Ref Range Status   SARS Coronavirus 2 by RT PCR NEGATIVE NEGATIVE Final   Influenza A by PCR NEGATIVE NEGATIVE Final   Influenza B by PCR NEGATIVE NEGATIVE Final    Comment: (NOTE) The Xpert Xpress SARS-CoV-2/FLU/RSV plus assay is intended as an aid in the diagnosis of influenza from Nasopharyngeal swab specimens and should not be used as a sole basis for treatment. Nasal washings and aspirates are unacceptable for Xpert Xpress SARS-CoV-2/FLU/RSV testing.  Fact Sheet for Patients: BloggerCourse.com  Fact Sheet for Healthcare Providers: SeriousBroker.it  This test is not yet approved or cleared by the [place] FDA and has been authorized for detection and/or diagnosis of SARS-CoV-2 by FDA under an Emergency Use Authorization (EUA). This EUA will remain in effect  (meaning this test can be used) for the duration of the COVID-19 declaration under Section 564(b)(1) of the Act, 21 U.S.C. section 360bbb-3(b)(1), unless the authorization is terminated or revoked.     Resp Syncytial Virus by PCR NEGATIVE NEGATIVE Final    Comment: (NOTE) Fact Sheet for Patients: BloggerCourse.com  Fact Sheet for Healthcare Providers: SeriousBroker.it  This test is not yet approved or cleared by the [place] FDA and has been authorized for detection and/or diagnosis of SARS-CoV-2 by FDA under an Emergency Use Authorization (EUA). This EUA will remain in effect (meaning this test can be used) for the duration of the COVID-19 declaration under Section 564(b)(1) of the Act, 21 U.S.C. section 360bbb-3(b)(1), unless the authorization is terminated or revoked.  Performed at [facility]  [facility], [address] [place], [place] [address]   Respiratory (~20 pathogens) panel by PCR     Status: Abnormal   Collection Time: 06/28/23  3:52 PM   Specimen: Nasopharyngeal Swab; Respiratory  Result Value Ref Range Status   Adenovirus NOT DETECTED NOT DETECTED Final   Coronavirus 229E NOT DETECTED NOT DETECTED Final    Comment: (NOTE) The Coronavirus on the Respiratory Panel, DOES NOT test for the novel  Coronavirus (2019 nCoV)    Coronavirus HKU1 NOT DETECTED NOT DETECTED Final   Coronavirus NL63 NOT DETECTED NOT DETECTED Final   Coronavirus OC43 NOT DETECTED NOT DETECTED Final   Metapneumovirus DETECTED (A) NOT DETECTED Final   Rhinovirus / Enterovirus NOT DETECTED NOT DETECTED Final   Influenza A NOT DETECTED NOT DETECTED Final   Influenza B NOT DETECTED NOT DETECTED Final   Parainfluenza Virus 1 NOT DETECTED NOT DETECTED Final   Parainfluenza Virus 2 NOT DETECTED NOT DETECTED Final   Parainfluenza Virus 3 NOT DETECTED NOT DETECTED Final   Parainfluenza Virus 4 NOT DETECTED NOT DETECTED Final   Respiratory Syncytial  Virus NOT DETECTED NOT DETECTED Final   Bordetella pertussis NOT DETECTED NOT DETECTED Final   Bordetella Parapertussis NOT DETECTED NOT DETECTED Final   Chlamydophila pneumoniae NOT DETECTED NOT DETECTED Final   Mycoplasma pneumoniae NOT DETECTED NOT DETECTED Final    Comment: Performed at [facility], [address], [place], [place] [address]    Radiology Studies: DG CHEST PORT 1 VIEW Result Date: 06/30/2023 CLINICAL DATA:  Shortness of breath. EXAM: PORTABLE CHEST 1 VIEW COMPARISON:  06/29/2023 FINDINGS: The heart size and mediastinal contours are within normal limits. Pulmonary hyperinflation again seen, consistent with COPD. Stable pleural-parenchymal scarring in the left mid and lower lung fields. No evidence of acute superimposed pulmonary infiltrate, or pleural effusion. IMPRESSION: COPD and left pleural-parenchymal scarring.  No acute findings. Electronically Signed   By: Danae Orleans M.D.   On: 06/30/2023 10:56   DG CHEST PORT 1 VIEW Result Date: 06/29/2023 CLINICAL DATA:  Shortness of breath. EXAM: PORTABLE CHEST 1 VIEW COMPARISON:  Chest radiograph dated 06/28/2023. FINDINGS: Background of emphysema. Small left pleural effusion with blunting of the left costophrenic angle. Area of nodular scarring in the right upper lobe as seen on the prior radiograph and CT. No new consolidation. No pneumothorax. Stable cardiac silhouette. Atherosclerotic calcification of the aorta. No acute osseous pathology. IMPRESSION: Small left pleural effusion. No new consolidation. Emphysema and stable right upper lobe architectural distortion. Electronically Signed   By: Elgie Collard M.D.   On: 06/29/2023 12:15   Scheduled Meds:  amLODipine  10 mg Oral Daily   arformoterol  15 mcg Nebulization BID   budesonide (PULMICORT) nebulizer solution  0.25 mg Nebulization BID   finasteride  5 mg Oral Daily   guaiFENesin  1,200 mg Oral BID   heparin  5,000 Units Subcutaneous Q8H   insulin aspart  0-15  Units Subcutaneous Q4H   insulin aspart  6 Units Subcutaneous TID WC   insulin glargine  10 Units Subcutaneous BID   levalbuterol  0.63 mg Nebulization Q6H   [START ON 07/01/2023] methylPREDNISolone (SOLU-MEDROL) injection  40 mg Intravenous Daily   nicotine  21 mg Transdermal Daily   revefenacin  175 mcg Nebulization Daily   simvastatin  10 mg Oral QHS   sodium chloride flush  3 mL Intravenous Q12H   tamsulosin  0.8 mg Oral QHS   Continuous Infusions:   LOS: 1  day   Marguerita Merles, DO [facility] Available via Epic secure chat 7am-7pm After these hours, please refer to coverage provider listed on [email or web address] 06/30/2023, 6:34 PM

## 2023-06-30 NOTE — Plan of Care (Signed)
  Problem: Coping: Goal: Ability to adjust to condition or change in health will improve Outcome: Progressing   Problem: Health Behavior/Discharge Planning: Goal: Ability to manage health-related needs will improve Outcome: Progressing   Problem: Metabolic: Goal: Ability to maintain appropriate glucose levels will improve Outcome: Progressing   Problem: Tissue Perfusion: Goal: Adequacy of tissue perfusion will improve Outcome: Progressing   Problem: Clinical Measurements: Goal: Respiratory complications will improve Outcome: Progressing   Problem: Coping: Goal: Level of anxiety will decrease Outcome: Progressing   Problem: Activity: Goal: Risk for activity intolerance will decrease Outcome: Progressing

## 2023-06-30 NOTE — Progress Notes (Signed)
 [facility] night cross cover note:   I was notified by RN of the patient's request for a sleep aid. I subsequently placed order for prn melatonin for insomnia.     Newton Pigg, DO Hospitalist

## 2023-06-30 NOTE — Significant Event (Signed)
 Rapid Response Event Note   Reason for Call :  Dyspnea  Initial Focused Assessment:  Pt lying in bed, AO. Breathing is regular, some accessory muscle use noted with expiration. BBS with prolonged expiratory wheeze. Pt described breathing as, "not being able to get air in." Skin is warm, pink, dry. Pt denies pain.   VS: BP 150/58, HR 72, RR 26, SpO2 97% on 2LNC  Interventions:  -BiPAP -Transfer to PCU -PRN Morphine and Ativan added  Plan of Care:  Current assessment resembles assessments noted in providers note and consult notes from today. Primary RN concerned regarding on-going dyspnea. Above interventions added.   Event Summary:  MD Notified: Dr. Marland Mcalpine & Sinda Du., NP Call Time: 1407 Arrival Time: 1410 End Time:  1620  Jennye Moccasin, RN

## 2023-06-30 NOTE — Telephone Encounter (Signed)
 Called and spoke with patients son, Malacai Grantz.  He is very concerned about his dad.  Patient is at [facility], 4th floor.  Son would like for Dr. Isaiah Serge to be aware that his dad is in the hospital and is having a very hard time with his breathing.  Patient cannot sit up and breathe comfortably.  Patient was unable to complete a CT scan last night due to breathing problems while trying to get into the scanner.    The son would like Dr. Isaiah Serge to consult with the physicians currently treating his dad Gaspar Garbe).  The son said he would appreciate a call if possible from Dr. Isaiah Serge to explain the difficult time his dad is having with his breathing and the diagnosis and treatment he is getting.  Dr. Isaiah Serge, if possible, could you consult with the attending physicians of this patient and also call the patients son, Shaydon Lease, at [phone].  Thank you.

## 2023-06-30 NOTE — Inpatient Diabetes Management (Signed)
 Inpatient Diabetes Program Recommendations  AACE/ADA: New Consensus Statement on Inpatient Glycemic Control (2015)  Target Ranges:  Prepandial:   less than 140 mg/dL      Peak postprandial:   less than 180 mg/dL (1-2 hours)      Critically ill patients:  140 - 180 mg/dL   Lab Results  Component Value Date   GLUCAP 311 (H) 06/30/2023   HGBA1C 9.9 (H) 06/29/2023    Latest Reference Range & Units 06/29/23 20:15 06/30/23 00:10 06/30/23 05:14 06/30/23 07:35  Glucose-Capillary 70 - 99 mg/dL 952 (H) 841 (H) 324 (H) 311 (H)  (H): Data is abnormally high Review of Glycemic Control  Diabetes history: DM2 Outpatient Diabetes medications: 70/30 insulin 2-14 units TID, Farxiga 10 mg daily, Libre 3 CGM Current orders for Inpatient glycemic control: Lantus 10 units daily, Novolog 0-15 units correction scale every 4 hours, Novolog 6 units TID  Inpatient Diabetes Program Recommendations:   Noted that blood sugars have been greater than 180 mg/dl.   Recommend increasing Lantus to 10 units BID. Titrate dosages as needed. Continue Novolog 0-15 units correction scale and Novolog 6 units TID with meals if eating at least 50% of meal.  Will continue to monitor blood sugars while in the hospital.  Smith Mince RN BSN CDE Diabetes Coordinator Pager: [phone]  8am-5pm

## 2023-06-30 NOTE — Consult Note (Signed)
 NAME:  Jeffrey Randall., MRN:  [id number], DOB:  11-28-1940, LOS: 1 ADMISSION DATE:  06/28/2023, CONSULTATION DATE:  06/30/23 REFERRING MD:  Marland Mcalpine, CHIEF COMPLAINT:  SOB   History of Present Illness:  83 yo M PMH COPD, Lung ca s/p radiation, ongoing tobacco use, CKD 3a, HTN, DM who was admitted to [facility] 06/28/23 after presenting to ED w CC progressive SOB. Associated productive coug, worse DOE with only short distances. Minimal improvement w nebs.   CXR 3/18 w L basilar opacity and irregular density in r apex. A CT chest was recommended.  RVP revealed + metapneumovirus  Was started on steroids and breo + incuse in place of home trelegy + [facility] atrovent VQ scan ordered.   Pertinent  Medical History   Past Medical History:  Diagnosis Date   Alcoholism in recovery [facility])    Aortic atherosclerosis (HCC)    Back pain 06/17/2014   Chronic lung disease    CKD (chronic kidney disease)    COPD (chronic obstructive pulmonary disease) (HCC) 07/23/2014   Essential hypertension    Gout    History of lung cancer    History of pancreatitis    Iron deficiency anemia    Lumbar disc disease    Mixed hyperlipidemia    Notalgia    Prostate hypertrophy    Pulmonary nodules 07/23/2014   Swelling    Tobacco use disorder 07/23/2014   Tumor of lung 01/26/2021   Type 2 diabetes mellitus without complication (HCC)    Uncontrolled type 2 diabetes mellitus with hyperglycemia, without long-term current use of insulin (HCC)      Significant Hospital Events: Including procedures, antibiotic start and stop dates in addition to other pertinent events   3/18 presented with SOB, admitted for AECOPD with metapneumovirus  3/19 BD changed to brovana budesonide atrovent PRN albuterol  3/20 PCCM is consulted for dyspnea. SpO2 98-100 on 1.5-2.5L   Interim History / Subjective:  Continues to report that he feels very poor but dyspnea is improved after neb treatment  Objective   Blood pressure 139/65, pulse 72,  temperature 97.9 F (36.6 C), resp. rate 18, height 5\' 10"  (1.778 m), weight 64.3 kg, SpO2 98%.        Intake/Output Summary (Last 24 hours) at 06/30/2023 1214 Last data filed at 06/30/2023 1100 Gross per 24 hour  Intake 240 ml  Output 500 ml  Net -260 ml   Filed Weights   06/28/23 1219  Weight: 64.3 kg    Examination: General: Acute on chronically ill appearing elderly male lying in bed, in NAD HEENT: [place]/AT, MM pink/moist, PERRL,  Neuro: Alert and oriented x3, non-focal  CV: s1s2 regular rate and rhythm, no murmur, rubs, or gallops,  PULM:  Diminished bilaterally, no increased work of breathing, productive cough  GI: soft, bowel sounds active in all 4 quadrants, non-tender, non-distended, tolerating Oral diet  Extremities: warm/dry, no edema  Skin: no rashes or lesions  Resolved Hospital Problem list     Assessment & Plan:  Acute hypoxic respiratory failure in the setting of Metapneumovirus and acute exacerbation of COPD -On Trelegy and Butisol at baseline  Current daily tobacco use History of stage 1A lung cancer presumed NSCLC in the right upper lobe with concern for post radiation fibrosis  -Treated with radiation in 2022 -Repeat PET scan 12/22/22 per CareEverywhere with no signs of recurrent cancer but signs of radiation fibrosis within the right lung apex and mid lung  P: Supplemental oxygen for sat goal >  90 Continue Brovana, Xopenex, and Pulmicort  Continue Prednisone  As needed cough suppressants  Cessation education when appropriate Encourage pulmonary hygiene  Mobilize as able   Outpatient pulmonary follow up, consider repeat outpatient CT  Pending VQ scan   Best Practice (right click and "Reselect all SmartList Selections" daily)  Per primary   Labs   CBC: Recent Labs  Lab 06/28/23 1335 06/29/23 1019 06/30/23 0814  WBC 9.7 7.7 15.9*  NEUTROABS 8.6* 7.3 15.1*  HGB 11.0* 10.1* 10.1*  HCT 33.6* 31.0* 30.1*  MCV 97.7 96.6 95.9  PLT 241 245 260     Basic Metabolic Panel: Recent Labs  Lab 06/28/23 1335 06/29/23 1019 06/30/23 0814  NA 139 135 135  K 4.4 4.2 4.0  CL 111 108 110  CO2 18* 17* 17*  GLUCOSE 283* 541* 318*  BUN 44* 46* 57*  CREATININE 2.70* 2.57* 2.63*  CALCIUM 8.8* 8.4* 8.7*  MG  --  2.2 2.2  PHOS  --  4.6 4.5   GFR: Estimated Creatinine Clearance: 19.4 mL/min (A) (by C-G formula based on SCr of 2.63 mg/dL (H)). Recent Labs  Lab 06/28/23 1335 06/29/23 1019 06/30/23 0814  WBC 9.7 7.7 15.9*    Liver Function Tests: Recent Labs  Lab 06/29/23 1019 06/30/23 0814  AST 22 17  ALT 30 26  ALKPHOS 113 123  BILITOT 0.2 0.2  PROT 5.6* 5.7*  ALBUMIN 2.1* 2.1*   No results for input(s): "LIPASE", "AMYLASE" in the last 168 hours. No results for input(s): "AMMONIA" in the last 168 hours.  ABG No results found for: "PHART", "PCO2ART", "PO2ART", "HCO3", "TCO2", "ACIDBASEDEF", "O2SAT"   Coagulation Profile: No results for input(s): "INR", "PROTIME" in the last 168 hours.  Cardiac Enzymes: No results for input(s): "CKTOTAL", "CKMB", "CKMBINDEX", "TROPONINI" in the last 168 hours.  HbA1C: Hgb A1c MFr Bld  Date/Time Value Ref Range Status  06/29/2023 10:19 AM 9.9 (H) 4.8 - 5.6 % Final    Comment:    (NOTE) Pre diabetes:          5.7%-6.4%  Diabetes:              >6.4%  Glycemic control for   <7.0% adults with diabetes   06/18/2014 06:38 AM 8.7 (H) 4.8 - 5.6 % Final    Comment:    (NOTE)         Pre-diabetes: 5.7 - 6.4         Diabetes: >6.4         Glycemic control for adults with diabetes: <7.0     CBG: Recent Labs  Lab 06/29/23 2015 06/30/23 0010 06/30/23 0514 06/30/23 0735 06/30/23 1123  GLUCAP 289* 319* 295* 311* 385*    Review of Systems:   Please see the history of present illness. All other systems reviewed and are negative   Past Medical History:  He,  has a past medical history of Alcoholism in recovery [facility]), Aortic atherosclerosis (HCC), Back pain (06/17/2014), Chronic  lung disease, CKD (chronic kidney disease), COPD (chronic obstructive pulmonary disease) (HCC) (07/23/2014), Essential hypertension, Gout, History of lung cancer, History of pancreatitis, Iron deficiency anemia, Lumbar disc disease, Mixed hyperlipidemia, Notalgia, Prostate hypertrophy, Pulmonary nodules (07/23/2014), Swelling, Tobacco use disorder (07/23/2014), Tumor of lung (01/26/2021), Type 2 diabetes mellitus without complication (HCC), and Uncontrolled type 2 diabetes mellitus with hyperglycemia, without long-term current use of insulin (HCC).   Surgical History:   Past Surgical History:  Procedure Laterality Date   BACK SURGERY  Social History:   reports that he has been smoking cigarettes. He started smoking about 61 years ago. He has a 90 pack-year smoking history. He has never used smokeless tobacco. He reports that he does not currently use alcohol after a past usage of about 3.0 standard drinks of alcohol per week. He reports that he does not use drugs.   Family History:  His family history includes Lung cancer in his father and mother.   Allergies Allergies  Allergen Reactions   Chantix Continuing Month [Varenicline] Other (See Comments)    Doesn't work for patient   Januvia [Sitagliptin] Other (See Comments)    Class Contraindicated   Lisinopril Swelling    Possible tongue swelling. ARB's okay   Motrin [Ibuprofen] Other (See Comments)    Contraindicated by Renal disease   Remeron [Mirtazapine] Other (See Comments)    Excess somnolence on the 15 mg dose   Penicillins Other (See Comments)    Facial swelling     Home Medications  Prior to Admission medications   Medication Sig Start Date End Date Taking? Authorizing Provider  albuterol (VENTOLIN HFA) 108 (90 Base) MCG/ACT inhaler Inhale 1 puff into the lungs daily as needed for wheezing or shortness of breath. 09/01/21  Yes [provider]  allopurinol (ZYLOPRIM) 100 MG tablet Take 100 mg by mouth every  morning. 02/25/22  Yes [provider]  amLODipine (NORVASC) 10 MG tablet Take 10 mg by mouth daily.   Yes [provider]  aspirin EC 81 MG tablet Take 1 tablet (81 mg total) by mouth daily. Swallow whole. 05/03/22  Yes Georgeanna Lea, MD  Cyanocobalamin (VITAMIN B-12) 2500 MCG SUBL Place 2,500 mcg under the tongue daily.   Yes [provider]  dapagliflozin propanediol (FARXIGA) 10 MG TABS tablet Take 1 tablet by mouth daily. 09/03/21  Yes [provider]  finasteride (PROSCAR) 5 MG tablet Take 5 mg by mouth daily. 02/25/22  Yes [provider]  Fluticasone-Umeclidin-Vilant (TRELEGY ELLIPTA) 200-62.5-25 MCG/ACT AEPB Inhale 1 puff into the lungs daily. 05/05/23 05/04/24 Yes Mannam, Praveen, MD  insulin aspart protamine - aspart (NOVOLOG 70/30 FLEXPEN) (70-30) 100 UNIT/ML FlexPen Inject 2-14 Units into the skin in the morning, at noon, and at bedtime.   Yes [provider]  ipratropium-albuterol (DUONEB) 0.5-2.5 (3) MG/3ML SOLN Take 3 mLs by nebulization every 4 (four) hours as needed. Patient taking differently: Take 3 mLs by nebulization daily as needed (wheezing/sob). 05/05/23 05/04/24 Yes Mannam, Praveen, MD  Omega-3 Fatty Acids (FISH OIL) 1200 MG CAPS Take 1,200 mg by mouth daily.   Yes [provider]  simvastatin (ZOCOR) 10 MG tablet Take 10 mg by mouth at bedtime. 02/25/22  Yes [provider]  tamsulosin (FLOMAX) 0.4 MG CAPS capsule Take 0.8 mg by mouth at bedtime. 02/25/22  Yes [provider]  valsartan-hydrochlorothiazide (DIOVAN-HCT) 320-25 MG tablet Take 1 tablet by mouth daily. 04/28/22  Yes [provider]  Albuterol Sulfate 2.5 MG/0.5ML NEBU Inhale into the lungs. Patient not taking: Reported on 06/28/2023    [provider]  Continuous Glucose Sensor (FREESTYLE LIBRE 3 PLUS SENSOR) MISC Use as directed and change every 14 (fourteen) days. 02/15/23        Critical care time:  NA   Breannah Kratt D. Harris, NP-C [facility] Personal contact information can be found on Amion  If no contact or response made please call [phone] 06/30/2023, 1:06 PM

## 2023-06-30 NOTE — Progress Notes (Signed)
 Called Report to [facility] on [address]. Patient going to room 15. This RN called and updated Daughter Alfreda.

## 2023-06-30 NOTE — Telephone Encounter (Signed)
 Patient is currently in hospital being seen for a metapneumo virus. Son of patient wanted Dr.Mannam to know and to possible check in on his father either in person or by phone. Patient is at [facility]. [phone]

## 2023-07-01 ENCOUNTER — Inpatient Hospital Stay (HOSPITAL_COMMUNITY)

## 2023-07-01 DIAGNOSIS — J9601 Acute respiratory failure with hypoxia: Secondary | ICD-10-CM | POA: Diagnosis not present

## 2023-07-01 DIAGNOSIS — I10 Essential (primary) hypertension: Secondary | ICD-10-CM | POA: Diagnosis not present

## 2023-07-01 DIAGNOSIS — N400 Enlarged prostate without lower urinary tract symptoms: Secondary | ICD-10-CM | POA: Diagnosis not present

## 2023-07-01 DIAGNOSIS — N179 Acute kidney failure, unspecified: Secondary | ICD-10-CM | POA: Diagnosis not present

## 2023-07-01 LAB — CBC WITH DIFFERENTIAL/PLATELET
Abs Immature Granulocytes: 0.07 10*3/uL (ref 0.00–0.07)
Basophils Absolute: 0 10*3/uL (ref 0.0–0.1)
Basophils Relative: 0 %
Eosinophils Absolute: 0 10*3/uL (ref 0.0–0.5)
Eosinophils Relative: 0 %
HCT: 28.9 % — ABNORMAL LOW (ref 39.0–52.0)
Hemoglobin: 9.7 g/dL — ABNORMAL LOW (ref 13.0–17.0)
Immature Granulocytes: 1 %
Lymphocytes Relative: 2 %
Lymphs Abs: 0.3 10*3/uL — ABNORMAL LOW (ref 0.7–4.0)
MCH: 31.4 pg (ref 26.0–34.0)
MCHC: 33.6 g/dL (ref 30.0–36.0)
MCV: 93.5 fL (ref 80.0–100.0)
Monocytes Absolute: 0.6 10*3/uL (ref 0.1–1.0)
Monocytes Relative: 4 %
Neutro Abs: 14.1 10*3/uL — ABNORMAL HIGH (ref 1.7–7.7)
Neutrophils Relative %: 93 %
Platelets: 268 10*3/uL (ref 150–400)
RBC: 3.09 MIL/uL — ABNORMAL LOW (ref 4.22–5.81)
RDW: 15.6 % — ABNORMAL HIGH (ref 11.5–15.5)
WBC: 15 10*3/uL — ABNORMAL HIGH (ref 4.0–10.5)
nRBC: 0 % (ref 0.0–0.2)

## 2023-07-01 LAB — VITAMIN B12: Vitamin B-12: 1449 pg/mL — ABNORMAL HIGH (ref 180–914)

## 2023-07-01 LAB — COMPREHENSIVE METABOLIC PANEL
ALT: 25 U/L (ref 0–44)
AST: 23 U/L (ref 15–41)
Albumin: 2.1 g/dL — ABNORMAL LOW (ref 3.5–5.0)
Alkaline Phosphatase: 102 U/L (ref 38–126)
Anion gap: 9 (ref 5–15)
BUN: 67 mg/dL — ABNORMAL HIGH (ref 8–23)
CO2: 18 mmol/L — ABNORMAL LOW (ref 22–32)
Calcium: 8.7 mg/dL — ABNORMAL LOW (ref 8.9–10.3)
Chloride: 111 mmol/L (ref 98–111)
Creatinine, Ser: 2.75 mg/dL — ABNORMAL HIGH (ref 0.61–1.24)
GFR, Estimated: 22 mL/min — ABNORMAL LOW (ref >60)
Glucose, Bld: 196 mg/dL — ABNORMAL HIGH (ref 70–99)
Potassium: 4.5 mmol/L (ref 3.5–5.1)
Sodium: 138 mmol/L (ref 135–145)
Total Bilirubin: 0.3 mg/dL (ref 0.0–1.2)
Total Protein: 5.8 g/dL — ABNORMAL LOW (ref 6.5–8.1)

## 2023-07-01 LAB — RETICULOCYTES
Immature Retic Fract: 16.8 % — ABNORMAL HIGH (ref 2.3–15.9)
RBC.: 3.02 MIL/uL — ABNORMAL LOW (ref 4.22–5.81)
Retic Count, Absolute: 38.1 10*3/uL (ref 19.0–186.0)
Retic Ct Pct: 1.3 % (ref 0.4–3.1)

## 2023-07-01 LAB — FERRITIN: Ferritin: 147 ng/mL (ref 24–336)

## 2023-07-01 LAB — IRON AND TIBC
Iron: 35 ug/dL — ABNORMAL LOW (ref 45–182)
Saturation Ratios: 16 % — ABNORMAL LOW (ref 17.9–39.5)
TIBC: 224 ug/dL — ABNORMAL LOW (ref 250–450)
UIBC: 189 ug/dL

## 2023-07-01 LAB — GLUCOSE, CAPILLARY
Glucose-Capillary: 190 mg/dL — ABNORMAL HIGH (ref 70–99)
Glucose-Capillary: 257 mg/dL — ABNORMAL HIGH (ref 70–99)
Glucose-Capillary: 255 mg/dL — ABNORMAL HIGH (ref 70–99)
Glucose-Capillary: 316 mg/dL — ABNORMAL HIGH (ref 70–99)
Glucose-Capillary: 235 mg/dL — ABNORMAL HIGH (ref 70–99)

## 2023-07-01 LAB — FOLATE: Folate: 8.6 ng/mL (ref >5.9)

## 2023-07-01 LAB — PHOSPHORUS: Phosphorus: 4.5 mg/dL (ref 2.5–4.6)

## 2023-07-01 LAB — MAGNESIUM: Magnesium: 2.2 mg/dL (ref 1.7–2.4)

## 2023-07-01 MED ORDER — METHYLPREDNISOLONE SODIUM SUCC 40 MG IJ SOLR
40.0000 mg | Freq: Two times a day (BID) | INTRAMUSCULAR | Status: AC
  Administered 2023-07-01 – 2023-07-04 (×7): 40 mg via INTRAVENOUS
  Filled 2023-07-01 (×7): qty 1

## 2023-07-01 MED ORDER — BUDESONIDE 0.5 MG/2ML IN SUSP
0.5000 mg | Freq: Two times a day (BID) | RESPIRATORY_TRACT | Status: DC
  Administered 2023-07-01 – 2023-07-07 (×12): 0.5 mg via RESPIRATORY_TRACT
  Filled 2023-07-01 (×12): qty 2

## 2023-07-01 MED ORDER — ALBUTEROL SULFATE (2.5 MG/3ML) 0.083% IN NEBU
2.5000 mg | INHALATION_SOLUTION | RESPIRATORY_TRACT | Status: DC | PRN
  Administered 2023-07-04: 2.5 mg via RESPIRATORY_TRACT
  Filled 2023-07-01: qty 3

## 2023-07-01 MED ORDER — ARFORMOTEROL TARTRATE 15 MCG/2ML IN NEBU
15.0000 ug | INHALATION_SOLUTION | Freq: Two times a day (BID) | RESPIRATORY_TRACT | Status: DC
  Administered 2023-07-01 – 2023-07-07 (×12): 15 ug via RESPIRATORY_TRACT
  Filled 2023-07-01 (×12): qty 2

## 2023-07-01 MED ORDER — INSULIN ASPART 100 UNIT/ML IJ SOLN
0.0000 [IU] | Freq: Every day | INTRAMUSCULAR | Status: DC
  Administered 2023-07-01: 2 [IU] via SUBCUTANEOUS
  Administered 2023-07-02: 3 [IU] via SUBCUTANEOUS
  Administered 2023-07-03: 4 [IU] via SUBCUTANEOUS
  Administered 2023-07-04: 3 [IU] via SUBCUTANEOUS
  Administered 2023-07-05: 5 [IU] via SUBCUTANEOUS
  Administered 2023-07-06: 2 [IU] via SUBCUTANEOUS

## 2023-07-01 MED ORDER — INSULIN ASPART 100 UNIT/ML IJ SOLN
0.0000 [IU] | Freq: Three times a day (TID) | INTRAMUSCULAR | Status: DC
  Administered 2023-07-01: 11 [IU] via SUBCUTANEOUS
  Administered 2023-07-01: 8 [IU] via SUBCUTANEOUS
  Administered 2023-07-02: 15 [IU] via SUBCUTANEOUS
  Administered 2023-07-02: 3 [IU] via SUBCUTANEOUS
  Administered 2023-07-02: 8 [IU] via SUBCUTANEOUS
  Administered 2023-07-03: 5 [IU] via SUBCUTANEOUS
  Administered 2023-07-03: 3 [IU] via SUBCUTANEOUS
  Administered 2023-07-03: 8 [IU] via SUBCUTANEOUS
  Administered 2023-07-04: 15 [IU] via SUBCUTANEOUS
  Administered 2023-07-04: 11 [IU] via SUBCUTANEOUS
  Administered 2023-07-04: 5 [IU] via SUBCUTANEOUS
  Administered 2023-07-05 (×2): 11 [IU] via SUBCUTANEOUS
  Administered 2023-07-05: 5 [IU] via SUBCUTANEOUS
  Administered 2023-07-06: 15 [IU] via SUBCUTANEOUS
  Administered 2023-07-06: 5 [IU] via SUBCUTANEOUS
  Administered 2023-07-06 – 2023-07-07 (×2): 3 [IU] via SUBCUTANEOUS
  Administered 2023-07-07: 5 [IU] via SUBCUTANEOUS

## 2023-07-01 MED ORDER — INSULIN ASPART 100 UNIT/ML IJ SOLN: 0.0000 [IU] | Freq: Three times a day (TID) | INTRAMUSCULAR | Status: DC

## 2023-07-01 MED ORDER — ENSURE ENLIVE PO LIQD
237.0000 mL | Freq: Two times a day (BID) | ORAL | Status: DC
  Administered 2023-07-01 – 2023-07-07 (×10): 237 mL via ORAL

## 2023-07-01 NOTE — Progress Notes (Signed)
 PT Cancellation Note  Patient Details Name: Jeffrey Randall. MRN: [id number] DOB: 1941-01-26   Cancelled Treatment:    Reason Eval/Treat Not Completed: Medical issues which prohibited therapy. Plan for VQ scan and to check CXR today to rule out PE. Acute PT to hold and re-attempt as medically appropriate.Acute PT to follow.  Hilton Cork, PT, DPT Secure Chat Preferred  Rehab Office [phone]  Arturo Morton Brion Aliment 07/01/2023, 9:10 AM

## 2023-07-01 NOTE — Progress Notes (Signed)
 PROGRESS NOTE    Jeffrey Randall.  VHQ:[id number] DOB: 07-05-1940 DOA: 06/28/2023 PCP: Carin Hock, PA   Brief Narrative:  Jeffrey Randall. is a 83 y.o. male with medical history significant of hypertension, diabetes, COPD, lung cancer s/p radiation, alcoholism in remission, CKD 3 A, gout, hyperlipidemia, BPH presenting with worsening shortness of breath. Further workup reveals + Metapneumovirus. Ruling out PE but VQ Scan unable to be done as patient could not lay flat. Nebulized Medications are being further adjusted.    Continued to be dyspneic so pulmonary was consulted and they are adding Yupleri and was transferrred to the  Progressive care unit for possible BiPAP if necessary.  IV morphine and lorazepam were added.  Now on heated high flow nasal cannula and appeared will be more comfortable.  Assessment and Plan:  Acute Respiratory failure with hypoxia in the setting of Metapneumovirus with Acute COPD exacerbation Rule out pulmonary embolism -Respiratory panel is + for Metapneumovirus.  No leukocytosis to indicate bacterial etiology.   -CXR did show irregularity/solid density at the right lung apex recommending possible CT to follow-up.  However, patient had consolidation at this location noted on CT in August with read Care Everywhere.  PET scan was done in follow-up considering patient's history of lung cancer which showed consolidation superimposed on the right upper lobe radiation fibrosis favoring infection versus inflammation but no evidence of recurrence of lung cancer or metastatic disease. -Add on steroids and resume IV Solumedrol and now pulmonary changes to daily (40 mg daily) -Scheduled Xopenex/Atrovent with PRN  As needed albuterol and now they are adding revefenacin 175 mcg nebs daily -Replaced home Trelegy with formulary Breo and Incruse but will add Afromoterol 15 mcg Neb BID and Budesonide 0.25 mg Daily -Follow-up VQ scan when able to be done when he is able to lie  flat. Check CXR in the AM-chest x-ray this a.m. showed "Chronic lung disease with Emphysema (ICD10-J43.9) and scarring. No acute cardiopulmonary abnormality." -Appreciate further Pulmonary Evaluation and recommendations and he is transition to heated high flow nasal cannula 40 L oxygen with 30% FiO2 and PT OT recommending home health at discharge.   AKI on CKD 3A / Metabolic Acidosis:  Creatinine elevated to 2.7 from previous baseline of 1.4 (though this was 2 years ago) Did reportedly have some recent decreased p.o. intake. Received 1 L in the ED. BUN/Cr Trend: Recent Labs  Lab 06/28/23 1335 06/29/23 1019 06/30/23 0814 07/01/23 0332  BUN 44* 46* 57* 67*  CREATININE 2.70* 2.57* 2.63* 2.75*  -IVF now discontinued; this appears to be likely his new baseline -Continues to have a slight metabolic acidosis with CO2 of 18, anion gap of 9, chloride level 111 -Avoid Nephrotoxic Medications, Contrast Dyes, Hypotension and Dehydration to Ensure Adequate Renal Perfusion and will need to Renally Adjust Meds. CTM and Trend Renal Function carefully and repeat CMP in the AM    Leukocytosis: Setting of steroid demargination.  Patient's WBC is now 15.9.  Continue to monitor for signs or symptoms of infection.  Repeat CBC showed mild improvement today at 15.0.  Continue to monitor for signs and symptoms of infection  History of Lung Cancer: Status post radiation treatment in 2022.  As above had CT with abnormality last year and follow-up PET scan showed no recurrence of disease. Will need outpt f/u with his cancer doctors.  Pulmonary consulted as above   Essential HTN: C/w Amlodipine 10 mg po Daily.  Hold Valsartan/Hydrochlorothiazide in the setting of AKI. CTM  BP per Protocol. Last BP reading was 159/54   Diabetes Mellitus Type 2: Home medication is 70/30 insulin SSI TID. C/w Moderate Novolog SSI AC. CTM for Hyperglycemia with steroids for COPD. Adjust Insulin regimen as necessary. CBGs significantly elevated.  Add Lantus 10 units Daily and increased to BID. Start Moderate Novolog SSI q4h and Novolog Meal Coverage 6 units TID if eats 50% or Greater.  CBGs ranging from 190-316 now   Gout: Holding Allopurinol 100 mg po Daily in the setting of AKI   BPH: C/w Finasteride 5 mg po Daily and Tamsulosin 0.8 mg po qHS  Normocytic Anemia: Hgb/Hct went from 11.0/33.6 -> 10.1/30.1 and is now 9.7/28.9.  Anemia panel was checked and showed an iron level of 35, UIBC 189, TIBC 224, saturation ratios of 16%, ferritin of 147, folate level 8.6 and vitamin B12 1449 CTM for S/Sx of Bleeding; No overt bleeding noted. Repeat CBC in the AM   Hyperlipidemia: C/w Simvastatin 10 mg po Daily   Hypoalbuminemia: Albumin Level stable at 2.1. CTM and Trend and repeat CMP in the AM   DVT prophylaxis: heparin injection 5,000 Units Start: 06/28/23 1530    Code Status: Full Code Family Communication: No family currently at bedside  Disposition Plan:  Level of care: Progressive Status is: Inpatient Remains inpatient appropriate because: Needs further clinical improvement and clearance by the specialists   Consultants:  Pulmonary  Procedures:  As delineated as above  Antimicrobials:  Anti-infectives (From admission, onward)    None       Subjective: And examined at bedside and states he is still little bit dyspneic and feels better on the high flow nasal cannula.  No nausea or vomiting.  Any lightheadedness or dizziness.  No other concerns or complaints at this time.  Objective: Vitals:   07/01/23 1639 07/01/23 1726 07/01/23 1952 07/01/23 2005  BP: (!) 154/64   (!) 159/54  Pulse: 77  80   Resp: (!) 29  (!) 24   Temp: 98.1 F (36.7 C)   98.3 F (36.8 C)  TempSrc: Oral   Oral  SpO2: 100% 100% 100%   Weight:      Height:        Intake/Output Summary (Last 24 hours) at 07/01/2023 2048 Last data filed at 07/01/2023 0900 Gross per 24 hour  Intake --  Output 400 ml  Net -400 ml   Filed Weights   06/28/23  1219  Weight: 64.3 kg   Examination: Physical Exam:  Constitutional: Thin chronically ill-appearing African-American male who is in slight respiratory distress Respiratory: Diminished to auscultation bilaterally with some coarse breath sounds and some expiratory wheezing., no wheezing, rales, rhonchi or crackles. Normal respiratory effort and patient is not tachypenic. No accessory muscle use.  Wearing supplemental oxygen via heated high flow nasal cannula today Cardiovascular: RRR, no murmurs / rubs / gallops. S1 and S2 auscultated. No extremity edema. Abdomen: Soft, non-tender, non-distended. Bowel sounds positive.  GU: Deferred. Musculoskeletal: No clubbing / cyanosis of digits/nails. No joint deformity upper and lower extremities. Skin: No rashes, lesions, ulcers on limited skin evaluation. No induration; Warm and dry.  Neurologic: CN 2-12 grossly intact with no focal deficits. Romberg sign and cerebellar reflexes not assessed.  Psychiatric: Normal judgment and insight. Alert and oriented x 3.  He was a little anxious  Data Reviewed: I have personally reviewed following labs and imaging studies  CBC: Recent Labs  Lab 06/28/23 1335 06/29/23 1019 06/30/23 0814 07/01/23 0332  WBC 9.7 7.7 15.9*  15.0*  NEUTROABS 8.6* 7.3 15.1* 14.1*  HGB 11.0* 10.1* 10.1* 9.7*  HCT 33.6* 31.0* 30.1* 28.9*  MCV 97.7 96.6 95.9 93.5  PLT 241 245 260 268   Basic Metabolic Panel: Recent Labs  Lab 06/28/23 1335 06/29/23 1019 06/30/23 0814 07/01/23 0332  NA 139 135 135 138  K 4.4 4.2 4.0 4.5  CL 111 108 110 111  CO2 18* 17* 17* 18*  GLUCOSE 283* 541* 318* 196*  BUN 44* 46* 57* 67*  CREATININE 2.70* 2.57* 2.63* 2.75*  CALCIUM 8.8* 8.4* 8.7* 8.7*  MG  --  2.2 2.2 2.2  PHOS  --  4.6 4.5 4.5   GFR: Estimated Creatinine Clearance: 18.5 mL/min (A) (by C-G formula based on SCr of 2.75 mg/dL (H)). Liver Function Tests: Recent Labs  Lab 06/29/23 1019 06/30/23 0814 07/01/23 0332  AST 22 17 23    ALT 30 26 25   ALKPHOS 113 123 102  BILITOT 0.2 0.2 0.3  PROT 5.6* 5.7* 5.8*  ALBUMIN 2.1* 2.1* 2.1*   No results for input(s): "LIPASE", "AMYLASE" in the last 168 hours. No results for input(s): "AMMONIA" in the last 168 hours. Coagulation Profile: No results for input(s): "INR", "PROTIME" in the last 168 hours. Cardiac Enzymes: No results for input(s): "CKTOTAL", "CKMB", "CKMBINDEX", "TROPONINI" in the last 168 hours. BNP (last 3 results) No results for input(s): "PROBNP" in the last 8760 hours. HbA1C: Recent Labs    06/29/23 1019  HGBA1C 9.9*   CBG: Recent Labs  Lab 07/01/23 0409 07/01/23 0759 07/01/23 1153 07/01/23 1637 07/01/23 1944  GLUCAP 190* 257* 255* 316* 235*   Lipid Profile: No results for input(s): "CHOL", "HDL", "LDLCALC", "TRIG", "CHOLHDL", "LDLDIRECT" in the last 72 hours. Thyroid Function Tests: No results for input(s): "TSH", "T4TOTAL", "FREET4", "T3FREE", "THYROIDAB" in the last 72 hours. Anemia Panel: Recent Labs    07/01/23 0332  VITAMINB12 1,449*  FOLATE 8.6  FERRITIN 147  TIBC 224*  IRON 35*  RETICCTPCT 1.3   Sepsis Labs: No results for input(s): "PROCALCITON", "LATICACIDVEN" in the last 168 hours.  Recent Results (from the past 240 hours)  Resp panel by RT-PCR (RSV, Flu A&B, Covid) Anterior Nasal Swab     Status: None   Collection Time: 06/28/23 12:27 PM   Specimen: Anterior Nasal Swab  Result Value Ref Range Status   SARS Coronavirus 2 by RT PCR NEGATIVE NEGATIVE Final   Influenza A by PCR NEGATIVE NEGATIVE Final   Influenza B by PCR NEGATIVE NEGATIVE Final    Comment: (NOTE) The Xpert Xpress SARS-CoV-2/FLU/RSV plus assay is intended as an aid in the diagnosis of influenza from Nasopharyngeal swab specimens and should not be used as a sole basis for treatment. Nasal washings and aspirates are unacceptable for Xpert Xpress SARS-CoV-2/FLU/RSV testing.  Fact Sheet for Patients: BloggerCourse.com  Fact  Sheet for Healthcare Providers: SeriousBroker.it  This test is not yet approved or cleared by the [place] FDA and has been authorized for detection and/or diagnosis of SARS-CoV-2 by FDA under an Emergency Use Authorization (EUA). This EUA will remain in effect (meaning this test can be used) for the duration of the COVID-19 declaration under Section 564(b)(1) of the Act, 21 U.S.C. section 360bbb-3(b)(1), unless the authorization is terminated or revoked.     Resp Syncytial Virus by PCR NEGATIVE NEGATIVE Final    Comment: (NOTE) Fact Sheet for Patients: BloggerCourse.com  Fact Sheet for Healthcare Providers: SeriousBroker.it  This test is not yet approved or cleared by the [place] and  has been authorized for detection and/or diagnosis of SARS-CoV-2 by FDA under an Emergency Use Authorization (EUA). This EUA will remain in effect (meaning this test can be used) for the duration of the COVID-19 declaration under Section 564(b)(1) of the Act, 21 U.S.C. section 360bbb-3(b)(1), unless the authorization is terminated or revoked.  Performed at [facility], [address], [place], [place] [address]   Respiratory (~20 pathogens) panel by PCR     Status: Abnormal   Collection Time: 06/28/23  3:52 PM   Specimen: Nasopharyngeal Swab; Respiratory  Result Value Ref Range Status   Adenovirus NOT DETECTED NOT DETECTED Final   Coronavirus 229E NOT DETECTED NOT DETECTED Final    Comment: (NOTE) The Coronavirus on the Respiratory Panel, DOES NOT test for the novel  Coronavirus (2019 nCoV)    Coronavirus HKU1 NOT DETECTED NOT DETECTED Final   Coronavirus NL63 NOT DETECTED NOT DETECTED Final   Coronavirus OC43 NOT DETECTED NOT DETECTED Final   Metapneumovirus DETECTED (A) NOT DETECTED Final   Rhinovirus / Enterovirus NOT DETECTED NOT DETECTED Final   Influenza A NOT DETECTED NOT DETECTED Final    Influenza B NOT DETECTED NOT DETECTED Final   Parainfluenza Virus 1 NOT DETECTED NOT DETECTED Final   Parainfluenza Virus 2 NOT DETECTED NOT DETECTED Final   Parainfluenza Virus 3 NOT DETECTED NOT DETECTED Final   Parainfluenza Virus 4 NOT DETECTED NOT DETECTED Final   Respiratory Syncytial Virus NOT DETECTED NOT DETECTED Final   Bordetella pertussis NOT DETECTED NOT DETECTED Final   Bordetella Parapertussis NOT DETECTED NOT DETECTED Final   Chlamydophila pneumoniae NOT DETECTED NOT DETECTED Final   Mycoplasma pneumoniae NOT DETECTED NOT DETECTED Final    Comment: Performed at [facility], [address] [place], [place] [address]    Radiology Studies: DG CHEST PORT 1 VIEW Result Date: 07/01/2023 CLINICAL DATA:  83 year old male with shortness of breath and cough. EXAM: PORTABLE CHEST 1 VIEW COMPARISON:  Portable chest 06/30/2023 and earlier. FINDINGS: Portable AP semi upright view at 0549 hours. Stable lung volumes and mediastinal contours. Emphysema and lung scarring demonstrated by CT in 2022. Asymmetric lung size not significantly changed since that time. Stable chronic blunting of the costophrenic angles. Coarse bilateral pulmonary interstitial markings. No pneumothorax or acute pulmonary opacity. No acute osseous abnormality identified. Negative visible bowel gas. IMPRESSION: Chronic lung disease with Emphysema (ICD10-J43.9) and scarring. No acute cardiopulmonary abnormality. Electronically Signed   By: Odessa Fleming M.D.   On: 07/01/2023 08:44   DG CHEST PORT 1 VIEW Result Date: 06/30/2023 CLINICAL DATA:  Shortness of breath. EXAM: PORTABLE CHEST 1 VIEW COMPARISON:  06/29/2023 FINDINGS: The heart size and mediastinal contours are within normal limits. Pulmonary hyperinflation again seen, consistent with COPD. Stable pleural-parenchymal scarring in the left mid and lower lung fields. No evidence of acute superimposed pulmonary infiltrate, or pleural effusion. IMPRESSION: COPD and left  pleural-parenchymal scarring.  No acute findings. Electronically Signed   By: Danae Orleans M.D.   On: 06/30/2023 10:56   Scheduled Meds:  amLODipine  10 mg Oral Daily   arformoterol  15 mcg Nebulization BID   budesonide (PULMICORT) nebulizer solution  0.5 mg Nebulization BID   feeding supplement  237 mL Oral BID BM   finasteride  5 mg Oral Daily   guaiFENesin  1,200 mg Oral BID   heparin  5,000 Units Subcutaneous Q8H   insulin aspart  0-15 Units Subcutaneous TID WC   insulin aspart  0-5 Units Subcutaneous  QHS   insulin aspart  6 Units Subcutaneous TID WC   insulin glargine  10 Units Subcutaneous BID   levalbuterol  0.63 mg Nebulization Q6H   methylPREDNISolone (SOLU-MEDROL) injection  40 mg Intravenous Daily   nicotine  21 mg Transdermal Daily   revefenacin  175 mcg Nebulization Daily   simvastatin  10 mg Oral QHS   sodium chloride flush  3 mL Intravenous Q12H   tamsulosin  0.8 mg Oral QHS   Continuous Infusions:   LOS: 2 days   Marguerita Merles, DO [facility] Available via Epic secure chat 7am-7pm After these hours, please refer to coverage provider listed on [email or web address] 07/01/2023, 8:48 PM

## 2023-07-01 NOTE — Care Management Important Message (Signed)
 Important Message  Patient Details  Name: Jeffrey Randall. MRN: [id number] Date of Birth: 09-Aug-1940   Important Message Given:  Yes - Medicare IM     [name] 07/01/2023, 10:19 AM

## 2023-07-01 NOTE — Evaluation (Signed)
 Occupational Therapy Evaluation Patient Details Name: Jeffrey Randall. MRN: [id number] DOB: September 11, 1940 Today's Date: 07/01/2023   History of Present Illness   Pt is an 83 year old man admitted on 06/28/23 with acute respiratory failure with hypoxia due to Metapneumonia and COPD exacerbation. PMH: HTN, DM, COPD, lung ca s/p resection and radiation, alcoholism in remission, CKD 3A, gout, HLD, BPH. Pt is a current smoker.     Clinical Impressions Pt lives alone in a split level home. His son works remotely and is in the pt's home daily and nearby at night. His children support pt with meals and help with heavy work. Pt is typically independent and drives. Pt presents with decreased activity tolerance. Mobility limited by pt being on HHFNC (40L O2, 30% FiO2). Stood with CGA, transferred and marched in place with RW. Pt's sats remained at 100%, RR to 29. Pt requires set up to min assist for ADLs. Recommending HHOT upon discharge, depending on progress. Will follow acutely.     If plan is discharge home, recommend the following:   A little help with walking and/or transfers;A little help with bathing/dressing/bathroom;Assistance with cooking/housework;Assist for transportation;Help with stairs or ramp for entrance     Functional Status Assessment   Patient has had a recent decline in their functional status and demonstrates the ability to make significant improvements in function in a reasonable and predictable amount of time.     Equipment Recommendations   Tub/shower seat     Recommendations for Other Services         Precautions/Restrictions   Precautions Precautions: Fall Precaution/Restrictions Comments: HHFNC Restrictions Weight Bearing Restrictions Per Provider Order: No     Mobility Bed Mobility Overal bed mobility: Modified Independent                  Transfers Overall transfer level: Needs assistance Equipment used: Rolling walker (2  wheels) Transfers: Sit to/from Stand Sit to Stand: Contact guard assist                  Balance Overall balance assessment: Needs assistance   Sitting balance-Leahy Scale: Good       Standing balance-Leahy Scale: Fair Standing balance comment: used walker for stability as pt has not been on his feet since admission                           ADL either performed or assessed with clinical judgement   ADL Overall ADL's : Needs assistance/impaired Eating/Feeding: Independent;Bed level   Grooming: Set up;Sitting   Upper Body Bathing: Minimal assistance;Sitting   Lower Body Bathing: Minimal assistance;Sit to/from stand   Upper Body Dressing : Set up;Sitting   Lower Body Dressing: Minimal assistance;Sit to/from stand   Toilet Transfer: Contact guard assist;Stand-pivot;BSC/3in1;Rolling walker (2 wheels)           Functional mobility during ADLs: Contact guard assist;Rolling walker (2 wheels) General ADL Comments: limited to standing marching and stand pivot transfers due to [facility]     Vision Ability to See in Adequate Light: 0 Adequate Patient Visual Report: No change from baseline       Perception         Praxis         Pertinent Vitals/Pain Pain Assessment Pain Assessment: No/denies pain     Extremity/Trunk Assessment Upper Extremity Assessment Upper Extremity Assessment: Overall WFL for tasks assessed   Lower Extremity Assessment Lower Extremity Assessment: Defer to PT  evaluation   Cervical / Trunk Assessment Cervical / Trunk Assessment: Normal   Communication Communication Communication: No apparent difficulties   Cognition Arousal: Alert Behavior During Therapy: WFL for tasks assessed/performed Cognition: No apparent impairments             OT - Cognition Comments: does not appear to grasp the gravity of his current illness                 Following commands: Intact       Cueing  General Comments       Dr  Marland Mcalpine authorizing PT and OT to work with pt without VQ scan completed.   Exercises     Shoulder Instructions      Home Living Family/patient expects to be discharged to:: Private residence Living Arrangements: Alone (son works from pt's home during the day) Available Help at Discharge: Family;Available 24 hours/day Type of Home: House Home Access: Stairs to enter [employer] of Steps: 6 Entrance Stairs-Rails: Right;Left Home Layout: Multi-level Alternate Level Stairs-Number of Steps: 6-8 steps between levels   Bathroom Shower/Tub: Producer, television/film/video: Handicapped height     Home Equipment: None          Prior Functioning/Environment Prior Level of Function : Independent/Modified Independent;Driving             Mobility Comments: walks without AD ADLs Comments: stands to shower, children help with meals and heavy work    OT Problem List: Decreased activity tolerance;Impaired balance (sitting and/or standing);Cardiopulmonary status limiting activity   OT Treatment/Interventions: Self-care/ADL training;Energy conservation;DME and/or AE instruction;Patient/family education;Therapeutic activities      OT Goals(Current goals can be found in the care plan section)   Acute Rehab OT Goals OT Goal Formulation: With patient Time For Goal Achievement: 07/15/23 Potential to Achieve Goals: Fair ADL Goals Pt Will Perform Grooming: with supervision;standing Pt Will Perform Upper Body Bathing: with set-up;sitting Pt Will Perform Lower Body Bathing: sit to/from stand;with supervision Pt Will Perform Lower Body Dressing: sit to/from stand;with supervision Pt Will Transfer to Toilet: with supervision;ambulating Pt Will Perform Toileting - Clothing Manipulation and hygiene: with supervision;sit to/from stand Additional ADL Goal #1: Pt will employ energy conservation and pursed lip breathing strategies during exertion.   OT Frequency:  Min 2X/week     Co-evaluation              AM-PAC OT "6 Clicks" Daily Activity     Outcome Measure Help from another person eating meals?: None Help from another person taking care of personal grooming?: A Little Help from another person toileting, which includes using toliet, bedpan, or urinal?: A Little Help from another person bathing (including washing, rinsing, drying)?: A Little Help from another person to put on and taking off regular upper body clothing?: A Little Help from another person to put on and taking off regular lower body clothing?: A Little 6 Click Score: 19   End of Session Equipment Utilized During Treatment: Rolling walker (2 wheels);Oxygen Nurse Communication: Mobility status  Activity Tolerance: Patient limited by fatigue Patient left: in bed;with call bell/phone within reach  OT Visit Diagnosis: Unsteadiness on feet (R26.81);Other (comment);Muscle weakness (generalized) (M62.81) (decreased activity tolerance)                Time: 4098-1191 OT Time Calculation (min): 39 min Charges:  OT General Charges $OT Visit: 1 Visit OT Evaluation $OT Eval Moderate Complexity: 1 Mod OT Treatments $Self Care/Home Management : 8-22 mins $Therapeutic Activity:  8-22 mins  Berna Spare, OTR/L Acute Rehabilitation Services Office: [phone]   Evern Bio 07/01/2023, 4:46 PM

## 2023-07-01 NOTE — Telephone Encounter (Signed)
 I called and spoke with son.  He is already being seen by our inpatient consult service.  Nothing further needed.

## 2023-07-01 NOTE — Plan of Care (Signed)

## 2023-07-01 NOTE — Progress Notes (Signed)
 NAME:  Jeffrey Kitchings., MRN:  [id number], DOB:  07-23-1940, LOS: 2 ADMISSION DATE:  06/28/2023, CONSULTATION DATE:  06/30/23 REFERRING MD:  Marland Mcalpine, CHIEF COMPLAINT:  SOB   History of Present Illness:  83 yo M PMH COPD, Lung ca s/p radiation, ongoing tobacco use, CKD 3a, HTN, DM who was admitted to [facility] 06/28/23 after presenting to ED w CC progressive SOB. Associated productive coug, worse DOE with only short distances. Minimal improvement w nebs.   CXR 3/18 w L basilar opacity and irregular density in r apex. A CT chest was recommended.  RVP revealed + metapneumovirus  Was started on steroids and breo + incuse in place of home trelegy + [facility] atrovent VQ scan ordered.   Pertinent  Medical History   Past Medical History:  Diagnosis Date   Alcoholism in recovery [facility])    Aortic atherosclerosis (HCC)    Back pain 06/17/2014   Chronic lung disease    CKD (chronic kidney disease)    COPD (chronic obstructive pulmonary disease) (HCC) 07/23/2014   Essential hypertension    Gout    History of lung cancer    History of pancreatitis    Iron deficiency anemia    Lumbar disc disease    Mixed hyperlipidemia    Notalgia    Prostate hypertrophy    Pulmonary nodules 07/23/2014   Swelling    Tobacco use disorder 07/23/2014   Tumor of lung 01/26/2021   Type 2 diabetes mellitus without complication (HCC)    Uncontrolled type 2 diabetes mellitus with hyperglycemia, without long-term current use of insulin (HCC)      Significant Hospital Events: Including procedures, antibiotic start and stop dates in addition to other pertinent events   3/18 presented with SOB, admitted for AECOPD with metapneumovirus  3/19 BD changed to brovana budesonide atrovent PRN albuterol  3/20 PCCM is consulted for dyspnea. SpO2 98-100 on 1.5-2.5L   Interim History / Subjective:   He reports feeling better today but continues to feel very short of breath He has productive cough and has wheezing  Objective   Blood  pressure (!) 155/57, pulse 84, temperature 97.9 F (36.6 C), temperature source Oral, resp. rate (!) 21, height 5\' 10"  (1.778 m), weight 64.3 kg, SpO2 100%.    Vent Mode: PSV;BIPAP FiO2 (%):  [30 %-40 %] 30 % PEEP:  [5 cmH20] 5 cmH20 Pressure Support:  [7 cmH20] 7 cmH20   Intake/Output Summary (Last 24 hours) at 07/01/2023 0846 Last data filed at 07/01/2023 0700 Gross per 24 hour  Intake --  Output 1100 ml  Net -1100 ml   Filed Weights   06/28/23 1219  Weight: 64.3 kg    Examination: General: Acute on chronically ill appearing elderly male lying in bed, in NAD HEENT: [place]/AT, MM pink/moist, PERRL,  Neuro: Alert and oriented x3, non-focal  CV: s1s2 regular rate and rhythm, no murmur, rubs, or gallops,  PULM:  diffuse wheezing,  GI: soft, bowel sounds active in all 4 quadrants, non-tender, non-distended, tolerating Oral diet  Extremities: warm/dry, no edema  Skin: no rashes or lesions  Resolved Hospital Problem list     Assessment & Plan:  Acute hypoxic respiratory failure in the setting of Metapneumovirus and acute exacerbation of COPD -On Trelegy and Butisol at baseline  Current daily tobacco use History of stage 1A lung cancer presumed NSCLC in the right upper lobe with concern for post radiation fibrosis  -Treated with radiation in 2022 -Repeat PET scan 12/22/22 per CareEverywhere with  no signs of recurrent cancer but signs of radiation fibrosis within the right lung apex and mid lung  P: Supplemental oxygen for sat goal > 90 Continue Brovana, Pulmicort and yupelri PRN albuterol IV solumedrol 40mg  BID As needed cough suppressants  Cessation education when appropriate Encourage pulmonary hygiene  Mobilize as able   Pending VQ scan   Best Practice (right click and "Reselect all SmartList Selections" daily)  Per primary   Labs   CBC: Recent Labs  Lab 06/28/23 1335 06/29/23 1019 06/30/23 0814 07/01/23 0332  WBC 9.7 7.7 15.9* 15.0*  NEUTROABS 8.6* 7.3 15.1*  14.1*  HGB 11.0* 10.1* 10.1* 9.7*  HCT 33.6* 31.0* 30.1* 28.9*  MCV 97.7 96.6 95.9 93.5  PLT 241 245 260 268    Basic Metabolic Panel: Recent Labs  Lab 06/28/23 1335 06/29/23 1019 06/30/23 0814 07/01/23 0332  NA 139 135 135 138  K 4.4 4.2 4.0 4.5  CL 111 108 110 111  CO2 18* 17* 17* 18*  GLUCOSE 283* 541* 318* 196*  BUN 44* 46* 57* 67*  CREATININE 2.70* 2.57* 2.63* 2.75*  CALCIUM 8.8* 8.4* 8.7* 8.7*  MG  --  2.2 2.2 2.2  PHOS  --  4.6 4.5 4.5   GFR: Estimated Creatinine Clearance: 18.5 mL/min (A) (by C-G formula based on SCr of 2.75 mg/dL (H)). Recent Labs  Lab 06/28/23 1335 06/29/23 1019 06/30/23 0814 07/01/23 0332  WBC 9.7 7.7 15.9* 15.0*    Liver Function Tests: Recent Labs  Lab 06/29/23 1019 06/30/23 0814 07/01/23 0332  AST 22 17 23   ALT 30 26 25   ALKPHOS 113 123 102  BILITOT 0.2 0.2 0.3  PROT 5.6* 5.7* 5.8*  ALBUMIN 2.1* 2.1* 2.1*   No results for input(s): "LIPASE", "AMYLASE" in the last 168 hours. No results for input(s): "AMMONIA" in the last 168 hours.  ABG No results found for: "PHART", "PCO2ART", "PO2ART", "HCO3", "TCO2", "ACIDBASEDEF", "O2SAT"   Coagulation Profile: No results for input(s): "INR", "PROTIME" in the last 168 hours.  Cardiac Enzymes: No results for input(s): "CKTOTAL", "CKMB", "CKMBINDEX", "TROPONINI" in the last 168 hours.  HbA1C: Hgb A1c MFr Bld  Date/Time Value Ref Range Status  06/29/2023 10:19 AM 9.9 (H) 4.8 - 5.6 % Final    Comment:    (NOTE) Pre diabetes:          5.7%-6.4%  Diabetes:              >6.4%  Glycemic control for   <7.0% adults with diabetes   06/18/2014 06:38 AM 8.7 (H) 4.8 - 5.6 % Final    Comment:    (NOTE)         Pre-diabetes: 5.7 - 6.4         Diabetes: >6.4         Glycemic control for adults with diabetes: <7.0     CBG: Recent Labs  Lab 06/30/23 1646 06/30/23 1943 06/30/23 2334 07/01/23 0409 07/01/23 0759  GLUCAP 236* 262* 297* 190* 257*       Critical care time:  NA    Melody Comas, MD [facility] Office: [phone]   See Amion for personal pager PCCM on call pager [phone] until 7pm. Please call Elink 7p-7a. [phone]

## 2023-07-02 ENCOUNTER — Inpatient Hospital Stay (HOSPITAL_COMMUNITY)

## 2023-07-02 DIAGNOSIS — N400 Enlarged prostate without lower urinary tract symptoms: Secondary | ICD-10-CM | POA: Diagnosis not present

## 2023-07-02 DIAGNOSIS — N179 Acute kidney failure, unspecified: Secondary | ICD-10-CM | POA: Diagnosis not present

## 2023-07-02 DIAGNOSIS — R0609 Other forms of dyspnea: Secondary | ICD-10-CM

## 2023-07-02 DIAGNOSIS — I10 Essential (primary) hypertension: Secondary | ICD-10-CM | POA: Diagnosis not present

## 2023-07-02 DIAGNOSIS — J9601 Acute respiratory failure with hypoxia: Secondary | ICD-10-CM | POA: Diagnosis not present

## 2023-07-02 LAB — CBC WITH DIFFERENTIAL/PLATELET
Abs Immature Granulocytes: 0.07 10*3/uL (ref 0.00–0.07)
Abs Immature Granulocytes: 0.07 10*3/uL (ref 0.00–0.07)
Basophils Absolute: 0 10*3/uL (ref 0.0–0.1)
Basophils Absolute: 0 10*3/uL (ref 0.0–0.1)
Basophils Relative: 0 %
Basophils Relative: 0 %
Eosinophils Absolute: 0 10*3/uL (ref 0.0–0.5)
Eosinophils Absolute: 0 10*3/uL (ref 0.0–0.5)
Eosinophils Relative: 0 %
Eosinophils Relative: 0 %
HCT: 29.8 % — ABNORMAL LOW (ref 39.0–52.0)
HCT: 31.1 % — ABNORMAL LOW (ref 39.0–52.0)
Hemoglobin: 10 g/dL — ABNORMAL LOW (ref 13.0–17.0)
Hemoglobin: 10.1 g/dL — ABNORMAL LOW (ref 13.0–17.0)
Immature Granulocytes: 1 %
Immature Granulocytes: 1 %
Lymphocytes Relative: 2 %
Lymphocytes Relative: 2 %
Lymphs Abs: 0.2 10*3/uL — ABNORMAL LOW (ref 0.7–4.0)
Lymphs Abs: 0.3 10*3/uL — ABNORMAL LOW (ref 0.7–4.0)
MCH: 31.7 pg (ref 26.0–34.0)
MCH: 31.5 pg (ref 26.0–34.0)
MCHC: 33.6 g/dL (ref 30.0–36.0)
MCHC: 32.5 g/dL (ref 30.0–36.0)
MCV: 94.6 fL (ref 80.0–100.0)
MCV: 96.9 fL (ref 80.0–100.0)
Monocytes Absolute: 0.2 10*3/uL (ref 0.1–1.0)
Monocytes Absolute: 0.2 10*3/uL (ref 0.1–1.0)
Monocytes Relative: 2 %
Monocytes Relative: 2 %
Neutro Abs: 12.6 10*3/uL — ABNORMAL HIGH (ref 1.7–7.7)
Neutro Abs: 12.5 10*3/uL — ABNORMAL HIGH (ref 1.7–7.7)
Neutrophils Relative %: 95 %
Neutrophils Relative %: 95 %
Platelets: 259 10*3/uL (ref 150–400)
Platelets: 289 10*3/uL (ref 150–400)
RBC: 3.15 MIL/uL — ABNORMAL LOW (ref 4.22–5.81)
RBC: 3.21 MIL/uL — ABNORMAL LOW (ref 4.22–5.81)
RDW: 15.6 % — ABNORMAL HIGH (ref 11.5–15.5)
RDW: 15.8 % — ABNORMAL HIGH (ref 11.5–15.5)
WBC: 13.2 10*3/uL — ABNORMAL HIGH (ref 4.0–10.5)
WBC: 13.1 10*3/uL — ABNORMAL HIGH (ref 4.0–10.5)
nRBC: 0 % (ref 0.0–0.2)
nRBC: 0 % (ref 0.0–0.2)

## 2023-07-02 LAB — ECHOCARDIOGRAM COMPLETE
AR max vel: 2.51 cm2
AV Peak grad: 10.1 mmHg
Ao pk vel: 1.59 m/s
Area-P 1/2: 3.17 cm2
Est EF: >75
Height: 70 in
S' Lateral: 2.2 cm
Weight: 2268.09 [oz_av]

## 2023-07-02 LAB — COMPREHENSIVE METABOLIC PANEL
ALT: 30 U/L (ref 0–44)
AST: 26 U/L (ref 15–41)
Albumin: 2.1 g/dL — ABNORMAL LOW (ref 3.5–5.0)
Alkaline Phosphatase: 101 U/L (ref 38–126)
Anion gap: 6 (ref 5–15)
BUN: 74 mg/dL — ABNORMAL HIGH (ref 8–23)
CO2: 17 mmol/L — ABNORMAL LOW (ref 22–32)
Calcium: 8.1 mg/dL — ABNORMAL LOW (ref 8.9–10.3)
Chloride: 115 mmol/L — ABNORMAL HIGH (ref 98–111)
Creatinine, Ser: 2.77 mg/dL — ABNORMAL HIGH (ref 0.61–1.24)
GFR, Estimated: 22 mL/min — ABNORMAL LOW (ref >60)
Glucose, Bld: 135 mg/dL — ABNORMAL HIGH (ref 70–99)
Potassium: 4.5 mmol/L (ref 3.5–5.1)
Sodium: 138 mmol/L (ref 135–145)
Total Bilirubin: 0.5 mg/dL (ref 0.0–1.2)
Total Protein: 5.5 g/dL — ABNORMAL LOW (ref 6.5–8.1)

## 2023-07-02 LAB — BLOOD GAS, ARTERIAL
Acid-base deficit: 8.6 mmol/L — ABNORMAL HIGH (ref 0.0–2.0)
Bicarbonate: 17.4 mmol/L — ABNORMAL LOW (ref 20.0–28.0)
O2 Saturation: 100 %
Patient temperature: 37
pCO2 arterial: 37 mmHg (ref 32–48)
pH, Arterial: 7.28 — ABNORMAL LOW (ref 7.35–7.45)
pO2, Arterial: 133 mmHg — ABNORMAL HIGH (ref 83–108)

## 2023-07-02 LAB — MAGNESIUM: Magnesium: 2.1 mg/dL (ref 1.7–2.4)

## 2023-07-02 LAB — GLUCOSE, CAPILLARY
Glucose-Capillary: 131 mg/dL — ABNORMAL HIGH (ref 70–99)
Glucose-Capillary: 168 mg/dL — ABNORMAL HIGH (ref 70–99)
Glucose-Capillary: 255 mg/dL — ABNORMAL HIGH (ref 70–99)
Glucose-Capillary: 362 mg/dL — ABNORMAL HIGH (ref 70–99)
Glucose-Capillary: 290 mg/dL — ABNORMAL HIGH (ref 70–99)

## 2023-07-02 LAB — TROPONIN I (HIGH SENSITIVITY)
Troponin I (High Sensitivity): 22 ng/L — ABNORMAL HIGH (ref <18)
Troponin I (High Sensitivity): 24 ng/L — ABNORMAL HIGH (ref <18)

## 2023-07-02 LAB — BRAIN NATRIURETIC PEPTIDE: B Natriuretic Peptide: 190.9 pg/mL — ABNORMAL HIGH (ref 0.0–100.0)

## 2023-07-02 LAB — PHOSPHORUS: Phosphorus: 4.5 mg/dL (ref 2.5–4.6)

## 2023-07-02 MED ORDER — SODIUM CHLORIDE 0.9 % IV SOLN: 1.0000 g | INTRAVENOUS | Status: DC

## 2023-07-02 MED ORDER — AZITHROMYCIN 500 MG IV SOLR
500.0000 mg | INTRAVENOUS | Status: AC
  Administered 2023-07-02 – 2023-07-06 (×5): 500 mg via INTRAVENOUS
  Filled 2023-07-02 (×5): qty 5

## 2023-07-02 MED ORDER — SODIUM CHLORIDE 0.9 % IV SOLN
2.0000 g | INTRAVENOUS | Status: AC
  Administered 2023-07-02 – 2023-07-06 (×5): 2 g via INTRAVENOUS
  Filled 2023-07-02 (×5): qty 20

## 2023-07-02 MED ORDER — SODIUM BICARBONATE 650 MG PO TABS
650.0000 mg | ORAL_TABLET | Freq: Two times a day (BID) | ORAL | Status: DC
  Administered 2023-07-02 – 2023-07-07 (×11): 650 mg via ORAL
  Filled 2023-07-02 (×11): qty 1

## 2023-07-02 MED ORDER — FUROSEMIDE 10 MG/ML IJ SOLN
40.0000 mg | Freq: Once | INTRAMUSCULAR | Status: AC
  Administered 2023-07-02: 40 mg via INTRAVENOUS
  Filled 2023-07-02: qty 4

## 2023-07-02 NOTE — Progress Notes (Signed)
 Physical Therapy Evaluation Patient Details Name: Jeffrey Randall. MRN: [id number] DOB: 1940-04-19 Today's Date: 07/02/2023  History of Present Illness  Pt is an 83 year old man admitted on 06/28/23 with acute respiratory failure with hypoxia due to Metapneumonia and COPD exacerbation. PMH: HTN, DM, COPD, lung ca s/p resection and radiation, alcoholism in remission, CKD 3A, gout, HLD, BPH. Pt is a current smoker.   Clinical Impression  Pt in bed upon arrival with daughter present and agreeable to PT eval. PTA, pt was independent with no AD and very active. In today's session, pt was ModI for bed mobility and was able to perform multiple stands with CGA and no AD. Pt was fatigued and did not want to ambulate. Pt was able to march in place and step forwards/backwards with CGA and no AD. Pt currently with functional limitations due to the deficits listed below (see PT Problem List). Pt would benefit from acute skilled PT to address functional impairments. Pt will have 24/7 assist available upon d/c from the hospital. Recommending post-acute HHPT to work on activity tolerance, however, anticipate pt may have no post-acute needs depending on progress with mobility. Acute PT to follow.  HHFNC on 40L, 34 FiO2, >100% SpO2 throughout session         If plan is discharge home, recommend the following: A little help with walking and/or transfers;Assist for transportation;Help with stairs or ramp for entrance   Can travel by private vehicle    Yes    Equipment Recommendations  (TBD)     Functional Status Assessment Patient has had a recent decline in their functional status and/or demonstrates limited ability to make significant improvements in function in a reasonable and predictable amount of time     Precautions / Restrictions Precautions Precautions: Fall Precaution/Restrictions Comments: H[facility]NC Restrictions Weight Bearing Restrictions Per Provider Order: No      Mobility  Bed  Mobility Overal bed mobility: Modified Independent   Transfers Overall transfer level: Needs assistance Equipment used: Rolling walker (2 wheels) Transfers: Sit to/from Stand Sit to Stand: Contact guard assist    General transfer comment: CGA with no AD, able to march in place and take forwards/backwards steps    Ambulation/Gait    General Gait Details: pt declined 2/2 fatigue       Balance Overall balance assessment: Needs assistance Sitting-balance support: No upper extremity supported, Feet supported Sitting balance-Leahy Scale: Good     Standing balance support: No upper extremity supported Standing balance-Leahy Scale: Fair Standing balance comment: able to stand with no AD         Pertinent Vitals/Pain Pain Assessment Pain Assessment: No/denies pain    Home Living Family/patient expects to be discharged to:: Private residence Living Arrangements: Alone (son works from [employer] home during the day) Available Help at Discharge: Family;Available 24 hours/day Type of Home: House Home Access: Stairs to enter Entrance Stairs-Rails: Doctor, general practice of Steps: 6 Alternate Level Stairs-Number of Steps: 6-8 steps between levels (left hand rails) Home Layout: Multi-level Home Equipment: None      Prior Function Prior Level of Function : Independent/Modified Independent;Driving    Mobility Comments: walks without AD ADLs Comments: stands to shower, children help with meals and heavy work     Extremity/Trunk Assessment   Upper Extremity Assessment Upper Extremity Assessment: Defer to OT evaluation    Lower Extremity Assessment Lower Extremity Assessment: Overall WFL for tasks assessed    Cervical / Trunk Assessment Cervical / Trunk Assessment: Normal  Communication  Communication Communication: No apparent difficulties    Cognition Arousal: Alert Behavior During Therapy: WFL for tasks assessed/performed   PT - Cognitive impairments: No  apparent impairments    Following commands: Intact       Cueing Cueing Techniques: Verbal cues, Tactile cues     General Comments General comments (skin integrity, edema, etc.): daughter present during session and supportive. Encouraged frequent mobility and getting up to the recliner at least once a day     PT Assessment Patient needs continued PT services  PT Problem List Decreased activity tolerance;Decreased balance;Decreased mobility;Cardiopulmonary status limiting activity       PT Treatment Interventions DME instruction;Gait training;Stair training;Therapeutic activities;Functional mobility training;Therapeutic exercise;Balance training;Neuromuscular re-education;Patient/family education    PT Goals (Current goals can be found in the Care Plan section)  Acute Rehab PT Goals Patient Stated Goal: to get better PT Goal Formulation: With patient/family Time For Goal Achievement: 07/16/23 Potential to Achieve Goals: Good    Frequency Min 2X/week        AM-PAC PT "6 Clicks" Mobility  Outcome Measure Help needed turning from your back to your side while in a flat bed without using bedrails?: None Help needed moving from lying on your back to sitting on the side of a flat bed without using bedrails?: None Help needed moving to and from a bed to a chair (including a wheelchair)?: A Little Help needed standing up from a chair using your arms (e.g., wheelchair or bedside chair)?: A Little Help needed to walk in hospital room?: A Little Help needed climbing 3-5 steps with a railing? : A Lot 6 Click Score: 19    End of Session Equipment Utilized During Treatment: Oxygen Activity Tolerance: Patient tolerated treatment well Patient left: in bed;with call bell/phone within reach;with family/visitor present Nurse Communication: Mobility status PT Visit Diagnosis: Other abnormalities of gait and mobility (R26.89)    Time: 4098-1191 PT Time Calculation (min) (ACUTE ONLY): 25  min   Charges:   PT Evaluation $PT Eval Low Complexity: 1 Low   PT General Charges $$ ACUTE PT VISIT: 1 Visit        Hilton Cork, PT, DPT Secure Chat Preferred  Rehab Office [phone]   Arturo Morton Brion Aliment 07/02/2023, 4:47 PM

## 2023-07-02 NOTE — Progress Notes (Signed)
 PROGRESS NOTE    Jeffrey Randall.  YNW:[id number] DOB: 07/31/1940 DOA: 06/28/2023 PCP: Carin Hock, PA   Brief Narrative:  Jeffrey Maiolo. is a 83 y.o. male with medical history significant of hypertension, diabetes, COPD, lung cancer s/p radiation, alcoholism in remission, CKD 3 A, gout, hyperlipidemia, BPH presenting with worsening shortness of breath. Further workup reveals + Metapneumovirus. Ruling out PE but VQ Scan unable to be done as patient could not lay flat. Nebulized Medications are being further adjusted.    Continued to be dyspneic so pulmonary was consulted and they are adding Yupleri and was transferrred to the  Progressive care unit for possible BiPAP if necessary.  IV morphine and lorazepam were added.  Now on heated high flow nasal cannula and appears more comfortable.  Continues to be dyspneic so medication changes are being made and now is on antibiotic coverage and echocardiogram is being obtained.  Assessment and Plan:  Acute Respiratory failure with hypoxia in the setting of Metapneumovirus with Acute COPD exacerbation Rule out pulmonary embolism -Respiratory panel is + for Metapneumovirus.  No leukocytosis to indicate bacterial etiology.   -Initial CXR did show irregularity/solid density at the right lung apex recommending possible CT to follow-up.  However, patient had consolidation at this location noted on CT in August with read Care Everywhere.  PET scan was done in follow-up considering patient's history of lung cancer which showed consolidation superimposed on the right upper lobe radiation fibrosis favoring infection versus inflammation but no evidence of recurrence of lung cancer or metastatic disease. -ABG done and showed a pH of 7.28, pCO2 37, pO2 133, HCO3 of 17.4, and a O2 saturation of 100% -Continue with systemic steroids and now IV Solu-Medrol been increased back to 40 mg twice daily by Pulmonary -Scheduled Xopenex/Atrovent with PRN  As needed  albuterol and now they are adding revefenacin 175 mcg nebs daily -C/w Afromoterol 15 mcg Neb BID and Budesonide 0.25 mg Daily -VQ scan when able to be done when he is able to lie flat in the interim we will check Echocardiogram and lower extremity venous duplex.  -CXR this AM showed cardiomegaly and increased creasing interstitial edema which was compatible with CHF and as well as asymmetric left-sided airspace disease that may represent asymmetric edema or infection.  Given these findings will give him dose of IV Lasix 40 mg x 1 but he does not appear to overtly volume overloaded -Will start the patient on empiric antibiotics with IV ceftriaxone and azithromycin to treat for superimposed bacterial infection for 5 Days -Appreciate further Pulmonary Evaluation and recommendations and he had been transition to heated high flow nasal cannula 40 L oxygen with 30% FiO2 yesterday but this morning he was on 3 L and stopping so he was placed back on the 40 L.  PT/OT recommending home health at discharge.  Chest pain and discomfort: Happened when he started having worsening breathing.  BNP was 190.9 check troponins x 2 and went from 22 and is now 24.  EKG done showed a normal sinus rhythm with a rate of 70 and a QTc of 442.  Echocardiogram done and showed LVEF of >75% w/ no RWMA, and Normal LVDP. RVSF was normal. [place] has resolved.   AKI on CKD 3A / Metabolic Acidosis:  Creatinine elevated to 2.7 from previous baseline of 1.4 (though this was 2 years ago) Did reportedly have some recent decreased p.o. intake. Received 1 L in the ED. BUN/Cr Trend: Recent Labs  Lab  06/28/23 1335 06/29/23 1019 06/30/23 0814 07/01/23 0332 07/02/23 0259  BUN 44* 46* 57* 67* 74*  CREATININE 2.70* 2.57* 2.63* 2.75* 2.77*  -IVF now discontinued; this appears to be likely his new baseline; Give IV Lasix 40 mg x1 as above -Since the patient continues have a slight metabolic acidosis he has a CO2 17, chloride level of 115,  anion gap of 6.  Start Sodium Bicarbonate 650 mg po BID -Avoid Nephrotoxic Medications, Contrast Dyes, Hypotension and Dehydration to Ensure Adequate Renal Perfusion and will need to Renally Adjust Meds. CTM and Trend Renal Function carefully and repeat CMP in the AM    Leukocytosis: Setting of steroid demargination.  Patient's WBC is now trending back down and gone from 15.9 and is 13.2.  Continue to monitor for signs or symptoms of infection.  Repeat CBC showed mild improvement today at 15.0.  Continue to monitor for signs and symptoms of infection  History of Lung Cancer: Status post radiation treatment in 2022.  As above had CT with abnormality last year and follow-up PET scan showed no recurrence of disease. Will need outpt f/u with his cancer doctors.  Pulmonary consulted as above   Essential HTN: C/w Amlodipine 10 mg po Daily.  Hold Valsartan/Hydrochlorothiazide in the setting of AKI. CTM BP per Protocol. Last BP reading was 147/79   Diabetes Mellitus Type 2: Home medication is 70/30 insulin SSI TID. C/w Moderate Novolog SSI AC. CTM for Hyperglycemia with steroids for COPD. Adjust Insulin regimen as necessary. CBGs significantly elevated. Add Lantus 10 units Daily and increased to BID. Start Moderate Novolog SSI q4h and Novolog Meal Coverage 6 units TID if eats 50% or Greater.  CBGs ranging from 168-362   Gout: Holding Allopurinol 100 mg po Daily in the setting of AKI   BPH: C/w Finasteride 5 mg po Daily and Tamsulosin 0.8 mg po qHS  Normocytic Anemia: Hgb/Hct stable around 10 and is now 10.0/29.8.  Anemia panel showed an iron level of 35, UIBC 189, TIBC 224, saturation ratios of 16%, ferritin of 147, folate level 8.6 and vitamin B12 1449 CTM for S/Sx of Bleeding; No overt bleeding noted. Repeat CBC in the AM   Hyperlipidemia: C/w Simvastatin 10 mg po Daily   Hypoalbuminemia: Albumin Level stable at 2.1. CTM and Trend and repeat CMP in the AM  DVT prophylaxis: heparin injection 5,000  Units Start: 06/28/23 1530    Code Status: Full Code Family Communication: Family present at beside  Disposition Plan:  Level of care: Progressive Status is: Inpatient Remains inpatient appropriate because: Needs further clinical improvement in his respiratory status   Consultants:  Pulmonary  Procedures:  As delineated above; ECHOcardiogram  Antimicrobials:  Anti-infectives (From admission, onward)    Start     Dose/Rate Route Frequency Ordered Stop   07/02/23 1500  cefTRIAXone (ROCEPHIN) 2 g in sodium chloride 0.9 % 100 mL IVPB        2 g 200 mL/hr over 30 Minutes Intravenous Every 24 hours 07/02/23 1403     07/02/23 1445  azithromycin (ZITHROMAX) 500 mg in sodium chloride 0.9 % 250 mL IVPB        500 mg 250 mL/hr over 60 Minutes Intravenous Every 24 hours 07/02/23 1349     07/02/23 1445  cefTRIAXone (ROCEPHIN) 1 g in sodium chloride 0.9 % 100 mL IVPB  Status:  Discontinued        1 g 200 mL/hr over 30 Minutes Intravenous Every 24 hours 07/02/23 1349 07/02/23 1403  Subjective: Seen and examined at bedside and states that he was dyspneic this morning and had some chest discomfort but is doing better now once he has been transitioned back to the heated high flow.  No nausea or vomiting.  Denies any lightheadedness and states he feels about the same as yesterday.  Objective: Vitals:   07/02/23 1113 07/02/23 1441 07/02/23 1501 07/02/23 1708  BP: (!) 141/55  (!) 155/63 (!) 147/79  Pulse: 75 75 79 84  Resp: 19 (!) 25 (!) 25 20  Temp: 98 F (36.7 C)  97.7 F (36.5 C) 98.7 F (37.1 C)  TempSrc: Oral  Axillary Oral  SpO2: 100% 100% 100%   Weight:      Height:        Intake/Output Summary (Last 24 hours) at 07/02/2023 1724 Last data filed at 07/02/2023 1500 Gross per 24 hour  Intake --  Output 300 ml  Net -300 ml   Filed Weights   06/28/23 1219  Weight: 64.3 kg   Examination: Physical Exam:  Constitutional: Thin elderly African-American male who appears in  some respiratory distress and a little uncomfortable Respiratory: Diminished to auscultation bilaterally with slight wheezing and does have some coarse breath sounds with some rhonchi and some slight crackles.  No appreciable rales.  Slightly elevated respiratory effort and patient mildly tachypenic. No accessory muscle use.  Wearing heated high flow nasal cannula  Cardiovascular: RRR, no murmurs / rubs / gallops. S1 and S2 auscultated.  No appreciable extremity edema Abdomen: Soft, non-tender, nondistended. Bowel sounds positive.  GU: Deferred. Musculoskeletal: No clubbing / cyanosis of digits/nails. No joint deformity upper and lower extremities.  Skin: No rashes, lesions, ulcers on a limited skin evaluation. No induration; Warm and dry.  Neurologic: CN 2-12 grossly intact with no focal deficits. Romberg sign and cerebellar reflexes not assessed.  Psychiatric: Normal judgment and insight. Alert and oriented x 3. Normal mood and appropriate affect.   Data Reviewed: I have personally reviewed following labs and imaging studies  CBC: Recent Labs  Lab 06/28/23 1335 06/29/23 1019 06/30/23 0814 07/01/23 0332 07/02/23 0259  WBC 9.7 7.7 15.9* 15.0* 13.2*  NEUTROABS 8.6* 7.3 15.1* 14.1* 12.6*  HGB 11.0* 10.1* 10.1* 9.7* 10.0*  HCT 33.6* 31.0* 30.1* 28.9* 29.8*  MCV 97.7 96.6 95.9 93.5 94.6  PLT 241 245 260 268 259   Basic Metabolic Panel: Recent Labs  Lab 06/28/23 1335 06/29/23 1019 06/30/23 0814 07/01/23 0332 07/02/23 0259  NA 139 135 135 138 138  K 4.4 4.2 4.0 4.5 4.5  CL 111 108 110 111 115*  CO2 18* 17* 17* 18* 17*  GLUCOSE 283* 541* 318* 196* 135*  BUN 44* 46* 57* 67* 74*  CREATININE 2.70* 2.57* 2.63* 2.75* 2.77*  CALCIUM 8.8* 8.4* 8.7* 8.7* 8.1*  MG  --  2.2 2.2 2.2 2.1  PHOS  --  4.6 4.5 4.5 4.5   GFR: Estimated Creatinine Clearance: 18.4 mL/min (A) (by C-G formula based on SCr of 2.77 mg/dL (H)). Liver Function Tests: Recent Labs  Lab 06/29/23 1019 06/30/23 0814  07/01/23 0332 07/02/23 0259  AST 22 17 23 26   ALT 30 26 25 30   ALKPHOS 113 123 102 101  BILITOT 0.2 0.2 0.3 0.5  PROT 5.6* 5.7* 5.8* 5.5*  ALBUMIN 2.1* 2.1* 2.1* 2.1*   No results for input(s): "LIPASE", "AMYLASE" in the last 168 hours. No results for input(s): "AMMONIA" in the last 168 hours. Coagulation Profile: No results for input(s): "INR", "PROTIME" in the last  168 hours. Cardiac Enzymes: No results for input(s): "CKTOTAL", "CKMB", "CKMBINDEX", "TROPONINI" in the last 168 hours. BNP (last 3 results) No results for input(s): "PROBNP" in the last 8760 hours. HbA1C: No results for input(s): "HGBA1C" in the last 72 hours. CBG: Recent Labs  Lab 07/01/23 1944 07/02/23 0311 07/02/23 0637 07/02/23 1116 07/02/23 1711  GLUCAP 235* 131* 168* 255* 362*   Lipid Profile: No results for input(s): "CHOL", "HDL", "LDLCALC", "TRIG", "CHOLHDL", "LDLDIRECT" in the last 72 hours. Thyroid Function Tests: No results for input(s): "TSH", "T4TOTAL", "FREET4", "T3FREE", "THYROIDAB" in the last 72 hours. Anemia Panel: Recent Labs    07/01/23 0332  VITAMINB12 1,449*  FOLATE 8.6  FERRITIN 147  TIBC 224*  IRON 35*  RETICCTPCT 1.3   Sepsis Labs: No results for input(s): "PROCALCITON", "LATICACIDVEN" in the last 168 hours.  Recent Results (from the past 240 hours)  Resp panel by RT-PCR (RSV, Flu A&B, Covid) Anterior Nasal Swab     Status: None   Collection Time: 06/28/23 12:27 PM   Specimen: Anterior Nasal Swab  Result Value Ref Range Status   SARS Coronavirus 2 by RT PCR NEGATIVE NEGATIVE Final   Influenza A by PCR NEGATIVE NEGATIVE Final   Influenza B by PCR NEGATIVE NEGATIVE Final    Comment: (NOTE) The Xpert Xpress SARS-CoV-2/FLU/RSV plus assay is intended as an aid in the diagnosis of influenza from Nasopharyngeal swab specimens and should not be used as a sole basis for treatment. Nasal washings and aspirates are unacceptable for Xpert Xpress  SARS-CoV-2/FLU/RSV testing.  Fact Sheet for Patients: BloggerCourse.com  Fact Sheet for Healthcare Providers: SeriousBroker.it  This test is not yet approved or cleared by the [place] FDA and has been authorized for detection and/or diagnosis of SARS-CoV-2 by FDA under an Emergency Use Authorization (EUA). This EUA will remain in effect (meaning this test can be used) for the duration of the COVID-19 declaration under Section 564(b)(1) of the Act, 21 U.S.C. section 360bbb-3(b)(1), unless the authorization is terminated or revoked.     Resp Syncytial Virus by PCR NEGATIVE NEGATIVE Final    Comment: (NOTE) Fact Sheet for Patients: BloggerCourse.com  Fact Sheet for Healthcare Providers: SeriousBroker.it  This test is not yet approved or cleared by the [place] FDA and has been authorized for detection and/or diagnosis of SARS-CoV-2 by FDA under an Emergency Use Authorization (EUA). This EUA will remain in effect (meaning this test can be used) for the duration of the COVID-19 declaration under Section 564(b)(1) of the Act, 21 U.S.C. section 360bbb-3(b)(1), unless the authorization is terminated or revoked.  Performed at [facility], [address], [place], [place] [address]   Respiratory (~20 pathogens) panel by PCR     Status: Abnormal   Collection Time: 06/28/23  3:52 PM   Specimen: Nasopharyngeal Swab; Respiratory  Result Value Ref Range Status   Adenovirus NOT DETECTED NOT DETECTED Final   Coronavirus 229E NOT DETECTED NOT DETECTED Final    Comment: (NOTE) The Coronavirus on the Respiratory Panel, DOES NOT test for the novel  Coronavirus (2019 nCoV)    Coronavirus HKU1 NOT DETECTED NOT DETECTED Final   Coronavirus NL63 NOT DETECTED NOT DETECTED Final   Coronavirus OC43 NOT DETECTED NOT DETECTED Final   Metapneumovirus DETECTED (A) NOT DETECTED Final    Rhinovirus / Enterovirus NOT DETECTED NOT DETECTED Final   Influenza A NOT DETECTED NOT DETECTED Final   Influenza B NOT DETECTED NOT DETECTED Final   Parainfluenza Virus 1 NOT DETECTED  NOT DETECTED Final   Parainfluenza Virus 2 NOT DETECTED NOT DETECTED Final   Parainfluenza Virus 3 NOT DETECTED NOT DETECTED Final   Parainfluenza Virus 4 NOT DETECTED NOT DETECTED Final   Respiratory Syncytial Virus NOT DETECTED NOT DETECTED Final   Bordetella pertussis NOT DETECTED NOT DETECTED Final   Bordetella Parapertussis NOT DETECTED NOT DETECTED Final   Chlamydophila pneumoniae NOT DETECTED NOT DETECTED Final   Mycoplasma pneumoniae NOT DETECTED NOT DETECTED Final    Comment: Performed at [facility], [address] [place] [address], [place], [place] [address]    Radiology Studies: ECHOCARDIOGRAM COMPLETE Result Date: 07/02/2023    ECHOCARDIOGRAM REPORT   Patient Name:   Jeffrey Depaulo. Date of Exam: 07/02/2023 Medical Rec #:  [id number]           Height:       70.0 in Accession #:    [id number]          Weight:       141.8 lb Date of Birth:  [birth year]            BSA:          1.803 m Patient Age:    82 years            BP:           141/55 mmHg Patient Gender: M                   HR:           79 bpm. Exam Location:  Inpatient Procedure: 2D Echo, Cardiac Doppler and Color Doppler (Both Spectral and Color            Flow Doppler were utilized during procedure). Indications:    Dyspnea R06.00  History:        Patient has no prior history of Echocardiogram examinations.                 COPD; Risk Factors:Hypertension, Diabetes, Dyslipidemia and                 Current Smoker.  Sonographer:    Lucendia Herrlich RCS Referring Phys: [id number] Kateri Mc LATIF [facility] IMPRESSIONS  1. Left ventricular ejection fraction, by estimation, is >75%. The left ventricle has hyperdynamic function. The left ventricle has no regional wall motion abnormalities. There is moderate left ventricular hypertrophy. Left ventricular diastolic  parameters were normal.  2. Right ventricular systolic function is normal. The right ventricular size is normal. Mildly increased right ventricular wall thickness. There is mildly elevated pulmonary artery systolic pressure. The estimated right ventricular systolic pressure is 38.7 mmHg.  3. A small pericardial effusion is present. The pericardial effusion is anterior to the right ventricle.  4. The mitral valve is degenerative. Mild mitral valve regurgitation. No evidence of mitral stenosis.  5. The aortic valve is tricuspid. Aortic valve regurgitation is not visualized. Aortic valve sclerosis is present, with no evidence of aortic valve stenosis.  6. The inferior vena cava is dilated in size with >50% respiratory variability, suggesting right atrial pressure of 8 mmHg. Comparison(s): No prior Echocardiogram. Conclusion(s)/Recommendation(s): Consider workup for infiltrative process (i.e cardiac amyloidosis) if clinically warranted. FINDINGS  Left Ventricle: Left ventricular ejection fraction, by estimation, is >75%. The left ventricle has hyperdynamic function. The left ventricle has no regional wall motion abnormalities. The left ventricular internal cavity size was normal in size. There is moderate left ventricular hypertrophy. Left ventricular diastolic parameters were normal. Right Ventricle: The right  ventricular size is normal. Mildly increased right ventricular wall thickness. Right ventricular systolic function is normal. There is mildly elevated pulmonary artery systolic pressure. The tricuspid regurgitant velocity is 2.77 m/s, and with an assumed right atrial pressure of 8 mmHg, the estimated right ventricular systolic pressure is 38.7 mmHg. Left Atrium: Left atrial size was normal in size. Right Atrium: Right atrial size was normal in size. Pericardium: A small pericardial effusion is present. The pericardial effusion is anterior to the right ventricle. Mitral Valve: The mitral valve is degenerative in  appearance. Mild mitral annular calcification. Mild mitral valve regurgitation. No evidence of mitral valve stenosis. Tricuspid Valve: The tricuspid valve is normal in structure. Tricuspid valve regurgitation is not demonstrated. No evidence of tricuspid stenosis. Aortic Valve: The aortic valve is tricuspid. Aortic valve regurgitation is not visualized. Aortic valve sclerosis is present, with no evidence of aortic valve stenosis. Aortic valve peak gradient measures 10.1 mmHg. Pulmonic Valve: The pulmonic valve was grossly normal. Pulmonic valve regurgitation is not visualized. No evidence of pulmonic stenosis. Aorta: The aortic root and ascending aorta are structurally normal, with no evidence of dilitation. Venous: The inferior vena cava is dilated in size with greater than 50% respiratory variability, suggesting right atrial pressure of 8 mmHg. IAS/Shunts: The atrial septum is grossly normal.  LEFT VENTRICLE PLAX 2D LVIDd:         3.70 cm   Diastology LVIDs:         2.20 cm   LV e' medial:    7.94 cm/s LV PW:         1.40 cm   LV E/e' medial:  11.1 LV IVS:        1.40 cm   LV e' lateral:   9.46 cm/s LVOT diam:     2.10 cm   LV E/e' lateral: 9.3 LV SV:         74 LV SV Index:   41 LVOT Area:     3.46 cm  RIGHT VENTRICLE             IVC RV S prime:     21.10 cm/s  IVC diam: 2.20 cm TAPSE (M-mode): 2.5 cm LEFT ATRIUM             Index        RIGHT ATRIUM           Index LA diam:        4.40 cm 2.44 cm/m   RA Area:     16.60 cm LA Vol (A2C):   56.8 ml 31.50 ml/m  RA Volume:   38.30 ml  21.24 ml/m LA Vol (A4C):   39.7 ml 22.02 ml/m LA Biplane Vol: 49.6 ml 27.51 ml/m  AORTIC VALVE AV Area (Vmax): 2.51 cm AV Vmax:        159.00 cm/s AV Peak Grad:   10.1 mmHg LVOT Vmax:      115.00 cm/s LVOT Vmean:     74.500 cm/s LVOT VTI:       0.214 m  AORTA Ao Root diam: 3.40 cm Ao Asc diam:  3.20 cm MITRAL VALVE                TRICUSPID VALVE MV Area (PHT): 3.17 cm     TR Peak grad:   30.7 mmHg MV Decel Time: 239 msec     TR  Vmax:        277.00 cm/s MV E velocity: 87.90 cm/s MV A velocity: 105.00 cm/s  SHUNTS MV E/A ratio:  0.84         Systemic VTI:  0.21 m                             Systemic Diam: 2.10 cm Sunit Tolia Electronically signed by Tessa Lerner Signature Date/Time: 07/02/2023/4:05:33 PM    Final    DG CHEST PORT 1 VIEW Result Date: 07/02/2023 CLINICAL DATA:  Shortness of breath. EXAM: PORTABLE CHEST 1 VIEW COMPARISON:  One-view chest x-ray 08/31/2023. FINDINGS: Heart is enlarged. Increasing interstitial edema is present. Asymmetric left-sided airspace disease is present. The visualized soft tissues and bony thorax are unremarkable. IMPRESSION: 1. Cardiomegaly and increasing interstitial edema compatible with congestive heart failure. 2. Asymmetric left-sided airspace disease may represent asymmetric edema or infection. Electronically Signed   By: Marin Roberts M.D.   On: 07/02/2023 11:18   DG CHEST PORT 1 VIEW Result Date: 07/01/2023 CLINICAL DATA:  83 year old male with shortness of breath and cough. EXAM: PORTABLE CHEST 1 VIEW COMPARISON:  Portable chest 06/30/2023 and earlier. FINDINGS: Portable AP semi upright view at 0549 hours. Stable lung volumes and mediastinal contours. Emphysema and lung scarring demonstrated by CT in 2022. Asymmetric lung size not significantly changed since that time. Stable chronic blunting of the costophrenic angles. Coarse bilateral pulmonary interstitial markings. No pneumothorax or acute pulmonary opacity. No acute osseous abnormality identified. Negative visible bowel gas. IMPRESSION: Chronic lung disease with Emphysema (ICD10-J43.9) and scarring. No acute cardiopulmonary abnormality. Electronically Signed   By: Odessa Fleming M.D.   On: 07/01/2023 08:44   Scheduled Meds:  amLODipine  10 mg Oral Daily   arformoterol  15 mcg Nebulization BID   budesonide (PULMICORT) nebulizer solution  0.5 mg Nebulization BID   feeding supplement  237 mL Oral BID BM   finasteride  5 mg Oral Daily    furosemide  40 mg Intravenous Once   guaiFENesin  1,200 mg Oral BID   heparin  5,000 Units Subcutaneous Q8H   insulin aspart  0-15 Units Subcutaneous TID WC   insulin aspart  0-5 Units Subcutaneous QHS   insulin aspart  6 Units Subcutaneous TID WC   insulin glargine  10 Units Subcutaneous BID   levalbuterol  0.63 mg Nebulization Q6H   methylPREDNISolone (SOLU-MEDROL) injection  40 mg Intravenous Q12H   nicotine  21 mg Transdermal Daily   revefenacin  175 mcg Nebulization Daily   simvastatin  10 mg Oral QHS   sodium bicarbonate  650 mg Oral BID   sodium chloride flush  3 mL Intravenous Q12H   tamsulosin  0.8 mg Oral QHS   Continuous Infusions:  azithromycin 500 mg (07/02/23 1544)   cefTRIAXone (ROCEPHIN)  IV 2 g (07/02/23 1458)    LOS: 3 days   Marguerita Merles, DO [facility] Available via Epic secure chat 7am-7pm After these hours, please refer to coverage provider listed on [email or web address] 07/02/2023, 5:24 PM

## 2023-07-02 NOTE — Progress Notes (Signed)
 RT called and made aware of MD's request to wean HHFNC to maintain O2 sats between 92 and 95%.

## 2023-07-02 NOTE — Progress Notes (Signed)
 NAME:  Jeffrey Randall., MRN:  [id number], DOB:  [birth year], LOS: 3 ADMISSION DATE:  06/28/2023, CONSULTATION DATE:  06/30/23 REFERRING MD:  Marland Mcalpine, CHIEF COMPLAINT:  SOB   History of Present Illness:  83 yo M PMH COPD, Lung ca s/p radiation, ongoing tobacco use, CKD 3a, HTN, DM who was admitted to [facility] 06/28/23 after presenting to ED w CC progressive SOB. Associated productive coug, worse DOE with only short distances. Minimal improvement w nebs.   CXR 3/18 w L basilar opacity and irregular density in r apex. A CT chest was recommended.  RVP revealed + metapneumovirus  Was started on steroids and breo + incuse in place of home trelegy + [facility] atrovent VQ scan ordered.   Pertinent  Medical History   Past Medical History:  Diagnosis Date   Alcoholism in recovery [facility])    Aortic atherosclerosis (HCC)    Back pain 06/17/2014   Chronic lung disease    CKD (chronic kidney disease)    COPD (chronic obstructive pulmonary disease) (HCC) 07/23/2014   Essential hypertension    Gout    History of lung cancer    History of pancreatitis    Iron deficiency anemia    Lumbar disc disease    Mixed hyperlipidemia    Notalgia    Prostate hypertrophy    Pulmonary nodules 07/23/2014   Swelling    Tobacco use disorder 07/23/2014   Tumor of lung 01/26/2021   Type 2 diabetes mellitus without complication (HCC)    Uncontrolled type 2 diabetes mellitus with hyperglycemia, without long-term current use of insulin (HCC)      Significant Hospital Events: Including procedures, antibiotic start and stop dates in addition to other pertinent events   3/18 presented with SOB, admitted for AECOPD with metapneumovirus  3/19 BD changed to brovana budesonide atrovent PRN albuterol  3/20 PCCM is consulted for dyspnea. SpO2 98-100 on 1.5-2.5L   Interim History / Subjective:   He reports feeling tired today and that he is working harder to breath He is on HHFNC 35% 40L. Family is at the bedside  Objective    Blood pressure (!) 155/63, pulse 79, temperature 97.7 F (36.5 C), temperature source Axillary, resp. rate (!) 25, height 5\' 10"  (1.778 m), weight 64.3 kg, SpO2 100%.    Vent Mode: Stand-by FiO2 (%):  [34 %-35 %] 34 %   Intake/Output Summary (Last 24 hours) at 07/02/2023 1535 Last data filed at 07/02/2023 1500 Gross per 24 hour  Intake --  Output 300 ml  Net -300 ml   Filed Weights   06/28/23 1219  Weight: 64.3 kg    Examination: General: Acute on chronically ill appearing elderly male lying in bed, in NAD HEENT: [place]/AT, MM pink/moist, PERRL,  Neuro: Alert and oriented x3, non-focal  CV: s1s2 regular rate and rhythm, no murmur, rubs, or gallops,  PULM:  diffuse wheezing, improved air movement today GI: soft, bowel sounds active in all 4 quadrants, non-tender, non-distended, tolerating Oral diet  Extremities: warm/dry, no edema  Skin: no rashes or lesions  Resolved Hospital Problem list     Assessment & Plan:  Acute hypoxic respiratory failure in the setting of Metapneumovirus and acute exacerbation of COPD -On Trelegy and Butisol at baseline  Current daily tobacco use History of stage 1A lung cancer presumed NSCLC in the right upper lobe with concern for post radiation fibrosis  -Treated with radiation in 2022 -Repeat PET scan 12/22/22 per CareEverywhere with no signs of recurrent cancer but  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1* 28.9* 29.8*  MCV 97.7 96.6 95.9 93.5 94.6  PLT 241 245 260 268 259    Basic Metabolic Panel: Recent Labs  Lab 06/28/23 1335 06/29/23 1019 06/30/23 0814 07/01/23 0332 07/02/23 0259  NA 139 135 135 138 138  K 4.4 4.2 4.0 4.5 4.5  CL 111 108 110 111 115*  CO2 18* 17* 17* 18* 17*  GLUCOSE 283* 541* 318* 196* 135*  BUN 44* 46* 57* 67* 74*  CREATININE 2.70* 2.57* 2.63* 2.75* 2.77*  CALCIUM 8.8* 8.4* 8.7* 8.7* 8.1*  MG  --  2.2 2.2 2.2 2.1  PHOS  --  4.6 4.5 4.5 4.5   GFR: Estimated Creatinine Clearance: 18.4 mL/min (A) (by C-G formula based on SCr of 2.77 mg/dL (H)). Recent Labs  Lab 06/29/23 1019 06/30/23 0814 07/01/23 0332 07/02/23 0259  WBC 7.7 15.9* 15.0* 13.2*    Liver Function Tests: Recent Labs  Lab 06/29/23 1019 06/30/23 0814 07/01/23 0332 07/02/23 0259  AST 22 17 23 26   ALT 30 26 25 30   ALKPHOS 113 123 102 101  BILITOT 0.2 0.2 0.3 0.5  PROT 5.6* 5.7* 5.8* 5.5*  ALBUMIN 2.1* 2.1* 2.1* 2.1*   No results for input(s): "LIPASE", "AMYLASE" in the last 168 hours. No results for input(s): "AMMONIA" in the last 168 hours.  ABG    Component Value Date/Time   PHART 7.28 (L) 07/02/2023 0856   PCO2ART 37 07/02/2023 0856   PO2ART 133 (H) 07/02/2023 0856   HCO3 17.4 (L) 07/02/2023 0856   ACIDBASEDEF 8.6 (H) 07/02/2023 0856   O2SAT 100 07/02/2023 0856     Coagulation Profile: No results for input(s): "INR", "PROTIME" in the last 168 hours.  Cardiac Enzymes: No results for input(s): "CKTOTAL", "CKMB", "CKMBINDEX", "TROPONINI" in the last 168 hours.  HbA1C: Hgb A1c MFr Bld  Date/Time Value Ref Range Status  06/29/2023 10:19 AM 9.9 (H) 4.8 - 5.6 % Final    Comment:    (NOTE) Pre diabetes:          5.7%-6.4%  Diabetes:              >6.4%  Glycemic control for   <7.0% adults with diabetes   06/18/2014 06:38 AM 8.7 (H) 4.8 - 5.6 % Final    Comment:     (NOTE)         Pre-diabetes: 5.7 - 6.4         Diabetes: >6.4         Glycemic control for adults with diabetes: <7.0     CBG: Recent Labs  Lab 07/01/23 1637 07/01/23 1944 07/02/23 0311 07/02/23 0637 07/02/23 1116  GLUCAP 316* 235* 131* 168* 255*       Critical care time:  NA   Melody Comas, MD [facility]  Office: [phone]   See Amion for personal pager PCCM on call pager [phone] until 7pm. Please call Elink 7p-7a. [phone]

## 2023-07-02 NOTE — Plan of Care (Signed)

## 2023-07-02 NOTE — Progress Notes (Signed)
 RT note. Patient placed on HHFNC at this time per MD request. 40L-35% sat 100% with labored breathing noted at this. ABG collected and sent to lab, lab notified. RT will continue to monitor.    07/02/23 0858  Therapy Vitals  Pulse Rate 80  Resp (!) 23  MEWS Score/Color  MEWS Score 1  MEWS Score Color Green  Respiratory Assessment  Assessment Type Assess only  Respiratory Pattern Regular;Labored;Dyspnea at rest  Chest Assessment Chest expansion symmetrical  Cough Non-productive;Congested  Oxygen Therapy/Pulse Ox  O2 Device (S)  HHFNC  O2 Therapy Oxygen humidified  Heater temperature 86 F (30 C)  O2 Flow Rate (L/min) 40 L/min  FiO2 (%) 35 %  SpO2 100 %

## 2023-07-02 NOTE — Progress Notes (Signed)
 RT note. Patient noted at this time with labored breathing, asked patient if he would like to put bipap on at this time. Patient stated "he is fine for right now". Breathing treatment given. Patient currently on 2 L [place] sat 100%, Bipap on stand by if needed later. RT will continue to monitor.    07/02/23 0810  Therapy Vitals  Pulse Rate 88  Resp 18  MEWS Score/Color  MEWS Score 0  MEWS Score Color Green  Respiratory Assessment  Assessment Type Mid-treatment  Respiratory Pattern Regular;Labored;Dyspnea at rest  Chest Assessment Chest expansion symmetrical  Cough Non-productive;Congested  Bilateral Breath Sounds Expiratory wheezes;Rhonchi  Oxygen Therapy/Pulse Ox  O2 Device Nasal Cannula  O2 Therapy Oxygen  O2 Flow Rate (L/min) 2 L/min  SpO2 100 %

## 2023-07-02 NOTE — Progress Notes (Signed)
 Echocardiogram 2D Echocardiogram has been performed.  Jeffrey Randall 07/02/2023, 3:00 PM

## 2023-07-03 ENCOUNTER — Inpatient Hospital Stay (HOSPITAL_COMMUNITY)

## 2023-07-03 DIAGNOSIS — N400 Enlarged prostate without lower urinary tract symptoms: Secondary | ICD-10-CM | POA: Diagnosis not present

## 2023-07-03 DIAGNOSIS — I10 Essential (primary) hypertension: Secondary | ICD-10-CM | POA: Diagnosis not present

## 2023-07-03 DIAGNOSIS — M79661 Pain in right lower leg: Secondary | ICD-10-CM

## 2023-07-03 DIAGNOSIS — N179 Acute kidney failure, unspecified: Secondary | ICD-10-CM | POA: Diagnosis not present

## 2023-07-03 DIAGNOSIS — J9601 Acute respiratory failure with hypoxia: Secondary | ICD-10-CM | POA: Diagnosis not present

## 2023-07-03 LAB — GLUCOSE, CAPILLARY
Glucose-Capillary: 138 mg/dL — ABNORMAL HIGH (ref 70–99)
Glucose-Capillary: 187 mg/dL — ABNORMAL HIGH (ref 70–99)
Glucose-Capillary: 207 mg/dL — ABNORMAL HIGH (ref 70–99)
Glucose-Capillary: 252 mg/dL — ABNORMAL HIGH (ref 70–99)
Glucose-Capillary: 245 mg/dL — ABNORMAL HIGH (ref 70–99)
Glucose-Capillary: 336 mg/dL — ABNORMAL HIGH (ref 70–99)

## 2023-07-03 LAB — CBC WITH DIFFERENTIAL/PLATELET
Abs Immature Granulocytes: 0.08 10*3/uL — ABNORMAL HIGH (ref 0.00–0.07)
Basophils Absolute: 0 10*3/uL (ref 0.0–0.1)
Basophils Relative: 0 %
Eosinophils Absolute: 0 10*3/uL (ref 0.0–0.5)
Eosinophils Relative: 0 %
HCT: 30.9 % — ABNORMAL LOW (ref 39.0–52.0)
Hemoglobin: 10.3 g/dL — ABNORMAL LOW (ref 13.0–17.0)
Immature Granulocytes: 1 %
Lymphocytes Relative: 3 %
Lymphs Abs: 0.3 10*3/uL — ABNORMAL LOW (ref 0.7–4.0)
MCH: 31.7 pg (ref 26.0–34.0)
MCHC: 33.3 g/dL (ref 30.0–36.0)
MCV: 95.1 fL (ref 80.0–100.0)
Monocytes Absolute: 0.2 10*3/uL (ref 0.1–1.0)
Monocytes Relative: 2 %
Neutro Abs: 11.1 10*3/uL — ABNORMAL HIGH (ref 1.7–7.7)
Neutrophils Relative %: 94 %
Platelets: 283 10*3/uL (ref 150–400)
RBC: 3.25 MIL/uL — ABNORMAL LOW (ref 4.22–5.81)
RDW: 15.6 % — ABNORMAL HIGH (ref 11.5–15.5)
WBC: 11.6 10*3/uL — ABNORMAL HIGH (ref 4.0–10.5)
nRBC: 0 % (ref 0.0–0.2)

## 2023-07-03 LAB — COMPREHENSIVE METABOLIC PANEL
ALT: 32 U/L (ref 0–44)
AST: 26 U/L (ref 15–41)
Albumin: 2.2 g/dL — ABNORMAL LOW (ref 3.5–5.0)
Alkaline Phosphatase: 94 U/L (ref 38–126)
Anion gap: 11 (ref 5–15)
BUN: 73 mg/dL — ABNORMAL HIGH (ref 8–23)
CO2: 17 mmol/L — ABNORMAL LOW (ref 22–32)
Calcium: 8.3 mg/dL — ABNORMAL LOW (ref 8.9–10.3)
Chloride: 112 mmol/L — ABNORMAL HIGH (ref 98–111)
Creatinine, Ser: 2.73 mg/dL — ABNORMAL HIGH (ref 0.61–1.24)
GFR, Estimated: 22 mL/min — ABNORMAL LOW (ref >60)
Glucose, Bld: 268 mg/dL — ABNORMAL HIGH (ref 70–99)
Potassium: 4.9 mmol/L (ref 3.5–5.1)
Sodium: 140 mmol/L (ref 135–145)
Total Bilirubin: 0.5 mg/dL (ref 0.0–1.2)
Total Protein: 5.8 g/dL — ABNORMAL LOW (ref 6.5–8.1)

## 2023-07-03 LAB — MAGNESIUM: Magnesium: 2.1 mg/dL (ref 1.7–2.4)

## 2023-07-03 LAB — PHOSPHORUS: Phosphorus: 5.7 mg/dL — ABNORMAL HIGH (ref 2.5–4.6)

## 2023-07-03 MED ORDER — FUROSEMIDE 10 MG/ML IJ SOLN
40.0000 mg | Freq: Once | INTRAMUSCULAR | Status: AC
  Administered 2023-07-03: 40 mg via INTRAVENOUS
  Filled 2023-07-03: qty 4

## 2023-07-03 MED ORDER — DEXTROSE 50 % IV SOLN
INTRAVENOUS | Status: AC
  Filled 2023-07-03: qty 50

## 2023-07-03 NOTE — Progress Notes (Signed)
 PROGRESS NOTE    Jeffrey Randall.  ZOX:[id number] DOB: Jun 15, 1940 DOA: 06/28/2023 PCP: Carin Hock, PA   Brief Narrative:  Jeffrey Littler. is a 83 y.o. male with medical history significant of hypertension, diabetes, COPD, lung cancer s/p radiation, alcoholism in remission, CKD 3 A, gout, hyperlipidemia, BPH presenting with worsening shortness of breath. Further workup reveals + Metapneumovirus. Ruling out PE but VQ Scan unable to be done as patient could not lay flat. Nebulized Medications are being further adjusted.    Continued to be dyspneic so Pulmonary was consulted and they have made several medication changes.  Patient had to be transferred to the progressive care unit and to be placed on heated high flow nasal cannula.  Oxygen requirements are now weaning.  IV morphine and lorazepam were added.  He is now on empiric antibiotics and given a dose of IV Lasix.  Echocardiogram and lower extremity duplex negative.  Assessment and Plan:  Acute Respiratory failure with hypoxia in the setting of Metapneumovirus with Acute COPD exacerbation Rule out pulmonary embolism -Respiratory panel is + for Metapneumovirus.  No leukocytosis to indicate bacterial etiology.   -Initial CXR did show irregularity/solid density at the right lung apex recommending possible CT to follow-up.  However, patient had consolidation at this location noted on CT in August with read Care Everywhere.  PET scan was done in follow-up considering patient's history of lung cancer which showed consolidation superimposed on the right upper lobe radiation fibrosis favoring infection versus inflammation but no evidence of recurrence of lung cancer or metastatic disease. -ABG done and showed a pH of 7.28, pCO2 37, pO2 133, HCO3 of 17.4, and a O2 saturation of 100% -Continue with IV Solu-Medrol 40 mg twice daily -Scheduled Xopenex/Atrovent with PRN  As needed albuterol and now they are adding revefenacin 175 mcg nebs  daily -C/w Afromoterol 15 mcg Neb BID and Budesonide 0.25 mg Daily -VQ scan when able to be done when he is able to lie flat; ECHO as below and LE duplex Negative for DVT B/L -CXR this AM showed cardiomegaly and increased creasing interstitial edema which was compatible with CHF and as well as asymmetric left-sided airspace disease that may represent asymmetric edema or infection.  Given a dose of IV Lasix 40 mg and 1 yesterday and this is being repeated today by the pulmonary team -C/w Empiric Abx with IV Ceftriaxone and Azithromycin to treat for superimposed bacterial infection for 5 Days; today is day (2/5 of antibiotics) -Appreciate further Pulmonary Evaluation and recommendations and he had been transitioned to heated high flow nasal cannula 40 L oxygen with 30% FiO2 and has now been reduced to 25 L with 25% FiO2 .  PT/OT recommending home health at discharge.  Chest pain and discomfort: Happened when he started having worsening breathing.  BNP was 190.9 check troponins x 2 and went from 22 and is now 24.  EKG done showed a normal sinus rhythm with a rate of 70 and a QTc of 442.  Echocardiogram done and showed LVEF of >75% w/ no RWMA, and Normal LVDP. RVSF was normal. Discomfort has resolved.   AKI on CKD 3A / Metabolic Acidosis:  Creatinine elevated to 2.7 from previous baseline of 1.4 (though this was 2 years ago) Did reportedly have some recent decreased p.o. intake. Received 1 L in the ED. BUN/Cr Trend: Recent Labs  Lab 06/28/23 1335 06/29/23 1019 06/30/23 0814 07/01/23 0332 07/02/23 0259 07/03/23 1018  BUN 44* 46* 57* 67* 74*  73*  CREATININE 2.70* 2.57* 2.63* 2.75* 2.77* 2.73*  -IVF now discontinued; this appears to be likely his new baseline; Given IV Lasix 40 mg x1 as above yesterday and will be given another 1 today -Patient continues have a slight metabolic acidosis he has a CO2 17, chloride level of 112, anion gap of 11.  C/w Sodium Bicarbonate 650 mg po BID -Avoid Nephrotoxic  Medications, Contrast Dyes, Hypotension and Dehydration to Ensure Adequate Renal Perfusion and will need to Renally Adjust Meds. CTM and Trend Renal Function carefully and repeat CMP in the AM    Leukocytosis: Setting of steroid demargination.  Patient's WBC is now trending back down and gone from 15.9 and is 11.6 today.  Continue to monitor for signs or symptoms of infection.  Repeat CBC showed mild improvement today at 15.0.  Continue to monitor for signs and symptoms of infection  History of Lung Cancer: Status post radiation treatment in 2022.  As above had CT with abnormality last year and follow-up PET scan showed no recurrence of disease. Will need outpt f/u with his cancer doctors.  Pulmonary consulted as above   Essential HTN: C/w Amlodipine 10 mg po Daily.  Hold Valsartan/Hydrochlorothiazide in the setting of AKI. CTM BP per Protocol. Last BP reading was 148/65   Diabetes Mellitus Type 2: Home medication is 70/30 insulin SSI TID. C/w Moderate Novolog SSI AC. CTM for Hyperglycemia with steroids for COPD. Adjust Insulin regimen as necessary. CBGs significantly elevated. Add Lantus 10 units Daily and increased to BID. Start Moderate Novolog SSI q4h and Novolog Meal Coverage 6 units TID if eats 50% or Greater. CBGs ranging from 138-252   Gout: Holding Allopurinol 100 mg po Daily in the setting of AKI   BPH: C/w Finasteride 5 mg po Daily and Tamsulosin 0.8 mg po qHS  Normocytic Anemia: Hgb/Hct stable around 10 and is now 10.3/30.9.  Anemia panel showed an Fe level of 35, UIBC 189, TIBC 224, saturation ratios of 16%, ferritin of 147, folate level 8.6 and vitamin B12 1449 CTM for S/Sx of Bleeding; No overt bleeding noted. Repeat CBC in the AM   Hyperlipidemia: C/w Simvastatin 10 mg po Daily   Hypoalbuminemia: Albumin Level stable at 2.2. CTM and Trend and repeat CMP in the AM   DVT prophylaxis: heparin injection 5,000 Units Start: 06/28/23 1530    Code Status: Full Code Family  Communication: No family currently at bedside  Disposition Plan:  Level of care: Progressive Status is: Inpatient Remains inpatient appropriate because: His further clinical improvement in his respiratory status and clearance by the pulmonary team.   Consultants:  Pulmonary  Procedures:  As delineated as above; ECHOCardiogram  Antimicrobials:  Anti-infectives (From admission, onward)    Start     Dose/Rate Route Frequency Ordered Stop   07/02/23 1500  cefTRIAXone (ROCEPHIN) 2 g in sodium chloride 0.9 % 100 mL IVPB        2 g 200 mL/hr over 30 Minutes Intravenous Every 24 hours 07/02/23 1403     07/02/23 1445  azithromycin (ZITHROMAX) 500 mg in sodium chloride 0.9 % 250 mL IVPB        500 mg 250 mL/hr over 60 Minutes Intravenous Every 24 hours 07/02/23 1349     07/02/23 1445  cefTRIAXone (ROCEPHIN) 1 g in sodium chloride 0.9 % 100 mL IVPB  Status:  Discontinued        1 g 200 mL/hr over 30 Minutes Intravenous Every 24 hours 07/02/23 1349 07/02/23 1403  Subjective: Seen and examined at bedside AICD is at the bed and states that he felt "okay".  States his respiratory status feels about the same.  States that he is urinating more.  No nausea or vomiting.  Denied any lightheadedness or dizziness.  Objective: Vitals:   07/03/23 0843 07/03/23 1151 07/03/23 1358 07/03/23 1522  BP:  (!) 168/65  (!) 148/65  Pulse:   78 82  Resp:   16 19  Temp:    98.3 F (36.8 C)  TempSrc:    Oral  SpO2: 100%  100% 100%  Weight:      Height:        Intake/Output Summary (Last 24 hours) at 07/03/2023 1911 Last data filed at 07/03/2023 1717 Gross per 24 hour  Intake 240 ml  Output 1100 ml  Net -860 ml   Filed Weights   06/28/23 1219  Weight: 64.3 kg   Examination: Physical Exam:  Constitutional: Thin elderly African-American male who appears in mild respiratory distress and is a little uncomfortable seen at the edge of the bed Respiratory: Diminished to auscultation bilaterally  with some coarse breath sounds and does have some wheezing and some slight rhonchi crackles.  No appreciable rales.  Has a slightly elevated respiratory effort he is mildly tachypneic.  Wearing heated high flow nasal cannula but oxygen requirements are weaning. Cardiovascular: RRR, no murmurs / rubs / gallops. S1 and S2 auscultated. No extremity edema.   Abdomen: Soft, non-tender, non-distended. Bowel sounds positive.  GU: Deferred. Musculoskeletal: No clubbing / cyanosis of digits/nails. No joint deformity upper and lower extremities.  Skin: No rashes, lesions, ulcers on limited skin evaluation. No induration; Warm and dry.  Neurologic: CN 2-12 grossly intact with no focal deficits.  Romberg sign cerebellar reflexes not assessed.  Psychiatric: Normal judgment and insight. Alert and oriented x 3.  A Little bit anxious  Data Reviewed: I have personally reviewed following labs and imaging studies  CBC: Recent Labs  Lab 06/30/23 0814 07/01/23 0332 07/02/23 0259 07/02/23 1853 07/03/23 1018  WBC 15.9* 15.0* 13.2* 13.1* 11.6*  NEUTROABS 15.1* 14.1* 12.6* 12.5* 11.1*  HGB 10.1* 9.7* 10.0* 10.1* 10.3*  HCT 30.1* 28.9* 29.8* 31.1* 30.9*  MCV 95.9 93.5 94.6 96.9 95.1  PLT 260 268 259 289 283   Basic Metabolic Panel: Recent Labs  Lab 06/29/23 1019 06/30/23 0814 07/01/23 0332 07/02/23 0259 07/03/23 1018  NA 135 135 138 138 140  K 4.2 4.0 4.5 4.5 4.9  CL 108 110 111 115* 112*  CO2 17* 17* 18* 17* 17*  GLUCOSE 541* 318* 196* 135* 268*  BUN 46* 57* 67* 74* 73*  CREATININE 2.57* 2.63* 2.75* 2.77* 2.73*  CALCIUM 8.4* 8.7* 8.7* 8.1* 8.3*  MG 2.2 2.2 2.2 2.1 2.1  PHOS 4.6 4.5 4.5 4.5 5.7*   GFR: Estimated Creatinine Clearance: 18.6 mL/min (A) (by C-G formula based on SCr of 2.73 mg/dL (H)). Liver Function Tests: Recent Labs  Lab 06/29/23 1019 06/30/23 0814 07/01/23 0332 07/02/23 0259 07/03/23 1018  AST 22 17 23 26 26   ALT 30 26 25 30  32  ALKPHOS 113 123 102 101 94  BILITOT 0.2  0.2 0.3 0.5 0.5  PROT 5.6* 5.7* 5.8* 5.5* 5.8*  ALBUMIN 2.1* 2.1* 2.1* 2.1* 2.2*   No results for input(s): "LIPASE", "AMYLASE" in the last 168 hours. No results for input(s): "AMMONIA" in the last 168 hours. Coagulation Profile: No results for input(s): "INR", "PROTIME" in the last 168 hours. Cardiac Enzymes: No results for  input(s): "CKTOTAL", "CKMB", "CKMBINDEX", "TROPONINI" in the last 168 hours. BNP (last 3 results) No results for input(s): "PROBNP" in the last 8760 hours. HbA1C: No results for input(s): "HGBA1C" in the last 72 hours. CBG: Recent Labs  Lab 07/03/23 0451 07/03/23 0557 07/03/23 0727 07/03/23 1156 07/03/23 1522  GLUCAP 138* 187* 207* 252* 245*   Lipid Profile: No results for input(s): "CHOL", "HDL", "LDLCALC", "TRIG", "CHOLHDL", "LDLDIRECT" in the last 72 hours. Thyroid Function Tests: No results for input(s): "TSH", "T4TOTAL", "FREET4", "T3FREE", "THYROIDAB" in the last 72 hours. Anemia Panel: Recent Labs    07/01/23 0332  VITAMINB12 1,449*  FOLATE 8.6  FERRITIN 147  TIBC 224*  IRON 35*  RETICCTPCT 1.3   Sepsis Labs: No results for input(s): "PROCALCITON", "LATICACIDVEN" in the last 168 hours.  Recent Results (from the past 240 hours)  Resp panel by RT-PCR (RSV, Flu A&B, Covid) Anterior Nasal Swab     Status: None   Collection Time: 06/28/23 12:27 PM   Specimen: Anterior Nasal Swab  Result Value Ref Range Status   SARS Coronavirus 2 by RT PCR NEGATIVE NEGATIVE Final   Influenza A by PCR NEGATIVE NEGATIVE Final   Influenza B by PCR NEGATIVE NEGATIVE Final    Comment: (NOTE) The Xpert Xpress SARS-CoV-2/FLU/RSV plus assay is intended as an aid in the diagnosis of influenza from Nasopharyngeal swab specimens and should not be used as a sole basis for treatment. Nasal washings and aspirates are unacceptable for Xpert Xpress SARS-CoV-2/FLU/RSV testing.  Fact Sheet for Patients: BloggerCourse.com  Fact Sheet for  Healthcare Providers: SeriousBroker.it  This test is not yet approved or cleared by the [place] FDA and has been authorized for detection and/or diagnosis of SARS-CoV-2 by FDA under an Emergency Use Authorization (EUA). This EUA will remain in effect (meaning this test can be used) for the duration of the COVID-19 declaration under Section 564(b)(1) of the Act, 21 U.S.C. section 360bbb-3(b)(1), unless the authorization is terminated or revoked.     Resp Syncytial Virus by PCR NEGATIVE NEGATIVE Final    Comment: (NOTE) Fact Sheet for Patients: BloggerCourse.com  Fact Sheet for Healthcare Providers: SeriousBroker.it  This test is not yet approved or cleared by the [place] FDA and has been authorized for detection and/or diagnosis of SARS-CoV-2 by FDA under an Emergency Use Authorization (EUA). This EUA will remain in effect (meaning this test can be used) for the duration of the COVID-19 declaration under Section 564(b)(1) of the Act, 21 U.S.C. section 360bbb-3(b)(1), unless the authorization is terminated or revoked.  Performed at [facility], [address], [place], [place] [address]   Respiratory (~20 pathogens) panel by PCR     Status: Abnormal   Collection Time: 06/28/23  3:52 PM   Specimen: Nasopharyngeal Swab; Respiratory  Result Value Ref Range Status   Adenovirus NOT DETECTED NOT DETECTED Final   Coronavirus 229E NOT DETECTED NOT DETECTED Final    Comment: (NOTE) The Coronavirus on the Respiratory Panel, DOES NOT test for the novel  Coronavirus (2019 nCoV)    Coronavirus HKU1 NOT DETECTED NOT DETECTED Final   Coronavirus NL63 NOT DETECTED NOT DETECTED Final   Coronavirus OC43 NOT DETECTED NOT DETECTED Final   Metapneumovirus DETECTED (A) NOT DETECTED Final   Rhinovirus / Enterovirus NOT DETECTED NOT DETECTED Final   Influenza A NOT DETECTED NOT DETECTED Final   Influenza  B NOT DETECTED NOT DETECTED Final   Parainfluenza Virus 1 NOT DETECTED NOT DETECTED Final   Parainfluenza Virus  2 NOT DETECTED NOT DETECTED Final   Parainfluenza Virus 3 NOT DETECTED NOT DETECTED Final   Parainfluenza Virus 4 NOT DETECTED NOT DETECTED Final   Respiratory Syncytial Virus NOT DETECTED NOT DETECTED Final   Bordetella pertussis NOT DETECTED NOT DETECTED Final   Bordetella Parapertussis NOT DETECTED NOT DETECTED Final   Chlamydophila pneumoniae NOT DETECTED NOT DETECTED Final   Mycoplasma pneumoniae NOT DETECTED NOT DETECTED Final    Comment: Performed at [facility], [address], [place], [place] [address]     Radiology Studies: VAS [place] LOWER EXTREMITY VENOUS (DVT) Result Date: 07/03/2023  Lower Venous DVT Study Patient Name:  Jeffrey Fullwood.  Date of Exam:   07/03/2023 Medical Rec #: [id number]            Accession #:    [id number] Date of Birth: 1940/08/11             Patient Gender: M Patient Age:   65 years Exam Location:  [facility] Procedure:      VAS [place] LOWER EXTREMITY VENOUS (DVT) Referring Phys: Marguerita Merles --------------------------------------------------------------------------------  Indications: Pain.  Risk Factors: Cancer Lung. Comparison Study: None. Performing Technologist: Shona Simpson  Examination Guidelines: A complete evaluation includes B-mode imaging, spectral Doppler, color Doppler, and power Doppler as needed of all accessible portions of each vessel. Bilateral testing is considered an integral part of a complete examination. Limited examinations for reoccurring indications may be performed as noted. The reflux portion of the exam is performed with the patient in reverse Trendelenburg.  +---------+---------------+---------+-----------+----------+--------------+ RIGHT    CompressibilityPhasicitySpontaneityPropertiesThrombus Aging +---------+---------------+---------+-----------+----------+--------------+ CFV      Full           Yes       Yes                                 +---------+---------------+---------+-----------+----------+--------------+ SFJ      Full                                                        +---------+---------------+---------+-----------+----------+--------------+ FV Prox  Full                                                        +---------+---------------+---------+-----------+----------+--------------+ FV Mid   Full                                                        +---------+---------------+---------+-----------+----------+--------------+ FV DistalFull                                                        +---------+---------------+---------+-----------+----------+--------------+ PFV      Full                                                        +---------+---------------+---------+-----------+----------+--------------+  POP      Full           Yes      Yes                                 +---------+---------------+---------+-----------+----------+--------------+ PTV      Full                                                        +---------+---------------+---------+-----------+----------+--------------+ PERO     Full                                                        +---------+---------------+---------+-----------+----------+--------------+   +---------+---------------+---------+-----------+----------+--------------+ LEFT     CompressibilityPhasicitySpontaneityPropertiesThrombus Aging +---------+---------------+---------+-----------+----------+--------------+ CFV      Full           Yes      Yes                                 +---------+---------------+---------+-----------+----------+--------------+ SFJ      Full                                                        +---------+---------------+---------+-----------+----------+--------------+ FV Prox  Full                                                         +---------+---------------+---------+-----------+----------+--------------+ FV Mid   Full                                                        +---------+---------------+---------+-----------+----------+--------------+ FV DistalFull                                                        +---------+---------------+---------+-----------+----------+--------------+ PFV      Full                                                        +---------+---------------+---------+-----------+----------+--------------+ POP      Full           Yes      Yes                                 +---------+---------------+---------+-----------+----------+--------------+  PTV      Full                                                        +---------+---------------+---------+-----------+----------+--------------+ PERO     Full                                                        +---------+---------------+---------+-----------+----------+--------------+     Summary: BILATERAL: - No evidence of deep vein thrombosis seen in the lower extremities, bilaterally. -No evidence of popliteal cyst, bilaterally.   *See table(s) above for measurements and observations. Electronically signed by Lemar Livings MD on 07/03/2023 at 5:40:38 PM.    Final    DG CHEST PORT 1 VIEW Result Date: 07/03/2023 CLINICAL DATA:  Shortness of breath. EXAM: PORTABLE CHEST 1 VIEW COMPARISON:  07/02/2023 and CT chest 09/09/2020. FINDINGS: Patient is rotated. Trachea is midline. Heart size normal. Thoracic aorta is calcified. Scarring in the apical right upper lobe. Additional scarring in the left midlung zone. No airspace consolidation or pleural fluid. Chronic blunting of the left costophrenic angle. Left apical pleural thickening. IMPRESSION: No acute findings. Electronically Signed   By: Leanna Battles M.D.   On: 07/03/2023 14:01   ECHOCARDIOGRAM COMPLETE Result Date: 07/02/2023    ECHOCARDIOGRAM REPORT   Patient Name:    Jeffrey Rosenboom. Date of Exam: 07/02/2023 Medical Rec #:  [id number]           Height:       70.0 in Accession #:    [id number]          Weight:       141.8 lb Date of Birth:  23-Aug-1940            BSA:          1.803 m Patient Age:    82 years            BP:           141/55 mmHg Patient Gender: M                   HR:           79 bpm. Exam Location:  Inpatient Procedure: 2D Echo, Cardiac Doppler and Color Doppler (Both Spectral and Color            Flow Doppler were utilized during procedure). Indications:    Dyspnea R06.00  History:        Patient has no prior history of Echocardiogram examinations.                 COPD; Risk Factors:Hypertension, Diabetes, Dyslipidemia and                 Current Smoker.  Sonographer:    Lucendia Herrlich RCS Referring Phys: [id number] Kateri Mc LATIF [facility] IMPRESSIONS  1. Left ventricular ejection fraction, by estimation, is >75%. The left ventricle has hyperdynamic function. The left ventricle has no regional wall motion abnormalities. There is moderate left ventricular hypertrophy. Left ventricular diastolic parameters were normal.  2. Right ventricular systolic function is normal. The right ventricular size is normal. Mildly increased right ventricular  wall thickness. There is mildly elevated pulmonary artery systolic pressure. The estimated right ventricular systolic pressure is 38.7 mmHg.  3. A small pericardial effusion is present. The pericardial effusion is anterior to the right ventricle.  4. The mitral valve is degenerative. Mild mitral valve regurgitation. No evidence of mitral stenosis.  5. The aortic valve is tricuspid. Aortic valve regurgitation is not visualized. Aortic valve sclerosis is present, with no evidence of aortic valve stenosis.  6. The inferior vena cava is dilated in size with >50% respiratory variability, suggesting right atrial pressure of 8 mmHg. Comparison(s): No prior Echocardiogram. Conclusion(s)/Recommendation(s): Consider workup for infiltrative  process (i.e cardiac amyloidosis) if clinically warranted. FINDINGS  Left Ventricle: Left ventricular ejection fraction, by estimation, is >75%. The left ventricle has hyperdynamic function. The left ventricle has no regional wall motion abnormalities. The left ventricular internal cavity size was normal in size. There is moderate left ventricular hypertrophy. Left ventricular diastolic parameters were normal. Right Ventricle: The right ventricular size is normal. Mildly increased right ventricular wall thickness. Right ventricular systolic function is normal. There is mildly elevated pulmonary artery systolic pressure. The tricuspid regurgitant velocity is 2.77 m/s, and with an assumed right atrial pressure of 8 mmHg, the estimated right ventricular systolic pressure is 38.7 mmHg. Left Atrium: Left atrial size was normal in size. Right Atrium: Right atrial size was normal in size. Pericardium: A small pericardial effusion is present. The pericardial effusion is anterior to the right ventricle. Mitral Valve: The mitral valve is degenerative in appearance. Mild mitral annular calcification. Mild mitral valve regurgitation. No evidence of mitral valve stenosis. Tricuspid Valve: The tricuspid valve is normal in structure. Tricuspid valve regurgitation is not demonstrated. No evidence of tricuspid stenosis. Aortic Valve: The aortic valve is tricuspid. Aortic valve regurgitation is not visualized. Aortic valve sclerosis is present, with no evidence of aortic valve stenosis. Aortic valve peak gradient measures 10.1 mmHg. Pulmonic Valve: The pulmonic valve was grossly normal. Pulmonic valve regurgitation is not visualized. No evidence of pulmonic stenosis. Aorta: The aortic root and ascending aorta are structurally normal, with no evidence of dilitation. Venous: The inferior vena cava is dilated in size with greater than 50% respiratory variability, suggesting right atrial pressure of 8 mmHg. IAS/Shunts: The atrial septum  is grossly normal.  LEFT VENTRICLE PLAX 2D LVIDd:         3.70 cm   Diastology LVIDs:         2.20 cm   LV e' medial:    7.94 cm/s LV PW:         1.40 cm   LV E/e' medial:  11.1 LV IVS:        1.40 cm   LV e' lateral:   9.46 cm/s LVOT diam:     2.10 cm   LV E/e' lateral: 9.3 LV SV:         74 LV SV Index:   41 LVOT Area:     3.46 cm  RIGHT VENTRICLE             IVC RV S prime:     21.10 cm/s  IVC diam: 2.20 cm TAPSE (M-mode): 2.5 cm LEFT ATRIUM             Index        RIGHT ATRIUM           Index LA diam:        4.40 cm 2.44 cm/m   RA Area:     16.60  cm LA Vol (A2C):   56.8 ml 31.50 ml/m  RA Volume:   38.30 ml  21.24 ml/m LA Vol (A4C):   39.7 ml 22.02 ml/m LA Biplane Vol: 49.6 ml 27.51 ml/m  AORTIC VALVE AV Area (Vmax): 2.51 cm AV Vmax:        159.00 cm/s AV Peak Grad:   10.1 mmHg LVOT Vmax:      115.00 cm/s LVOT Vmean:     74.500 cm/s LVOT VTI:       0.214 m  AORTA Ao Root diam: 3.40 cm Ao Asc diam:  3.20 cm MITRAL VALVE                TRICUSPID VALVE MV Area (PHT): 3.17 cm     TR Peak grad:   30.7 mmHg MV Decel Time: 239 msec     TR Vmax:        277.00 cm/s MV E velocity: 87.90 cm/s MV A velocity: 105.00 cm/s  SHUNTS MV E/A ratio:  0.84         Systemic VTI:  0.21 m                             Systemic Diam: 2.10 cm Sunit Tolia Electronically signed by Tessa Lerner Signature Date/Time: 07/02/2023/4:05:33 PM    Final    DG CHEST PORT 1 VIEW Result Date: 07/02/2023 CLINICAL DATA:  Shortness of breath. EXAM: PORTABLE CHEST 1 VIEW COMPARISON:  One-view chest x-ray 08/31/2023. FINDINGS: Heart is enlarged. Increasing interstitial edema is present. Asymmetric left-sided airspace disease is present. The visualized soft tissues and bony thorax are unremarkable. IMPRESSION: 1. Cardiomegaly and increasing interstitial edema compatible with congestive heart failure. 2. Asymmetric left-sided airspace disease may represent asymmetric edema or infection. Electronically Signed   By: Marin Roberts M.D.   On:  07/02/2023 11:18   Scheduled Meds:  amLODipine  10 mg Oral Daily   arformoterol  15 mcg Nebulization BID   budesonide (PULMICORT) nebulizer solution  0.5 mg Nebulization BID   feeding supplement  237 mL Oral BID BM   finasteride  5 mg Oral Daily   guaiFENesin  1,200 mg Oral BID   heparin  5,000 Units Subcutaneous Q8H   insulin aspart  0-15 Units Subcutaneous TID WC   insulin aspart  0-5 Units Subcutaneous QHS   insulin aspart  6 Units Subcutaneous TID WC   insulin glargine  10 Units Subcutaneous BID   levalbuterol  0.63 mg Nebulization Q6H   methylPREDNISolone (SOLU-MEDROL) injection  40 mg Intravenous Q12H   nicotine  21 mg Transdermal Daily   revefenacin  175 mcg Nebulization Daily   simvastatin  10 mg Oral QHS   sodium bicarbonate  650 mg Oral BID   sodium chloride flush  3 mL Intravenous Q12H   tamsulosin  0.8 mg Oral QHS   Continuous Infusions:  azithromycin 500 mg (07/03/23 1036)   cefTRIAXone (ROCEPHIN)  IV 2 g (07/03/23 0855)    LOS: 4 days   Marguerita Merles, DO [facility] Available via Epic secure chat 7am-7pm After these hours, please refer to coverage provider listed on [email or web address] 07/03/2023, 7:11 PM

## 2023-07-03 NOTE — Progress Notes (Signed)
 NAME:  Jeffrey Randall., MRN:  [id number], DOB:  17-Jun-1940, LOS: 4 ADMISSION DATE:  06/28/2023, CONSULTATION DATE:  06/30/23 REFERRING MD:  Marland Mcalpine, CHIEF COMPLAINT:  SOB   History of Present Illness:  83 yo M PMH COPD, Lung ca s/p radiation, ongoing tobacco use, CKD 3a, HTN, DM who was admitted to [facility] 06/28/23 after presenting to ED w CC progressive SOB. Associated productive coug, worse DOE with only short distances. Minimal improvement w nebs.   CXR 3/18 w L basilar opacity and irregular density in r apex. A CT chest was recommended.  RVP revealed + metapneumovirus  Was started on steroids and breo + incuse in place of home trelegy + [facility] atrovent VQ scan ordered.   Pertinent  Medical History   Past Medical History:  Diagnosis Date   Alcoholism in recovery [facility])    Aortic atherosclerosis (HCC)    Back pain 06/17/2014   Chronic lung disease    CKD (chronic kidney disease)    COPD (chronic obstructive pulmonary disease) (HCC) 07/23/2014   Essential hypertension    Gout    History of lung cancer    History of pancreatitis    Iron deficiency anemia    Lumbar disc disease    Mixed hyperlipidemia    Notalgia    Prostate hypertrophy    Pulmonary nodules 07/23/2014   Swelling    Tobacco use disorder 07/23/2014   Tumor of lung 01/26/2021   Type 2 diabetes mellitus without complication (HCC)    Uncontrolled type 2 diabetes mellitus with hyperglycemia, without long-term current use of insulin (HCC)      Significant Hospital Events: Including procedures, antibiotic start and stop dates in addition to other pertinent events   3/18 presented with SOB, admitted for AECOPD with metapneumovirus  3/19 BD changed to brovana budesonide atrovent PRN albuterol  3/20 PCCM is consulted for dyspnea. SpO2 98-100 on 1.5-2.5L  3/22 increased WOB. Placed on HHFO2 35% 40 lpm.   Interim History / Subjective:     Objective   Blood pressure (!) (P) 168/65, pulse 65, temperature 98 F (36.7 C),  temperature source Oral, resp. rate 19, height 5\' 10"  (1.778 m), weight 64.3 kg, SpO2 100%.    FiO2 (%):  [25 %-34 %] 28 %   Intake/Output Summary (Last 24 hours) at 07/03/2023 1256 Last data filed at 07/03/2023 0900 Gross per 24 hour  Intake 160 ml  Output 1150 ml  Net -990 ml   Filed Weights   06/28/23 1219  Weight: 64.3 kg    Examination: General no distress still has exertional dyspnea Pulm wheezing and rhonchi bilaterally. Some rales on left. Still on heated high flow. Speaks slow. Can complete sentences. Pcxr w/ slight increase in patchy interstitial changes on right. 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 Mildly elevated PAS. Small pericardial effusion, RAP estimated at 8 mmHg  Plan Cont Supplemental oxygen for sat goal 92-95% Continue Brovana, Pulmicort and yupelri Day 2 of ceftriaxone and azithromycin for 5 day course PRN albuterol IV solumedrol 40mg  BID (hold here) As needed cough suppressants (ordered)  Encourage pulmonary hygiene  Mobilize as able, up to chair as able Repeat lasix today  Pending VQ scan  Am cxr   Best Practice (right click and "Reselect all SmartList Selections" daily)  Per primary

## 2023-07-03 NOTE — Plan of Care (Signed)

## 2023-07-03 NOTE — Plan of Care (Signed)
  Problem: Education: Goal: Ability to describe self-care measures that may prevent or decrease complications (Diabetes Survival Skills Education) will improve 07/03/2023 0631 by Charmian Muff, RN Outcome: Progressing 07/03/2023 0513 by Charmian Muff, RN Outcome: Progressing Goal: Individualized Educational Video(s) 07/03/2023 0631 by Charmian Muff, RN Outcome: Progressing 07/03/2023 0513 by Charmian Muff, RN Outcome: Progressing   Problem: Coping: Goal: Ability to adjust to condition or change in health will improve 07/03/2023 0631 by Charmian Muff, RN Outcome: Progressing 07/03/2023 0513 by Charmian Muff, RN Outcome: Progressing   Problem: Fluid Volume: Goal: Ability to maintain a balanced intake and output will improve 07/03/2023 0631 by Charmian Muff, RN Outcome: Progressing 07/03/2023 0513 by Charmian Muff, RN Outcome: Progressing

## 2023-07-03 NOTE — Progress Notes (Signed)
 Lower extremity venous duplex completed. Please see CV Procedures for preliminary results.  Shona Simpson, RVT 07/03/23 10:59 AM

## 2023-07-03 NOTE — Progress Notes (Signed)
 Recieved call from dtg stating pt dexcon machine alerted her to blood sugar was 50's rechecked hospital device CBG 134 and dextrose not needed at this time, noticed pt machine alert with CBg 65, then 74. gave pt OJ, as pt was alert and talking without symptons.will cont to monitor as needed. SRP, RN

## 2023-07-04 ENCOUNTER — Inpatient Hospital Stay (HOSPITAL_COMMUNITY)

## 2023-07-04 DIAGNOSIS — N179 Acute kidney failure, unspecified: Secondary | ICD-10-CM | POA: Diagnosis not present

## 2023-07-04 DIAGNOSIS — N400 Enlarged prostate without lower urinary tract symptoms: Secondary | ICD-10-CM | POA: Diagnosis not present

## 2023-07-04 DIAGNOSIS — I10 Essential (primary) hypertension: Secondary | ICD-10-CM | POA: Diagnosis not present

## 2023-07-04 DIAGNOSIS — J9601 Acute respiratory failure with hypoxia: Secondary | ICD-10-CM | POA: Diagnosis not present

## 2023-07-04 LAB — GLUCOSE, CAPILLARY
Glucose-Capillary: 141 mg/dL — ABNORMAL HIGH (ref 70–99)
Glucose-Capillary: 242 mg/dL — ABNORMAL HIGH (ref 70–99)
Glucose-Capillary: 345 mg/dL — ABNORMAL HIGH (ref 70–99)
Glucose-Capillary: 576 mg/dL (ref 70–99)
Glucose-Capillary: 267 mg/dL — ABNORMAL HIGH (ref 70–99)

## 2023-07-04 LAB — CBC WITH DIFFERENTIAL/PLATELET
Abs Immature Granulocytes: 0.1 10*3/uL — ABNORMAL HIGH (ref 0.00–0.07)
Basophils Absolute: 0 10*3/uL (ref 0.0–0.1)
Basophils Relative: 0 %
Eosinophils Absolute: 0 10*3/uL (ref 0.0–0.5)
Eosinophils Relative: 0 %
HCT: 29.3 % — ABNORMAL LOW (ref 39.0–52.0)
Hemoglobin: 9.8 g/dL — ABNORMAL LOW (ref 13.0–17.0)
Immature Granulocytes: 1 %
Lymphocytes Relative: 2 %
Lymphs Abs: 0.2 10*3/uL — ABNORMAL LOW (ref 0.7–4.0)
MCH: 31.7 pg (ref 26.0–34.0)
MCHC: 33.4 g/dL (ref 30.0–36.0)
MCV: 94.8 fL (ref 80.0–100.0)
Monocytes Absolute: 0.1 10*3/uL (ref 0.1–1.0)
Monocytes Relative: 1 %
Neutro Abs: 11.4 10*3/uL — ABNORMAL HIGH (ref 1.7–7.7)
Neutrophils Relative %: 96 %
Platelets: 301 10*3/uL (ref 150–400)
RBC: 3.09 MIL/uL — ABNORMAL LOW (ref 4.22–5.81)
RDW: 15.3 % (ref 11.5–15.5)
WBC: 11.8 10*3/uL — ABNORMAL HIGH (ref 4.0–10.5)
nRBC: 0 % (ref 0.0–0.2)

## 2023-07-04 LAB — COMPREHENSIVE METABOLIC PANEL
ALT: 35 U/L (ref 0–44)
AST: 20 U/L (ref 15–41)
Albumin: 2.2 g/dL — ABNORMAL LOW (ref 3.5–5.0)
Alkaline Phosphatase: 95 U/L (ref 38–126)
Anion gap: 8 (ref 5–15)
BUN: 74 mg/dL — ABNORMAL HIGH (ref 8–23)
CO2: 18 mmol/L — ABNORMAL LOW (ref 22–32)
Calcium: 8.3 mg/dL — ABNORMAL LOW (ref 8.9–10.3)
Chloride: 115 mmol/L — ABNORMAL HIGH (ref 98–111)
Creatinine, Ser: 2.74 mg/dL — ABNORMAL HIGH (ref 0.61–1.24)
GFR, Estimated: 22 mL/min — ABNORMAL LOW (ref >60)
Glucose, Bld: 256 mg/dL — ABNORMAL HIGH (ref 70–99)
Potassium: 4.3 mmol/L (ref 3.5–5.1)
Sodium: 141 mmol/L (ref 135–145)
Total Bilirubin: 0.5 mg/dL (ref 0.0–1.2)
Total Protein: 5.6 g/dL — ABNORMAL LOW (ref 6.5–8.1)

## 2023-07-04 LAB — PHOSPHORUS: Phosphorus: 5.2 mg/dL — ABNORMAL HIGH (ref 2.5–4.6)

## 2023-07-04 LAB — MAGNESIUM: Magnesium: 2.1 mg/dL (ref 1.7–2.4)

## 2023-07-04 MED ORDER — INSULIN GLARGINE-YFGN 100 UNIT/ML ~~LOC~~ SOLN
15.0000 [IU] | Freq: Two times a day (BID) | SUBCUTANEOUS | Status: DC
  Administered 2023-07-04 – 2023-07-07 (×6): 15 [IU] via SUBCUTANEOUS
  Filled 2023-07-04 (×7): qty 0.15

## 2023-07-04 MED ORDER — FUROSEMIDE 10 MG/ML IJ SOLN
40.0000 mg | Freq: Once | INTRAMUSCULAR | Status: AC
  Administered 2023-07-05: 40 mg via INTRAVENOUS

## 2023-07-04 MED ORDER — INSULIN ASPART 100 UNIT/ML IJ SOLN
12.0000 [IU] | Freq: Once | INTRAMUSCULAR | Status: AC
  Administered 2023-07-04: 12 [IU] via SUBCUTANEOUS

## 2023-07-04 MED ORDER — INSULIN GLARGINE 100 UNIT/ML ~~LOC~~ SOLN
15.0000 [IU] | Freq: Two times a day (BID) | SUBCUTANEOUS | Status: DC
  Filled 2023-07-04: qty 0.15

## 2023-07-04 MED ORDER — PREDNISONE 20 MG PO TABS
40.0000 mg | ORAL_TABLET | Freq: Every day | ORAL | Status: DC
  Administered 2023-07-05: 40 mg via ORAL
  Filled 2023-07-04: qty 2

## 2023-07-04 NOTE — Progress Notes (Signed)
 Occupational Therapy Treatment Patient Details Name: Jeffrey Randall. MRN: [id number] DOB: 1940-12-06 Today's Date: 07/04/2023   History of present illness Pt is an 83 year old man admitted on 06/28/23 with acute respiratory failure with hypoxia due to Metapneumonia and COPD exacerbation. PMH: HTN, DM, COPD, lung ca s/p resection and radiation, alcoholism in remission, CKD 3A, gout, HLD, BPH. Pt is a current smoker.   OT comments  Pt remains on HHFNC, 15L, 27% FiO2. Came to EOB without assist, completed seated grooming and stood to march in place. Pt stood without AD, used RW when marching. Educated in energy conservation and pursed lip breathing. Provided written handout to reinforce the 4 P's of energy conservation. Continue to recommend  HHOT.       If plan is discharge home, recommend the following:  A little help with walking and/or transfers;A little help with bathing/dressing/bathroom;Assistance with cooking/housework;Assist for transportation;Help with stairs or ramp for entrance   Equipment Recommendations  Tub/shower seat    Recommendations for Other Services      Precautions / Restrictions Precautions Precautions: Fall Precaution/Restrictions Comments: HHFNC Restrictions Weight Bearing Restrictions Per Provider Order: No       Mobility Bed Mobility Overal bed mobility: Modified Independent                  Transfers Overall transfer level: Needs assistance Equipment used: Rolling walker (2 wheels) Transfers: Sit to/from Stand Sit to Stand: Supervision           General transfer comment: cues for hand placement and assist for lines     Balance Overall balance assessment: Needs assistance   Sitting balance-Leahy Scale: Good       Standing balance-Leahy Scale: Fair Standing balance comment: stood with no AD, marched holding walker                           ADL either performed or assessed with clinical judgement   ADL Overall ADL's  : Needs assistance/impaired Eating/Feeding: Independent;Sitting   Grooming: Wash/dry face;Wash/dry hands;Sitting;Set up                                 General ADL Comments: limited to standing/marching due to [facility], pt declined OOB to chair    Extremity/Trunk Assessment              Vision       Perception     Praxis     Communication Communication Communication: No apparent difficulties   Cognition Arousal: Alert Behavior During Therapy: WFL for tasks assessed/performed Cognition: No apparent impairments                               Following commands: Intact        Cueing      Exercises      Shoulder Instructions       General Comments      Pertinent Vitals/ Pain       Pain Assessment Pain Assessment: No/denies pain  Home Living                                          Prior Functioning/Environment  Frequency  Min 2X/week        Progress Toward Goals  OT Goals(current goals can now be found in the care plan section)  Progress towards OT goals: Progressing toward goals  Acute Rehab OT Goals OT Goal Formulation: With patient Time For Goal Achievement: 07/15/23 Potential to Achieve Goals: Good  Plan      Co-evaluation                 AM-PAC OT "6 Clicks" Daily Activity     Outcome Measure   Help from another person eating meals?: None Help from another person taking care of personal grooming?: A Little Help from another person toileting, which includes using toliet, bedpan, or urinal?: A Little Help from another person bathing (including washing, rinsing, drying)?: A Little Help from another person to put on and taking off regular upper body clothing?: A Little Help from another person to put on and taking off regular lower body clothing?: A Little 6 Click Score: 19    End of Session Equipment Utilized During Treatment: Rolling walker (2 wheels);Oxygen  OT  Visit Diagnosis: Unsteadiness on feet (R26.81);Other (comment);Muscle weakness (generalized) (M62.81) (decreased activity tolerance)   Activity Tolerance Patient tolerated treatment well   Patient Left in bed;with call bell/phone within reach   Nurse Communication Other (comment) (pt's glucose monitor is reading low)        Time: 1914-7829 OT Time Calculation (min): 30 min  Charges: OT General Charges $OT Visit: 1 Visit OT Treatments $Self Care/Home Management : 8-22 mins $Therapeutic Activity: 8-22 mins  Berna Spare, OTR/L Acute Rehabilitation Services Office: [phone]   Evern Bio 07/04/2023, 11:20 AM

## 2023-07-04 NOTE — Inpatient Diabetes Management (Signed)
 Inpatient Diabetes Program Recommendations  AACE/ADA: New Consensus Statement on Inpatient Glycemic Control (2015)  Target Ranges:  Prepandial:   less than 140 mg/dL      Peak postprandial:   less than 180 mg/dL (1-2 hours)      Critically ill patients:  140 - 180 mg/dL   Lab Results  Component Value Date   GLUCAP 242 (H) 07/04/2023   HGBA1C 9.9 (H) 06/29/2023    Review of Glycemic Control  Latest Reference Range & Units 07/03/23 07:27 07/03/23 11:56 07/03/23 15:22 07/03/23 20:59 07/04/23 05:51  Glucose-Capillary 70 - 99 mg/dL 161 (H) 096 (H) 045 (H) 336 (H) 242 (H)   Diabetes history: DM  Outpatient Diabetes medications:  Novolog 70/30 2-14 units tid with meals Farxiga 10 mg daily Current orders for Inpatient glycemic control:  Novolog 6 units tid with meals Lantus 10 units bid  Novolog 0-15 units tid with meals and HS Solumedrol 40 mg IV q 12 hours  Inpatient Diabetes Program Recommendations:    Please consider increasing Lantus to 15 units bid.   Thanks,  Lorenza Cambridge, RN, BC-ADM Inpatient Diabetes Coordinator Pager [phone]  (8a-5p)

## 2023-07-04 NOTE — Progress Notes (Signed)
 Physical Therapy Treatment Patient Details Name: Jeffrey Randall. MRN: [id number] DOB: 1940/09/04 Today's Date: 07/04/2023   History of Present Illness Pt is an 83 year old man admitted on 06/28/23 with acute respiratory failure with hypoxia due to Metapneumonia and COPD exacerbation. PMH: HTN, DM, COPD, lung ca s/p resection and radiation, alcoholism in remission, CKD 3A, gout, HLD, BPH. Pt is a current smoker.    PT Comments  Pt resting in bed on arrival and agreeable to session with good progress towards acute goals. Pt on 5L [place] on arrival with SpO2 99%, pt demonstrating gait for hallway distance with RW for support and supervision for safety with SpO2 >95% on 6L (O2 tank without 5L option). Pt with some DOE, 2/4, requiring cues for pacing and to maintain RW in contact with floor. Plan to trial rollator next session for improved energy conservation. Pt continues to benefit from skilled PT services to progress toward functional mobility goals.     If plan is discharge home, recommend the following: A little help with walking and/or transfers;Assist for transportation;Help with stairs or ramp for entrance   Can travel by private vehicle        Equipment Recommendations   (possibly rollator)    Recommendations for Other Services       Precautions / Restrictions Precautions Precautions: Fall Restrictions Weight Bearing Restrictions Per Provider Order: No     Mobility  Bed Mobility Overal bed mobility: Modified Independent                  Transfers Overall transfer level: Needs assistance Equipment used: Rolling walker (2 wheels) Transfers: Sit to/from Stand Sit to Stand: Supervision           General transfer comment: cues for hand placement and assist for lines    Ambulation/Gait Ambulation/Gait assistance: Supervision Gait Distance (Feet): 200 Feet Assistive device: Rolling walker (2 wheels) Gait Pattern/deviations: Step-through pattern, Decreased stride  length Gait velocity: decr     General Gait Details: steady gait with RW support, cues to keep RW in contact with floor, SpO2 >95% on 6L   Stairs             Wheelchair Mobility     Tilt Bed    Modified Rankin (Stroke Patients Only)       Balance Overall balance assessment: Needs assistance   Sitting balance-Leahy Scale: Good       Standing balance-Leahy Scale: Fair Standing balance comment: benefits from AD support                            Communication Communication Communication: No apparent difficulties  Cognition Arousal: Alert Behavior During Therapy: WFL for tasks assessed/performed                             Following commands: Intact      Cueing Cueing Techniques: Verbal cues  Exercises      General Comments General comments (skin integrity, edema, etc.): VSS on 5L on arrival, utilized 6L for ambulation as tank goes from 4-6L      Pertinent Vitals/Pain Pain Assessment Pain Assessment: No/denies pain    Home Living                          Prior Function            PT Goals (  current goals can now be found in the care plan section) Acute Rehab PT Goals PT Goal Formulation: With patient/family Time For Goal Achievement: 07/16/23 Progress towards PT goals: Progressing toward goals    Frequency    Min 2X/week      PT Plan      Co-evaluation              AM-PAC PT "6 Clicks" Mobility   Outcome Measure  Help needed turning from your back to your side while in a flat bed without using bedrails?: None Help needed moving from lying on your back to sitting on the side of a flat bed without using bedrails?: None Help needed moving to and from a bed to a chair (including a wheelchair)?: A Little Help needed standing up from a chair using your arms (e.g., wheelchair or bedside chair)?: A Little Help needed to walk in hospital room?: A Little Help needed climbing 3-5 steps with a railing? :  A Lot 6 Click Score: 19    End of Session Equipment Utilized During Treatment: Oxygen Activity Tolerance: Patient tolerated treatment well Patient left: in bed;with call bell/phone within reach;with family/visitor present (seated up EOB) Nurse Communication: Mobility status PT Visit Diagnosis: Other abnormalities of gait and mobility (R26.89)     Time: 7829-5621 PT Time Calculation (min) (ACUTE ONLY): 18 min  Charges:    $Gait Training: 8-22 mins PT General Charges $$ ACUTE PT VISIT: 1 Visit                     Tobi Bastos R. PTA Acute Rehabilitation Services Office: [phone]   Catalina Antigua 07/04/2023, 4:13 PM

## 2023-07-04 NOTE — Progress Notes (Signed)
 Mobility Specialist Progress Note:    07/04/23 1337  Mobility  Activity  (bed level exercises)  Level of Assistance Standby assist, set-up cues, supervision of patient - no hands on  Assistive Device None  Activity Response Tolerated well  Mobility Referral Yes  Mobility visit 1 Mobility  Mobility Specialist Start Time (ACUTE ONLY) 0949  Mobility Specialist Stop Time (ACUTE ONLY) 1000  Mobility Specialist Time Calculation (min) (ACUTE ONLY) 11 min   Pt received in bed agreeable to mobility. Pt attempted to sit up but stated he was too fatigue to get OOB. Pt was agreeable to bed level exercises. Performed UE and LE exercises on 15L/min w/o fault. Call bell and personal belongings in reach. All needs met.   Thompson Grayer Mobility Specialist  Please contact vis Secure Chat or  Rehab Office [phone]

## 2023-07-04 NOTE — Inpatient Diabetes Management (Deleted)
 Inpatient Diabetes Program Recommendations  AACE/ADA: New Consensus Statement on Inpatient Glycemic Control (2015)  Target Ranges:  Prepandial:   less than 140 mg/dL      Peak postprandial:   less than 180 mg/dL (1-2 hours)      Critically ill patients:  140 - 180 mg/dL   Lab Results  Component Value Date   GLUCAP 242 (H) 07/04/2023   HGBA1C 9.9 (H) 06/29/2023    Review of Glycemic Control  Latest Reference Range & Units 07/03/23 07:27 07/03/23 11:56 07/03/23 15:22 07/03/23 20:59 07/04/23 05:51  Glucose-Capillary 70 - 99 mg/dL 161 (H) 096 (H) 045 (H) 336 (H) 242 (H)   Diabetes history: DM  Outpatient Diabetes medications:  Novolog 70/30 2-14 units tid with meals Farxiga 10 mg daily Current orders for Inpatient glycemic control:  Novolog 6 units tid with meals Lantus 10 units bid  Novolog 0-15 units tid with meals and HS Solumedrol 40 mg IV q 12 hours  Inpatient Diabetes Program Recommendations:    Please consider increasing Lantus to 15 units bid.   Thanks,  Lorenza Cambridge, RN, BC-ADM Inpatient Diabetes Coordinator Pager [phone]  (8a-5p)

## 2023-07-04 NOTE — Progress Notes (Signed)
 RT placed pt on 5L cannula and pt is tolerating well at this time

## 2023-07-04 NOTE — Progress Notes (Signed)
 NAME:  Jeffrey Randall., MRN:  [id number], DOB:  28-Jan-1941, LOS: 5 ADMISSION DATE:  06/28/2023, CONSULTATION DATE:  06/30/23 REFERRING MD:  Marland Mcalpine, CHIEF COMPLAINT:  SOB   History of Present Illness:  83 yo M PMH COPD, Lung ca s/p radiation, ongoing tobacco use, CKD 3a, HTN, DM who was admitted to [facility] 06/28/23 after presenting to ED w CC progressive SOB. Associated productive coug, worse DOE with only short distances. Minimal improvement w nebs.   CXR 3/18 w L basilar opacity and irregular density in r apex. A CT chest was recommended.  RVP revealed + metapneumovirus  Was started on steroids and breo + incuse in place of home trelegy + [facility] atrovent VQ scan ordered.   Pertinent  Medical History   Past Medical History:  Diagnosis Date   Alcoholism in recovery [facility])    Aortic atherosclerosis (HCC)    Back pain 06/17/2014   Chronic lung disease    CKD (chronic kidney disease)    COPD (chronic obstructive pulmonary disease) (HCC) 07/23/2014   Essential hypertension    Gout    History of lung cancer    History of pancreatitis    Iron deficiency anemia    Lumbar disc disease    Mixed hyperlipidemia    Notalgia    Prostate hypertrophy    Pulmonary nodules 07/23/2014   Swelling    Tobacco use disorder 07/23/2014   Tumor of lung 01/26/2021   Type 2 diabetes mellitus without complication (HCC)    Uncontrolled type 2 diabetes mellitus with hyperglycemia, without long-term current use of insulin (HCC)      Significant Hospital Events: Including procedures, antibiotic start and stop dates in addition to other pertinent events   3/18 presented with SOB, admitted for AECOPD with metapneumovirus  3/19 BD changed to brovana budesonide atrovent PRN albuterol  3/20 PCCM is consulted for dyspnea. SpO2 98-100 on 1.5-2.5L  3/22 increased WOB. Placed on HHFO2 35% 40 lpm.   Interim History / Subjective:  NAEON - seems improved. Sats 100% on 26% FiO2 via HHFNC   Objective   Blood pressure  (!) 164/81, pulse 93, temperature 97.7 F (36.5 C), temperature source Oral, resp. rate 18, height 5\' 10"  (1.778 m), weight 64.3 kg, SpO2 100%.    FiO2 (%):  [28 %] 28 %   Intake/Output Summary (Last 24 hours) at 07/04/2023 1121 Last data filed at 07/04/2023 4098 Gross per 24 hour  Intake 900 ml  Output 1825 ml  Net -925 ml   Filed Weights   06/28/23 1219  Weight: 64.3 kg    Examination: General no distress sitting on edge of bed Pulm diminsihed, distant Card rrr Abd soft Ext no sig edema  Neuro intact   Resolved Hospital Problem list     Assessment & Plan:  Acute hypoxic respiratory failure in the setting of Metapneumovirus and acute exacerbation of COPD w/ prob element of diastolic HF Current daily tobacco use Mild PH by ECHO History of stage 1A lung cancer presumed NSCLC in the right upper lobe with 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 Mildly elevated PAS. Small pericardial effusion, RAP estimated at 8 mmHg  Plan Cont Supplemental oxygen for sat goal 88%  or greater, can stop HHFNC and transition or [place] Continue Brovana, Pulmicort and yupelri, PRN SABA, resume Trelegy on D/C Continue ceftriaxone and azithromycin for 5 day course IV solumedrol 40mg  BID x 72 hours then transition to prednisone 40 mg daily x 5 days tomorrow 3/25 , then 20 mg daily x 5 days, then prednisone 10 mg daily for 5 days and stop As needed cough suppressants (ordered)  Encourage pulmonary hygiene  Mobilize as able, up to chair as able Lasix as tolerated via primary team Pending VQ scan   PCCM will sign off  Best Practice (right click and "Reselect all SmartList Selections" daily)  Per primary     Karren Burly, MD See Loretha Stapler

## 2023-07-04 NOTE — Progress Notes (Signed)
 Pt CBG 576.  MD aware, received orders for 15 units novolog as per SS with an additional 12 units, total of 27 units novolog. Hold dinner meal coverage.  Future dose of long acting insulin has been adjusted.

## 2023-07-04 NOTE — Progress Notes (Signed)
 PROGRESS NOTE    Jeffrey Randall.  ZOX:[id number] DOB: 29-Jul-1940 DOA: 06/28/2023 PCP: Carin Hock, PA   Brief Narrative:  Jeffrey Randall. is a 83 y.o. male with medical history significant of hypertension, diabetes, COPD, lung cancer s/p radiation, alcoholism in remission, CKD 3 A, gout, hyperlipidemia, BPH presenting with worsening shortness of breath. Further workup reveals + Metapneumovirus. Ruling out PE but VQ Scan unable to be done as patient could not lay flat. Nebulized Medications are being further adjusted.    Continued to be dyspneic so he was transferred to the progressive care unit and Pulmonary was consulted and they have made several medication changes and now signed off.  Oxygen requirements are now weaning and now transition from heated high flow back to nasal cannula..  IV morphine and lorazepam were added.  He is now on empiric antibiotics and given a dose of IV Lasix.  Echocardiogram and lower extremity duplex negative.  Will continue diuresis today and  Assessment and Plan:  Acute Respiratory failure with hypoxia in the setting of Metapneumovirus with Acute COPD exacerbation Rule out pulmonary embolism -Respiratory panel is + for Metapneumovirus.  No leukocytosis to indicate bacterial etiology.   -Initial CXR did show irregularity/solid density at the right lung apex recommending possible CT to follow-up.  However, patient had consolidation at this location noted on CT in August with read Care Everywhere.  PET scan was done in follow-up considering patient's history of lung cancer which showed consolidation superimposed on the right upper lobe radiation fibrosis favoring infection versus inflammation but no evidence of recurrence of lung cancer or metastatic disease. -ABG done and showed a pH of 7.28, pCO2 37, pO2 133, HCO3 of 17.4, and a O2 saturation of 100% -Continue with IV Solu-Medrol 40 mg twice daily and they are changing to p.o. prednisone 40 mg p.o. daily x  5 days and then 20 mg for 5 days and then 10 mg daily for 5 days and then stopping -Scheduled Xopenex/Atrovent with PRN  As needed albuterol and now they are adding revefenacin 175 mcg nebs daily -C/w Afromoterol 15 mcg Neb BID and Budesonide 0.25 mg Daily -VQ scan when able to be done when he is able to lie flat and will be attempted on 07/05/2023; ECHO as below and LE duplex Negative for DVT B/L -CXR this AM showed cardiomegaly and increased creasing interstitial edema which was compatible with CHF and as well as asymmetric left-sided airspace disease that may represent asymmetric edema or infection.  Given a dose of IV Lasix 40 mg and 1 yesterday and this is being repeated today by the pulmonary team -C/w Empiric Abx with IV Ceftriaxone and Azithromycin to treat for superimposed bacterial infection for 5 Days; today is day (4/5 of antibiotics) -Appreciated pulmonary evaluation recommendations.  Patient has been weaned from his heated high flow nasal cannula 25 L with 25% via 2 to 5 L now.  Respiratory status is slowly improving.  Pulmonary has signed off but recommends continuing supplemental oxygen for saturation goal of 88% or above and continue Brovana, Pulmicort antiplatelet while hospitalized and resume Trelegy at discharge  Chest pain and discomfort: Happened when he started having worsening breathing.  BNP was 190.9 check troponins x 2 and went from 22 and is now 24.  EKG done showed a normal sinus rhythm with a rate of 70 and a QTc of 442.  Echocardiogram done and showed LVEF of >75% w/ no RWMA, and Normal LVDP. RVSF was normal. Discomfort  has resolved.   AKI on CKD 3A / Metabolic Acidosis:  Creatinine elevated to 2.7 from previous baseline of 1.4 (though this was 2 years ago) Did reportedly have some recent decreased p.o. intake. Received 1 L in the ED. BUN/Cr Trend: Recent Labs  Lab 06/28/23 1335 06/29/23 1019 06/30/23 0814 07/01/23 0332 07/02/23 0259 07/03/23 1018  BUN 44* 46* 57* 67*  74* 73*  CREATININE 2.70* 2.57* 2.63* 2.75* 2.77* 2.73*  -IVF now discontinued; this appears to be likely his new baseline; -Given IV Lasix 40 mg x1 the last few days and will need another 1 dose today -Patient continues have a slight metabolic acidosis he has a CO2 of 18, anion gap of 8, chloride level 115.  C/w Sodium Bicarbonate 650 mg po BID -Avoid Nephrotoxic Medications, Contrast Dyes, Hypotension and Dehydration to Ensure Adequate Renal Perfusion and will need to Renally Adjust Meds. CTM and Trend Renal Function carefully and repeat CMP in the AM    Leukocytosis: Setting of steroid demargination.  Patient's WBC is now trending back down and gone from 15.9 and is 11.8 today.  CTM for signs or symptoms of infection.  Repeat CBC in a.m.  History of Lung Cancer: Status post radiation treatment in 2022.  As above had CT with abnormality last year and follow-up PET scan showed no recurrence of disease. Will need outpt f/u with his cancer doctors.  Pulmonary consulted as above   Essential HTN: C/w Amlodipine 10 mg po Daily.  Hold Valsartan/Hydrochlorothiazide in the setting of AKI. CTM BP per Protocol. Last BP reading was 177/72   Diabetes Mellitus Type 2: Home medication is 70/30 insulin SSI TID. C/w Moderate Novolog SSI AC. CTM for Hyperglycemia with steroids for COPD. Adjust Insulin regimen as necessary. CBGs significantly elevated. Add Lantus 10 units Daily and increased to BID. Start Moderate Novolog SSI q4h and Novolog Meal Coverage 6 units TID if eats 50% or Greater. CBGs ranging from 242-576   Gout: Holding Allopurinol 100 mg po Daily in the setting of AKI   BPH: C/w Finasteride 5 mg po Daily and Tamsulosin 0.8 mg po qHS  Normocytic Anemia: Hgb/Hct stable around 10 and is now 9.8/29.3.  Anemia panel showed an Fe level of 35, UIBC 189, TIBC 224, saturation ratios of 16%, ferritin of 147, folate level 8.6 and vitamin B12 1449 CTM for S/Sx of Bleeding; No overt bleeding noted. Repeat CBC in  the AM   Hyperlipidemia: C/w Simvastatin 10 mg po Daily   Hypoalbuminemia: Albumin Level stable at 2.2. CTM and Trend and repeat CMP in the AM   DVT prophylaxis: heparin injection 5,000 Units Start: 06/28/23 1530    Code Status: Full Code Family Communication: No family currently at bedside  Disposition Plan:  Level of care: Progressive Status is: Inpatient Remains inpatient appropriate because: Needs further clinical improvement in respiratory status.  Pulmonary is now signed off.  PT OT recommending home health   Consultants:  Pulmonary  Procedures:  As delineated as above; ECHOCardiogram  Antimicrobials:  Anti-infectives (From admission, onward)    Start     Dose/Rate Route Frequency Ordered Stop   07/02/23 1500  cefTRIAXone (ROCEPHIN) 2 g in sodium chloride 0.9 % 100 mL IVPB        2 g 200 mL/hr over 30 Minutes Intravenous Every 24 hours 07/02/23 1403 07/07/23 0959   07/02/23 1445  azithromycin (ZITHROMAX) 500 mg in sodium chloride 0.9 % 250 mL IVPB        500 mg 250  mL/hr over 60 Minutes Intravenous Every 24 hours 07/02/23 1349 07/07/23 0959   07/02/23 1445  cefTRIAXone (ROCEPHIN) 1 g in sodium chloride 0.9 % 100 mL IVPB  Status:  Discontinued        1 g 200 mL/hr over 30 Minutes Intravenous Every 24 hours 07/02/23 1349 07/02/23 1403       Subjective: Seen and examined at bedside and he is slowly getting better and thinks he is doing okay.  Denies any nausea or vomiting.  No lightheadedness or dizziness.  Ambulating with therapy.  No other concerns or complaints at this time.  Objective: Vitals:   07/04/23 0800 07/04/23 1150 07/04/23 1157 07/04/23 1635  BP: (!) 164/81  (!) 164/88 (!) 177/72  Pulse: 93  78 85  Resp: 18  20 20   Temp: 97.7 F (36.5 C)  97.8 F (36.6 C) 98.2 F (36.8 C)  TempSrc: Oral  Oral Oral  SpO2: 100% 100% 100% 100%  Weight:      Height:        Intake/Output Summary (Last 24 hours) at 07/04/2023 1801 Last data filed at 07/04/2023  1610 Gross per 24 hour  Intake 820 ml  Output 1625 ml  Net -805 ml   Filed Weights   06/28/23 1219  Weight: 64.3 kg   Examination: Physical Exam:  Constitutional: Thin elderly African-American male who is calm sitting up in the bed on supplemental oxygen via heated high flow nasal cannula Respiratory: Diminished to auscultation bilaterally, no wheezing, rales, rhonchi or crackles. Normal respiratory effort and patient is not tachypenic. No accessory muscle use.  Unlabored breathing and wearing heated high flow nasal cannula Cardiovascular: RRR, no murmurs / rubs / gallops. S1 and S2 auscultated. No extremity edema.  Abdomen: Soft, non-tender, non-distended.. Bowel sounds positive.  GU: Deferred. Musculoskeletal: No clubbing / cyanosis of digits/nails. No joint deformity upper and lower extremities. Good ROM, no contractures. Normal strength and muscle tone.  Skin: No rashes, lesions, ulcers on limited skin evaluation. No induration; Warm and dry.  Neurologic: CN 2-12 grossly intact with no focal deficits.  Romberg sign cerebellar reflexes not assessed.  Psychiatric: Normal judgment and insight. Alert and oriented x 3.   Data Reviewed: I have personally reviewed following labs and imaging studies  CBC: Recent Labs  Lab 07/01/23 0332 07/02/23 0259 07/02/23 1853 07/03/23 1018 07/04/23 0352  WBC 15.0* 13.2* 13.1* 11.6* 11.8*  NEUTROABS 14.1* 12.6* 12.5* 11.1* 11.4*  HGB 9.7* 10.0* 10.1* 10.3* 9.8*  HCT 28.9* 29.8* 31.1* 30.9* 29.3*  MCV 93.5 94.6 96.9 95.1 94.8  PLT 268 259 289 283 301   Basic Metabolic Panel: Recent Labs  Lab 06/30/23 0814 07/01/23 0332 07/02/23 0259 07/03/23 1018 07/04/23 0352  NA 135 138 138 140 141  K 4.0 4.5 4.5 4.9 4.3  CL 110 111 115* 112* 115*  CO2 17* 18* 17* 17* 18*  GLUCOSE 318* 196* 135* 268* 256*  BUN 57* 67* 74* 73* 74*  CREATININE 2.63* 2.75* 2.77* 2.73* 2.74*  CALCIUM 8.7* 8.7* 8.1* 8.3* 8.3*  MG 2.2 2.2 2.1 2.1 2.1  PHOS 4.5 4.5  4.5 5.7* 5.2*   GFR: Estimated Creatinine Clearance: 18.6 mL/min (A) (by C-G formula based on SCr of 2.74 mg/dL (H)). Liver Function Tests: Recent Labs  Lab 06/30/23 0814 07/01/23 0332 07/02/23 0259 07/03/23 1018 07/04/23 0352  AST 17 23 26 26 20   ALT 26 25 30  32 35  ALKPHOS 123 102 101 94 95  BILITOT 0.2 0.3 0.5 0.5 0.5  PROT 5.7* 5.8* 5.5* 5.8* 5.6*  ALBUMIN 2.1* 2.1* 2.1* 2.2* 2.2*   No results for input(s): "LIPASE", "AMYLASE" in the last 168 hours. No results for input(s): "AMMONIA" in the last 168 hours. Coagulation Profile: No results for input(s): "INR", "PROTIME" in the last 168 hours. Cardiac Enzymes: No results for input(s): "CKTOTAL", "CKMB", "CKMBINDEX", "TROPONINI" in the last 168 hours. BNP (last 3 results) No results for input(s): "PROBNP" in the last 8760 hours. HbA1C: No results for input(s): "HGBA1C" in the last 72 hours. CBG: Recent Labs  Lab 07/03/23 1522 07/03/23 2059 07/04/23 0551 07/04/23 1152 07/04/23 1634  GLUCAP 245* 336* 242* 345* 576*   Lipid Profile: No results for input(s): "CHOL", "HDL", "LDLCALC", "TRIG", "CHOLHDL", "LDLDIRECT" in the last 72 hours. Thyroid Function Tests: No results for input(s): "TSH", "T4TOTAL", "FREET4", "T3FREE", "THYROIDAB" in the last 72 hours. Anemia Panel: No results for input(s): "VITAMINB12", "FOLATE", "FERRITIN", "TIBC", "IRON", "RETICCTPCT" in the last 72 hours. Sepsis Labs: No results for input(s): "PROCALCITON", "LATICACIDVEN" in the last 168 hours.  Recent Results (from the past 240 hours)  Resp panel by RT-PCR (RSV, Flu A&B, Covid) Anterior Nasal Swab     Status: None   Collection Time: 06/28/23 12:27 PM   Specimen: Anterior Nasal Swab  Result Value Ref Range Status   SARS Coronavirus 2 by RT PCR NEGATIVE NEGATIVE Final   Influenza A by PCR NEGATIVE NEGATIVE Final   Influenza B by PCR NEGATIVE NEGATIVE Final    Comment: (NOTE) The Xpert Xpress SARS-CoV-2/FLU/RSV plus assay is intended as an  aid in the diagnosis of influenza from Nasopharyngeal swab specimens and should not be used as a sole basis for treatment. Nasal washings and aspirates are unacceptable for Xpert Xpress SARS-CoV-2/FLU/RSV testing.  Fact Sheet for Patients: BloggerCourse.com  Fact Sheet for Healthcare Providers: SeriousBroker.it  This test is not yet approved or cleared by the [place] FDA and has been authorized for detection and/or diagnosis of SARS-CoV-2 by FDA under an Emergency Use Authorization (EUA). This EUA will remain in effect (meaning this test can be used) for the duration of the COVID-19 declaration under Section 564(b)(1) of the Act, 21 U.S.C. section 360bbb-3(b)(1), unless the authorization is terminated or revoked.     Resp Syncytial Virus by PCR NEGATIVE NEGATIVE Final    Comment: (NOTE) Fact Sheet for Patients: BloggerCourse.com  Fact Sheet for Healthcare Providers: SeriousBroker.it  This test is not yet approved or cleared by the [place] FDA and has been authorized for detection and/or diagnosis of SARS-CoV-2 by FDA under an Emergency Use Authorization (EUA). This EUA will remain in effect (meaning this test can be used) for the duration of the COVID-19 declaration under Section 564(b)(1) of the Act, 21 U.S.C. section 360bbb-3(b)(1), unless the authorization is terminated or revoked.  Performed at [facility], [address] [place], [place] [address]   Respiratory (~20 pathogens) panel by PCR     Status: Abnormal   Collection Time: 06/28/23  3:52 PM   Specimen: Nasopharyngeal Swab; Respiratory  Result Value Ref Range Status   Adenovirus NOT DETECTED NOT DETECTED Final   Coronavirus 229E NOT DETECTED NOT DETECTED Final    Comment: (NOTE) The Coronavirus on the Respiratory Panel, DOES NOT test for the novel  Coronavirus (2019 nCoV)    Coronavirus HKU1  NOT DETECTED NOT DETECTED Final   Coronavirus NL63 NOT DETECTED NOT DETECTED Final   Coronavirus OC43 NOT DETECTED NOT DETECTED Final   Metapneumovirus DETECTED (A) NOT DETECTED Final  Rhinovirus / Enterovirus NOT DETECTED NOT DETECTED Final   Influenza A NOT DETECTED NOT DETECTED Final   Influenza B NOT DETECTED NOT DETECTED Final   Parainfluenza Virus 1 NOT DETECTED NOT DETECTED Final   Parainfluenza Virus 2 NOT DETECTED NOT DETECTED Final   Parainfluenza Virus 3 NOT DETECTED NOT DETECTED Final   Parainfluenza Virus 4 NOT DETECTED NOT DETECTED Final   Respiratory Syncytial Virus NOT DETECTED NOT DETECTED Final   Bordetella pertussis NOT DETECTED NOT DETECTED Final   Bordetella Parapertussis NOT DETECTED NOT DETECTED Final   Chlamydophila pneumoniae NOT DETECTED NOT DETECTED Final   Mycoplasma pneumoniae NOT DETECTED NOT DETECTED Final    Comment: Performed at [facility], [address], [place], [place] [address]    Radiology Studies: DG Chest Port 1 View Result Date: 07/04/2023 CLINICAL DATA:  [id number] with respiratory failure. EXAM: PORTABLE CHEST 1 VIEW COMPARISON:  Portable chest yesterday at 10:22 a.m. FINDINGS: 5:35 a.m. Emphysematous and scarring changes of both lungs are again noted. No active infiltrate is seen. Heart size and vasculature are normal apart from aortic atherosclerosis. Thoracic spondylosis with no new osseous findings. Compare: Unchanged. IMPRESSION: No evidence of acute chest disease. Emphysematous and scarring changes. Aortic atherosclerosis. Electronically Signed   By: Almira Bar M.D.   On: 07/04/2023 07:47   VAS [place] LOWER EXTREMITY VENOUS (DVT) Result Date: 07/03/2023  Lower Venous DVT Study Patient Name:  Jeffrey Randall.  Date of Exam:   07/03/2023 Medical Rec #: [id number]            Accession #:    [id number] Date of Birth: [birth year]             Patient Gender: M Patient Age:   44 years Exam Location:  [facility] Procedure:      VAS [place] LOWER  EXTREMITY VENOUS (DVT) Referring Phys: Marguerita Merles --------------------------------------------------------------------------------  Indications: Pain.  Risk Factors: Cancer Lung. Comparison Study: None. Performing Technologist: Shona Simpson  Examination Guidelines: A complete evaluation includes B-mode imaging, spectral Doppler, color Doppler, and power Doppler as needed of all accessible portions of each vessel. Bilateral testing is considered an integral part of a complete examination. Limited examinations for reoccurring indications may be performed as noted. The reflux portion of the exam is performed with the patient in reverse Trendelenburg.  +---------+---------------+---------+-----------+----------+--------------+ RIGHT    CompressibilityPhasicitySpontaneityPropertiesThrombus Aging +---------+---------------+---------+-----------+----------+--------------+ CFV      Full           Yes      Yes                                 +---------+---------------+---------+-----------+----------+--------------+ SFJ      Full                                                        +---------+---------------+---------+-----------+----------+--------------+ FV Prox  Full                                                        +---------+---------------+---------+-----------+----------+--------------+ FV Mid   Full                                                        +---------+---------------+---------+-----------+----------+--------------+  FV DistalFull                                                        +---------+---------------+---------+-----------+----------+--------------+ PFV      Full                                                        +---------+---------------+---------+-----------+----------+--------------+ POP      Full           Yes      Yes                                  +---------+---------------+---------+-----------+----------+--------------+ PTV      Full                                                        +---------+---------------+---------+-----------+----------+--------------+ PERO     Full                                                        +---------+---------------+---------+-----------+----------+--------------+   +---------+---------------+---------+-----------+----------+--------------+ LEFT     CompressibilityPhasicitySpontaneityPropertiesThrombus Aging +---------+---------------+---------+-----------+----------+--------------+ CFV      Full           Yes      Yes                                 +---------+---------------+---------+-----------+----------+--------------+ SFJ      Full                                                        +---------+---------------+---------+-----------+----------+--------------+ FV Prox  Full                                                        +---------+---------------+---------+-----------+----------+--------------+ FV Mid   Full                                                        +---------+---------------+---------+-----------+----------+--------------+ FV DistalFull                                                        +---------+---------------+---------+-----------+----------+--------------+  PFV      Full                                                        +---------+---------------+---------+-----------+----------+--------------+ POP      Full           Yes      Yes                                 +---------+---------------+---------+-----------+----------+--------------+ PTV      Full                                                        +---------+---------------+---------+-----------+----------+--------------+ PERO     Full                                                         +---------+---------------+---------+-----------+----------+--------------+     Summary: BILATERAL: - No evidence of deep vein thrombosis seen in the lower extremities, bilaterally. -No evidence of popliteal cyst, bilaterally.   *See table(s) above for measurements and observations. Electronically signed by Lemar Livings MD on 07/03/2023 at 5:40:38 PM.    Final    DG CHEST PORT 1 VIEW Result Date: 07/03/2023 CLINICAL DATA:  Shortness of breath. EXAM: PORTABLE CHEST 1 VIEW COMPARISON:  07/02/2023 and CT chest 09/09/2020. FINDINGS: Patient is rotated. Trachea is midline. Heart size normal. Thoracic aorta is calcified. Scarring in the apical right upper lobe. Additional scarring in the left midlung zone. No airspace consolidation or pleural fluid. Chronic blunting of the left costophrenic angle. Left apical pleural thickening. IMPRESSION: No acute findings. Electronically Signed   By: Leanna Battles M.D.   On: 07/03/2023 14:01   Scheduled Meds:  amLODipine  10 mg Oral Daily   arformoterol  15 mcg Nebulization BID   budesonide (PULMICORT) nebulizer solution  0.5 mg Nebulization BID   feeding supplement  237 mL Oral BID BM   finasteride  5 mg Oral Daily   furosemide  40 mg Intravenous Once   guaiFENesin  1,200 mg Oral BID   heparin  5,000 Units Subcutaneous Q8H   insulin aspart  0-15 Units Subcutaneous TID WC   insulin aspart  0-5 Units Subcutaneous QHS   insulin aspart  6 Units Subcutaneous TID WC   insulin glargine-yfgn  15 Units Subcutaneous BID   levalbuterol  0.63 mg Nebulization Q6H   methylPREDNISolone (SOLU-MEDROL) injection  40 mg Intravenous Q12H   nicotine  21 mg Transdermal Daily   [START ON 07/05/2023] predniSONE  40 mg Oral Q breakfast   revefenacin  175 mcg Nebulization Daily   simvastatin  10 mg Oral QHS   sodium bicarbonate  650 mg Oral BID   sodium chloride flush  3 mL Intravenous Q12H   tamsulosin  0.8 mg Oral QHS   Continuous Infusions:  azithromycin 500 mg (07/04/23 0840)    cefTRIAXone (ROCEPHIN)  IV 2 g (07/04/23 1131)  LOS: 5 days   Marguerita Merles, DO [facility] Available via Epic secure chat 7am-7pm After these hours, please refer to coverage provider listed on [email or web address] 07/04/2023, 6:01 PM

## 2023-07-05 ENCOUNTER — Inpatient Hospital Stay (HOSPITAL_COMMUNITY)

## 2023-07-05 DIAGNOSIS — I10 Essential (primary) hypertension: Secondary | ICD-10-CM | POA: Diagnosis not present

## 2023-07-05 DIAGNOSIS — N400 Enlarged prostate without lower urinary tract symptoms: Secondary | ICD-10-CM | POA: Diagnosis not present

## 2023-07-05 DIAGNOSIS — J9601 Acute respiratory failure with hypoxia: Secondary | ICD-10-CM | POA: Diagnosis not present

## 2023-07-05 DIAGNOSIS — N179 Acute kidney failure, unspecified: Secondary | ICD-10-CM | POA: Diagnosis not present

## 2023-07-05 LAB — GLUCOSE, CAPILLARY
Glucose-Capillary: 213 mg/dL — ABNORMAL HIGH (ref 70–99)
Glucose-Capillary: 306 mg/dL — ABNORMAL HIGH (ref 70–99)
Glucose-Capillary: 309 mg/dL — ABNORMAL HIGH (ref 70–99)
Glucose-Capillary: 301 mg/dL — ABNORMAL HIGH (ref 70–99)

## 2023-07-05 LAB — CBC WITH DIFFERENTIAL/PLATELET
Abs Immature Granulocytes: 0.16 10*3/uL — ABNORMAL HIGH (ref 0.00–0.07)
Basophils Absolute: 0 10*3/uL (ref 0.0–0.1)
Basophils Relative: 0 %
Eosinophils Absolute: 0 10*3/uL (ref 0.0–0.5)
Eosinophils Relative: 0 %
HCT: 29.3 % — ABNORMAL LOW (ref 39.0–52.0)
Hemoglobin: 9.8 g/dL — ABNORMAL LOW (ref 13.0–17.0)
Immature Granulocytes: 1 %
Lymphocytes Relative: 2 %
Lymphs Abs: 0.2 10*3/uL — ABNORMAL LOW (ref 0.7–4.0)
MCH: 31.3 pg (ref 26.0–34.0)
MCHC: 33.4 g/dL (ref 30.0–36.0)
MCV: 93.6 fL (ref 80.0–100.0)
Monocytes Absolute: 0.2 10*3/uL (ref 0.1–1.0)
Monocytes Relative: 2 %
Neutro Abs: 10.5 10*3/uL — ABNORMAL HIGH (ref 1.7–7.7)
Neutrophils Relative %: 95 %
Platelets: 311 10*3/uL (ref 150–400)
RBC: 3.13 MIL/uL — ABNORMAL LOW (ref 4.22–5.81)
RDW: 15.4 % (ref 11.5–15.5)
WBC: 11.1 10*3/uL — ABNORMAL HIGH (ref 4.0–10.5)
nRBC: 0 % (ref 0.0–0.2)

## 2023-07-05 LAB — COMPREHENSIVE METABOLIC PANEL
ALT: 47 U/L — ABNORMAL HIGH (ref 0–44)
AST: 29 U/L (ref 15–41)
Albumin: 2.1 g/dL — ABNORMAL LOW (ref 3.5–5.0)
Alkaline Phosphatase: 94 U/L (ref 38–126)
Anion gap: 7 (ref 5–15)
BUN: 68 mg/dL — ABNORMAL HIGH (ref 8–23)
CO2: 19 mmol/L — ABNORMAL LOW (ref 22–32)
Calcium: 8.3 mg/dL — ABNORMAL LOW (ref 8.9–10.3)
Chloride: 117 mmol/L — ABNORMAL HIGH (ref 98–111)
Creatinine, Ser: 2.72 mg/dL — ABNORMAL HIGH (ref 0.61–1.24)
GFR, Estimated: 22 mL/min — ABNORMAL LOW (ref >60)
Glucose, Bld: 245 mg/dL — ABNORMAL HIGH (ref 70–99)
Potassium: 4.4 mmol/L (ref 3.5–5.1)
Sodium: 143 mmol/L (ref 135–145)
Total Bilirubin: 0.6 mg/dL (ref 0.0–1.2)
Total Protein: 5.2 g/dL — ABNORMAL LOW (ref 6.5–8.1)

## 2023-07-05 LAB — MAGNESIUM: Magnesium: 2.1 mg/dL (ref 1.7–2.4)

## 2023-07-05 LAB — PHOSPHORUS: Phosphorus: 4.6 mg/dL (ref 2.5–4.6)

## 2023-07-05 MED ORDER — PREDNISONE 20 MG PO TABS: 20.0000 mg | ORAL_TABLET | Freq: Every day | ORAL | Status: DC

## 2023-07-05 MED ORDER — FUROSEMIDE 10 MG/ML IJ SOLN
40.0000 mg | Freq: Once | INTRAMUSCULAR | Status: DC
  Filled 2023-07-05: qty 4

## 2023-07-05 MED ORDER — INSULIN ASPART 100 UNIT/ML IJ SOLN
8.0000 [IU] | Freq: Three times a day (TID) | INTRAMUSCULAR | Status: DC
  Administered 2023-07-06 – 2023-07-07 (×6): 8 [IU] via SUBCUTANEOUS

## 2023-07-05 MED ORDER — PREDNISONE 10 MG PO TABS: 10.0000 mg | ORAL_TABLET | Freq: Every day | ORAL | Status: DC

## 2023-07-05 MED ORDER — PREDNISONE 20 MG PO TABS
40.0000 mg | ORAL_TABLET | Freq: Every day | ORAL | Status: DC
  Administered 2023-07-06 – 2023-07-07 (×2): 40 mg via ORAL
  Filled 2023-07-05 (×2): qty 2

## 2023-07-05 NOTE — Plan of Care (Signed)
  Problem: Education: Goal: Knowledge of General Education information will improve Description: Including pain rating scale, medication(s)/side effects and non-pharmacologic comfort measures Outcome: Progressing   Problem: Clinical Measurements: Goal: Ability to maintain clinical measurements within normal limits will improve Outcome: Progressing Goal: Respiratory complications will improve Outcome: Progressing   Problem: Pain Managment: Goal: General experience of comfort will improve and/or be controlled Outcome: Progressing   Problem: Clinical Measurements: Goal: Will remain free from infection Outcome: Not Progressing

## 2023-07-05 NOTE — Plan of Care (Signed)
   Problem: Nutritional: Goal: Maintenance of adequate nutrition will improve Outcome: Progressing   Problem: Education: Goal: Knowledge of General Education information will improve Description: Including pain rating scale, medication(s)/side effects and non-pharmacologic comfort measures Outcome: Progressing

## 2023-07-05 NOTE — Progress Notes (Signed)
 PROGRESS NOTE    Jeffrey Randall.  WUJ:[id number] DOB: 13-Jun-1940 DOA: 06/28/2023 PCP: Carin Hock, PA   Brief Narrative:  Jeffrey Hazelrigg. is a 83 y.o. male with medical history significant of hypertension, diabetes, COPD, lung cancer s/p radiation, alcoholism in remission, CKD 3 A, gout, hyperlipidemia, BPH presenting with worsening shortness of breath. Further workup reveals + Metapneumovirus.   Continued to be dyspneic w/ worsening O2 Requirement so he was transferred to the progressive care unit and Pulmonary was consulted. They have made several medication changes and now signed off 3/24.  Oxygen requirements are now weaning and now transitioned from [facility] back to [facility] [place]. IV morphine and lorazepam were added for dyspnea and anxiety. He is now completing Empiric Antibiotics and will give another a dose of IV Lasix today.  Echocardiogram and lower extremity duplex negative. Pt supposed to be Getting V/Q Scan to evaluate for PE given hx of Cancer as he was not able to tolerate it initially and unfortunately not done today.    Will need an Ambulatory Home O2 Screen Prior to D/C as he is getting close to being transitioned to Home with Home Health.   Assessment and Plan:  Acute Respiratory failure with hypoxia in the setting of Metapneumovirus with Acute COPD Exacerbation  Rule out pulmonary embolism -Respiratory panel is + for Metapneumovirus.   -ABG done and showed a pH of 7.28, pCO2 37, pO2 133, HCO3 of 17.4, and a O2 saturation of 100% -Status post IV steroids and now is on po Prednisone 40 mg p.o. daily x 5 days and then 20 mg for 5 days and then 10 mg daily for 5 days and then stopping -Scheduled Xopenex/Atrovent with PRN  As needed albuterol and now they are adding Revefenacin 175 mcg nebs daily -C/w Afromoterol 15 mcg Neb BID and Budesonide 0.25 mg Daily -VQ scan when able to be done when he is able to lie flat and unfortunately not done today and will be attempted tomorrow; ECHO  as below and LE duplex Negative for DVT B/L -Completing Empiric Abx with IV Ceftriaxone and Azithromycin; Today is day (5/5 of antibiotics); Will give another 1x dose of IV Lasix 40 mg  -Patient has been weaned from [facility] 25 L with 25% and now on 4 Liters.  Respiratory status is slowly improving.  Pulmonary has signed off but Reccs: C/w supplemental oxygen for saturation goal of 88% or above, continue Brovana, Pulmicort, Yupleri while hospitalized and resume Trelegy at discharge; Also recommending following on V/Q Scan -CXR this AM showed no active disease and emphysema and scarring. Will need an ambulatory home O2 screen for discharge as patient is not on supplemental oxygen at home.  PT OT recommending home health  Chest pain and discomfort: Happened when he started having worsening breathing and is now improved.  BNP was 190.9 check troponins x 2 and went from 22 and is now 24.  EKG done showed a normal sinus rhythm with a rate of 70 and a QTc of 442.  Echocardiogram done and showed LVEF of >75% w/ no RWMA, and Normal LVDP. RVSF was normal.    AKI on CKD 3A / Metabolic Acidosis:  Creatinine elevated to 2.7 from previous baseline of 1.4 (though this was 2 years ago) Did reportedly have some recent decreased p.o. intake. Received 1 L in the ED and will get some IV.  This is now stopped and he was initiated on diuresis.. BUN/Cr Trend relatively stable with diuresis and is  now 68/2.72 and appears to be around the new baseline -S/p IV Lasix Daily and his Respiratory Status continues to Improve and oxygen requirements continue to wean and is now down to 4 L..  -Patient continues have a slight metabolic acidosis he has a CO2 of 19, anion gap of 7, chloride level 117.  C/w Sodium Bicarbonate 650 mg po BID -Avoid Nephrotoxic Medications, Contrast Dyes, Hypotension and Dehydration to Ensure Adequate Renal Perfusion and will need to Renally Adjust Meds. CTM and Trend Renal Function carefully and repeat CMP in the  AM    Leukocytosis: In the Setting of steroid demargination.  Patient's WBC is now trending back down and gone from 15.9 and is 11.1 today.  CTM for signs or symptoms of infection.  Repeat CBC in a.m.  History of Lung Cancer: Status post radiation treatment in 2022.  As above had CT with abnormality last year and follow-up PET scan showed no recurrence of disease. Will need outpt f/u with his cancer doctors.  Pulmonary consulted as above   Essential HTN: C/w Amlodipine 10 mg po Daily. Currently holding Valsartan/Hydrochlorothiazide in the setting of AKI. CTM BP per Protocol. Last BP reading was 162/70   Diabetes Mellitus Type 2: Home medication is 70/30 insulin SSI TID. C/w Moderate Novolog SSI AC. CTM for Hyperglycemia with steroids for COPD. Adjust Insulin regimen as necessary. CBGs significantly elevated. Add Lantus 10 units Daily and increased to BID. Start Moderate Novolog SSI q4h and Novolog Meal Coverage 6 units TID if eats 50% or Greater. CBGs ranging from 213-309 the last 3 checks   Gout: Holding Allopurinol 100 mg po Daily in the setting of AKI   BPH: C/w Finasteride 5 mg po Daily and Tamsulosin 0.8 mg po qHS  Normocytic Anemia: Hgb/Hct stable around 10 and is now 9.8/29.3.  Anemia panel showed an Fe level of 35, UIBC 189, TIBC 224, saturation ratios of 16%, ferritin of 147, folate level 8.6 and vitamin B12 1449. CTM for S/Sx of Bleeding; No overt bleeding noted. Repeat CBC in the AM   Hyperlipidemia: C/w Simvastatin 10 mg po Daily   Hypoalbuminemia: Albumin Level stable at 2.1. CTM and Trend and repeat CMP in the AM   DVT prophylaxis: heparin injection 5,000 Units Start: 06/28/23 1530    Code Status: Full Code Family Communication: No family currently at bedside  Disposition Plan:  Level of care: Progressive Status is: Inpatient Remains inpatient appropriate because: Getting another dose of IV Lasix. Awaiting V/Q Scan and Anticipating D/C in the next 24-48 hours w/ Home  Health  Consultants:  Pulmonary  Procedures:  As delineated as above; Echocardiogram  Antimicrobials:  Anti-infectives (From admission, onward)    Start     Dose/Rate Route Frequency Ordered Stop   07/02/23 1500  cefTRIAXone (ROCEPHIN) 2 g in sodium chloride 0.9 % 100 mL IVPB        2 g 200 mL/hr over 30 Minutes Intravenous Every 24 hours 07/02/23 1403 07/07/23 0959   07/02/23 1445  azithromycin (ZITHROMAX) 500 mg in sodium chloride 0.9 % 250 mL IVPB        500 mg 250 mL/hr over 60 Minutes Intravenous Every 24 hours 07/02/23 1349 07/07/23 0959   07/02/23 1445  cefTRIAXone (ROCEPHIN) 1 g in sodium chloride 0.9 % 100 mL IVPB  Status:  Discontinued        1 g 200 mL/hr over 30 Minutes Intravenous Every 24 hours 07/02/23 1349 07/02/23 1403       Subjective: Seen  and examined at bedside and was sitting in the chair and states he still feels short of breath but is slowly improving.  Thinks he can tolerate the VQ scan today.  No nausea or vomiting.  Feels okay.  No other concerns or complaints at this time.  Objective: Vitals:   07/05/23 0800 07/05/23 1100 07/05/23 1109 07/05/23 1500  BP:   (!) 167/68 (!) 162/70  Pulse: 81 80 82 80  Resp: 20 16 (!) 25 (!) 23  Temp:   98 F (36.7 C) 98.6 F (37 C)  TempSrc:   Oral Oral  SpO2: 100% 100% 100% 99%  Weight:      Height:        Intake/Output Summary (Last 24 hours) at 07/05/2023 1744 Last data filed at 07/05/2023 1600 Gross per 24 hour  Intake 1540 ml  Output 575 ml  Net 965 ml   Filed Weights   06/28/23 1219  Weight: 64.3 kg   Examination: Physical Exam:  Constitutional: Thin elderly chronically ill-appearing African-American male who is in chair at bedside and now back on supplemental oxygen via nasal cannula Respiratory: Diminished to auscultation bilaterally with some coarse breath sounds and does still have some expiratory wheezing and some slight crackles but no appreciable rhonchi or rales.  Wearing supplemental  oxygen via nasal cannula now Cardiovascular: RRR, no murmurs / rubs / gallops. S1 and S2 auscultated. No extremity edema.  Abdomen: Soft, non-tender, non-distended. Bowel sounds positive.  GU: Deferred. Musculoskeletal: No clubbing / cyanosis of digits/nails. No joint deformity upper and lower extremities.  Skin: No rashes, lesions, ulcers on limited skin evaluation. No induration; Warm and dry.  Neurologic: CN 2-12 grossly intact with no focal deficits. Romberg sign cerebellar reflexes not assessed.  Psychiatric: Normal judgment and insight. Alert and oriented x 3. Normal mood and appropriate affect.   Data Reviewed: I have personally reviewed following labs and imaging studies  CBC: Recent Labs  Lab 07/02/23 0259 07/02/23 1853 07/03/23 1018 07/04/23 0352 07/05/23 0335  WBC 13.2* 13.1* 11.6* 11.8* 11.1*  NEUTROABS 12.6* 12.5* 11.1* 11.4* 10.5*  HGB 10.0* 10.1* 10.3* 9.8* 9.8*  HCT 29.8* 31.1* 30.9* 29.3* 29.3*  MCV 94.6 96.9 95.1 94.8 93.6  PLT 259 289 283 301 311   Basic Metabolic Panel: Recent Labs  Lab 07/01/23 0332 07/02/23 0259 07/03/23 1018 07/04/23 0352 07/05/23 0335  NA 138 138 140 141 143  K 4.5 4.5 4.9 4.3 4.4  CL 111 115* 112* 115* 117*  CO2 18* 17* 17* 18* 19*  GLUCOSE 196* 135* 268* 256* 245*  BUN 67* 74* 73* 74* 68*  CREATININE 2.75* 2.77* 2.73* 2.74* 2.72*  CALCIUM 8.7* 8.1* 8.3* 8.3* 8.3*  MG 2.2 2.1 2.1 2.1 2.1  PHOS 4.5 4.5 5.7* 5.2* 4.6   GFR: Estimated Creatinine Clearance: 18.7 mL/min (A) (by C-G formula based on SCr of 2.72 mg/dL (H)). Liver Function Tests: Recent Labs  Lab 07/01/23 0332 07/02/23 0259 07/03/23 1018 07/04/23 0352 07/05/23 0335  AST 23 26 26 20 29   ALT 25 30 32 35 47*  ALKPHOS 102 101 94 95 94  BILITOT 0.3 0.5 0.5 0.5 0.6  PROT 5.8* 5.5* 5.8* 5.6* 5.2*  ALBUMIN 2.1* 2.1* 2.2* 2.2* 2.1*   No results for input(s): "LIPASE", "AMYLASE" in the last 168 hours. No results for input(s): "AMMONIA" in the last 168  hours. Coagulation Profile: No results for input(s): "INR", "PROTIME" in the last 168 hours. Cardiac Enzymes: No results for input(s): "CKTOTAL", "CKMB", "CKMBINDEX", "TROPONINI"  in the last 168 hours. BNP (last 3 results) No results for input(s): "PROBNP" in the last 8760 hours. HbA1C: No results for input(s): "HGBA1C" in the last 72 hours. CBG: Recent Labs  Lab 07/04/23 1634 07/04/23 2201 07/05/23 0546 07/05/23 1111 07/05/23 1604  GLUCAP 576* 267* 213* 306* 309*   Lipid Profile: No results for input(s): "CHOL", "HDL", "LDLCALC", "TRIG", "CHOLHDL", "LDLDIRECT" in the last 72 hours. Thyroid Function Tests: No results for input(s): "TSH", "T4TOTAL", "FREET4", "T3FREE", "THYROIDAB" in the last 72 hours. Anemia Panel: No results for input(s): "VITAMINB12", "FOLATE", "FERRITIN", "TIBC", "IRON", "RETICCTPCT" in the last 72 hours. Sepsis Labs: No results for input(s): "PROCALCITON", "LATICACIDVEN" in the last 168 hours.  Recent Results (from the past 240 hours)  Resp panel by RT-PCR (RSV, Flu A&B, Covid) Anterior Nasal Swab     Status: None   Collection Time: 06/28/23 12:27 PM   Specimen: Anterior Nasal Swab  Result Value Ref Range Status   SARS Coronavirus 2 by RT PCR NEGATIVE NEGATIVE Final   Influenza A by PCR NEGATIVE NEGATIVE Final   Influenza B by PCR NEGATIVE NEGATIVE Final    Comment: (NOTE) The Xpert Xpress SARS-CoV-2/FLU/RSV plus assay is intended as an aid in the diagnosis of influenza from Nasopharyngeal swab specimens and should not be used as a sole basis for treatment. Nasal washings and aspirates are unacceptable for Xpert Xpress SARS-CoV-2/FLU/RSV testing.  Fact Sheet for Patients: BloggerCourse.com  Fact Sheet for Healthcare Providers: SeriousBroker.it  This test is not yet approved or cleared by the [place] FDA and has been authorized for detection and/or diagnosis of SARS-CoV-2 by FDA under an  Emergency Use Authorization (EUA). This EUA will remain in effect (meaning this test can be used) for the duration of the COVID-19 declaration under Section 564(b)(1) of the Act, 21 U.S.C. section 360bbb-3(b)(1), unless the authorization is terminated or revoked.     Resp Syncytial Virus by PCR NEGATIVE NEGATIVE Final    Comment: (NOTE) Fact Sheet for Patients: BloggerCourse.com  Fact Sheet for Healthcare Providers: SeriousBroker.it  This test is not yet approved or cleared by the [place] FDA and has been authorized for detection and/or diagnosis of SARS-CoV-2 by FDA under an Emergency Use Authorization (EUA). This EUA will remain in effect (meaning this test can be used) for the duration of the COVID-19 declaration under Section 564(b)(1) of the Act, 21 U.S.C. section 360bbb-3(b)(1), unless the authorization is terminated or revoked.  Performed at [facility], [address] [place], [place] [address]   Respiratory (~20 pathogens) panel by PCR     Status: Abnormal   Collection Time: 06/28/23  3:52 PM   Specimen: Nasopharyngeal Swab; Respiratory  Result Value Ref Range Status   Adenovirus NOT DETECTED NOT DETECTED Final   Coronavirus 229E NOT DETECTED NOT DETECTED Final    Comment: (NOTE) The Coronavirus on the Respiratory Panel, DOES NOT test for the novel  Coronavirus (2019 nCoV)    Coronavirus HKU1 NOT DETECTED NOT DETECTED Final   Coronavirus NL63 NOT DETECTED NOT DETECTED Final   Coronavirus OC43 NOT DETECTED NOT DETECTED Final   Metapneumovirus DETECTED (A) NOT DETECTED Final   Rhinovirus / Enterovirus NOT DETECTED NOT DETECTED Final   Influenza A NOT DETECTED NOT DETECTED Final   Influenza B NOT DETECTED NOT DETECTED Final   Parainfluenza Virus 1 NOT DETECTED NOT DETECTED Final   Parainfluenza Virus 2 NOT DETECTED NOT DETECTED Final   Parainfluenza Virus 3 NOT DETECTED NOT DETECTED Final  Parainfluenza Virus 4 NOT DETECTED NOT DETECTED Final   Respiratory Syncytial Virus NOT DETECTED NOT DETECTED Final   Bordetella pertussis NOT DETECTED NOT DETECTED Final   Bordetella Parapertussis NOT DETECTED NOT DETECTED Final   Chlamydophila pneumoniae NOT DETECTED NOT DETECTED Final   Mycoplasma pneumoniae NOT DETECTED NOT DETECTED Final    Comment: Performed at [facility], [address] [place], [place] [address]    Radiology Studies: DG CHEST PORT 1 VIEW Result Date: 07/05/2023 CLINICAL DATA:  Shortness of breath. EXAM: PORTABLE CHEST 1 VIEW COMPARISON:  Chest radiograph dated 07/04/2023 FINDINGS: No interval change since the prior radiograph. Background of emphysema and scarring. No pleural effusion pneumothorax. Stable cardiac silhouette. Atherosclerotic calcification of the aorta. No acute osseous pathology. IMPRESSION: 1. No active disease. 2. Emphysema and scarring. Electronically Signed   By: Elgie Collard M.D.   On: 07/05/2023 09:55   DG Chest Port 1 View Result Date: 07/04/2023 CLINICAL DATA:  [id number] with respiratory failure. EXAM: PORTABLE CHEST 1 VIEW COMPARISON:  Portable chest yesterday at 10:22 a.m. FINDINGS: 5:35 a.m. Emphysematous and scarring changes of both lungs are again noted. No active infiltrate is seen. Heart size and vasculature are normal apart from aortic atherosclerosis. Thoracic spondylosis with no new osseous findings. Compare: Unchanged. IMPRESSION: No evidence of acute chest disease. Emphysematous and scarring changes. Aortic atherosclerosis. Electronically Signed   By: Almira Bar M.D.   On: 07/04/2023 07:47   Scheduled Meds:  amLODipine  10 mg Oral Daily   arformoterol  15 mcg Nebulization BID   budesonide (PULMICORT) nebulizer solution  0.5 mg Nebulization BID   feeding supplement  237 mL Oral BID BM   finasteride  5 mg Oral Daily   furosemide  40 mg Intravenous Once   furosemide  40 mg Intravenous Once   guaiFENesin  1,200 mg Oral BID    heparin  5,000 Units Subcutaneous Q8H   insulin aspart  0-15 Units Subcutaneous TID WC   insulin aspart  0-5 Units Subcutaneous QHS   [START ON 07/06/2023] insulin aspart  8 Units Subcutaneous TID WC   insulin glargine-yfgn  15 Units Subcutaneous BID   levalbuterol  0.63 mg Nebulization Q6H   nicotine  21 mg Transdermal Daily   [START ON 07/06/2023] predniSONE  40 mg Oral Q breakfast   Followed by   Melene Muller ON 07/10/2023] predniSONE  20 mg Oral Q breakfast   Followed by   Melene Muller ON 07/15/2023] predniSONE  10 mg Oral Q breakfast   revefenacin  175 mcg Nebulization Daily   simvastatin  10 mg Oral QHS   sodium bicarbonate  650 mg Oral BID   sodium chloride flush  3 mL Intravenous Q12H   tamsulosin  0.8 mg Oral QHS   Continuous Infusions:  azithromycin 500 mg (07/05/23 1137)   cefTRIAXone (ROCEPHIN)  IV 2 g (07/05/23 1053)    LOS: 6 days   Marguerita Merles, DO [facility] Available via Epic secure chat 7am-7pm After these hours, please refer to coverage provider listed on [email or web address] 07/05/2023, 5:44 PM

## 2023-07-05 NOTE — Inpatient Diabetes Management (Signed)
 Inpatient Diabetes Program Recommendations  AACE/ADA: New Consensus Statement on Inpatient Glycemic Control (2015)  Target Ranges:  Prepandial:   less than 140 mg/dL      Peak postprandial:   less than 180 mg/dL (1-2 hours)      Critically ill patients:  140 - 180 mg/dL   Lab Results  Component Value Date   GLUCAP 306 (H) 07/05/2023   HGBA1C 9.9 (H) 06/29/2023    Review of Glycemic Control  Latest Reference Range & Units 07/04/23 05:51 07/04/23 11:52 07/04/23 16:34 07/04/23 22:01 07/05/23 05:46 07/05/23 11:11  Glucose-Capillary 70 - 99 mg/dL 629 (H) 528 (H) 413 (HH) 267 (H) 213 (H) 306 (H)   Diabetes history: DM 2 Outpatient Diabetes medications:  Novolog 70/30 2-14 units tid with meals Farxiga 10 mg daily Current orders for Inpatient glycemic control:  Novolog 6 units tid with meals Semglee 15 units bid  Novolog 0-15 units tid with meals and HS Prednisone 40 mg daily Inpatient Diabetes Program Recommendations:    Note blood sugars go up with meals/supplements.  Did not receive meal coverage this AM b/c only ate 30% however he did receive Ensure.  Likely needs meal coverage with supplements?   May need increase in meal coverage to 8 units tid with meals while on steroids.    Thanks,  Lorenza Cambridge, RN, BC-ADM Inpatient Diabetes Coordinator Pager [phone]

## 2023-07-05 NOTE — Progress Notes (Signed)
 Physical Therapy Treatment Patient Details Name: Jeffrey Randall. MRN: [id number] DOB: 10-Aug-1940 Today's Date: 07/05/2023   History of Present Illness Pt is an 83 year old man admitted on 06/28/23 with acute respiratory failure with hypoxia due to Metapneumonia and COPD exacerbation. PMH: HTN, DM, COPD, lung ca s/p resection and radiation, alcoholism in remission, CKD 3A, gout, HLD, BPH. Pt is a current smoker.    PT Comments  Pt resting in bed on arrival and agreeable to session with steady progress towards acute goals. Pt continues to be limited by decreased cardiopulmonary endurance with pt demonstrating DOE 3-4/4 with mobility with SpO2 >92% on 4L with mobility. Pt contineus to benefit from education on energy conservtaion techniques and cues for self pacing and taking breaks as needed. Physically pt requiring grossly supervision for bed mobility, transfers and gait with rollator support. Pt continues to benefit from skilled PT services to progress toward functional mobility goals.      If plan is discharge home, recommend the following: A little help with walking and/or transfers;Assist for transportation;Help with stairs or ramp for entrance   Can travel by private vehicle        Equipment Recommendations   (possibly rollator vs no AD)    Recommendations for Other Services       Precautions / Restrictions Precautions Precautions: Fall Restrictions Weight Bearing Restrictions Per Provider Order: No     Mobility  Bed Mobility Overal bed mobility: Modified Independent             General bed mobility comments: slightly increased timr    Transfers Overall transfer level: Needs assistance Equipment used: Rollator (4 wheels) Transfers: Sit to/from Stand Sit to Stand: Supervision           General transfer comment: cues for rollator safety    Ambulation/Gait Ambulation/Gait assistance: Supervision Gait Distance (Feet): 225 Feet Assistive device: Rollator (4  wheels) Gait Pattern/deviations: Step-through pattern, Decreased stride length Gait velocity: decr     General Gait Details: steady gait with rollator, SpO2 >92% on 4L   Stairs             Wheelchair Mobility     Tilt Bed    Modified Rankin (Stroke Patients Only)       Balance Overall balance assessment: Needs assistance Sitting-balance support: No upper extremity supported, Feet supported Sitting balance-Leahy Scale: Good     Standing balance support: No upper extremity supported Standing balance-Leahy Scale: Fair Standing balance comment: benefits from AD support during dynamic tasks                            Communication Communication Communication: No apparent difficulties  Cognition Arousal: Alert Behavior During Therapy: WFL for tasks assessed/performed   PT - Cognitive impairments: No apparent impairments                         Following commands: Intact      Cueing Cueing Techniques: Verbal cues  Exercises      General Comments General comments (skin integrity, edema, etc.): VSS on 7L on arrival SpO2 100%, utilized 4L for ambulation with SpO2 >92% throughout, 3/4 DOE, replaced 7L at end of session      Pertinent Vitals/Pain Pain Assessment Pain Assessment: No/denies pain    Home Living  Prior Function            PT Goals (current goals can now be found in the care plan section) Acute Rehab PT Goals Patient Stated Goal: to get better PT Goal Formulation: With patient/family Time For Goal Achievement: 07/16/23 Progress towards PT goals: Progressing toward goals    Frequency    Min 2X/week      PT Plan      Co-evaluation              AM-PAC PT "6 Clicks" Mobility   Outcome Measure  Help needed turning from your back to your side while in a flat bed without using bedrails?: None Help needed moving from lying on your back to sitting on the side of a flat bed  without using bedrails?: None Help needed moving to and from a bed to a chair (including a wheelchair)?: A Little Help needed standing up from a chair using your arms (e.g., wheelchair or bedside chair)?: A Little Help needed to walk in hospital room?: A Little Help needed climbing 3-5 steps with a railing? : A Lot 6 Click Score: 19    End of Session Equipment Utilized During Treatment: Oxygen Activity Tolerance: Patient tolerated treatment well Patient left: with call bell/phone within reach;in chair Nurse Communication: Mobility status;Other (comment) (O2 sats) PT Visit Diagnosis: Other abnormalities of gait and mobility (R26.89)     Time: 7829-5621 PT Time Calculation (min) (ACUTE ONLY): 25 min  Charges:    $Therapeutic Exercise: 23-37 mins PT General Charges $$ ACUTE PT VISIT: 1 Visit                     Alishba Naples R. PTA Acute Rehabilitation Services Office: [phone]   Catalina Antigua 07/05/2023, 12:13 PM

## 2023-07-05 NOTE — Care Management Important Message (Signed)
 Important Message  Patient Details  Name: Jeffrey Randall. MRN: [id number] Date of Birth: [birth year]   Important Message Given:  Yes - Medicare IM     [name] 07/05/2023, 11:05 AM

## 2023-07-06 DIAGNOSIS — J9601 Acute respiratory failure with hypoxia: Secondary | ICD-10-CM | POA: Diagnosis not present

## 2023-07-06 LAB — GLUCOSE, CAPILLARY
Glucose-Capillary: 250 mg/dL — ABNORMAL HIGH (ref 70–99)
Glucose-Capillary: 173 mg/dL — ABNORMAL HIGH (ref 70–99)
Glucose-Capillary: 377 mg/dL — ABNORMAL HIGH (ref 70–99)
Glucose-Capillary: 249 mg/dL — ABNORMAL HIGH (ref 70–99)

## 2023-07-06 NOTE — Progress Notes (Signed)
 Mobility Specialist Progress Note:   07/06/23 1129  Mobility  Activity Ambulated with assistance in hallway;Ambulated with assistance in room;Transferred from bed to chair  Level of Assistance Standby assist, set-up cues, supervision of patient - no hands on  Assistive Device Front wheel walker  Distance Ambulated (ft) 240 ft  Activity Response Tolerated well  Mobility Referral Yes  Mobility visit 1 Mobility  Mobility Specialist Start Time (ACUTE ONLY) 1120  Mobility Specialist Stop Time (ACUTE ONLY) 1128  Mobility Specialist Time Calculation (min) (ACUTE ONLY) 8 min   Pt received in bed, agreeable to ambulatory O2 sat test. Ambulated in hallway with RW and SBA for safety. SpO2 96% on RA at rest,  desat halfway throughout session and recovered from Spo2 85% on RA to SpO2 91% on 0.5L. Returned pt back to room sitting up in chair, SpO2 99% on 2.5L. Left with all needs met, call bell and phone in reach.    Feliciana Rossetti Mobility Specialist Please contact via Special educational needs teacher or  Rehab office at [phone]

## 2023-07-06 NOTE — Progress Notes (Signed)
 PROGRESS NOTE    Jeffrey Randall.  HYQ:[id number] DOB: Dec 22, 1940 DOA: 06/28/2023 PCP: Carin Hock, PA   Brief Narrative: Jeffrey Randall. is a 83 y.o. male with a history of hypertension, diabetes, COPD, lung cancer status post radiation therapy, alcoholism in remission, CKD stage IIIa, gout, hyperlipidemia, BPH.  Patient presented secondary to worsening dyspnea with evidence of COPD exacerbation, respiratory failure, and metapneumovirus infection.  Patient was also treated empirically for possible commune acquired pneumonia with ceftriaxone and azithromycin.  Improved with treatments.  Oxygen weaning down.   Assessment and Plan:  Acute respiratory failure with hypoxia Presumed secondary to COPD exacerbation secondary to metapneumovirus infection.  Patient was initially treated for possible commune acquired pneumonia with ceftriaxone and azithromycin. -Wean to room air as able  COPD exacerbation -Continue Brovana, Pulmicort, prednisone taper, Xopenex  Metapneumovirus infection Noted. -Supportive care  Chest pain Atypical with improvement after treatment of COPD exacerbation.  Troponin of 22 with negative delta at 24.  EKG with sinus rhythm.  Echocardiogram performed and significant LVEF greater than 75% without regional wall motion abnormality  AKI on CKD stage IIIa Unclear if patient has AKI versus stable CKD.  Last creatinine available from 2016.  Creatinine this admission has been stable.  Leukocytosis Likely related to steroids.  Trended down.  Primary hypertension -Continue amlodipine  Diabetes mellitus type 2 Uncontrolled with hyperglycemia and hemoglobin A1c of 9.9%. -Continue Semglee 15 units, NovoLog 8 units 3 times daily and SSI  History of lung cancer Noted.  Patient status post radiation.  Outpatient follow-up.  Gout Allopurinol was held on admission secondary to concern of AKI.  BPH -Continue finasteride and tamsulosin  Normocytic  anemia Hemoglobin stable.  Hyperlipidemia -Continue simvastatin     DVT prophylaxis: Heparin subcu Code Status:   Code Status: Full Code Family Communication: None at bedside Disposition Plan: Discharge home with home health therapy likely tomorrow morning if able to wean off oxygen   Consultants:    Procedures:    Antimicrobials:     Subjective: Patient reports improvement of dyspnea.  No chest pain.  Objective: BP (!) 153/62   Pulse 68   Temp 98.1 F (36.7 C) (Oral)   Resp 18   Ht 5\' 10"  (1.778 m)   Wt 64.3 kg   SpO2 99%   BMI 20.34 kg/m   Examination:  General exam: Appears calm and comfortable Respiratory system: Clear to auscultation. Respiratory effort normal. Cardiovascular system: S1 & S2 heard, RRR. No murmurs, rubs, gallops or clicks. Gastrointestinal system: Abdomen is nondistended, soft and nontender. Normal bowel sounds heard. Central nervous system: Alert and oriented. No focal neurological deficits. Psychiatry: Judgement and insight appear normal. Mood & affect appropriate.    Data Reviewed: I have personally reviewed following labs and imaging studies  CBC Lab Results  Component Value Date   WBC 11.1 (H) 07/05/2023   RBC 3.13 (L) 07/05/2023   HGB 9.8 (L) 07/05/2023   HCT 29.3 (L) 07/05/2023   MCV 93.6 07/05/2023   MCH 31.3 07/05/2023   PLT 311 07/05/2023   MCHC 33.4 07/05/2023   RDW 15.4 07/05/2023   LYMPHSABS 0.2 (L) 07/05/2023   MONOABS 0.2 07/05/2023   EOSABS 0.0 07/05/2023   BASOSABS 0.0 07/05/2023     Last metabolic panel Lab Results  Component Value Date   NA 143 07/05/2023   K 4.4 07/05/2023   CL 117 (H) 07/05/2023   CO2 19 (L) 07/05/2023   BUN 68 (H) 07/05/2023  CREATININE 2.72 (H) 07/05/2023   GLUCOSE 245 (H) 07/05/2023   GFRNONAA 22 (L) 07/05/2023   GFRAA 70 (L) 06/18/2014   CALCIUM 8.3 (L) 07/05/2023   PHOS 4.6 07/05/2023   PROT 5.2 (L) 07/05/2023   ALBUMIN 2.1 (L) 07/05/2023   BILITOT 0.6 07/05/2023    ALKPHOS 94 07/05/2023   AST 29 07/05/2023   ALT 47 (H) 07/05/2023   ANIONGAP 7 07/05/2023    GFR: Estimated Creatinine Clearance: 18.7 mL/min (A) (by C-G formula based on SCr of 2.72 mg/dL (H)).  Recent Results (from the past 240 hours)  Resp panel by RT-PCR (RSV, Flu A&B, Covid) Anterior Nasal Swab     Status: None   Collection Time: 06/28/23 12:27 PM   Specimen: Anterior Nasal Swab  Result Value Ref Range Status   SARS Coronavirus 2 by RT PCR NEGATIVE NEGATIVE Final   Influenza A by PCR NEGATIVE NEGATIVE Final   Influenza B by PCR NEGATIVE NEGATIVE Final    Comment: (NOTE) The Xpert Xpress SARS-CoV-2/FLU/RSV plus assay is intended as an aid in the diagnosis of influenza from Nasopharyngeal swab specimens and should not be used as a sole basis for treatment. Nasal washings and aspirates are unacceptable for Xpert Xpress SARS-CoV-2/FLU/RSV testing.  Fact Sheet for Patients: BloggerCourse.com  Fact Sheet for Healthcare Providers: SeriousBroker.it  This test is not yet approved or cleared by the [place] FDA and has been authorized for detection and/or diagnosis of SARS-CoV-2 by FDA under an Emergency Use Authorization (EUA). This EUA will remain in effect (meaning this test can be used) for the duration of the COVID-19 declaration under Section 564(b)(1) of the Act, 21 U.S.C. section 360bbb-3(b)(1), unless the authorization is terminated or revoked.     Resp Syncytial Virus by PCR NEGATIVE NEGATIVE Final    Comment: (NOTE) Fact Sheet for Patients: BloggerCourse.com  Fact Sheet for Healthcare Providers: SeriousBroker.it  This test is not yet approved or cleared by the [place] FDA and has been authorized for detection and/or diagnosis of SARS-CoV-2 by FDA under an Emergency Use Authorization (EUA). This EUA will remain in effect (meaning this test can be used)  for the duration of the COVID-19 declaration under Section 564(b)(1) of the Act, 21 U.S.C. section 360bbb-3(b)(1), unless the authorization is terminated or revoked.  Performed at [facility], [address], [place], [place] [address]   Respiratory (~20 pathogens) panel by PCR     Status: Abnormal   Collection Time: 06/28/23  3:52 PM   Specimen: Nasopharyngeal Swab; Respiratory  Result Value Ref Range Status   Adenovirus NOT DETECTED NOT DETECTED Final   Coronavirus 229E NOT DETECTED NOT DETECTED Final    Comment: (NOTE) The Coronavirus on the Respiratory Panel, DOES NOT test for the novel  Coronavirus (2019 nCoV)    Coronavirus HKU1 NOT DETECTED NOT DETECTED Final   Coronavirus NL63 NOT DETECTED NOT DETECTED Final   Coronavirus OC43 NOT DETECTED NOT DETECTED Final   Metapneumovirus DETECTED (A) NOT DETECTED Final   Rhinovirus / Enterovirus NOT DETECTED NOT DETECTED Final   Influenza A NOT DETECTED NOT DETECTED Final   Influenza B NOT DETECTED NOT DETECTED Final   Parainfluenza Virus 1 NOT DETECTED NOT DETECTED Final   Parainfluenza Virus 2 NOT DETECTED NOT DETECTED Final   Parainfluenza Virus 3 NOT DETECTED NOT DETECTED Final   Parainfluenza Virus 4 NOT DETECTED NOT DETECTED Final   Respiratory Syncytial Virus NOT DETECTED NOT DETECTED Final   Bordetella pertussis NOT DETECTED NOT DETECTED  Final   Bordetella Parapertussis NOT DETECTED NOT DETECTED Final   Chlamydophila pneumoniae NOT DETECTED NOT DETECTED Final   Mycoplasma pneumoniae NOT DETECTED NOT DETECTED Final    Comment: Performed at [facility], [address] [place], [place] [address]      Radiology Studies: DG CHEST PORT 1 VIEW Result Date: 07/05/2023 CLINICAL DATA:  Shortness of breath. EXAM: PORTABLE CHEST 1 VIEW COMPARISON:  Chest radiograph dated 07/04/2023 FINDINGS: No interval change since the prior radiograph. Background of emphysema and scarring. No pleural effusion pneumothorax. Stable  cardiac silhouette. Atherosclerotic calcification of the aorta. No acute osseous pathology. IMPRESSION: 1. No active disease. 2. Emphysema and scarring. Electronically Signed   By: Elgie Collard M.D.   On: 07/05/2023 09:55      LOS: 7 days    Jacquelin Hawking, MD [facility] 07/06/2023, 10:22 AM   If 7PM-7AM, please contact night-coverage [email or web address]

## 2023-07-06 NOTE — Progress Notes (Signed)
 Mobility Specialist Progress Note:  Nurse requested Mobility Specialist to perform oxygen saturation test with pt which includes removing pt from oxygen both at rest and while ambulating.  Below are the results from that testing.     Patient Saturations on Room Air at Rest = spO2 96%  Patient Saturations on Room Air while Ambulating = sp02 85% .  Rested and performed pursed lip breathing for 1 minute with sp02 at 85%.  Patient Saturations on 0.5 Liters of oxygen while Ambulating = sp02 91%  At end of testing pt left in room on 2.5  Liters of oxygen.  Reported results to nurse.   Feliciana Rossetti Mobility Specialist Please contact via Special educational needs teacher or  Rehab office at [phone]

## 2023-07-06 NOTE — Progress Notes (Signed)
 Occupational Therapy Treatment Patient Details Name: Jeffrey Randall. MRN: [id number] DOB: 1941-03-22 Today's Date: 07/06/2023   History of present illness Pt is an 83 year old man admitted on 06/28/23 with acute respiratory failure with hypoxia due to Metapneumonia and COPD exacerbation. PMH: HTN, DM, COPD, lung ca s/p resection and radiation, alcoholism in remission, CKD 3A, gout, HLD, BPH. Pt is a current smoker.   OT comments  Pt ambulated to bathroom with CGA, no AD. Managed pericare after BM without assistance. Stood at sink to wash hands with CGA. Pt with SpO2 of 100% on 2.5L O2 before and after, unable to record O2 sat with activity due to loose sensor. Sensor replaced at end of session. Pt grateful for assist to use bathroom instead of BSC. Declined further ADLs. Reinforced pursed lip breathing and pacing. Continue to recommend HHOT.       If plan is discharge home, recommend the following:  A little help with walking and/or transfers;A little help with bathing/dressing/bathroom;Assistance with cooking/housework;Assist for transportation;Help with stairs or ramp for entrance   Equipment Recommendations  Tub/shower seat    Recommendations for Other Services      Precautions / Restrictions Precautions Precautions: Fall Precaution/Restrictions Comments: watch O2 Restrictions Weight Bearing Restrictions Per Provider Order: No       Mobility Bed Mobility Overal bed mobility: Modified Independent                  Transfers Overall transfer level: Needs assistance Equipment used: None Transfers: Sit to/from Stand Sit to Stand: Contact guard assist           General transfer comment: cues to stand momentariily before walking to bathroom     Balance Overall balance assessment: Needs assistance   Sitting balance-Leahy Scale: Good       Standing balance-Leahy Scale: Fair                             ADL either performed or assessed with clinical  judgement   ADL Overall ADL's : Needs assistance/impaired     Grooming: Wash/dry hands;Oral care;Standing;Contact guard assist                   Toilet Transfer: Contact guard assist;Regular Toilet;Grab bars;Ambulation   Toileting- Clothing Manipulation and Hygiene: Set up;Sitting/lateral lean       Functional mobility during ADLs: Contact guard assist      Extremity/Trunk Assessment              Vision       Perception     Praxis     Communication Communication Communication: No apparent difficulties   Cognition Arousal: Alert Behavior During Therapy: WFL for tasks assessed/performed Cognition: Cognition impaired     Awareness: Intellectual awareness intact, Online awareness impaired       OT - Cognition Comments: decreased awareness of deficits of imbalance and decreased activity tolerance                 Following commands: Intact        Cueing      Exercises      Shoulder Instructions       General Comments      Pertinent Vitals/ Pain       Pain Assessment Pain Assessment: No/denies pain  Home Living  Prior Functioning/Environment              Frequency  Min 2X/week        Progress Toward Goals  OT Goals(current goals can now be found in the care plan section)  Progress towards OT goals: Progressing toward goals  Acute Rehab OT Goals OT Goal Formulation: With patient Time For Goal Achievement: 07/15/23 Potential to Achieve Goals: Good ADL Goals Pt Will Perform Grooming: with supervision;standing Pt Will Perform Upper Body Bathing: with set-up;sitting Pt Will Perform Lower Body Bathing: sit to/from stand;with supervision Pt Will Perform Lower Body Dressing: sit to/from stand;with supervision Pt Will Transfer to Toilet: with supervision;ambulating Pt Will Perform Toileting - Clothing Manipulation and hygiene: with supervision;sit to/from  stand Additional ADL Goal #1: Pt will employ energy conservation and pursed lip breathing strategies during exertion.  Plan      Co-evaluation                 AM-PAC OT "6 Clicks" Daily Activity     Outcome Measure   Help from another person eating meals?: None Help from another person taking care of personal grooming?: A Little Help from another person toileting, which includes using toliet, bedpan, or urinal?: A Little Help from another person bathing (including washing, rinsing, drying)?: A Little Help from another person to put on and taking off regular upper body clothing?: A Little Help from another person to put on and taking off regular lower body clothing?: A Little 6 Click Score: 19    End of Session Equipment Utilized During Treatment: Gait belt;Oxygen  OT Visit Diagnosis: Unsteadiness on feet (R26.81);Other (comment);Muscle weakness (generalized) (M62.81) (decreased activity tolerance)   Activity Tolerance Patient tolerated treatment well   Patient Left in bed;with call bell/phone within reach   Nurse Communication          Time: [phone] OT Time Calculation (min): 33 min  Charges: OT General Charges $OT Visit: 1 Visit OT Treatments $Self Care/Home Management : 23-37 mins  Berna Spare, OTR/L Acute Rehabilitation Services Office: [phone]  Evern Bio 07/06/2023, 11:54 AM

## 2023-07-07 DIAGNOSIS — J9601 Acute respiratory failure with hypoxia: Secondary | ICD-10-CM | POA: Diagnosis not present

## 2023-07-07 LAB — GLUCOSE, CAPILLARY
Glucose-Capillary: 98 mg/dL (ref 70–99)
Glucose-Capillary: 173 mg/dL — ABNORMAL HIGH (ref 70–99)
Glucose-Capillary: 204 mg/dL — ABNORMAL HIGH (ref 70–99)

## 2023-07-07 MED ORDER — PREDNISONE 10 MG PO TABS
ORAL_TABLET | ORAL | 0 refills | Status: AC
Start: 2023-07-08 — End: 2023-07-16

## 2023-07-07 MED ORDER — NICOTINE 21 MG/24HR TD PT24: 21.0000 mg | MEDICATED_PATCH | Freq: Every day | TRANSDERMAL | 0 refills | Status: DC

## 2023-07-07 NOTE — TOC Transition Note (Signed)
 Transition of Care [facility]) - Discharge Note [name] RN, BSN Transitions of Care Unit 4E- RN Case Manager See Treatment Team for direct phone #   Patient Details  Name: Jeffrey Randall. MRN: [id number] Date of Birth: 12/26/1940  Transition of Care [facility]) CM/SW Contact:  Darrold Span, RN Phone Number: 07/07/2023, 3:41 PM   Clinical Narrative:    Pt stable for transition home today, Orders placed for HHPT/OT and DME- home 02.  CM spoke with pt at bedside- list provided for [facility] choice Per CMS guidelines from [email or web address].pl website with star ratings (copy placed in shadow chart)- per pt he does not have a preference for [facility] and defers to this writer to secure agency on his behalf.  Also discussed DME needs- pt voiced he has a nebulizer at home but does not know agency that supplied it- choice offered for home 02 needs- pt states he does not have a preference as long as he can go home today.   Address, phone # and PCP confirmed.  Daughter who was at bedside earlier- stated that either she or pt's son would provide transport home when he is ready.   Call made to [employer] liaison for home 02 referral- referral accepted and once processed - [employer] to deliver portable 02 to room for transport home- [employer] will deliver home concentrator/supplies later today.   Call made to [facility] liaison- for HHPT/OT referral- referral has been accepted and they will contact pt for start of care scheduling.   No further TOC needs noted  Final next level of care: Home w Home Health Services Barriers to Discharge: No Barriers Identified   Patient Goals and CMS Choice Patient states their goals for this hospitalization and ongoing recovery are:: return home CMS Medicare.gov Compare Post Acute Care list provided to:: Patient Choice offered to / list presented to : Patient      Discharge Placement                 Home w/ [facility]      Discharge Plan and Services Additional resources added to  the After Visit Summary for     Discharge Planning Services: CM Consult Post Acute Care Choice: Durable Medical Equipment, Home Health          DME Arranged: Oxygen DME Agency: [facility] Date DME Agency Contacted: 07/07/23 Time DME Agency Contacted: 1530 Representative spoke with at DME Agency: Mitch HH Arranged: PT, OT HH Agency: [facility] Date [facility] Agency Contacted: 07/07/23 Time HH Agency Contacted: 1540 Representative spoke with at [facility] Agency: Amy  Social Drivers of Health (SDOH) Interventions SDOH Screenings   Food Insecurity: No Food Insecurity (06/28/2023)  Housing: Low Risk  (06/28/2023)  Transportation Needs: No Transportation Needs (06/28/2023)  Utilities: Not At Risk (06/28/2023)  Social Connections: Moderately Isolated (06/28/2023)  Tobacco Use: High Risk (06/28/2023)     Readmission Risk Interventions    07/07/2023    3:41 PM  Readmission Risk Prevention Plan  Transportation Screening Complete  Home Care Screening Complete  Medication Review (RN CM) Complete

## 2023-07-07 NOTE — Plan of Care (Signed)
 Discharge instructions discussed with patient.  Patient instructed on home medications, restrictions, and follow up appointments. Belongings gathered and sent with patient.  Patients medications sent to [facility]. Oxygen to be delivered to bedside.

## 2023-07-07 NOTE — TOC CM/SW Note (Signed)
 Per mobility note documentation:   Mobility Specialist Progress Note:   Nurse requested Mobility Specialist to perform oxygen saturation test with pt which includes removing pt from oxygen both at rest and while ambulating.  Below are the results from that testing.      Patient Saturations on Room Air at Rest = spO2 96%   Patient Saturations on Room Air while Ambulating = sp02 85% .  Rested and performed pursed lip breathing for 1 minute with sp02 at 90%.   Patient Saturations on 0 Liters of oxygen while Ambulating = sp02 90%   At end of testing pt left in room on 1  Liters of oxygen.   Reported results to nurse.      Feliciana Rossetti Mobility Specialist Please contact via Special educational needs teacher or  Rehab office at [phone]

## 2023-07-07 NOTE — Progress Notes (Signed)
 Mobility Specialist Progress Note:  Nurse requested Mobility Specialist to perform oxygen saturation test with pt which includes removing pt from oxygen both at rest and while ambulating.  Below are the results from that testing.     Patient Saturations on Room Air at Rest = spO2 96%  Patient Saturations on Room Air while Ambulating = sp02 85% .  Rested and performed pursed lip breathing for 1 minute with sp02 at 90%.  Patient Saturations on 0 Liters of oxygen while Ambulating = sp02 90%  At end of testing pt left in room on 1  Liters of oxygen.  Reported results to nurse.    Feliciana Rossetti Mobility Specialist Please contact via Special educational needs teacher or  Rehab office at [phone]

## 2023-07-07 NOTE — Inpatient Diabetes Management (Signed)
 Inpatient Diabetes Program Recommendations  AACE/ADA: New Consensus Statement on Inpatient Glycemic Control (2015)  Target Ranges:  Prepandial:   less than 140 mg/dL      Peak postprandial:   less than 180 mg/dL (1-2 hours)      Critically ill patients:  140 - 180 mg/dL   Lab Results  Component Value Date   GLUCAP 98 07/07/2023   HGBA1C 9.9 (H) 06/29/2023    Latest Reference Range & Units 07/06/23 06:26 07/06/23 11:47 07/06/23 16:17 07/06/23 21:30 07/07/23 06:37  Glucose-Capillary 70 - 99 mg/dL 629 (H) 528 (H) 413 (H) 249 (H) 98  (H): Data is abnormally high  Diabetes history: DM 2 Outpatient Diabetes medications:  Novolog 70/30 2-14 units tid with meals Farxiga 10 mg daily Current orders for Inpatient glycemic control:  Novolog 8 units tid with meals Semglee 15 units bid  Novolog 0-15 units tid with meals and HS Prednisone 40 mg daily  Inpatient Diabetes Program Recommendations:   As steroids taper, will need to taper Semglee. Please consider: -Decrease Semglee to 12 units bid.  Thank you, Billy Fischer. Dietrich Ke, RN, MSN, CDCES  Diabetes Coordinator Inpatient Glycemic Control Team Team Pager [phone] (8am-5pm) 07/07/2023 10:07 AM

## 2023-07-07 NOTE — Discharge Summary (Signed)
 Physician Discharge Summary   Patient: Jeffrey Randall. MRN: [id number] DOB: [birth year]  Admit date:     06/28/2023  Discharge date: 07/07/23  Discharge Physician: Jacquelin Hawking, MD   PCP: Carin Hock, PA   Recommendations at discharge:  PCP visit in 1 week for hospital follow-up Follow-up with pulmonology  Discharge Diagnoses: Principal Problem:   Acute respiratory failure with hypoxia [facility]) Active Problems:   Essential hypertension   Type 2 diabetes mellitus without complication (HCC)   COPD (chronic obstructive pulmonary disease) (HCC)   Acute renal failure superimposed on stage 3a chronic kidney disease (HCC)   BPH (benign prostatic hyperplasia)   Hyperlipidemia   Gout   History of lung cancer  Resolved Problems:   * No resolved hospital problems. *  Hospital Course: Jeffrey Yon. is a 83 y.o. male with a history of hypertension, diabetes, COPD, lung cancer status post radiation therapy, alcoholism in remission, CKD stage IIIa, gout, hyperlipidemia, BPH.  Patient presented secondary to worsening dyspnea with evidence of COPD exacerbation, respiratory failure, and metapneumovirus infection.  Patient was also treated empirically for possible commune acquired pneumonia with ceftriaxone and azithromycin.  Improved with treatments.  Oxygen weaning down to room air at rest, however patient is requiring 1 L/min of supplemental oxygen with ambulation.  Patient discharged home with home health therapy services and oxygen.  Assessment and Plan:  Acute respiratory failure with hypoxia Presumed secondary to COPD exacerbation secondary to metapneumovirus infection.  Patient was initially treated for possible commune acquired pneumonia with ceftriaxone and azithromycin.  Patient weaned to room air at rest, but is requiring 1 L/min of supplemental oxygen during ambulation.  PT/OT recommending home health therapy services.   COPD exacerbation Continue home Trelegy,  albuterol/DuoNeb.  Patient discharged with a prednisone taper.   Metapneumovirus infection Positive result on admission.  Supportive care provided.   Chest pain Atypical with improvement after treatment of COPD exacerbation.  Troponin of 22 with negative delta at 24.  EKG with sinus rhythm.  Echocardiogram performed and significant LVEF greater than 75% without regional wall motion abnormality   AKI on CKD stage IIIa Unclear if patient has AKI versus stable CKD.  Last creatinine available from 2016.  Creatinine this admission has been stable.   Leukocytosis Likely related to steroids.  Trended down.   Primary hypertension Continue amlodipine.   Diabetes mellitus type 2 Uncontrolled with hyperglycemia and hemoglobin A1c of 9.9%.  Continue home NovoLog 70/30.   History of lung cancer Noted.  Patient status post radiation.  Outpatient follow-up.   Gout Allopurinol was held on admission secondary to concern of AKI.   BPH Continue finasteride and tamsulosin.   Normocytic anemia Hemoglobin stable.   Hyperlipidemia Continue simvastatin   Consultants: None Procedures performed: None Disposition: Home with home health therapy services Diet recommendation: Cardiac and Carb modified diet   DISCHARGE MEDICATION: Allergies as of 07/07/2023       Reactions   Chantix Continuing Month [varenicline] Other (See Comments)   Doesn't work for patient   Januvia [sitagliptin] Other (See Comments)   Class Contraindicated   Lisinopril Swelling   Possible tongue swelling. ARB's okay   Motrin [ibuprofen] Other (See Comments)   Contraindicated by Renal disease   Remeron [mirtazapine] Other (See Comments)   Excess somnolence on the 15 mg dose   Penicillins Other (See Comments)   Facial swelling        Medication List     TAKE these medications  Albuterol Sulfate 2.5 MG/0.5ML Nebu Inhale into the lungs.   albuterol 108 (90 Base) MCG/ACT inhaler Commonly known as: VENTOLIN  HFA Inhale 1 puff into the lungs daily as needed for wheezing or shortness of breath.   allopurinol 100 MG tablet Commonly known as: ZYLOPRIM Take 100 mg by mouth every morning.   amLODipine 10 MG tablet Commonly known as: NORVASC Take 10 mg by mouth daily.   aspirin EC 81 MG tablet Take 1 tablet (81 mg total) by mouth daily. Swallow whole.   Farxiga 10 MG Tabs tablet Generic drug: dapagliflozin propanediol Take 1 tablet by mouth daily.   finasteride 5 MG tablet Commonly known as: PROSCAR Take 5 mg by mouth daily.   Fish Oil 1200 MG Caps Take 1,200 mg by mouth daily.   FreeStyle Libre 3 Plus Sensor Misc Use as directed and change every 14 (fourteen) days.   ipratropium-albuterol 0.5-2.5 (3) MG/3ML Soln Commonly known as: DUONEB Take 3 mLs by nebulization every 4 (four) hours as needed. What changed:  when to take this reasons to take this   nicotine 21 mg/24hr patch Commonly known as: NICODERM CQ - dosed in mg/24 hours Place 1 patch (21 mg total) onto the skin daily. Start taking on: July 08, 2023   NovoLOG 70/30 FlexPen (70-30) 100 UNIT/ML FlexPen Generic drug: insulin aspart protamine - aspart Inject 2-14 Units into the skin in the morning, at noon, and at bedtime.   predniSONE 10 MG tablet Commonly known as: DELTASONE Take 4 tablets (40 mg total) by mouth daily with breakfast for 2 days, THEN 2 tablets (20 mg total) daily with breakfast for 3 days, THEN 1 tablet (10 mg total) daily with breakfast for 3 days. Start taking on: July 08, 2023   simvastatin 10 MG tablet Commonly known as: ZOCOR Take 10 mg by mouth at bedtime.   tamsulosin 0.4 MG Caps capsule Commonly known as: FLOMAX Take 0.8 mg by mouth at bedtime.   Trelegy Ellipta 200-62.5-25 MCG/ACT Aepb Generic drug: Fluticasone-Umeclidin-Vilant Inhale 1 puff into the lungs daily.   valsartan-hydrochlorothiazide 320-25 MG tablet Commonly known as: DIOVAN-HCT Take 1 tablet by mouth daily.   Vitamin  B-12 2500 MCG Subl Place 2,500 mcg under the tongue daily.               Durable Medical Equipment  (From admission, onward)           Start     Ordered   07/07/23 1513  For home use only DME oxygen  Once       Question Answer Comment  Length of Need 6 Months   Mode or (Route) Nasal cannula   Liters per Minute 1   Frequency Continuous (stationary and portable oxygen unit needed)   Oxygen delivery system Gas      07/07/23 1512            Follow-up Information     Carin Hock, [place]. Schedule an appointment as soon as possible for a visit in 1 week(s).   Specialty: Physician Assistant Why: For hospital follow-up Contact information: [address] [place] [address] [phone]         Chilton Greathouse, MD. Schedule an appointment as soon as possible for a visit.   Specialty: Pulmonary Disease Contact information: [address] [place] [address] [phone]                Discharge Exam: BP (!) 173/64 (BP Location: Left  Arm)   Pulse 70   Temp 98.3 F (36.8 C) (Oral)   Resp (!) 23   Ht 5\' 10"  (1.778 m)   Wt 64.3 kg   SpO2 100%   BMI 20.34 kg/m   General exam: Appears calm and comfortable Respiratory system: Clear to auscultation. Respiratory effort normal. Cardiovascular system: S1 & S2 heard, RRR. No murmurs, rubs, gallops or clicks. Gastrointestinal system: Abdomen is nondistended, soft and nontender. Normal bowel sounds heard. Central nervous system: Alert and oriented. No focal neurological deficits. Psychiatry: Judgement and insight appear normal. Mood & affect appropriate.   Condition at discharge: stable  The results of significant diagnostics from this hospitalization (including imaging, microbiology, ancillary and laboratory) are listed below for reference.   Imaging Studies: DG CHEST PORT 1 VIEW Result Date: 07/05/2023 CLINICAL DATA:  Shortness of breath. EXAM: PORTABLE CHEST 1 VIEW  COMPARISON:  Chest radiograph dated 07/04/2023 FINDINGS: No interval change since the prior radiograph. Background of emphysema and scarring. No pleural effusion pneumothorax. Stable cardiac silhouette. Atherosclerotic calcification of the aorta. No acute osseous pathology. IMPRESSION: 1. No active disease. 2. Emphysema and scarring. Electronically Signed   By: Elgie Collard M.D.   On: 07/05/2023 09:55   DG Chest Port 1 View Result Date: 07/04/2023 CLINICAL DATA:  01751 with respiratory failure. EXAM: PORTABLE CHEST 1 VIEW COMPARISON:  Portable chest yesterday at 10:22 a.m. FINDINGS: 5:35 a.m. Emphysematous and scarring changes of both lungs are again noted. No active infiltrate is seen. Heart size and vasculature are normal apart from aortic atherosclerosis. Thoracic spondylosis with no new osseous findings. Compare: Unchanged. IMPRESSION: No evidence of acute chest disease. Emphysematous and scarring changes. Aortic atherosclerosis. Electronically Signed   By: Almira Bar M.D.   On: 07/04/2023 07:47   VAS [place] LOWER EXTREMITY VENOUS (DVT) Result Date: 07/03/2023  Lower Venous DVT Study Patient Name:  Jeffrey Randall.  Date of Exam:   07/03/2023 Medical Rec #: [id number]            Accession #:    [id number] Date of Birth: 1940/08/05             Patient Gender: M Patient Age:   61 years Exam Location:  [facility] Procedure:      VAS [place] LOWER EXTREMITY VENOUS (DVT) Referring Phys: Marguerita Merles --------------------------------------------------------------------------------  Indications: Pain.  Risk Factors: Cancer Lung. Comparison Study: None. Performing Technologist: Shona Simpson  Examination Guidelines: A complete evaluation includes B-mode imaging, spectral Doppler, color Doppler, and power Doppler as needed of all accessible portions of each vessel. Bilateral testing is considered an integral part of a complete examination. Limited examinations for reoccurring indications may be performed as  noted. The reflux portion of the exam is performed with the patient in reverse Trendelenburg.  +---------+---------------+---------+-----------+----------+--------------+ RIGHT    CompressibilityPhasicitySpontaneityPropertiesThrombus Aging +---------+---------------+---------+-----------+----------+--------------+ CFV      Full           Yes      Yes                                 +---------+---------------+---------+-----------+----------+--------------+ SFJ      Full                                                        +---------+---------------+---------+-----------+----------+--------------+  FV Prox  Full                                                        +---------+---------------+---------+-----------+----------+--------------+ FV Mid   Full                                                        +---------+---------------+---------+-----------+----------+--------------+ FV DistalFull                                                        +---------+---------------+---------+-----------+----------+--------------+ PFV      Full                                                        +---------+---------------+---------+-----------+----------+--------------+ POP      Full           Yes      Yes                                 +---------+---------------+---------+-----------+----------+--------------+ PTV      Full                                                        +---------+---------------+---------+-----------+----------+--------------+ PERO     Full                                                        +---------+---------------+---------+-----------+----------+--------------+   +---------+---------------+---------+-----------+----------+--------------+ LEFT     CompressibilityPhasicitySpontaneityPropertiesThrombus Aging +---------+---------------+---------+-----------+----------+--------------+ CFV      Full            Yes      Yes                                 +---------+---------------+---------+-----------+----------+--------------+ SFJ      Full                                                        +---------+---------------+---------+-----------+----------+--------------+ FV Prox  Full                                                        +---------+---------------+---------+-----------+----------+--------------+  FV Mid   Full                                                        +---------+---------------+---------+-----------+----------+--------------+ FV DistalFull                                                        +---------+---------------+---------+-----------+----------+--------------+ PFV      Full                                                        +---------+---------------+---------+-----------+----------+--------------+ POP      Full           Yes      Yes                                 +---------+---------------+---------+-----------+----------+--------------+ PTV      Full                                                        +---------+---------------+---------+-----------+----------+--------------+ PERO     Full                                                        +---------+---------------+---------+-----------+----------+--------------+     Summary: BILATERAL: - No evidence of deep vein thrombosis seen in the lower extremities, bilaterally. -No evidence of popliteal cyst, bilaterally.   *See table(s) above for measurements and observations. Electronically signed by Lemar Livings MD on 07/03/2023 at 5:40:38 PM.    Final    DG CHEST PORT 1 VIEW Result Date: 07/03/2023 CLINICAL DATA:  Shortness of breath. EXAM: PORTABLE CHEST 1 VIEW COMPARISON:  07/02/2023 and CT chest 09/09/2020. FINDINGS: Patient is rotated. Trachea is midline. Heart size normal. Thoracic aorta is calcified. Scarring in the apical right upper lobe. Additional  scarring in the left midlung zone. No airspace consolidation or pleural fluid. Chronic blunting of the left costophrenic angle. Left apical pleural thickening. IMPRESSION: No acute findings. Electronically Signed   By: Leanna Battles M.D.   On: 07/03/2023 14:01   ECHOCARDIOGRAM COMPLETE Result Date: 07/02/2023    ECHOCARDIOGRAM REPORT   Patient Name:   Jeffrey Randall. Date of Exam: 07/02/2023 Medical Rec #:  [id number]           Height:       70.0 in Accession #:    [id number]          Weight:       141.8 lb Date of Birth:  [birth year]            BSA:  1.803 m Patient Age:    82 years            BP:           141/55 mmHg Patient Gender: M                   HR:           79 bpm. Exam Location:  Inpatient Procedure: 2D Echo, Cardiac Doppler and Color Doppler (Both Spectral and Color            Flow Doppler were utilized during procedure). Indications:    Dyspnea R06.00  History:        Patient has no prior history of Echocardiogram examinations.                 COPD; Risk Factors:Hypertension, Diabetes, Dyslipidemia and                 Current Smoker.  Sonographer:    Lucendia Herrlich RCS Referring Phys: [id number] Kateri Mc LATIF [facility] IMPRESSIONS  1. Left ventricular ejection fraction, by estimation, is >75%. The left ventricle has hyperdynamic function. The left ventricle has no regional wall motion abnormalities. There is moderate left ventricular hypertrophy. Left ventricular diastolic parameters were normal.  2. Right ventricular systolic function is normal. The right ventricular size is normal. Mildly increased right ventricular wall thickness. There is mildly elevated pulmonary artery systolic pressure. The estimated right ventricular systolic pressure is 38.7 mmHg.  3. A small pericardial effusion is present. The pericardial effusion is anterior to the right ventricle.  4. The mitral valve is degenerative. Mild mitral valve regurgitation. No evidence of mitral stenosis.  5. The aortic valve is  tricuspid. Aortic valve regurgitation is not visualized. Aortic valve sclerosis is present, with no evidence of aortic valve stenosis.  6. The inferior vena cava is dilated in size with >50% respiratory variability, suggesting right atrial pressure of 8 mmHg. Comparison(s): No prior Echocardiogram. Conclusion(s)/Recommendation(s): Consider workup for infiltrative process (i.e cardiac amyloidosis) if clinically warranted. FINDINGS  Left Ventricle: Left ventricular ejection fraction, by estimation, is >75%. The left ventricle has hyperdynamic function. The left ventricle has no regional wall motion abnormalities. The left ventricular internal cavity size was normal in size. There is moderate left ventricular hypertrophy. Left ventricular diastolic parameters were normal. Right Ventricle: The right ventricular size is normal. Mildly increased right ventricular wall thickness. Right ventricular systolic function is normal. There is mildly elevated pulmonary artery systolic pressure. The tricuspid regurgitant velocity is 2.77 m/s, and with an assumed right atrial pressure of 8 mmHg, the estimated right ventricular systolic pressure is 38.7 mmHg. Left Atrium: Left atrial size was normal in size. Right Atrium: Right atrial size was normal in size. Pericardium: A small pericardial effusion is present. The pericardial effusion is anterior to the right ventricle. Mitral Valve: The mitral valve is degenerative in appearance. Mild mitral annular calcification. Mild mitral valve regurgitation. No evidence of mitral valve stenosis. Tricuspid Valve: The tricuspid valve is normal in structure. Tricuspid valve regurgitation is not demonstrated. No evidence of tricuspid stenosis. Aortic Valve: The aortic valve is tricuspid. Aortic valve regurgitation is not visualized. Aortic valve sclerosis is present, with no evidence of aortic valve stenosis. Aortic valve peak gradient measures 10.1 mmHg. Pulmonic Valve: The pulmonic valve was  grossly normal. Pulmonic valve regurgitation is not visualized. No evidence of pulmonic stenosis. Aorta: The aortic root and ascending aorta are structurally normal, with no evidence of dilitation. Venous: The inferior vena cava  is dilated in size with greater than 50% respiratory variability, suggesting right atrial pressure of 8 mmHg. IAS/Shunts: The atrial septum is grossly normal.  LEFT VENTRICLE PLAX 2D LVIDd:         3.70 cm   Diastology LVIDs:         2.20 cm   LV e' medial:    7.94 cm/s LV PW:         1.40 cm   LV E/e' medial:  11.1 LV IVS:        1.40 cm   LV e' lateral:   9.46 cm/s LVOT diam:     2.10 cm   LV E/e' lateral: 9.3 LV SV:         74 LV SV Index:   41 LVOT Area:     3.46 cm  RIGHT VENTRICLE             IVC RV S prime:     21.10 cm/s  IVC diam: 2.20 cm TAPSE (M-mode): 2.5 cm LEFT ATRIUM             Index        RIGHT ATRIUM           Index LA diam:        4.40 cm 2.44 cm/m   RA Area:     16.60 cm LA Vol (A2C):   56.8 ml 31.50 ml/m  RA Volume:   38.30 ml  21.24 ml/m LA Vol (A4C):   39.7 ml 22.02 ml/m LA Biplane Vol: 49.6 ml 27.51 ml/m  AORTIC VALVE AV Area (Vmax): 2.51 cm AV Vmax:        159.00 cm/s AV Peak Grad:   10.1 mmHg LVOT Vmax:      115.00 cm/s LVOT Vmean:     74.500 cm/s LVOT VTI:       0.214 m  AORTA Ao Root diam: 3.40 cm Ao Asc diam:  3.20 cm MITRAL VALVE                TRICUSPID VALVE MV Area (PHT): 3.17 cm     TR Peak grad:   30.7 mmHg MV Decel Time: 239 msec     TR Vmax:        277.00 cm/s MV E velocity: 87.90 cm/s MV A velocity: 105.00 cm/s  SHUNTS MV E/A ratio:  0.84         Systemic VTI:  0.21 m                             Systemic Diam: 2.10 cm Sunit Tolia Electronically signed by Tessa Lerner Signature Date/Time: 07/02/2023/4:05:33 PM    Final    DG CHEST PORT 1 VIEW Result Date: 07/02/2023 CLINICAL DATA:  Shortness of breath. EXAM: PORTABLE CHEST 1 VIEW COMPARISON:  One-view chest x-ray 08/31/2023. FINDINGS: Heart is enlarged. Increasing interstitial edema is present.  Asymmetric left-sided airspace disease is present. The visualized soft tissues and bony thorax are unremarkable. IMPRESSION: 1. Cardiomegaly and increasing interstitial edema compatible with congestive heart failure. 2. Asymmetric left-sided airspace disease may represent asymmetric edema or infection. Electronically Signed   By: Marin Roberts M.D.   On: 07/02/2023 11:18   DG CHEST PORT 1 VIEW Result Date: 07/01/2023 CLINICAL DATA:  83 year old male with shortness of breath and cough. EXAM: PORTABLE CHEST 1 VIEW COMPARISON:  Portable chest 06/30/2023 and earlier. FINDINGS: Portable AP semi upright view at 0549 hours. Stable  lung volumes and mediastinal contours. Emphysema and lung scarring demonstrated by CT in 2022. Asymmetric lung size not significantly changed since that time. Stable chronic blunting of the costophrenic angles. Coarse bilateral pulmonary interstitial markings. No pneumothorax or acute pulmonary opacity. No acute osseous abnormality identified. Negative visible bowel gas. IMPRESSION: Chronic lung disease with Emphysema (ICD10-J43.9) and scarring. No acute cardiopulmonary abnormality. Electronically Signed   By: Odessa Fleming M.D.   On: 07/01/2023 08:44   DG CHEST PORT 1 VIEW Result Date: 06/30/2023 CLINICAL DATA:  Shortness of breath. EXAM: PORTABLE CHEST 1 VIEW COMPARISON:  06/29/2023 FINDINGS: The heart size and mediastinal contours are within normal limits. Pulmonary hyperinflation again seen, consistent with COPD. Stable pleural-parenchymal scarring in the left mid and lower lung fields. No evidence of acute superimposed pulmonary infiltrate, or pleural effusion. IMPRESSION: COPD and left pleural-parenchymal scarring.  No acute findings. Electronically Signed   By: Danae Orleans M.D.   On: 06/30/2023 10:56   DG CHEST PORT 1 VIEW Result Date: 06/29/2023 CLINICAL DATA:  Shortness of breath. EXAM: PORTABLE CHEST 1 VIEW COMPARISON:  Chest radiograph dated 06/28/2023. FINDINGS: Background  of emphysema. Small left pleural effusion with blunting of the left costophrenic angle. Area of nodular scarring in the right upper lobe as seen on the prior radiograph and CT. No new consolidation. No pneumothorax. Stable cardiac silhouette. Atherosclerotic calcification of the aorta. No acute osseous pathology. IMPRESSION: Small left pleural effusion. No new consolidation. Emphysema and stable right upper lobe architectural distortion. Electronically Signed   By: Elgie Collard M.D.   On: 06/29/2023 12:15   DG Chest Port 1 View Result Date: 06/28/2023 CLINICAL DATA:  Shortness of breath.  Productive cough. EXAM: PORTABLE CHEST 1 VIEW COMPARISON:  June 08, 2021. FINDINGS: The heart size and mediastinal contours are within normal limits. Left midlung and basilar opacity is noted concerning for possible atelectasis or scarring which was present on prior exam. However, probable irregular solid density is seen in right lung apex. The visualized skeletal structures are unremarkable. IMPRESSION: Probable irregular solid density seen in right lung apex. CT scan of the chest is recommended to evaluate for possible neoplasm or malignancy. Electronically Signed   By: Lupita Raider M.D.   On: 06/28/2023 14:54    Microbiology: Results for orders placed or performed during the hospital encounter of 06/28/23  Resp panel by RT-PCR (RSV, Flu A&B, Covid) Anterior Nasal Swab     Status: None   Collection Time: 06/28/23 12:27 PM   Specimen: Anterior Nasal Swab  Result Value Ref Range Status   SARS Coronavirus 2 by RT PCR NEGATIVE NEGATIVE Final   Influenza A by PCR NEGATIVE NEGATIVE Final   Influenza B by PCR NEGATIVE NEGATIVE Final    Comment: (NOTE) The Xpert Xpress SARS-CoV-2/FLU/RSV plus assay is intended as an aid in the diagnosis of influenza from Nasopharyngeal swab specimens and should not be used as a sole basis for treatment. Nasal washings and aspirates are unacceptable for Xpert Xpress  SARS-CoV-2/FLU/RSV testing.  Fact Sheet for Patients: BloggerCourse.com  Fact Sheet for Healthcare Providers: SeriousBroker.it  This test is not yet approved or cleared by the [place] FDA and has been authorized for detection and/or diagnosis of SARS-CoV-2 by FDA under an Emergency Use Authorization (EUA). This EUA will remain in effect (meaning this test can be used) for the duration of the COVID-19 declaration under Section 564(b)(1) of the Act, 21 U.S.C. section 360bbb-3(b)(1), unless the authorization is terminated or revoked.  Resp Syncytial Virus by PCR NEGATIVE NEGATIVE Final    Comment: (NOTE) Fact Sheet for Patients: BloggerCourse.com  Fact Sheet for Healthcare Providers: SeriousBroker.it  This test is not yet approved or cleared by the [place] FDA and has been authorized for detection and/or diagnosis of SARS-CoV-2 by FDA under an Emergency Use Authorization (EUA). This EUA will remain in effect (meaning this test can be used) for the duration of the COVID-19 declaration under Section 564(b)(1) of the Act, 21 U.S.C. section 360bbb-3(b)(1), unless the authorization is terminated or revoked.  Performed at [facility], [address], [place], [place] [address]   Respiratory (~20 pathogens) panel by PCR     Status: Abnormal   Collection Time: 06/28/23  3:52 PM   Specimen: Nasopharyngeal Swab; Respiratory  Result Value Ref Range Status   Adenovirus NOT DETECTED NOT DETECTED Final   Coronavirus 229E NOT DETECTED NOT DETECTED Final    Comment: (NOTE) The Coronavirus on the Respiratory Panel, DOES NOT test for the novel  Coronavirus (2019 nCoV)    Coronavirus HKU1 NOT DETECTED NOT DETECTED Final   Coronavirus NL63 NOT DETECTED NOT DETECTED Final   Coronavirus OC43 NOT DETECTED NOT DETECTED Final   Metapneumovirus DETECTED (A) NOT DETECTED Final    Rhinovirus / Enterovirus NOT DETECTED NOT DETECTED Final   Influenza A NOT DETECTED NOT DETECTED Final   Influenza B NOT DETECTED NOT DETECTED Final   Parainfluenza Virus 1 NOT DETECTED NOT DETECTED Final   Parainfluenza Virus 2 NOT DETECTED NOT DETECTED Final   Parainfluenza Virus 3 NOT DETECTED NOT DETECTED Final   Parainfluenza Virus 4 NOT DETECTED NOT DETECTED Final   Respiratory Syncytial Virus NOT DETECTED NOT DETECTED Final   Bordetella pertussis NOT DETECTED NOT DETECTED Final   Bordetella Parapertussis NOT DETECTED NOT DETECTED Final   Chlamydophila pneumoniae NOT DETECTED NOT DETECTED Final   Mycoplasma pneumoniae NOT DETECTED NOT DETECTED Final    Comment: Performed at [facility], [address], [place], [place] [address]    Labs: CBC: Recent Labs  Lab 07/02/23 0259 07/02/23 1853 07/03/23 1018 07/04/23 0352 07/05/23 0335  WBC 13.2* 13.1* 11.6* 11.8* 11.1*  NEUTROABS 12.6* 12.5* 11.1* 11.4* 10.5*  HGB 10.0* 10.1* 10.3* 9.8* 9.8*  HCT 29.8* 31.1* 30.9* 29.3* 29.3*  MCV 94.6 96.9 95.1 94.8 93.6  PLT 259 289 283 301 311   Basic Metabolic Panel: Recent Labs  Lab 07/01/23 0332 07/02/23 0259 07/03/23 1018 07/04/23 0352 07/05/23 0335  NA 138 138 140 141 143  K 4.5 4.5 4.9 4.3 4.4  CL 111 115* 112* 115* 117*  CO2 18* 17* 17* 18* 19*  GLUCOSE 196* 135* 268* 256* 245*  BUN 67* 74* 73* 74* 68*  CREATININE 2.75* 2.77* 2.73* 2.74* 2.72*  CALCIUM 8.7* 8.1* 8.3* 8.3* 8.3*  MG 2.2 2.1 2.1 2.1 2.1  PHOS 4.5 4.5 5.7* 5.2* 4.6   Liver Function Tests: Recent Labs  Lab 07/01/23 0332 07/02/23 0259 07/03/23 1018 07/04/23 0352 07/05/23 0335  AST 23 26 26 20 29   ALT 25 30 32 35 47*  ALKPHOS 102 101 94 95 94  BILITOT 0.3 0.5 0.5 0.5 0.6  PROT 5.8* 5.5* 5.8* 5.6* 5.2*  ALBUMIN 2.1* 2.1* 2.2* 2.2* 2.1*   CBG: Recent Labs  Lab 07/06/23 1147 07/06/23 1617 07/06/23 2130 07/07/23 0637 07/07/23 1245  GLUCAP 173* 377* 249* 98 173*    Discharge time  spent: 35 minutes.  Signed: Jacquelin Hawking, MD [facility] 07/07/2023

## 2023-07-07 NOTE — Discharge Instructions (Signed)
 Jeffrey Randall.,  You are in the hospital because of a COPD exacerbation which was started because of a viral infection.  You were managed with breathing treatments and steroids with improvement.  Unfortunately you are still requiring oxygen prior to discharge but hopefully this will improve with time as your lungs continue to heal from your infection.  Please do not use oxygen around open flame.  Please follow-up with your primary care physician in addition to your lung doctor.

## 2023-07-07 NOTE — Progress Notes (Signed)
 Mobility Specialist Progress Note:   07/07/23 1054  Mobility  Activity Ambulated with assistance in room;Ambulated with assistance in hallway  Level of Assistance Standby assist, set-up cues, supervision of patient - no hands on  Assistive Device Front wheel walker  Distance Ambulated (ft) 240 ft  Activity Response Tolerated well  Mobility Referral Yes  Mobility visit 1 Mobility  Mobility Specialist Start Time (ACUTE ONLY) 1030  Mobility Specialist Stop Time (ACUTE ONLY) 1040  Mobility Specialist Time Calculation (min) (ACUTE ONLY) 10 min   Pt received in bed, agreeable to mobility session. Ambulated in hallway on RA. Took two standing rest breaks when SpO2 85% on RA. Recovered both times, SpO2 90% on RA. Audible SOB upon arrival to room, d/t being on phone call post mobility session. Returned pt to room, SpO2 97% on 1L. Left with all needs met, RN notified.   Feliciana Rossetti Mobility Specialist Please contact via Special educational needs teacher or  Rehab office at [phone]

## 2023-07-12 DIAGNOSIS — J918 Pleural effusion in other conditions classified elsewhere: Secondary | ICD-10-CM | POA: Diagnosis not present

## 2023-07-12 DIAGNOSIS — J8410 Pulmonary fibrosis, unspecified: Secondary | ICD-10-CM | POA: Diagnosis not present

## 2023-07-12 DIAGNOSIS — D491 Neoplasm of unspecified behavior of respiratory system: Secondary | ICD-10-CM | POA: Diagnosis not present

## 2023-07-12 DIAGNOSIS — R188 Other ascites: Secondary | ICD-10-CM | POA: Diagnosis not present

## 2023-07-12 DIAGNOSIS — Z87891 Personal history of nicotine dependence: Secondary | ICD-10-CM | POA: Diagnosis not present

## 2023-07-12 DIAGNOSIS — R911 Solitary pulmonary nodule: Secondary | ICD-10-CM | POA: Diagnosis not present

## 2023-07-12 DIAGNOSIS — C3411 Malignant neoplasm of upper lobe, right bronchus or lung: Secondary | ICD-10-CM | POA: Diagnosis not present

## 2023-07-14 DIAGNOSIS — I129 Hypertensive chronic kidney disease with stage 1 through stage 4 chronic kidney disease, or unspecified chronic kidney disease: Secondary | ICD-10-CM | POA: Diagnosis not present

## 2023-07-14 DIAGNOSIS — E1122 Type 2 diabetes mellitus with diabetic chronic kidney disease: Secondary | ICD-10-CM | POA: Diagnosis not present

## 2023-07-14 DIAGNOSIS — D72829 Elevated white blood cell count, unspecified: Secondary | ICD-10-CM | POA: Diagnosis not present

## 2023-07-14 DIAGNOSIS — D631 Anemia in chronic kidney disease: Secondary | ICD-10-CM | POA: Diagnosis not present

## 2023-07-14 DIAGNOSIS — N179 Acute kidney failure, unspecified: Secondary | ICD-10-CM | POA: Diagnosis not present

## 2023-07-14 DIAGNOSIS — E1165 Type 2 diabetes mellitus with hyperglycemia: Secondary | ICD-10-CM | POA: Diagnosis not present

## 2023-07-14 DIAGNOSIS — J441 Chronic obstructive pulmonary disease with (acute) exacerbation: Secondary | ICD-10-CM | POA: Diagnosis not present

## 2023-07-14 DIAGNOSIS — J9601 Acute respiratory failure with hypoxia: Secondary | ICD-10-CM | POA: Diagnosis not present

## 2023-07-14 DIAGNOSIS — N1831 Chronic kidney disease, stage 3a: Secondary | ICD-10-CM | POA: Diagnosis not present

## 2023-07-15 DIAGNOSIS — Z794 Long term (current) use of insulin: Secondary | ICD-10-CM | POA: Diagnosis not present

## 2023-07-15 DIAGNOSIS — N179 Acute kidney failure, unspecified: Secondary | ICD-10-CM | POA: Diagnosis not present

## 2023-07-15 DIAGNOSIS — I10 Essential (primary) hypertension: Secondary | ICD-10-CM | POA: Diagnosis not present

## 2023-07-15 DIAGNOSIS — E11649 Type 2 diabetes mellitus with hypoglycemia without coma: Secondary | ICD-10-CM | POA: Diagnosis not present

## 2023-07-15 DIAGNOSIS — J441 Chronic obstructive pulmonary disease with (acute) exacerbation: Secondary | ICD-10-CM | POA: Diagnosis not present

## 2023-07-15 DIAGNOSIS — N184 Chronic kidney disease, stage 4 (severe): Secondary | ICD-10-CM | POA: Diagnosis not present

## 2023-07-15 DIAGNOSIS — J449 Chronic obstructive pulmonary disease, unspecified: Secondary | ICD-10-CM | POA: Diagnosis not present

## 2023-07-15 DIAGNOSIS — E1165 Type 2 diabetes mellitus with hyperglycemia: Secondary | ICD-10-CM | POA: Diagnosis not present

## 2023-07-19 ENCOUNTER — Ambulatory Visit: Payer: Self-pay | Admitting: Pulmonary Disease

## 2023-07-19 DIAGNOSIS — I129 Hypertensive chronic kidney disease with stage 1 through stage 4 chronic kidney disease, or unspecified chronic kidney disease: Secondary | ICD-10-CM | POA: Diagnosis not present

## 2023-07-19 DIAGNOSIS — D631 Anemia in chronic kidney disease: Secondary | ICD-10-CM | POA: Diagnosis not present

## 2023-07-19 DIAGNOSIS — N1831 Chronic kidney disease, stage 3a: Secondary | ICD-10-CM | POA: Diagnosis not present

## 2023-07-19 DIAGNOSIS — J9601 Acute respiratory failure with hypoxia: Secondary | ICD-10-CM | POA: Diagnosis not present

## 2023-07-19 DIAGNOSIS — D72829 Elevated white blood cell count, unspecified: Secondary | ICD-10-CM | POA: Diagnosis not present

## 2023-07-19 DIAGNOSIS — E1165 Type 2 diabetes mellitus with hyperglycemia: Secondary | ICD-10-CM | POA: Diagnosis not present

## 2023-07-19 DIAGNOSIS — N179 Acute kidney failure, unspecified: Secondary | ICD-10-CM | POA: Diagnosis not present

## 2023-07-19 DIAGNOSIS — J441 Chronic obstructive pulmonary disease with (acute) exacerbation: Secondary | ICD-10-CM | POA: Diagnosis not present

## 2023-07-19 DIAGNOSIS — E1122 Type 2 diabetes mellitus with diabetic chronic kidney disease: Secondary | ICD-10-CM | POA: Diagnosis not present

## 2023-07-19 NOTE — Telephone Encounter (Signed)
 E2C2 Pulmonary Triage - Initial Assessment Questions "Chief Complaint (e.g., cough, sob, wheezing, fever, chills, sweat or additional symptoms) *Go to specific symptom protocol after initial questions. Patient was admitted to hospital 06/28/2023-07/07/2023. Shortness of breath is more in certain parts of the day. Son states he is more winded today and think its has to do with physical therapy that he had today. Son states respiratory rate isn't increased. Son states patient is able to talk in full sentences. Cough seems worse after activity along with some wheezing.   Son is concerned about oxygen tank and wants to speak with provider about any more resources for oxygen tank. Son is instructed to watch patient's symptoms. Explained to watch respirations, work of breathing and how patient responds to treatments. ED precautions given. Son will need a call back about an appointment.   "How long have symptoms been present?" Son states he feels like patient appears more short of breath today.   Have you tested for COVID or Flu? Note: If not, ask patient if a home test can be taken. If so, instruct patient to call back for positive results. No  MEDICINES:   "Have you used any OTC meds to help with symptoms?" No If yes, ask "What medications?"   "Have you used your inhalers/maintenance medication?" Yes If yes, "What medications?" Trelegy Ellipta Duoneb nebulizer treatment  If inhaler, ask "How many puffs and how often?" Note: Review instructions on medication in the chart. Trelegy Ellipta 1 puff a day Duoneb Q4H PRN  OXYGEN: "Do you wear supplemental oxygen?" Yes If yes, "How many liters are you supposed to use?" 1L  "Do you monitor your oxygen levels?" Not able to measure oxygen readings If yes, "What is your reading (oxygen level) today?"  "What is your usual oxygen saturation reading?"  (Note: Pulmonary O2 sats should be 90% or greater) 90% and greater.    Copied from CRM [id number].  Topic: Clinical - Red Word Triage >> Jul 19, 2023  5:01 PM Para March B wrote: [facility] that prompted transfer to Nurse Triage: [facility] Breathing issues. Reason for Disposition  [1] MILD difficulty breathing (e.g., minimal/no SOB at rest, SOB with walking, pulse <100) AND [2] NEW-onset or WORSE than normal  Answer Assessment - Initial Assessment Questions 1. RESPIRATORY STATUS: "Describe your breathing?" (e.g., wheezing, shortness of breath, unable to speak, severe coughing)      Shortness of breath 2. ONSET: "When did this breathing problem begin?"      Son noticed it more today-thinks it may have to do with physical therapy that happened today 3. PATTERN "Does the difficult breathing come and go, or has it been constant since it started?"      constant 4. SEVERITY: "How bad is your breathing?" (e.g., mild, moderate, severe)    - MILD: No SOB at rest, mild SOB with walking, speaks normally in sentences, can lie down, no retractions, pulse < 100.    - MODERATE: SOB at rest, SOB with minimal exertion and prefers to sit, cannot lie down flat, speaks in phrases, mild retractions, audible wheezing, pulse 100-120.    - SEVERE: Very SOB at rest, speaks in single words, struggling to breathe, sitting hunched forward, retractions, pulse > 120      Mild 5. RECURRENT SYMPTOM: "Have you had difficulty breathing before?" If Yes, ask: "When was the last time?" and "What happened that time?"      yes 6. CARDIAC HISTORY: "Do you have any history of heart disease?" (e.g., heart attack,  angina, bypass surgery, angioplasty)      no 7. LUNG HISTORY: "Do you have any history of lung disease?"  (e.g., pulmonary embolus, asthma, emphysema)     Lung CA. COPD 8. CAUSE: "What do you think is causing the breathing problem?"      unsure 9. OTHER SYMPTOMS: "Do you have any other symptoms? (e.g., dizziness, runny nose, cough, chest pain, fever)     no 10. O2 SATURATION MONITOR:  "Do you use an oxygen saturation monitor  (pulse oximeter) at home?" If Yes, ask: "What is your reading (oxygen level) today?" "What is your usual oxygen saturation reading?" (e.g., 95%)       No pulse ox-son is ordering one today  Protocols used: Breathing Difficulty-A-AH

## 2023-07-20 MED ORDER — PREDNISONE 10 MG PO TABS: 60.0000 mg | ORAL_TABLET | Freq: Every day | ORAL | 0 refills | Status: DC

## 2023-07-20 NOTE — Telephone Encounter (Signed)
 There are no openings with any provider in 2 wks  Could we use a 15 min slot with you on 4/23? This is next available

## 2023-07-20 NOTE — Addendum Note (Signed)
 Addended byChilton Greathouse on: 07/20/2023 11:31 AM   Modules accepted: Orders

## 2023-07-20 NOTE — Telephone Encounter (Addendum)
 I called and spoke with the daughter Vane He is improved over all but today he seemed to be a little more short of breath with wheezing and non productive Off prednisone since last week. Will call in additional pred taper to [facility] in [place] On 1-2 lt O2.  Family will call back if he gets any worse.  He will need a Hospital follow up in the next 1-2 weeks and an assessment for O2 levels and protable concentrator. Please make an appointment

## 2023-07-20 NOTE — Telephone Encounter (Signed)
 I called and spoke with the pt's daughter and gave appt for 08/03/23 at 2:30. Nothing further needed.

## 2023-07-20 NOTE — Telephone Encounter (Signed)
 That is fine to use the 15 mins slot on 4/23

## 2023-07-22 DIAGNOSIS — I129 Hypertensive chronic kidney disease with stage 1 through stage 4 chronic kidney disease, or unspecified chronic kidney disease: Secondary | ICD-10-CM | POA: Diagnosis not present

## 2023-07-22 DIAGNOSIS — J441 Chronic obstructive pulmonary disease with (acute) exacerbation: Secondary | ICD-10-CM | POA: Diagnosis not present

## 2023-07-22 DIAGNOSIS — N179 Acute kidney failure, unspecified: Secondary | ICD-10-CM | POA: Diagnosis not present

## 2023-07-22 DIAGNOSIS — J9601 Acute respiratory failure with hypoxia: Secondary | ICD-10-CM | POA: Diagnosis not present

## 2023-07-22 DIAGNOSIS — N1831 Chronic kidney disease, stage 3a: Secondary | ICD-10-CM | POA: Diagnosis not present

## 2023-07-22 DIAGNOSIS — E1165 Type 2 diabetes mellitus with hyperglycemia: Secondary | ICD-10-CM | POA: Diagnosis not present

## 2023-07-22 DIAGNOSIS — D631 Anemia in chronic kidney disease: Secondary | ICD-10-CM | POA: Diagnosis not present

## 2023-07-22 DIAGNOSIS — E1122 Type 2 diabetes mellitus with diabetic chronic kidney disease: Secondary | ICD-10-CM | POA: Diagnosis not present

## 2023-07-22 DIAGNOSIS — D72829 Elevated white blood cell count, unspecified: Secondary | ICD-10-CM | POA: Diagnosis not present

## 2023-07-26 DIAGNOSIS — E1122 Type 2 diabetes mellitus with diabetic chronic kidney disease: Secondary | ICD-10-CM | POA: Diagnosis not present

## 2023-07-26 DIAGNOSIS — J9601 Acute respiratory failure with hypoxia: Secondary | ICD-10-CM | POA: Diagnosis not present

## 2023-07-26 DIAGNOSIS — N179 Acute kidney failure, unspecified: Secondary | ICD-10-CM | POA: Diagnosis not present

## 2023-07-26 DIAGNOSIS — E1165 Type 2 diabetes mellitus with hyperglycemia: Secondary | ICD-10-CM | POA: Diagnosis not present

## 2023-07-26 DIAGNOSIS — I10 Essential (primary) hypertension: Secondary | ICD-10-CM | POA: Diagnosis not present

## 2023-07-26 DIAGNOSIS — N1831 Chronic kidney disease, stage 3a: Secondary | ICD-10-CM | POA: Diagnosis not present

## 2023-07-26 DIAGNOSIS — N184 Chronic kidney disease, stage 4 (severe): Secondary | ICD-10-CM | POA: Diagnosis not present

## 2023-07-26 DIAGNOSIS — J441 Chronic obstructive pulmonary disease with (acute) exacerbation: Secondary | ICD-10-CM | POA: Diagnosis not present

## 2023-07-26 DIAGNOSIS — D631 Anemia in chronic kidney disease: Secondary | ICD-10-CM | POA: Diagnosis not present

## 2023-07-26 DIAGNOSIS — D72829 Elevated white blood cell count, unspecified: Secondary | ICD-10-CM | POA: Diagnosis not present

## 2023-07-26 DIAGNOSIS — E785 Hyperlipidemia, unspecified: Secondary | ICD-10-CM | POA: Diagnosis not present

## 2023-07-26 DIAGNOSIS — I129 Hypertensive chronic kidney disease with stage 1 through stage 4 chronic kidney disease, or unspecified chronic kidney disease: Secondary | ICD-10-CM | POA: Diagnosis not present

## 2023-07-28 DIAGNOSIS — J441 Chronic obstructive pulmonary disease with (acute) exacerbation: Secondary | ICD-10-CM | POA: Diagnosis not present

## 2023-07-28 DIAGNOSIS — E1165 Type 2 diabetes mellitus with hyperglycemia: Secondary | ICD-10-CM | POA: Diagnosis not present

## 2023-07-28 DIAGNOSIS — N1831 Chronic kidney disease, stage 3a: Secondary | ICD-10-CM | POA: Diagnosis not present

## 2023-07-28 DIAGNOSIS — J9601 Acute respiratory failure with hypoxia: Secondary | ICD-10-CM | POA: Diagnosis not present

## 2023-07-28 DIAGNOSIS — D631 Anemia in chronic kidney disease: Secondary | ICD-10-CM | POA: Diagnosis not present

## 2023-07-28 DIAGNOSIS — N179 Acute kidney failure, unspecified: Secondary | ICD-10-CM | POA: Diagnosis not present

## 2023-07-28 DIAGNOSIS — I129 Hypertensive chronic kidney disease with stage 1 through stage 4 chronic kidney disease, or unspecified chronic kidney disease: Secondary | ICD-10-CM | POA: Diagnosis not present

## 2023-07-28 DIAGNOSIS — D72829 Elevated white blood cell count, unspecified: Secondary | ICD-10-CM | POA: Diagnosis not present

## 2023-07-28 DIAGNOSIS — E1122 Type 2 diabetes mellitus with diabetic chronic kidney disease: Secondary | ICD-10-CM | POA: Diagnosis not present

## 2023-08-01 DIAGNOSIS — E1065 Type 1 diabetes mellitus with hyperglycemia: Secondary | ICD-10-CM | POA: Diagnosis not present

## 2023-08-01 DIAGNOSIS — J9601 Acute respiratory failure with hypoxia: Secondary | ICD-10-CM | POA: Diagnosis not present

## 2023-08-02 DIAGNOSIS — J9601 Acute respiratory failure with hypoxia: Secondary | ICD-10-CM | POA: Diagnosis not present

## 2023-08-02 DIAGNOSIS — D631 Anemia in chronic kidney disease: Secondary | ICD-10-CM | POA: Diagnosis not present

## 2023-08-02 DIAGNOSIS — N1831 Chronic kidney disease, stage 3a: Secondary | ICD-10-CM | POA: Diagnosis not present

## 2023-08-02 DIAGNOSIS — D72829 Elevated white blood cell count, unspecified: Secondary | ICD-10-CM | POA: Diagnosis not present

## 2023-08-02 DIAGNOSIS — J441 Chronic obstructive pulmonary disease with (acute) exacerbation: Secondary | ICD-10-CM | POA: Diagnosis not present

## 2023-08-02 DIAGNOSIS — N179 Acute kidney failure, unspecified: Secondary | ICD-10-CM | POA: Diagnosis not present

## 2023-08-02 DIAGNOSIS — E1165 Type 2 diabetes mellitus with hyperglycemia: Secondary | ICD-10-CM | POA: Diagnosis not present

## 2023-08-02 DIAGNOSIS — E1122 Type 2 diabetes mellitus with diabetic chronic kidney disease: Secondary | ICD-10-CM | POA: Diagnosis not present

## 2023-08-02 DIAGNOSIS — I129 Hypertensive chronic kidney disease with stage 1 through stage 4 chronic kidney disease, or unspecified chronic kidney disease: Secondary | ICD-10-CM | POA: Diagnosis not present

## 2023-08-03 ENCOUNTER — Ambulatory Visit: Admitting: Pulmonary Disease

## 2023-08-03 ENCOUNTER — Encounter: Payer: Self-pay | Admitting: Pulmonary Disease

## 2023-08-03 VITALS — BP 136/60 | HR 67 | Temp 98.3°F | Ht 71.0 in | Wt 153.0 lb

## 2023-08-03 DIAGNOSIS — F17210 Nicotine dependence, cigarettes, uncomplicated: Secondary | ICD-10-CM | POA: Diagnosis not present

## 2023-08-03 DIAGNOSIS — J449 Chronic obstructive pulmonary disease, unspecified: Secondary | ICD-10-CM

## 2023-08-03 DIAGNOSIS — Z85118 Personal history of other malignant neoplasm of bronchus and lung: Secondary | ICD-10-CM

## 2023-08-03 DIAGNOSIS — R918 Other nonspecific abnormal finding of lung field: Secondary | ICD-10-CM | POA: Diagnosis not present

## 2023-08-03 DIAGNOSIS — Z9981 Dependence on supplemental oxygen: Secondary | ICD-10-CM

## 2023-08-03 NOTE — Progress Notes (Deleted)
 Jeffrey Randall    [id number]    [birth year]  Primary Care Physician:Dickey Fought, PA  Referring Physician: Dickey Fought, PA [address] [place],  [place] [address]  Chief complaint: Consult for COPD  HPI: 83 y.o. who  has a past medical history of Alcoholism in recovery [facility]), Aortic atherosclerosis (HCC), Back pain (06/17/2014), Chronic lung disease, CKD (chronic kidney disease), COPD (chronic obstructive pulmonary disease) (HCC) (07/23/2014), Essential hypertension, Gout, History of lung cancer, History of pancreatitis, Iron deficiency anemia, Lumbar disc disease, Mixed hyperlipidemia, Notalgia, Prostate hypertrophy, Pulmonary nodules (07/23/2014), Swelling, Tobacco use disorder (07/23/2014), Tumor of lung (01/26/2021), Type 2 diabetes mellitus without complication (HCC), and Uncontrolled type 2 diabetes mellitus with hyperglycemia, without long-term current use of insulin  (HCC).   Discussed the use of AI scribe software for clinical note transcription with the patient, who gave verbal consent to proceed.  The patient, with a history of lung nodules, emphysema, and presumed stage 1A lung cancer treated with radiation, presents with shortness of breath that started about a month ago. The shortness of breath occurs with activity such as walking. The patient also has a history of hypertension and diabetes. Despite these conditions, the patient continues to smoke, currently at a pack and a half per day. The patient is on Trelegy and Butisol for COPD but there are concerns about the effectiveness of these medications due to the patient's difficulty with inhalation. The patient also has a nebulizer but it is unclear how much of the medication the patient is actually receiving. The patient has been evaluated for kidney disease and has chronic kidney disease and heart disease.   Pets: Dog Occupation: Retired Curator Exposures: No exposures.  No mold, hot tub, Jacuzzi.  No  feather pillows or comforters Smoking history: 90-pack-year smoker.  Continues to smoke Travel history: No significant travel history Relevant family history: No family history of lung disease   Outpatient Encounter Medications as of 08/03/2023  Medication Sig   albuterol  (VENTOLIN  HFA) 108 (90 Base) MCG/ACT inhaler Inhale 1 puff into the lungs daily as needed for wheezing or shortness of breath.   Albuterol  Sulfate 2.5 MG/0.5ML NEBU Inhale into the lungs.   allopurinol (ZYLOPRIM) 100 MG tablet Take 100 mg by mouth every morning.   amLODipine  (NORVASC ) 10 MG tablet Take 10 mg by mouth daily.   aspirin  EC 81 MG tablet Take 1 tablet (81 mg total) by mouth daily. Swallow whole.   Continuous Glucose Sensor (FREESTYLE LIBRE 3 PLUS SENSOR) MISC Use as directed and change every 14 (fourteen) days.   Cyanocobalamin (VITAMIN B-12) 2500 MCG SUBL Place 2,500 mcg under the tongue daily.   finasteride  (PROSCAR ) 5 MG tablet Take 5 mg by mouth daily.   Fluticasone -Umeclidin-Vilant (TRELEGY ELLIPTA ) 200-62.5-25 MCG/ACT AEPB Inhale 1 puff into the lungs daily.   HUMALOG MIX 75/25 KWIKPEN (75-25) 100 UNIT/ML KwikPen Inject into the skin. Tid   ipratropium-albuterol  (DUONEB) 0.5-2.5 (3) MG/3ML SOLN Take 3 mLs by nebulization every 4 (four) hours as needed. (Patient taking differently: Take 3 mLs by nebulization daily as needed (wheezing/sob).)   nicotine  (NICODERM CQ  - DOSED IN MG/24 HOURS) 21 mg/24hr patch Place 1 patch (21 mg total) onto the skin daily.   Omega-3 Fatty Acids (FISH OIL) 1200 MG CAPS Take 1,200 mg by mouth daily.   simvastatin  (ZOCOR ) 10 MG tablet Take 10 mg by mouth at bedtime.   tamsulosin  (FLOMAX ) 0.4 MG CAPS capsule Take 0.8 mg by mouth  at bedtime.   valsartan-hydrochlorothiazide  (DIOVAN-HCT) 320-25 MG tablet Take 1 tablet by mouth daily.   dapagliflozin propanediol (FARXIGA) 10 MG TABS tablet Take 1 tablet by mouth daily. (Patient not taking: Reported on 08/03/2023)   [DISCONTINUED] insulin   aspart protamine - aspart (NOVOLOG  70/30 FLEXPEN) (70-30) 100 UNIT/ML FlexPen Inject 2-14 Units into the skin in the morning, at noon, and at bedtime.   [DISCONTINUED] predniSONE  (DELTASONE ) 10 MG tablet Take 6 tablets (60 mg total) by mouth daily with breakfast. Take 60 mg/day on day 1 and reduce dose by 10 mg every 2 days until taper is complete (Patient not taking: Reported on 08/03/2023)   No facility-administered encounter medications on file as of 08/03/2023.   Physical Exam: Blood pressure (!) 168/61, pulse 74, height 5\' 10"  (1.778 m), weight 141 lb 12.8 oz (64.3 kg), SpO2 93%. Gen:      No acute distress HEENT:  EOMI, sclera anicteric Neck:     No masses; no thyromegaly Lungs:    Clear to auscultation bilaterally; normal respiratory effort CV:         Regular rate and rhythm; no murmurs Abd:      + bowel sounds; soft, non-tender; no palpable masses, no distension Ext:    No edema; adequate peripheral perfusion Skin:      Warm and dry; no rash Neuro: alert and oriented x 3 Psych: normal mood and affect  Data Reviewed: Imaging: CT chest 01/21/2015 Interval resolution of bilateral pulmonary nodules, moderate upper lobe centrilobular emphysema, 3 textural distortion in the right upper lobe.  I have reviewed the images personally  CT chest [[facility]] 12/09/2022 Interval development of consolidation at the extreme right lung apex, which was not clearly within the radiation portal and has developed later than expected following radiation therapy. Suggest short-term follow-up versus PET/CT for further evaluation.   PET scan [[facility]] 12/22/2022 1.  Redemonstrated consolidation superimposed on right upper lobe radiation fibrosis with mild diffusely increased FDG uptake, favored secondary to infection/inflammation.  No evidence of recurrence or distant metastasis.  2.  Similar prominent mediastinal lymph nodes without substantial uptake, likely reactive.   CT chest [[facility]] 07/12/2023 1.  Stable  consolidation and fibrosis involving the right lung apex, favored to reflect changes related to radiation treatment. Recommend continued attention on follow-up imaging per oncologic protocol.  2.  Left lower lobe predominant bronchial wall thickening with fibrosis and volume loss, suggestive of chronic aspiration.  3.  Small volume ascites.   PFTs:  Labs:  Assessment and Plan Chronic Obstructive Pulmonary Disease (COPD) Increased shortness of breath over the past month. History of lung cancer treated with radiation. Current smoker with difficulty using inhalers correctly.  -Increase Trelegy to 200mcg daily. -Order lung function test to assess severity of COPD. -Consider Pulmonary Rehabilitation program. -Order nebulizer with DuoNeb for as needed use.  Tobacco Use Disorder Current smoker with a pack and a half per day habit. Expressed interest in quitting. -Prescribe Chantix  and nicotine  patches at highest dose. -Encourage setting a quit date.  Lung Cancer History of stage 1A lung cancer treated with radiation in 2022. Regular follow-ups with oncology at [facility] every three months. -Continue regular follow-ups with oncology.  Chronic Kidney Disease (CKD) and Heart Disease Noted in conversation but no specific issues discussed. -No changes to current management.  Follow-up in 6 months with lung function test results.   Recommendations: Increase Trelegy dose to 200 PFTs DuoNebs as needed  Fruma Africa MD [facility] 08/03/2023, 2:46 PM  CC: Dickey Fought, [place]

## 2023-08-03 NOTE — Progress Notes (Signed)
 Jeffrey Randall    [id number]    1940-06-27  Primary Care Physician:Dickey Fought, PA  Referring Physician: Dickey Fought, PA [address] [place],  [place] [address]  Chief complaint: Follow up for COPD  HPI: 83 y.o. who  has a past medical history of Alcoholism in recovery [facility]), Aortic atherosclerosis (HCC), Back pain (06/17/2014), Chronic lung disease, CKD (chronic kidney disease), COPD (chronic obstructive pulmonary disease) (HCC) (07/23/2014), Essential hypertension, Gout, History of lung cancer, History of pancreatitis, Iron deficiency anemia, Lumbar disc disease, Mixed hyperlipidemia, Notalgia, Prostate hypertrophy, Pulmonary nodules (07/23/2014), Swelling, Tobacco use disorder (07/23/2014), Tumor of lung (01/26/2021), Type 2 diabetes mellitus without complication (HCC), and Uncontrolled type 2 diabetes mellitus with hyperglycemia, without long-term current use of insulin  (HCC).   Discussed the use of AI scribe software for clinical note transcription with the patient, who gave verbal consent to proceed.  The patient, with a history of lung nodules, emphysema, and presumed stage 1A lung cancer treated with radiation, presents with shortness of breath that started about a month ago. The shortness of breath occurs with activity such as walking. The patient also has a history of hypertension and diabetes. Despite these conditions, the patient continues to smoke, currently at a pack and a half per day. The patient is on Trelegy and Butisol for COPD but there are concerns about the effectiveness of these medications due to the patient's difficulty with inhalation. The patient also has a nebulizer but it is unclear how much of the medication the patient is actually receiving. The patient has been evaluated for kidney disease and has chronic kidney disease and heart disease.   Pets: Dog Occupation: Retired Curator Exposures: No exposures.  No mold, hot tub, Jacuzzi.  No  feather pillows or comforters Smoking history: 90-pack-year smoker.  Continues to smoke Travel history: No significant travel history Relevant family history: No family history of lung disease  Interim history: Discussed the use of AI scribe software for clinical note transcription with the patient, who gave verbal consent to proceed.  History of Present Illness Jeffrey Gaus. is an 83 year old male with COPD who presents for follow-up after a recent COPD exacerbation.  He recently experienced a COPD exacerbation, which was treated with prednisone , leading to significant improvement. He completed the prednisone  course yesterday. He was hospitalized for a COPD exacerbation associated with metapneumovirus and was treated with antibiotics and steroids before being discharged.  He is currently on Trelegy 200 and continues to use it. He is also on supplemental oxygen , which was initiated following his recent hospitalization. He uses one liter of oxygen  and is awaiting a portable oxygen  concentrator for mobility. He is active and desires more mobility with his oxygen  supply.  He has a history of lung cancer, stage one, treated with radiation in 2022. He undergoes regular CT scans every four months at [facility] to monitor a spot on his lungs. The most recent scan showed a stable condition, suspected to be scar tissue rather than cancer recurrence.  He is working on smoking cessation, currently smoking one cigarette every other day. He uses Chantix  and nicotine  patches to aid in quitting smoking, although adherence to the patches is inconsistent.  He uses a nebulizer and albuterol  inhaler as needed for breathing difficulties, with the option to use them up to four times a day. The nebulizer is used at home, while the inhaler is more convenient for travel.  No current  breathing difficulties, reports improvement with prednisone .    Outpatient Encounter Medications as of 08/03/2023  Medication  Sig   albuterol  (VENTOLIN  HFA) 108 (90 Base) MCG/ACT inhaler Inhale 1 puff into the lungs daily as needed for wheezing or shortness of breath.   Albuterol  Sulfate 2.5 MG/0.5ML NEBU Inhale into the lungs.   allopurinol (ZYLOPRIM) 100 MG tablet Take 100 mg by mouth every morning.   amLODipine  (NORVASC ) 10 MG tablet Take 10 mg by mouth daily.   aspirin  EC 81 MG tablet Take 1 tablet (81 mg total) by mouth daily. Swallow whole.   Continuous Glucose Sensor (FREESTYLE LIBRE 3 PLUS SENSOR) MISC Use as directed and change every 14 (fourteen) days.   Cyanocobalamin (VITAMIN B-12) 2500 MCG SUBL Place 2,500 mcg under the tongue daily.   finasteride  (PROSCAR ) 5 MG tablet Take 5 mg by mouth daily.   Fluticasone -Umeclidin-Vilant (TRELEGY ELLIPTA ) 200-62.5-25 MCG/ACT AEPB Inhale 1 puff into the lungs daily.   HUMALOG MIX 75/25 KWIKPEN (75-25) 100 UNIT/ML KwikPen Inject into the skin. Tid   ipratropium-albuterol  (DUONEB) 0.5-2.5 (3) MG/3ML SOLN Take 3 mLs by nebulization every 4 (four) hours as needed. (Patient taking differently: Take 3 mLs by nebulization daily as needed (wheezing/sob).)   nicotine  (NICODERM CQ  - DOSED IN MG/24 HOURS) 21 mg/24hr patch Place 1 patch (21 mg total) onto the skin daily.   Omega-3 Fatty Acids (FISH OIL) 1200 MG CAPS Take 1,200 mg by mouth daily.   simvastatin  (ZOCOR ) 10 MG tablet Take 10 mg by mouth at bedtime.   tamsulosin  (FLOMAX ) 0.4 MG CAPS capsule Take 0.8 mg by mouth at bedtime.   valsartan-hydrochlorothiazide  (DIOVAN-HCT) 320-25 MG tablet Take 1 tablet by mouth daily.   dapagliflozin propanediol (FARXIGA) 10 MG TABS tablet Take 1 tablet by mouth daily. (Patient not taking: Reported on 08/03/2023)   [DISCONTINUED] insulin  aspart protamine - aspart (NOVOLOG  70/30 FLEXPEN) (70-30) 100 UNIT/ML FlexPen Inject 2-14 Units into the skin in the morning, at noon, and at bedtime.   [DISCONTINUED] predniSONE  (DELTASONE ) 10 MG tablet Take 6 tablets (60 mg total) by mouth daily with  breakfast. Take 60 mg/day on day 1 and reduce dose by 10 mg every 2 days until taper is complete (Patient not taking: Reported on 08/03/2023)   No facility-administered encounter medications on file as of 08/03/2023.   Physical Exam: Blood pressure 136/60, pulse 67, temperature 98.3 F (36.8 C), temperature source Oral, height 5\' 11"  (1.803 m), weight 153 lb (69.4 kg), SpO2 97%. Gen:      No acute distress HEENT:  EOMI, sclera anicteric Neck:     No masses; no thyromegaly Lungs:   Diminished breath sounds CV:         Regular rate and rhythm; no murmurs Abd:      + bowel sounds; soft, non-tender; no palpable masses, no distension Ext:    No edema; adequate peripheral perfusion Skin:      Warm and dry; no rash Neuro: alert and oriented x 3 Psych: normal mood and affect   Data Reviewed: Imaging: CT chest 01/21/2015 Interval resolution of bilateral pulmonary nodules, moderate upper lobe centrilobular emphysema, 3 textural distortion in the right upper lobe.  I have reviewed the images personally  CT chest [[facility]] 12/09/2022 Interval development of consolidation at the extreme right lung apex, which was not clearly within the radiation portal and has developed later than expected following radiation therapy. Suggest short-term follow-up versus PET/CT for further evaluation.   PET scan [[facility]] 12/22/2022 1.  Redemonstrated consolidation  superimposed on right upper lobe radiation fibrosis with mild diffusely increased FDG uptake, favored secondary to infection/inflammation.  No evidence of recurrence or distant metastasis.  2.  Similar prominent mediastinal lymph nodes without substantial uptake, likely reactive.   Chest x-ray 07/05/2023-no active disease, emphysema.  I reviewed these images personally  CT chest [[facility]] 07/12/2023 1.  Stable consolidation and fibrosis involving the right lung apex, favored to reflect changes related to radiation treatment. Recommend continued attention on  follow-up imaging per oncologic protocol.  2.  Left lower lobe predominant bronchial wall thickening with fibrosis and volume loss, suggestive of chronic aspiration.  3.  Small volume ascites.   PFTs:  Labs:  Assessment & Plan Chronic obstructive pulmonary disease (COPD) COPD exacerbation recently secondary to human metapneumovirus managed with prednisone , now resolved. Currently on Trelegy 200. Recent hospitalization for COPD exacerbation with metapneumovirus, treated with antibiotics and steroids. Oxygen  therapy initiated post-hospitalization. Smoking cessation efforts ongoing with reduced smoking to one cigarette every other day. Lung function test rescheduled for July to allow recovery from hospitalization. - Continue Trelegy 200 daily. - Encourage smoking cessation, continue Chantix  and nicotine  patches. - Use albuterol  inhaler or nebulizer as needed, up to four times a day. - Perform lung function test in July.  Oxygen  dependence Oxygen  therapy initiated post-hospitalization for COPD exacerbation. Currently on 1 liter of oxygen . Issues with portable oxygen  concentrator availability and mobility discussed.  - He did not desat on exertion today.  Will reassess at return visit  Nicotine  dependence Nicotine  dependence with significant reduction in smoking to one cigarette every other day. Ongoing use of Chantix  and nicotine  patches to aid cessation. - Continue Chantix  and nicotine  patches. - Encourage complete smoking cessation. - Reassess at return visit  Lung cancer Stage 1 lung cancer treated with radiation in 2022. Recent CT scan at [facility] shows findings likely scar tissue, postradiation changes and chronic aspiration rather than recurrence. Follow-up scans every four months.   Recommendations: Continue Trelegy Smoking cessation PFTs  Nayelie Gionfriddo MD [facility] 08/03/2023, 2:43 PM  CC: Dickey Fought, PA

## 2023-08-03 NOTE — Patient Instructions (Signed)
 VISIT SUMMARY:  You had a follow-up appointment today to check on your COPD after a recent exacerbation. You have been using Trelegy 200 and supplemental oxygen  since your hospitalization. You are also working on quitting smoking and have a history of lung cancer that is being monitored regularly.  YOUR PLAN:  -CHRONIC OBSTRUCTIVE PULMONARY DISEASE (COPD): COPD is a chronic lung disease that makes it hard to breathe. Your recent exacerbation has resolved after treatment with prednisone . Continue using Trelegy 200 daily, and use your albuterol  inhaler or nebulizer as needed, up to four times a day. We will recheck your oxygen  saturation today and perform a lung function test in July. Keep working on quitting smoking with Chantix  and nicotine  patches.  -OXYGEN  DEPENDENCE: You need supplemental oxygen  to help with your breathing. You are currently using 1 liter of oxygen  and are waiting for a portable oxygen  concentrator to help with mobility. We will reassess your oxygen  needs today and check on the availability of the portable concentrator.  -NICOTINE  DEPENDENCE: Nicotine  dependence means you are addicted to nicotine , which is found in cigarettes. You have reduced your smoking to one cigarette every other day. Continue using Chantix  and nicotine  patches to help you quit completely.  -LUNG CANCER: You had stage 1 lung cancer treated with radiation in 2022. Your recent CT scan shows what is likely scar tissue, not a recurrence of cancer. We will continue to monitor this with follow-up scans every four months.  INSTRUCTIONS:  Please continue using Trelegy 200 daily and your albuterol  inhaler or nebulizer as needed. We will recheck your oxygen  saturation today and perform a lung function test in July. Keep using Chantix  and nicotine  patches to help quit smoking. We will also reassess your oxygen  needs today and check on the availability of a portable oxygen  concentrator.

## 2023-08-04 DIAGNOSIS — N179 Acute kidney failure, unspecified: Secondary | ICD-10-CM | POA: Diagnosis not present

## 2023-08-04 DIAGNOSIS — E1122 Type 2 diabetes mellitus with diabetic chronic kidney disease: Secondary | ICD-10-CM | POA: Diagnosis not present

## 2023-08-04 DIAGNOSIS — J441 Chronic obstructive pulmonary disease with (acute) exacerbation: Secondary | ICD-10-CM | POA: Diagnosis not present

## 2023-08-04 DIAGNOSIS — I129 Hypertensive chronic kidney disease with stage 1 through stage 4 chronic kidney disease, or unspecified chronic kidney disease: Secondary | ICD-10-CM | POA: Diagnosis not present

## 2023-08-04 DIAGNOSIS — N1831 Chronic kidney disease, stage 3a: Secondary | ICD-10-CM | POA: Diagnosis not present

## 2023-08-04 DIAGNOSIS — D72829 Elevated white blood cell count, unspecified: Secondary | ICD-10-CM | POA: Diagnosis not present

## 2023-08-04 DIAGNOSIS — D631 Anemia in chronic kidney disease: Secondary | ICD-10-CM | POA: Diagnosis not present

## 2023-08-04 DIAGNOSIS — E1165 Type 2 diabetes mellitus with hyperglycemia: Secondary | ICD-10-CM | POA: Diagnosis not present

## 2023-08-04 DIAGNOSIS — J9601 Acute respiratory failure with hypoxia: Secondary | ICD-10-CM | POA: Diagnosis not present

## 2023-08-11 DIAGNOSIS — N1831 Chronic kidney disease, stage 3a: Secondary | ICD-10-CM | POA: Diagnosis not present

## 2023-08-11 DIAGNOSIS — E1122 Type 2 diabetes mellitus with diabetic chronic kidney disease: Secondary | ICD-10-CM | POA: Diagnosis not present

## 2023-08-11 DIAGNOSIS — D631 Anemia in chronic kidney disease: Secondary | ICD-10-CM | POA: Diagnosis not present

## 2023-08-11 DIAGNOSIS — D72829 Elevated white blood cell count, unspecified: Secondary | ICD-10-CM | POA: Diagnosis not present

## 2023-08-11 DIAGNOSIS — J9601 Acute respiratory failure with hypoxia: Secondary | ICD-10-CM | POA: Diagnosis not present

## 2023-08-11 DIAGNOSIS — N179 Acute kidney failure, unspecified: Secondary | ICD-10-CM | POA: Diagnosis not present

## 2023-08-11 DIAGNOSIS — E1165 Type 2 diabetes mellitus with hyperglycemia: Secondary | ICD-10-CM | POA: Diagnosis not present

## 2023-08-11 DIAGNOSIS — I129 Hypertensive chronic kidney disease with stage 1 through stage 4 chronic kidney disease, or unspecified chronic kidney disease: Secondary | ICD-10-CM | POA: Diagnosis not present

## 2023-08-11 DIAGNOSIS — J441 Chronic obstructive pulmonary disease with (acute) exacerbation: Secondary | ICD-10-CM | POA: Diagnosis not present

## 2023-08-16 DIAGNOSIS — D72829 Elevated white blood cell count, unspecified: Secondary | ICD-10-CM | POA: Diagnosis not present

## 2023-08-16 DIAGNOSIS — E1122 Type 2 diabetes mellitus with diabetic chronic kidney disease: Secondary | ICD-10-CM | POA: Diagnosis not present

## 2023-08-16 DIAGNOSIS — N1831 Chronic kidney disease, stage 3a: Secondary | ICD-10-CM | POA: Diagnosis not present

## 2023-08-16 DIAGNOSIS — J441 Chronic obstructive pulmonary disease with (acute) exacerbation: Secondary | ICD-10-CM | POA: Diagnosis not present

## 2023-08-16 DIAGNOSIS — I129 Hypertensive chronic kidney disease with stage 1 through stage 4 chronic kidney disease, or unspecified chronic kidney disease: Secondary | ICD-10-CM | POA: Diagnosis not present

## 2023-08-16 DIAGNOSIS — E1165 Type 2 diabetes mellitus with hyperglycemia: Secondary | ICD-10-CM | POA: Diagnosis not present

## 2023-08-16 DIAGNOSIS — D631 Anemia in chronic kidney disease: Secondary | ICD-10-CM | POA: Diagnosis not present

## 2023-08-16 DIAGNOSIS — J9601 Acute respiratory failure with hypoxia: Secondary | ICD-10-CM | POA: Diagnosis not present

## 2023-08-16 DIAGNOSIS — N179 Acute kidney failure, unspecified: Secondary | ICD-10-CM | POA: Diagnosis not present

## 2023-08-17 DIAGNOSIS — E1065 Type 1 diabetes mellitus with hyperglycemia: Secondary | ICD-10-CM | POA: Diagnosis not present

## 2023-08-17 DIAGNOSIS — J9601 Acute respiratory failure with hypoxia: Secondary | ICD-10-CM | POA: Diagnosis not present

## 2023-08-31 DIAGNOSIS — N1831 Chronic kidney disease, stage 3a: Secondary | ICD-10-CM | POA: Diagnosis not present

## 2023-08-31 DIAGNOSIS — I129 Hypertensive chronic kidney disease with stage 1 through stage 4 chronic kidney disease, or unspecified chronic kidney disease: Secondary | ICD-10-CM | POA: Diagnosis not present

## 2023-08-31 DIAGNOSIS — D631 Anemia in chronic kidney disease: Secondary | ICD-10-CM | POA: Diagnosis not present

## 2023-08-31 DIAGNOSIS — J9601 Acute respiratory failure with hypoxia: Secondary | ICD-10-CM | POA: Diagnosis not present

## 2023-08-31 DIAGNOSIS — N179 Acute kidney failure, unspecified: Secondary | ICD-10-CM | POA: Diagnosis not present

## 2023-08-31 DIAGNOSIS — E1165 Type 2 diabetes mellitus with hyperglycemia: Secondary | ICD-10-CM | POA: Diagnosis not present

## 2023-08-31 DIAGNOSIS — J441 Chronic obstructive pulmonary disease with (acute) exacerbation: Secondary | ICD-10-CM | POA: Diagnosis not present

## 2023-08-31 DIAGNOSIS — D72829 Elevated white blood cell count, unspecified: Secondary | ICD-10-CM | POA: Diagnosis not present

## 2023-08-31 DIAGNOSIS — E1122 Type 2 diabetes mellitus with diabetic chronic kidney disease: Secondary | ICD-10-CM | POA: Diagnosis not present

## 2023-09-07 DIAGNOSIS — I129 Hypertensive chronic kidney disease with stage 1 through stage 4 chronic kidney disease, or unspecified chronic kidney disease: Secondary | ICD-10-CM | POA: Diagnosis not present

## 2023-09-07 DIAGNOSIS — N2581 Secondary hyperparathyroidism of renal origin: Secondary | ICD-10-CM | POA: Diagnosis not present

## 2023-09-07 DIAGNOSIS — R609 Edema, unspecified: Secondary | ICD-10-CM | POA: Diagnosis not present

## 2023-09-07 DIAGNOSIS — E1121 Type 2 diabetes mellitus with diabetic nephropathy: Secondary | ICD-10-CM | POA: Diagnosis not present

## 2023-09-07 DIAGNOSIS — R809 Proteinuria, unspecified: Secondary | ICD-10-CM | POA: Diagnosis not present

## 2023-09-07 DIAGNOSIS — E1122 Type 2 diabetes mellitus with diabetic chronic kidney disease: Secondary | ICD-10-CM | POA: Diagnosis not present

## 2023-09-07 DIAGNOSIS — N184 Chronic kidney disease, stage 4 (severe): Secondary | ICD-10-CM | POA: Diagnosis not present

## 2023-09-14 DIAGNOSIS — Z85118 Personal history of other malignant neoplasm of bronchus and lung: Secondary | ICD-10-CM | POA: Diagnosis not present

## 2023-09-14 DIAGNOSIS — E785 Hyperlipidemia, unspecified: Secondary | ICD-10-CM | POA: Diagnosis not present

## 2023-09-14 DIAGNOSIS — Z7984 Long term (current) use of oral hypoglycemic drugs: Secondary | ICD-10-CM | POA: Diagnosis not present

## 2023-09-14 DIAGNOSIS — M109 Gout, unspecified: Secondary | ICD-10-CM | POA: Diagnosis not present

## 2023-09-14 DIAGNOSIS — I129 Hypertensive chronic kidney disease with stage 1 through stage 4 chronic kidney disease, or unspecified chronic kidney disease: Secondary | ICD-10-CM | POA: Diagnosis not present

## 2023-09-14 DIAGNOSIS — J441 Chronic obstructive pulmonary disease with (acute) exacerbation: Secondary | ICD-10-CM | POA: Diagnosis not present

## 2023-09-14 DIAGNOSIS — Z9981 Dependence on supplemental oxygen: Secondary | ICD-10-CM | POA: Diagnosis not present

## 2023-09-14 DIAGNOSIS — Z87891 Personal history of nicotine dependence: Secondary | ICD-10-CM | POA: Diagnosis not present

## 2023-09-14 DIAGNOSIS — E1122 Type 2 diabetes mellitus with diabetic chronic kidney disease: Secondary | ICD-10-CM | POA: Diagnosis not present

## 2023-09-14 DIAGNOSIS — Z7952 Long term (current) use of systemic steroids: Secondary | ICD-10-CM | POA: Diagnosis not present

## 2023-09-14 DIAGNOSIS — Z7982 Long term (current) use of aspirin: Secondary | ICD-10-CM | POA: Diagnosis not present

## 2023-09-14 DIAGNOSIS — Z7951 Long term (current) use of inhaled steroids: Secondary | ICD-10-CM | POA: Diagnosis not present

## 2023-09-14 DIAGNOSIS — N1831 Chronic kidney disease, stage 3a: Secondary | ICD-10-CM | POA: Diagnosis not present

## 2023-09-15 DIAGNOSIS — Z87891 Personal history of nicotine dependence: Secondary | ICD-10-CM | POA: Diagnosis not present

## 2023-09-15 DIAGNOSIS — N1831 Chronic kidney disease, stage 3a: Secondary | ICD-10-CM | POA: Diagnosis not present

## 2023-09-15 DIAGNOSIS — Z9981 Dependence on supplemental oxygen: Secondary | ICD-10-CM | POA: Diagnosis not present

## 2023-09-15 DIAGNOSIS — Z7982 Long term (current) use of aspirin: Secondary | ICD-10-CM | POA: Diagnosis not present

## 2023-09-15 DIAGNOSIS — Z7984 Long term (current) use of oral hypoglycemic drugs: Secondary | ICD-10-CM | POA: Diagnosis not present

## 2023-09-15 DIAGNOSIS — Z7951 Long term (current) use of inhaled steroids: Secondary | ICD-10-CM | POA: Diagnosis not present

## 2023-09-15 DIAGNOSIS — Z7952 Long term (current) use of systemic steroids: Secondary | ICD-10-CM | POA: Diagnosis not present

## 2023-09-15 DIAGNOSIS — E785 Hyperlipidemia, unspecified: Secondary | ICD-10-CM | POA: Diagnosis not present

## 2023-09-15 DIAGNOSIS — I129 Hypertensive chronic kidney disease with stage 1 through stage 4 chronic kidney disease, or unspecified chronic kidney disease: Secondary | ICD-10-CM | POA: Diagnosis not present

## 2023-09-15 DIAGNOSIS — M109 Gout, unspecified: Secondary | ICD-10-CM | POA: Diagnosis not present

## 2023-09-15 DIAGNOSIS — Z85118 Personal history of other malignant neoplasm of bronchus and lung: Secondary | ICD-10-CM | POA: Diagnosis not present

## 2023-09-15 DIAGNOSIS — E1122 Type 2 diabetes mellitus with diabetic chronic kidney disease: Secondary | ICD-10-CM | POA: Diagnosis not present

## 2023-09-15 DIAGNOSIS — J441 Chronic obstructive pulmonary disease with (acute) exacerbation: Secondary | ICD-10-CM | POA: Diagnosis not present

## 2023-09-20 DIAGNOSIS — E785 Hyperlipidemia, unspecified: Secondary | ICD-10-CM | POA: Diagnosis not present

## 2023-09-20 DIAGNOSIS — Z7952 Long term (current) use of systemic steroids: Secondary | ICD-10-CM | POA: Diagnosis not present

## 2023-09-20 DIAGNOSIS — M109 Gout, unspecified: Secondary | ICD-10-CM | POA: Diagnosis not present

## 2023-09-20 DIAGNOSIS — Z7951 Long term (current) use of inhaled steroids: Secondary | ICD-10-CM | POA: Diagnosis not present

## 2023-09-20 DIAGNOSIS — N1831 Chronic kidney disease, stage 3a: Secondary | ICD-10-CM | POA: Diagnosis not present

## 2023-09-20 DIAGNOSIS — J441 Chronic obstructive pulmonary disease with (acute) exacerbation: Secondary | ICD-10-CM | POA: Diagnosis not present

## 2023-09-20 DIAGNOSIS — Z7984 Long term (current) use of oral hypoglycemic drugs: Secondary | ICD-10-CM | POA: Diagnosis not present

## 2023-09-20 DIAGNOSIS — Z85118 Personal history of other malignant neoplasm of bronchus and lung: Secondary | ICD-10-CM | POA: Diagnosis not present

## 2023-09-20 DIAGNOSIS — Z87891 Personal history of nicotine dependence: Secondary | ICD-10-CM | POA: Diagnosis not present

## 2023-09-20 DIAGNOSIS — Z9981 Dependence on supplemental oxygen: Secondary | ICD-10-CM | POA: Diagnosis not present

## 2023-09-20 DIAGNOSIS — I129 Hypertensive chronic kidney disease with stage 1 through stage 4 chronic kidney disease, or unspecified chronic kidney disease: Secondary | ICD-10-CM | POA: Diagnosis not present

## 2023-09-20 DIAGNOSIS — E1122 Type 2 diabetes mellitus with diabetic chronic kidney disease: Secondary | ICD-10-CM | POA: Diagnosis not present

## 2023-09-20 DIAGNOSIS — Z7982 Long term (current) use of aspirin: Secondary | ICD-10-CM | POA: Diagnosis not present

## 2023-09-27 DIAGNOSIS — Z7982 Long term (current) use of aspirin: Secondary | ICD-10-CM | POA: Diagnosis not present

## 2023-09-27 DIAGNOSIS — Z7951 Long term (current) use of inhaled steroids: Secondary | ICD-10-CM | POA: Diagnosis not present

## 2023-09-27 DIAGNOSIS — Z7984 Long term (current) use of oral hypoglycemic drugs: Secondary | ICD-10-CM | POA: Diagnosis not present

## 2023-09-27 DIAGNOSIS — J441 Chronic obstructive pulmonary disease with (acute) exacerbation: Secondary | ICD-10-CM | POA: Diagnosis not present

## 2023-09-27 DIAGNOSIS — Z9981 Dependence on supplemental oxygen: Secondary | ICD-10-CM | POA: Diagnosis not present

## 2023-09-27 DIAGNOSIS — Z87891 Personal history of nicotine dependence: Secondary | ICD-10-CM | POA: Diagnosis not present

## 2023-09-27 DIAGNOSIS — N1831 Chronic kidney disease, stage 3a: Secondary | ICD-10-CM | POA: Diagnosis not present

## 2023-09-27 DIAGNOSIS — E785 Hyperlipidemia, unspecified: Secondary | ICD-10-CM | POA: Diagnosis not present

## 2023-09-27 DIAGNOSIS — I129 Hypertensive chronic kidney disease with stage 1 through stage 4 chronic kidney disease, or unspecified chronic kidney disease: Secondary | ICD-10-CM | POA: Diagnosis not present

## 2023-09-27 DIAGNOSIS — E1122 Type 2 diabetes mellitus with diabetic chronic kidney disease: Secondary | ICD-10-CM | POA: Diagnosis not present

## 2023-09-27 DIAGNOSIS — Z85118 Personal history of other malignant neoplasm of bronchus and lung: Secondary | ICD-10-CM | POA: Diagnosis not present

## 2023-09-27 DIAGNOSIS — Z7952 Long term (current) use of systemic steroids: Secondary | ICD-10-CM | POA: Diagnosis not present

## 2023-09-27 DIAGNOSIS — M109 Gout, unspecified: Secondary | ICD-10-CM | POA: Diagnosis not present

## 2023-09-28 ENCOUNTER — Other Ambulatory Visit: Payer: Self-pay | Admitting: Pulmonary Disease

## 2023-09-28 DIAGNOSIS — Z9981 Dependence on supplemental oxygen: Secondary | ICD-10-CM | POA: Diagnosis not present

## 2023-09-28 DIAGNOSIS — I129 Hypertensive chronic kidney disease with stage 1 through stage 4 chronic kidney disease, or unspecified chronic kidney disease: Secondary | ICD-10-CM | POA: Diagnosis not present

## 2023-09-28 DIAGNOSIS — M109 Gout, unspecified: Secondary | ICD-10-CM | POA: Diagnosis not present

## 2023-09-28 DIAGNOSIS — Z87891 Personal history of nicotine dependence: Secondary | ICD-10-CM | POA: Diagnosis not present

## 2023-09-28 DIAGNOSIS — Z7984 Long term (current) use of oral hypoglycemic drugs: Secondary | ICD-10-CM | POA: Diagnosis not present

## 2023-09-28 DIAGNOSIS — N1831 Chronic kidney disease, stage 3a: Secondary | ICD-10-CM | POA: Diagnosis not present

## 2023-09-28 DIAGNOSIS — J441 Chronic obstructive pulmonary disease with (acute) exacerbation: Secondary | ICD-10-CM | POA: Diagnosis not present

## 2023-09-28 DIAGNOSIS — E1122 Type 2 diabetes mellitus with diabetic chronic kidney disease: Secondary | ICD-10-CM | POA: Diagnosis not present

## 2023-09-28 DIAGNOSIS — Z7982 Long term (current) use of aspirin: Secondary | ICD-10-CM | POA: Diagnosis not present

## 2023-09-28 DIAGNOSIS — Z7952 Long term (current) use of systemic steroids: Secondary | ICD-10-CM | POA: Diagnosis not present

## 2023-09-28 DIAGNOSIS — Z85118 Personal history of other malignant neoplasm of bronchus and lung: Secondary | ICD-10-CM | POA: Diagnosis not present

## 2023-09-28 DIAGNOSIS — Z7951 Long term (current) use of inhaled steroids: Secondary | ICD-10-CM | POA: Diagnosis not present

## 2023-09-28 DIAGNOSIS — E785 Hyperlipidemia, unspecified: Secondary | ICD-10-CM | POA: Diagnosis not present

## 2023-09-28 NOTE — Telephone Encounter (Signed)
Dr. Mannam, please advise. 

## 2023-10-04 DIAGNOSIS — Z7984 Long term (current) use of oral hypoglycemic drugs: Secondary | ICD-10-CM | POA: Diagnosis not present

## 2023-10-04 DIAGNOSIS — Z7952 Long term (current) use of systemic steroids: Secondary | ICD-10-CM | POA: Diagnosis not present

## 2023-10-04 DIAGNOSIS — I129 Hypertensive chronic kidney disease with stage 1 through stage 4 chronic kidney disease, or unspecified chronic kidney disease: Secondary | ICD-10-CM | POA: Diagnosis not present

## 2023-10-04 DIAGNOSIS — M109 Gout, unspecified: Secondary | ICD-10-CM | POA: Diagnosis not present

## 2023-10-04 DIAGNOSIS — Z7951 Long term (current) use of inhaled steroids: Secondary | ICD-10-CM | POA: Diagnosis not present

## 2023-10-04 DIAGNOSIS — Z87891 Personal history of nicotine dependence: Secondary | ICD-10-CM | POA: Diagnosis not present

## 2023-10-04 DIAGNOSIS — J441 Chronic obstructive pulmonary disease with (acute) exacerbation: Secondary | ICD-10-CM | POA: Diagnosis not present

## 2023-10-04 DIAGNOSIS — Z85118 Personal history of other malignant neoplasm of bronchus and lung: Secondary | ICD-10-CM | POA: Diagnosis not present

## 2023-10-04 DIAGNOSIS — Z7982 Long term (current) use of aspirin: Secondary | ICD-10-CM | POA: Diagnosis not present

## 2023-10-04 DIAGNOSIS — E1122 Type 2 diabetes mellitus with diabetic chronic kidney disease: Secondary | ICD-10-CM | POA: Diagnosis not present

## 2023-10-04 DIAGNOSIS — N1831 Chronic kidney disease, stage 3a: Secondary | ICD-10-CM | POA: Diagnosis not present

## 2023-10-04 DIAGNOSIS — E785 Hyperlipidemia, unspecified: Secondary | ICD-10-CM | POA: Diagnosis not present

## 2023-10-04 DIAGNOSIS — Z9981 Dependence on supplemental oxygen: Secondary | ICD-10-CM | POA: Diagnosis not present

## 2023-10-12 DIAGNOSIS — Z85118 Personal history of other malignant neoplasm of bronchus and lung: Secondary | ICD-10-CM | POA: Diagnosis not present

## 2023-10-12 DIAGNOSIS — Z7952 Long term (current) use of systemic steroids: Secondary | ICD-10-CM | POA: Diagnosis not present

## 2023-10-12 DIAGNOSIS — N1831 Chronic kidney disease, stage 3a: Secondary | ICD-10-CM | POA: Diagnosis not present

## 2023-10-12 DIAGNOSIS — E1122 Type 2 diabetes mellitus with diabetic chronic kidney disease: Secondary | ICD-10-CM | POA: Diagnosis not present

## 2023-10-12 DIAGNOSIS — I129 Hypertensive chronic kidney disease with stage 1 through stage 4 chronic kidney disease, or unspecified chronic kidney disease: Secondary | ICD-10-CM | POA: Diagnosis not present

## 2023-10-12 DIAGNOSIS — E785 Hyperlipidemia, unspecified: Secondary | ICD-10-CM | POA: Diagnosis not present

## 2023-10-12 DIAGNOSIS — M109 Gout, unspecified: Secondary | ICD-10-CM | POA: Diagnosis not present

## 2023-10-12 DIAGNOSIS — Z7984 Long term (current) use of oral hypoglycemic drugs: Secondary | ICD-10-CM | POA: Diagnosis not present

## 2023-10-12 DIAGNOSIS — Z87891 Personal history of nicotine dependence: Secondary | ICD-10-CM | POA: Diagnosis not present

## 2023-10-12 DIAGNOSIS — J441 Chronic obstructive pulmonary disease with (acute) exacerbation: Secondary | ICD-10-CM | POA: Diagnosis not present

## 2023-10-12 DIAGNOSIS — Z7982 Long term (current) use of aspirin: Secondary | ICD-10-CM | POA: Diagnosis not present

## 2023-10-12 DIAGNOSIS — Z9981 Dependence on supplemental oxygen: Secondary | ICD-10-CM | POA: Diagnosis not present

## 2023-10-12 DIAGNOSIS — Z7951 Long term (current) use of inhaled steroids: Secondary | ICD-10-CM | POA: Diagnosis not present

## 2023-10-14 DIAGNOSIS — E1122 Type 2 diabetes mellitus with diabetic chronic kidney disease: Secondary | ICD-10-CM | POA: Diagnosis not present

## 2023-10-14 DIAGNOSIS — E785 Hyperlipidemia, unspecified: Secondary | ICD-10-CM | POA: Diagnosis not present

## 2023-10-14 DIAGNOSIS — I129 Hypertensive chronic kidney disease with stage 1 through stage 4 chronic kidney disease, or unspecified chronic kidney disease: Secondary | ICD-10-CM | POA: Diagnosis not present

## 2023-10-14 DIAGNOSIS — Z85118 Personal history of other malignant neoplasm of bronchus and lung: Secondary | ICD-10-CM | POA: Diagnosis not present

## 2023-10-14 DIAGNOSIS — M109 Gout, unspecified: Secondary | ICD-10-CM | POA: Diagnosis not present

## 2023-10-14 DIAGNOSIS — Z7952 Long term (current) use of systemic steroids: Secondary | ICD-10-CM | POA: Diagnosis not present

## 2023-10-14 DIAGNOSIS — Z7984 Long term (current) use of oral hypoglycemic drugs: Secondary | ICD-10-CM | POA: Diagnosis not present

## 2023-10-14 DIAGNOSIS — Z9981 Dependence on supplemental oxygen: Secondary | ICD-10-CM | POA: Diagnosis not present

## 2023-10-14 DIAGNOSIS — Z7951 Long term (current) use of inhaled steroids: Secondary | ICD-10-CM | POA: Diagnosis not present

## 2023-10-14 DIAGNOSIS — Z7982 Long term (current) use of aspirin: Secondary | ICD-10-CM | POA: Diagnosis not present

## 2023-10-14 DIAGNOSIS — J441 Chronic obstructive pulmonary disease with (acute) exacerbation: Secondary | ICD-10-CM | POA: Diagnosis not present

## 2023-10-14 DIAGNOSIS — Z87891 Personal history of nicotine dependence: Secondary | ICD-10-CM | POA: Diagnosis not present

## 2023-10-14 DIAGNOSIS — N1831 Chronic kidney disease, stage 3a: Secondary | ICD-10-CM | POA: Diagnosis not present

## 2023-10-18 DIAGNOSIS — Z7952 Long term (current) use of systemic steroids: Secondary | ICD-10-CM | POA: Diagnosis not present

## 2023-10-18 DIAGNOSIS — Z7984 Long term (current) use of oral hypoglycemic drugs: Secondary | ICD-10-CM | POA: Diagnosis not present

## 2023-10-18 DIAGNOSIS — M109 Gout, unspecified: Secondary | ICD-10-CM | POA: Diagnosis not present

## 2023-10-18 DIAGNOSIS — Z7982 Long term (current) use of aspirin: Secondary | ICD-10-CM | POA: Diagnosis not present

## 2023-10-18 DIAGNOSIS — N1831 Chronic kidney disease, stage 3a: Secondary | ICD-10-CM | POA: Diagnosis not present

## 2023-10-18 DIAGNOSIS — Z85118 Personal history of other malignant neoplasm of bronchus and lung: Secondary | ICD-10-CM | POA: Diagnosis not present

## 2023-10-18 DIAGNOSIS — Z87891 Personal history of nicotine dependence: Secondary | ICD-10-CM | POA: Diagnosis not present

## 2023-10-18 DIAGNOSIS — Z9981 Dependence on supplemental oxygen: Secondary | ICD-10-CM | POA: Diagnosis not present

## 2023-10-18 DIAGNOSIS — N184 Chronic kidney disease, stage 4 (severe): Secondary | ICD-10-CM | POA: Diagnosis not present

## 2023-10-18 DIAGNOSIS — E785 Hyperlipidemia, unspecified: Secondary | ICD-10-CM | POA: Diagnosis not present

## 2023-10-18 DIAGNOSIS — E1122 Type 2 diabetes mellitus with diabetic chronic kidney disease: Secondary | ICD-10-CM | POA: Diagnosis not present

## 2023-10-18 DIAGNOSIS — Z6820 Body mass index (BMI) 20.0-20.9, adult: Secondary | ICD-10-CM | POA: Diagnosis not present

## 2023-10-18 DIAGNOSIS — Z794 Long term (current) use of insulin: Secondary | ICD-10-CM | POA: Diagnosis not present

## 2023-10-18 DIAGNOSIS — J449 Chronic obstructive pulmonary disease, unspecified: Secondary | ICD-10-CM | POA: Diagnosis not present

## 2023-10-18 DIAGNOSIS — J441 Chronic obstructive pulmonary disease with (acute) exacerbation: Secondary | ICD-10-CM | POA: Diagnosis not present

## 2023-10-18 DIAGNOSIS — I129 Hypertensive chronic kidney disease with stage 1 through stage 4 chronic kidney disease, or unspecified chronic kidney disease: Secondary | ICD-10-CM | POA: Diagnosis not present

## 2023-10-18 DIAGNOSIS — Z7951 Long term (current) use of inhaled steroids: Secondary | ICD-10-CM | POA: Diagnosis not present

## 2023-10-18 DIAGNOSIS — E1165 Type 2 diabetes mellitus with hyperglycemia: Secondary | ICD-10-CM | POA: Diagnosis not present

## 2023-10-25 DIAGNOSIS — I129 Hypertensive chronic kidney disease with stage 1 through stage 4 chronic kidney disease, or unspecified chronic kidney disease: Secondary | ICD-10-CM | POA: Diagnosis not present

## 2023-10-25 DIAGNOSIS — E1122 Type 2 diabetes mellitus with diabetic chronic kidney disease: Secondary | ICD-10-CM | POA: Diagnosis not present

## 2023-10-25 DIAGNOSIS — Z7982 Long term (current) use of aspirin: Secondary | ICD-10-CM | POA: Diagnosis not present

## 2023-10-25 DIAGNOSIS — Z7952 Long term (current) use of systemic steroids: Secondary | ICD-10-CM | POA: Diagnosis not present

## 2023-10-25 DIAGNOSIS — E785 Hyperlipidemia, unspecified: Secondary | ICD-10-CM | POA: Diagnosis not present

## 2023-10-25 DIAGNOSIS — Z9981 Dependence on supplemental oxygen: Secondary | ICD-10-CM | POA: Diagnosis not present

## 2023-10-25 DIAGNOSIS — M109 Gout, unspecified: Secondary | ICD-10-CM | POA: Diagnosis not present

## 2023-10-25 DIAGNOSIS — Z85118 Personal history of other malignant neoplasm of bronchus and lung: Secondary | ICD-10-CM | POA: Diagnosis not present

## 2023-10-25 DIAGNOSIS — Z7951 Long term (current) use of inhaled steroids: Secondary | ICD-10-CM | POA: Diagnosis not present

## 2023-10-25 DIAGNOSIS — Z87891 Personal history of nicotine dependence: Secondary | ICD-10-CM | POA: Diagnosis not present

## 2023-10-25 DIAGNOSIS — Z7984 Long term (current) use of oral hypoglycemic drugs: Secondary | ICD-10-CM | POA: Diagnosis not present

## 2023-10-25 DIAGNOSIS — N1831 Chronic kidney disease, stage 3a: Secondary | ICD-10-CM | POA: Diagnosis not present

## 2023-10-25 DIAGNOSIS — J441 Chronic obstructive pulmonary disease with (acute) exacerbation: Secondary | ICD-10-CM | POA: Diagnosis not present

## 2023-11-07 ENCOUNTER — Encounter

## 2023-11-07 ENCOUNTER — Ambulatory Visit: Admitting: Pulmonary Disease

## 2023-11-09 DIAGNOSIS — Z7952 Long term (current) use of systemic steroids: Secondary | ICD-10-CM | POA: Diagnosis not present

## 2023-11-09 DIAGNOSIS — Z7951 Long term (current) use of inhaled steroids: Secondary | ICD-10-CM | POA: Diagnosis not present

## 2023-11-09 DIAGNOSIS — I129 Hypertensive chronic kidney disease with stage 1 through stage 4 chronic kidney disease, or unspecified chronic kidney disease: Secondary | ICD-10-CM | POA: Diagnosis not present

## 2023-11-09 DIAGNOSIS — E1122 Type 2 diabetes mellitus with diabetic chronic kidney disease: Secondary | ICD-10-CM | POA: Diagnosis not present

## 2023-11-09 DIAGNOSIS — N1831 Chronic kidney disease, stage 3a: Secondary | ICD-10-CM | POA: Diagnosis not present

## 2023-11-09 DIAGNOSIS — Z9981 Dependence on supplemental oxygen: Secondary | ICD-10-CM | POA: Diagnosis not present

## 2023-11-09 DIAGNOSIS — J441 Chronic obstructive pulmonary disease with (acute) exacerbation: Secondary | ICD-10-CM | POA: Diagnosis not present

## 2023-11-09 DIAGNOSIS — Z7982 Long term (current) use of aspirin: Secondary | ICD-10-CM | POA: Diagnosis not present

## 2023-11-09 DIAGNOSIS — M109 Gout, unspecified: Secondary | ICD-10-CM | POA: Diagnosis not present

## 2023-11-09 DIAGNOSIS — Z87891 Personal history of nicotine dependence: Secondary | ICD-10-CM | POA: Diagnosis not present

## 2023-11-09 DIAGNOSIS — E785 Hyperlipidemia, unspecified: Secondary | ICD-10-CM | POA: Diagnosis not present

## 2023-11-09 DIAGNOSIS — Z7984 Long term (current) use of oral hypoglycemic drugs: Secondary | ICD-10-CM | POA: Diagnosis not present

## 2023-11-09 DIAGNOSIS — Z85118 Personal history of other malignant neoplasm of bronchus and lung: Secondary | ICD-10-CM | POA: Diagnosis not present

## 2023-11-13 DIAGNOSIS — E1122 Type 2 diabetes mellitus with diabetic chronic kidney disease: Secondary | ICD-10-CM | POA: Diagnosis not present

## 2023-11-13 DIAGNOSIS — E785 Hyperlipidemia, unspecified: Secondary | ICD-10-CM | POA: Diagnosis not present

## 2023-11-13 DIAGNOSIS — Z556 Problems related to health literacy: Secondary | ICD-10-CM | POA: Diagnosis not present

## 2023-11-13 DIAGNOSIS — Z794 Long term (current) use of insulin: Secondary | ICD-10-CM | POA: Diagnosis not present

## 2023-11-13 DIAGNOSIS — Z7982 Long term (current) use of aspirin: Secondary | ICD-10-CM | POA: Diagnosis not present

## 2023-11-13 DIAGNOSIS — I129 Hypertensive chronic kidney disease with stage 1 through stage 4 chronic kidney disease, or unspecified chronic kidney disease: Secondary | ICD-10-CM | POA: Diagnosis not present

## 2023-11-13 DIAGNOSIS — Z9981 Dependence on supplemental oxygen: Secondary | ICD-10-CM | POA: Diagnosis not present

## 2023-11-13 DIAGNOSIS — Z87891 Personal history of nicotine dependence: Secondary | ICD-10-CM | POA: Diagnosis not present

## 2023-11-13 DIAGNOSIS — M109 Gout, unspecified: Secondary | ICD-10-CM | POA: Diagnosis not present

## 2023-11-13 DIAGNOSIS — N1831 Chronic kidney disease, stage 3a: Secondary | ICD-10-CM | POA: Diagnosis not present

## 2023-11-13 DIAGNOSIS — Z7984 Long term (current) use of oral hypoglycemic drugs: Secondary | ICD-10-CM | POA: Diagnosis not present

## 2023-11-13 DIAGNOSIS — Z85118 Personal history of other malignant neoplasm of bronchus and lung: Secondary | ICD-10-CM | POA: Diagnosis not present

## 2023-11-14 DIAGNOSIS — Z556 Problems related to health literacy: Secondary | ICD-10-CM | POA: Diagnosis not present

## 2023-11-14 DIAGNOSIS — Z7984 Long term (current) use of oral hypoglycemic drugs: Secondary | ICD-10-CM | POA: Diagnosis not present

## 2023-11-14 DIAGNOSIS — Z87891 Personal history of nicotine dependence: Secondary | ICD-10-CM | POA: Diagnosis not present

## 2023-11-14 DIAGNOSIS — Z85118 Personal history of other malignant neoplasm of bronchus and lung: Secondary | ICD-10-CM | POA: Diagnosis not present

## 2023-11-14 DIAGNOSIS — I129 Hypertensive chronic kidney disease with stage 1 through stage 4 chronic kidney disease, or unspecified chronic kidney disease: Secondary | ICD-10-CM | POA: Diagnosis not present

## 2023-11-14 DIAGNOSIS — J449 Chronic obstructive pulmonary disease, unspecified: Secondary | ICD-10-CM | POA: Diagnosis not present

## 2023-11-14 DIAGNOSIS — N1831 Chronic kidney disease, stage 3a: Secondary | ICD-10-CM | POA: Diagnosis not present

## 2023-11-14 DIAGNOSIS — Z9981 Dependence on supplemental oxygen: Secondary | ICD-10-CM | POA: Diagnosis not present

## 2023-11-14 DIAGNOSIS — E1122 Type 2 diabetes mellitus with diabetic chronic kidney disease: Secondary | ICD-10-CM | POA: Diagnosis not present

## 2023-11-14 DIAGNOSIS — Z7982 Long term (current) use of aspirin: Secondary | ICD-10-CM | POA: Diagnosis not present

## 2023-11-14 DIAGNOSIS — E785 Hyperlipidemia, unspecified: Secondary | ICD-10-CM | POA: Diagnosis not present

## 2023-11-14 DIAGNOSIS — Z794 Long term (current) use of insulin: Secondary | ICD-10-CM | POA: Diagnosis not present

## 2023-11-14 DIAGNOSIS — M109 Gout, unspecified: Secondary | ICD-10-CM | POA: Diagnosis not present

## 2023-11-15 DIAGNOSIS — E1065 Type 1 diabetes mellitus with hyperglycemia: Secondary | ICD-10-CM | POA: Diagnosis not present

## 2023-11-15 DIAGNOSIS — E109 Type 1 diabetes mellitus without complications: Secondary | ICD-10-CM | POA: Diagnosis not present

## 2023-11-22 DIAGNOSIS — I129 Hypertensive chronic kidney disease with stage 1 through stage 4 chronic kidney disease, or unspecified chronic kidney disease: Secondary | ICD-10-CM | POA: Diagnosis not present

## 2023-11-22 DIAGNOSIS — N1831 Chronic kidney disease, stage 3a: Secondary | ICD-10-CM | POA: Diagnosis not present

## 2023-11-22 DIAGNOSIS — Z794 Long term (current) use of insulin: Secondary | ICD-10-CM | POA: Diagnosis not present

## 2023-11-22 DIAGNOSIS — J449 Chronic obstructive pulmonary disease, unspecified: Secondary | ICD-10-CM | POA: Diagnosis not present

## 2023-11-22 DIAGNOSIS — E1122 Type 2 diabetes mellitus with diabetic chronic kidney disease: Secondary | ICD-10-CM | POA: Diagnosis not present

## 2023-11-22 DIAGNOSIS — Z87891 Personal history of nicotine dependence: Secondary | ICD-10-CM | POA: Diagnosis not present

## 2023-11-22 DIAGNOSIS — Z85118 Personal history of other malignant neoplasm of bronchus and lung: Secondary | ICD-10-CM | POA: Diagnosis not present

## 2023-11-22 DIAGNOSIS — Z7982 Long term (current) use of aspirin: Secondary | ICD-10-CM | POA: Diagnosis not present

## 2023-11-22 DIAGNOSIS — Z9981 Dependence on supplemental oxygen: Secondary | ICD-10-CM | POA: Diagnosis not present

## 2023-11-22 DIAGNOSIS — Z7984 Long term (current) use of oral hypoglycemic drugs: Secondary | ICD-10-CM | POA: Diagnosis not present

## 2023-11-22 DIAGNOSIS — E785 Hyperlipidemia, unspecified: Secondary | ICD-10-CM | POA: Diagnosis not present

## 2023-11-22 DIAGNOSIS — Z556 Problems related to health literacy: Secondary | ICD-10-CM | POA: Diagnosis not present

## 2023-11-22 DIAGNOSIS — M109 Gout, unspecified: Secondary | ICD-10-CM | POA: Diagnosis not present

## 2023-11-29 ENCOUNTER — Other Ambulatory Visit: Payer: Self-pay

## 2023-11-29 ENCOUNTER — Inpatient Hospital Stay (HOSPITAL_COMMUNITY)
Admission: EM | Admit: 2023-11-29 | Discharge: 2023-12-02 | DRG: 175 | Disposition: A | Discharge status: Home Health | Attending: Family Medicine | Admitting: Family Medicine

## 2023-11-29 ENCOUNTER — Emergency Department (HOSPITAL_COMMUNITY)

## 2023-11-29 ENCOUNTER — Encounter (HOSPITAL_COMMUNITY): Payer: Self-pay

## 2023-11-29 DIAGNOSIS — Z86711 Personal history of pulmonary embolism: Secondary | ICD-10-CM | POA: Diagnosis not present

## 2023-11-29 DIAGNOSIS — M109 Gout, unspecified: Secondary | ICD-10-CM | POA: Diagnosis present

## 2023-11-29 DIAGNOSIS — Z923 Personal history of irradiation: Secondary | ICD-10-CM | POA: Diagnosis not present

## 2023-11-29 DIAGNOSIS — N189 Chronic kidney disease, unspecified: Secondary | ICD-10-CM | POA: Diagnosis not present

## 2023-11-29 DIAGNOSIS — Z886 Allergy status to analgesic agent status: Secondary | ICD-10-CM | POA: Diagnosis not present

## 2023-11-29 DIAGNOSIS — R0682 Tachypnea, not elsewhere classified: Secondary | ICD-10-CM | POA: Diagnosis not present

## 2023-11-29 DIAGNOSIS — D509 Iron deficiency anemia, unspecified: Secondary | ICD-10-CM | POA: Diagnosis not present

## 2023-11-29 DIAGNOSIS — Z79899 Other long term (current) drug therapy: Secondary | ICD-10-CM

## 2023-11-29 DIAGNOSIS — E1165 Type 2 diabetes mellitus with hyperglycemia: Secondary | ICD-10-CM | POA: Diagnosis not present

## 2023-11-29 DIAGNOSIS — Z888 Allergy status to other drugs, medicaments and biological substances status: Secondary | ICD-10-CM

## 2023-11-29 DIAGNOSIS — J441 Chronic obstructive pulmonary disease with (acute) exacerbation: Principal | ICD-10-CM | POA: Diagnosis present

## 2023-11-29 DIAGNOSIS — R0602 Shortness of breath: Secondary | ICD-10-CM | POA: Diagnosis not present

## 2023-11-29 DIAGNOSIS — Z6820 Body mass index (BMI) 20.0-20.9, adult: Secondary | ICD-10-CM

## 2023-11-29 DIAGNOSIS — E785 Hyperlipidemia, unspecified: Secondary | ICD-10-CM | POA: Diagnosis present

## 2023-11-29 DIAGNOSIS — E7849 Other hyperlipidemia: Secondary | ICD-10-CM | POA: Diagnosis not present

## 2023-11-29 DIAGNOSIS — Z85118 Personal history of other malignant neoplasm of bronchus and lung: Secondary | ICD-10-CM | POA: Diagnosis not present

## 2023-11-29 DIAGNOSIS — J9611 Chronic respiratory failure with hypoxia: Secondary | ICD-10-CM | POA: Insufficient documentation

## 2023-11-29 DIAGNOSIS — F1021 Alcohol dependence, in remission: Secondary | ICD-10-CM | POA: Diagnosis present

## 2023-11-29 DIAGNOSIS — F17210 Nicotine dependence, cigarettes, uncomplicated: Secondary | ICD-10-CM | POA: Diagnosis present

## 2023-11-29 DIAGNOSIS — Z794 Long term (current) use of insulin: Secondary | ICD-10-CM | POA: Diagnosis not present

## 2023-11-29 DIAGNOSIS — D631 Anemia in chronic kidney disease: Secondary | ICD-10-CM | POA: Diagnosis present

## 2023-11-29 DIAGNOSIS — D649 Anemia, unspecified: Secondary | ICD-10-CM | POA: Insufficient documentation

## 2023-11-29 DIAGNOSIS — N400 Enlarged prostate without lower urinary tract symptoms: Secondary | ICD-10-CM | POA: Diagnosis present

## 2023-11-29 DIAGNOSIS — E119 Type 2 diabetes mellitus without complications: Secondary | ICD-10-CM

## 2023-11-29 DIAGNOSIS — E782 Mixed hyperlipidemia: Secondary | ICD-10-CM | POA: Diagnosis not present

## 2023-11-29 DIAGNOSIS — E1122 Type 2 diabetes mellitus with diabetic chronic kidney disease: Secondary | ICD-10-CM | POA: Diagnosis present

## 2023-11-29 DIAGNOSIS — Z801 Family history of malignant neoplasm of trachea, bronchus and lung: Secondary | ICD-10-CM

## 2023-11-29 DIAGNOSIS — N179 Acute kidney failure, unspecified: Secondary | ICD-10-CM | POA: Diagnosis not present

## 2023-11-29 DIAGNOSIS — I2699 Other pulmonary embolism without acute cor pulmonale: Principal | ICD-10-CM | POA: Diagnosis present

## 2023-11-29 DIAGNOSIS — N184 Chronic kidney disease, stage 4 (severe): Secondary | ICD-10-CM | POA: Diagnosis present

## 2023-11-29 DIAGNOSIS — Z91199 Patient's noncompliance with other medical treatment and regimen due to unspecified reason: Secondary | ICD-10-CM

## 2023-11-29 DIAGNOSIS — I700 Atherosclerosis of aorta: Secondary | ICD-10-CM | POA: Diagnosis not present

## 2023-11-29 DIAGNOSIS — Z7984 Long term (current) use of oral hypoglycemic drugs: Secondary | ICD-10-CM

## 2023-11-29 DIAGNOSIS — J9621 Acute and chronic respiratory failure with hypoxia: Secondary | ICD-10-CM | POA: Diagnosis present

## 2023-11-29 DIAGNOSIS — R0902 Hypoxemia: Secondary | ICD-10-CM | POA: Diagnosis not present

## 2023-11-29 DIAGNOSIS — Z7951 Long term (current) use of inhaled steroids: Secondary | ICD-10-CM

## 2023-11-29 DIAGNOSIS — Z87438 Personal history of other diseases of male genital organs: Secondary | ICD-10-CM | POA: Diagnosis not present

## 2023-11-29 DIAGNOSIS — E43 Unspecified severe protein-calorie malnutrition: Secondary | ICD-10-CM | POA: Diagnosis present

## 2023-11-29 DIAGNOSIS — Z1152 Encounter for screening for COVID-19: Secondary | ICD-10-CM

## 2023-11-29 DIAGNOSIS — I129 Hypertensive chronic kidney disease with stage 1 through stage 4 chronic kidney disease, or unspecified chronic kidney disease: Secondary | ICD-10-CM | POA: Diagnosis present

## 2023-11-29 DIAGNOSIS — Z9981 Dependence on supplemental oxygen: Secondary | ICD-10-CM

## 2023-11-29 DIAGNOSIS — Z7901 Long term (current) use of anticoagulants: Secondary | ICD-10-CM

## 2023-11-29 DIAGNOSIS — I10 Essential (primary) hypertension: Secondary | ICD-10-CM | POA: Diagnosis not present

## 2023-11-29 DIAGNOSIS — Z7982 Long term (current) use of aspirin: Secondary | ICD-10-CM

## 2023-11-29 DIAGNOSIS — Z880 Allergy status to penicillin: Secondary | ICD-10-CM

## 2023-11-29 LAB — BASIC METABOLIC PANEL WITH GFR
Anion gap: 7 (ref 5–15)
BUN: 43 mg/dL — ABNORMAL HIGH (ref 8–23)
CO2: 22 mmol/L (ref 22–32)
Calcium: 8.5 mg/dL — ABNORMAL LOW (ref 8.9–10.3)
Chloride: 107 mmol/L (ref 98–111)
Creatinine, Ser: 3.02 mg/dL — ABNORMAL HIGH (ref 0.61–1.24)
GFR, Estimated: 20 mL/min — ABNORMAL LOW (ref >60)
Glucose, Bld: 296 mg/dL — ABNORMAL HIGH (ref 70–99)
Potassium: 4 mmol/L (ref 3.5–5.1)
Sodium: 136 mmol/L (ref 135–145)

## 2023-11-29 LAB — TROPONIN I (HIGH SENSITIVITY)
Troponin I (High Sensitivity): 24 ng/L — ABNORMAL HIGH (ref <18)
Troponin I (High Sensitivity): 27 ng/L — ABNORMAL HIGH (ref <18)

## 2023-11-29 LAB — URINALYSIS, ROUTINE W REFLEX MICROSCOPIC
Bilirubin Urine: NEGATIVE
Glucose, UA: >=500 mg/dL — AB
Ketones, ur: NEGATIVE mg/dL
Leukocytes,Ua: NEGATIVE
Nitrite: NEGATIVE
Protein, ur: >=300 mg/dL — AB
Specific Gravity, Urine: 1.014 (ref 1.005–1.030)
pH: 5 (ref 5.0–8.0)

## 2023-11-29 LAB — CBC
HCT: 32.1 % — ABNORMAL LOW (ref 39.0–52.0)
Hemoglobin: 10.1 g/dL — ABNORMAL LOW (ref 13.0–17.0)
MCH: 30.6 pg (ref 26.0–34.0)
MCHC: 31.5 g/dL (ref 30.0–36.0)
MCV: 97.3 fL (ref 80.0–100.0)
Platelets: 347 K/uL (ref 150–400)
RBC: 3.3 MIL/uL — ABNORMAL LOW (ref 4.22–5.81)
RDW: 14.6 % (ref 11.5–15.5)
WBC: 9.5 K/uL (ref 4.0–10.5)
nRBC: 0 % (ref 0.0–0.2)

## 2023-11-29 LAB — D-DIMER, QUANTITATIVE: D-Dimer, Quant: 2.88 ug{FEU}/mL — ABNORMAL HIGH (ref 0.00–0.50)

## 2023-11-29 LAB — RESP PANEL BY RT-PCR (RSV, FLU A&B, COVID)  RVPGX2
Influenza A by PCR: NEGATIVE
Influenza B by PCR: NEGATIVE
Resp Syncytial Virus by PCR: NEGATIVE
SARS Coronavirus 2 by RT PCR: NEGATIVE

## 2023-11-29 LAB — SODIUM, URINE, RANDOM: Sodium, Ur: 43 mmol/L

## 2023-11-29 LAB — BRAIN NATRIURETIC PEPTIDE: B Natriuretic Peptide: 164.4 pg/mL — ABNORMAL HIGH (ref 0.0–100.0)

## 2023-11-29 LAB — CREATININE, URINE, RANDOM: Creatinine, Urine: 73 mg/dL

## 2023-11-29 LAB — CBG MONITORING, ED: Glucose-Capillary: 344 mg/dL — ABNORMAL HIGH (ref 70–99)

## 2023-11-29 MED ORDER — SODIUM CHLORIDE 0.9% FLUSH: 3.0000 mL | INTRAVENOUS | Status: DC | PRN

## 2023-11-29 MED ORDER — SODIUM CHLORIDE 0.9% FLUSH
3.0000 mL | Freq: Two times a day (BID) | INTRAVENOUS | Status: DC
  Administered 2023-11-29 – 2023-12-02 (×6): 3 mL via INTRAVENOUS

## 2023-11-29 MED ORDER — INSULIN ASPART PROT & ASPART (70-30 MIX) 100 UNIT/ML ~~LOC~~ SUSP
10.0000 [IU] | Freq: Three times a day (TID) | SUBCUTANEOUS | Status: DC
  Administered 2023-11-30 – 2023-12-01 (×4): 10 [IU] via SUBCUTANEOUS
  Filled 2023-11-29: qty 10

## 2023-11-29 MED ORDER — ASPIRIN 81 MG PO TBEC
81.0000 mg | DELAYED_RELEASE_TABLET | Freq: Every day | ORAL | Status: DC
  Administered 2023-11-30 – 2023-12-02 (×3): 81 mg via ORAL
  Filled 2023-11-29 (×3): qty 1

## 2023-11-29 MED ORDER — BUDESON-GLYCOPYRROL-FORMOTEROL 160-9-4.8 MCG/ACT IN AERO: 2.0000 | INHALATION_SPRAY | Freq: Two times a day (BID) | RESPIRATORY_TRACT | Status: DC

## 2023-11-29 MED ORDER — LACTATED RINGERS IV BOLUS
1000.0000 mL | INTRAVENOUS | Status: AC
  Administered 2023-11-29: 1000 mL via INTRAVENOUS

## 2023-11-29 MED ORDER — SODIUM CHLORIDE 0.9 % IV SOLN
500.0000 mg | INTRAVENOUS | Status: DC
  Administered 2023-11-29 – 2023-11-30 (×2): 500 mg via INTRAVENOUS
  Filled 2023-11-29 (×2): qty 5

## 2023-11-29 MED ORDER — SODIUM CHLORIDE 0.9 % IV SOLN
250.0000 mL | INTRAVENOUS | Status: AC | PRN
Start: 2023-11-29 — End: 2023-11-30

## 2023-11-29 MED ORDER — IPRATROPIUM-ALBUTEROL 0.5-2.5 (3) MG/3ML IN SOLN
3.0000 mL | Freq: Four times a day (QID) | RESPIRATORY_TRACT | Status: DC
  Administered 2023-11-30 – 2023-12-02 (×10): 3 mL via RESPIRATORY_TRACT
  Filled 2023-11-29 (×12): qty 3

## 2023-11-29 MED ORDER — METHYLPREDNISOLONE SODIUM SUCC 40 MG IJ SOLR: 40.0000 mg | Freq: Every day | INTRAMUSCULAR | Status: DC

## 2023-11-29 MED ORDER — METHYLPREDNISOLONE SODIUM SUCC 40 MG IJ SOLR
40.0000 mg | INTRAMUSCULAR | Status: DC
  Administered 2023-11-30: 40 mg via INTRAVENOUS
  Filled 2023-11-29 (×3): qty 1

## 2023-11-29 MED ORDER — HEPARIN SODIUM (PORCINE) 5000 UNIT/ML IJ SOLN
5000.0000 [IU] | Freq: Three times a day (TID) | INTRAMUSCULAR | Status: DC
  Administered 2023-11-30: 5000 [IU] via SUBCUTANEOUS
  Filled 2023-11-29 (×2): qty 1

## 2023-11-29 MED ORDER — IPRATROPIUM-ALBUTEROL 0.5-2.5 (3) MG/3ML IN SOLN: 3.0000 mL | Freq: Four times a day (QID) | RESPIRATORY_TRACT | Status: DC | PRN

## 2023-11-29 MED ORDER — AMLODIPINE BESYLATE 5 MG PO TABS
10.0000 mg | ORAL_TABLET | Freq: Every day | ORAL | Status: DC
  Administered 2023-11-30 – 2023-12-02 (×3): 10 mg via ORAL
  Filled 2023-11-29 (×3): qty 2

## 2023-11-29 MED ORDER — ACETAMINOPHEN 325 MG PO TABS
650.0000 mg | ORAL_TABLET | Freq: Four times a day (QID) | ORAL | Status: DC | PRN
  Administered 2023-11-30: 650 mg via ORAL
  Filled 2023-11-29: qty 2

## 2023-11-29 MED ORDER — GUAIFENESIN-DM 100-10 MG/5ML PO SYRP
10.0000 mL | ORAL_SOLUTION | Freq: Four times a day (QID) | ORAL | Status: DC
  Administered 2023-11-29 – 2023-12-02 (×12): 10 mL via ORAL
  Filled 2023-11-29 (×12): qty 10

## 2023-11-29 MED ORDER — ONDANSETRON HCL 4 MG/2ML IJ SOLN
4.0000 mg | Freq: Four times a day (QID) | INTRAMUSCULAR | Status: DC | PRN
Start: 2023-11-29 — End: 2023-12-02

## 2023-11-29 MED ORDER — ONDANSETRON HCL 4 MG PO TABS: 4.0000 mg | ORAL_TABLET | Freq: Four times a day (QID) | ORAL | Status: DC | PRN

## 2023-11-29 MED ORDER — METHYLPREDNISOLONE SODIUM SUCC 125 MG IJ SOLR: 125.0000 mg | Freq: Every day | INTRAMUSCULAR | Status: DC

## 2023-11-29 MED ORDER — INSULIN ASPART 100 UNIT/ML IJ SOLN
0.0000 [IU] | Freq: Four times a day (QID) | INTRAMUSCULAR | Status: DC | PRN
  Administered 2023-11-29: 15 [IU] via SUBCUTANEOUS
  Administered 2023-11-30: 4 [IU] via SUBCUTANEOUS

## 2023-11-29 MED ORDER — INSULIN LISPRO PROT & LISPRO (75-25 MIX) 100 UNIT/ML KWIKPEN: 10.0000 [IU] | PEN_INJECTOR | Freq: Three times a day (TID) | SUBCUTANEOUS | Status: DC

## 2023-11-29 MED ORDER — ACETAMINOPHEN 650 MG RE SUPP: 650.0000 mg | Freq: Four times a day (QID) | RECTAL | Status: DC | PRN

## 2023-11-29 MED ORDER — FINASTERIDE 5 MG PO TABS
5.0000 mg | ORAL_TABLET | Freq: Every day | ORAL | Status: DC
  Administered 2023-11-30 – 2023-12-02 (×3): 5 mg via ORAL
  Filled 2023-11-29 (×4): qty 1

## 2023-11-29 MED ORDER — IPRATROPIUM-ALBUTEROL 0.5-2.5 (3) MG/3ML IN SOLN
3.0000 mL | Freq: Once | RESPIRATORY_TRACT | Status: AC
  Administered 2023-11-29: 3 mL via RESPIRATORY_TRACT
  Filled 2023-11-29: qty 3

## 2023-11-29 MED ORDER — ALBUTEROL SULFATE (2.5 MG/3ML) 0.083% IN NEBU
5.0000 mg | INHALATION_SOLUTION | Freq: Once | RESPIRATORY_TRACT | Status: AC
  Administered 2023-11-29: 5 mg via RESPIRATORY_TRACT
  Filled 2023-11-29: qty 6

## 2023-11-29 MED ORDER — MAGNESIUM SULFATE 2 GM/50ML IV SOLN
2.0000 g | INTRAVENOUS | Status: AC
  Administered 2023-11-29: 2 g via INTRAVENOUS
  Filled 2023-11-29: qty 50

## 2023-11-29 NOTE — ED Provider Notes (Signed)
 [place] EMERGENCY DEPARTMENT AT [facility] Provider Note   CSN: [id number] Arrival date & time: 11/29/23  1328     Patient presents with: Shortness of Breath   Jeffrey Randall. is a 83 y.o. male.    83 year old male with a past medical history of COPD on 1 L oxygen  nasal cannula at baseline presents to the ED with worsening shortness of breath for the past 3 days.  History obtained by his son at the bedside, who reports patient is having a harder time breathing anytime he does any activity.  He does smoke tobacco daily but quit 4 days ago because he felt so bad.  He reports he is unable to do any kind of moving around without getting very winded.  According to his son, he does not use his oxygen  at all times, he forgets about it.  Yesterday they increased his oxygen  from 1 L to 2 L nasal cannula with not much improvement in symptoms.  He has been out of his albuterol  inhaler for some time, does report doing his nebulizer treatments at home without any improvement in symptoms.  Patient also tells me he is coughing some white thick sputum, he has not been running any fevers.  He has not noticed any swelling to his legs.  No prior history of PEs, no fever, not anticoagulated.No chest pain.   The history is provided by the patient and a relative.  Shortness of Breath Severity:  Severe Onset quality:  Gradual Duration:  4 days Timing:  Constant Progression:  Worsening Chronicity:  Recurrent Context: activity   Relieved by:  Rest Worsened by:  Exertion and movement Ineffective treatments:  Inhaler Associated symptoms: cough   Associated symptoms: no fever and no hemoptysis        Prior to Admission medications   Medication Sig Start Date End Date Taking? Authorizing Provider  albuterol  (VENTOLIN  HFA) 108 (90 Base) MCG/ACT inhaler Inhale 1 puff into the lungs daily as needed for wheezing or shortness of breath. 09/01/21   [provider]  Albuterol  Sulfate 2.5  MG/0.5ML NEBU Inhale into the lungs.    [provider]  allopurinol (ZYLOPRIM) 100 MG tablet Take 100 mg by mouth every morning. 02/25/22   [provider]  amLODipine  (NORVASC ) 10 MG tablet Take 10 mg by mouth daily.    [provider]  aspirin  EC 81 MG tablet Take 1 tablet (81 mg total) by mouth daily. Swallow whole. 05/03/22   Krasowski, Robert J, MD  Continuous Glucose Sensor (FREESTYLE LIBRE 3 PLUS SENSOR) MISC Use as directed and change every 14 (fourteen) days. 02/15/23     Cyanocobalamin (VITAMIN B-12) 2500 MCG SUBL Place 2,500 mcg under the tongue daily.    [provider]  dapagliflozin propanediol (FARXIGA) 10 MG TABS tablet Take 1 tablet by mouth daily. Patient not taking: Reported on 08/03/2023 09/03/21   [provider]  finasteride  (PROSCAR ) 5 MG tablet Take 5 mg by mouth daily. 02/25/22   [provider]  Fluticasone -Umeclidin-Vilant (TRELEGY ELLIPTA ) 200-62.5-25 MCG/ACT AEPB Inhale 1 puff into the lungs daily. 05/05/23 05/04/24  Mannam, Praveen, MD  HUMALOG  MIX 75/25 KWIKPEN (75-25) 100 UNIT/ML KwikPen Inject into the skin. Tid 07/30/23   [provider]  ipratropium-albuterol  (DUONEB) 0.5-2.5 (3) MG/3ML SOLN Take 3 mLs by nebulization every 4 (four) hours as needed. Patient taking differently: Take 3 mLs by nebulization daily as needed (wheezing/sob). 05/05/23 05/04/24  Mannam, Praveen, MD  nicotine  (NICODERM CQ  -  DOSED IN MG/24 HOURS) 21 mg/24hr patch Place 1 patch (21 mg total) onto the skin daily. 07/08/23   Briana Elgin LABOR, MD  Omega-3 Fatty Acids (FISH OIL) 1200 MG CAPS Take 1,200 mg by mouth daily.    [provider]  simvastatin  (ZOCOR ) 10 MG tablet Take 10 mg by mouth at bedtime. 02/25/22   [provider]  tamsulosin  (FLOMAX ) 0.4 MG CAPS capsule Take 0.8 mg by mouth at bedtime. 02/25/22   [provider]  valsartan-hydrochlorothiazide  (DIOVAN-HCT) 320-25 MG tablet Take 1 tablet by mouth daily.  04/28/22   [provider]  Varenicline  Tartrate, Starter, 0.5 MG X 11 & 1 MG X 42 TBPK TAKE AS DIRECTED 09/29/23   Mannam, Praveen, MD    Allergies: Chantix  continuing month [varenicline ], Januvia [sitagliptin], Lisinopril, Motrin [ibuprofen], Remeron [mirtazapine], and Penicillins    Review of Systems  Constitutional:  Negative for fever.  Respiratory:  Positive for cough and shortness of breath. Negative for hemoptysis.   All other systems reviewed and are negative.   Updated Vital Signs BP (!) 165/62 (BP Location: Right Arm)   Pulse 74   Temp 97.9 F (36.6 C)   Resp (!) 22   Ht 5' 10 (1.778 m)   Wt 63.5 kg   SpO2 94%   BMI 20.09 kg/m   Physical Exam Vitals and nursing note reviewed.  Constitutional:      Appearance: He is well-developed.  HENT:     Head: Normocephalic and atraumatic.  Eyes:     General: No scleral icterus.    Pupils: Pupils are equal, round, and reactive to light.  Cardiovascular:     Rate and Rhythm: Normal rate.     Heart sounds: Normal heart sounds.  Pulmonary:     Effort: Pulmonary effort is normal.     Breath sounds: Examination of the right-upper field reveals decreased breath sounds. Examination of the right-middle field reveals decreased breath sounds. Decreased breath sounds present. No wheezing.  Chest:     Chest wall: No tenderness.  Abdominal:     General: Bowel sounds are normal. There is no distension.     Palpations: Abdomen is soft.     Tenderness: There is no abdominal tenderness.  Musculoskeletal:        General: No tenderness or deformity.     Cervical back: Normal range of motion.     Right lower leg: No edema.     Left lower leg: No edema.  Skin:    General: Skin is warm and dry.  Neurological:     Mental Status: He is alert and oriented to person, place, and time.     (all labs ordered are listed, but only abnormal results are displayed) Labs Reviewed  BASIC METABOLIC PANEL WITH GFR - Abnormal; Notable for  the following components:      Result Value   Glucose, Bld 296 (*)    BUN 43 (*)    Creatinine, Ser 3.02 (*)    Calcium 8.5 (*)    GFR, Estimated 20 (*)    All other components within normal limits  CBC - Abnormal; Notable for the following components:   RBC 3.30 (*)    Hemoglobin 10.1 (*)    HCT 32.1 (*)    All other components within normal limits  RESP PANEL BY RT-PCR (RSV, FLU A&B, COVID)  RVPGX2  BRAIN NATRIURETIC PEPTIDE  D-DIMER, QUANTITATIVE  COMPREHENSIVE METABOLIC PANEL WITH GFR  CBC  TROPONIN I (HIGH SENSITIVITY)  TROPONIN  I (HIGH SENSITIVITY)    EKG: None  Radiology: DG Chest 2 View Result Date: 11/29/2023 CLINICAL DATA:  Shortness of breath. EXAM: CHEST - 2 VIEW COMPARISON:  07/05/2023. FINDINGS: Stable cardiomediastinal contours. Aortic atherosclerosis. Chronic background emphysema and scarring. Chronic blunting of the left costophrenic angle. No focal consolidation, pleural effusion, or pneumothorax. No acute osseous abnormality. IMPRESSION: No acute cardiopulmonary findings. Electronically Signed   By: Harrietta Sherry M.D.   On: 11/29/2023 14:11     Procedures   Medications Ordered in the ED  insulin  aspart (novoLOG ) injection 0-20 Units (has no administration in time range)  Insulin  Lispro Prot & Lispro (HUMALOG  75/25 MIX) (75-25) 100 UNIT/ML KwikPen 10 Units (has no administration in time range)  methylPREDNISolone  sodium succinate (SOLU-MEDROL ) 125 mg/2 mL injection 125 mg (has no administration in time range)    Followed by  methylPREDNISolone  sodium succinate (SOLU-MEDROL ) 40 mg/mL injection 40 mg (has no administration in time range)  amLODipine  (NORVASC ) tablet 10 mg (has no administration in time range)  aspirin  EC tablet 81 mg (has no administration in time range)  finasteride  (PROSCAR ) tablet 5 mg (has no administration in time range)  ipratropium-albuterol  (DUONEB) 0.5-2.5 (3) MG/3ML nebulizer solution 3 mL (has no administration in time range)   ipratropium-albuterol  (DUONEB) 0.5-2.5 (3) MG/3ML nebulizer solution 3 mL (has no administration in time range)  azithromycin  (ZITHROMAX ) 500 mg in sodium chloride  0.9 % 250 mL IVPB (has no administration in time range)  heparin  injection 5,000 Units (has no administration in time range)  sodium chloride  flush (NS) 0.9 % injection 3 mL (has no administration in time range)  sodium chloride  flush (NS) 0.9 % injection 3 mL (has no administration in time range)  0.9 %  sodium chloride  infusion (has no administration in time range)  acetaminophen  (TYLENOL ) tablet 650 mg (has no administration in time range)    Or  acetaminophen  (TYLENOL ) suppository 650 mg (has no administration in time range)  ondansetron  (ZOFRAN ) tablet 4 mg (has no administration in time range)    Or  ondansetron  (ZOFRAN ) injection 4 mg (has no administration in time range)  magnesium  sulfate IVPB 2 g 50 mL (has no administration in time range)  guaiFENesin -dextromethorphan  (ROBITUSSIN DM) 100-10 MG/5ML syrup 10 mL (has no administration in time range)  lactated ringers  bolus 1,000 mL (has no administration in time range)  albuterol  (PROVENTIL ) (2.5 MG/3ML) 0.083% nebulizer solution 5 mg (5 mg Nebulization Given 11/29/23 1814)  ipratropium-albuterol  (DUONEB) 0.5-2.5 (3) MG/3ML nebulizer solution 3 mL (3 mLs Nebulization Given 11/29/23 1814)    Clinical Course as of 11/29/23 1956  Tue Nov 29, 2023  1948 Glucose(!): 296 [JS]  1950 Creatinine(!): 3.02 Worsen than prior [JS]    Clinical Course User Index [JS] Leotha Westermeyer, PA-C                                 Medical Decision Making Amount and/or Complexity of Data Reviewed Labs: ordered. Radiology: ordered.  Risk Prescription drug management. Decision regarding hospitalization.    This patient presents to the ED for concern of sob, this involves a number of treatment options, and is a complaint that carries with it a high risk of complications and morbidity.  The  differential diagnosis includes PE, COPD exacerbation versus infection.    Co morbidities: Discussed in HPI   Brief History:  See HPI.  EMR reviewed including pt PMHx, past surgical history and past visits  to ER.   See HPI for more details   Lab Tests:  I ordered and independently interpreted labs.  The pertinent results include:    CBC with no leukocytosis, hemoglobin slightly decreased. BMP is without electrolyte derangement.  Creatinine level is worsened than prior.  GFR has also worsened.  BNP along with troponin have been added but not collected.   Imaging Studies:  NAD. I personally reviewed all imaging studies and no acute abnormality found. I agree with radiology interpretation.  Cardiac Monitoring:  The patient was maintained on a cardiac monitor.  I personally viewed and interpreted the cardiac monitored which showed an underlying rhythm of: NSR EKG non-ischemic   Medicines ordered:  I ordered medication including albuterol , DuoNeb for work of breathing Reevaluation of the patient after these medicines showed that the patient improved I have reviewed the patients home medicines and have made adjustments as needed  Reevaluation:  After the interventions noted above I re-evaluated patient and found that they have :improved  Social Determinants of Health:  The patient's social determinants of health were a factor in the care of this patient  Problem List / ED Course:  Patient presented to the ED with a chief complaint of shortness of breath increasing work of breathing over the past several days.  Usually wears 1 L nasal cannula at baseline, has had an increase to 2 L since yesterday per son at the bedside.  Patient continues to have a thick productive cough has not had any fevers.  He does have prior history of lung cancer, reports being in remission at this time.  His chest x-ray is within normal limits.  Blood work today shows a BMP with slight elevation in  his creatinine from his baseline.  No electrolyte derangement.  CBC with no leukocytosis, hemoglobin is otherwise stable.  Added a BNP along with a troponin although he tells me he does not have any chest pain, I do have some suspicion for heart failure component. X-ray does not show any infectious process, no pneumonia.  Patient tells me that every time he does any sort of activity along with any type of movement he becomes very winded.  According to son he has not had much activity as he feels that he cannot catch his breath.  Strong suspicion for pulmonary embolism with underlying cancer pathology in hypoxia.  No tachycardia today, however does take medication for blood pressure control. Does not appear fluid overloaded to me on exam, but does appear uncomfortable.  I did not hear much wheezing however he does have decreased breath sounds throughout.  I did discuss with son and patient that patient would benefit from admission.  He was given a DuoNeb, along with inhaler which she had been trying at home without much improvement in his symptoms.  Oxygen  saturation has improved some but he does have an increase in oxygen  requirement.  I do feel that patient warrants admission at this time. Spoke to Dr. Sundil, who will admit patient for further management.   Dispostion:  After consideration of the diagnostic results and the patients response to treatment, I feel that the patent would benefit from admission for further management of COPD exacerbation along with increase in oxygen  requirement.   Portions of this note were generated with Scientist, clinical (histocompatibility and immunogenetics). Dictation errors may occur despite best attempts at proofreading.   Final diagnoses:  COPD exacerbation (HCC)  Shortness of breath    ED Discharge Orders     None  Aimar Shrewsbury, PA-C 11/29/23 1956    Albertina Dixon, MD 11/30/23 [phone]

## 2023-11-29 NOTE — ED Notes (Signed)
 Patient transported to X-ray

## 2023-11-29 NOTE — H&P (Signed)
 History and Physical    [name] FMW:[id number] DOB: Nov 19, 1940 DOA: 11/29/2023  PCP: Sheldon Netter, PA   Patient coming from: Home   Chief Complaint:  Chief Complaint  Patient presents with   Shortness of Breath   ED TRIAGE note:  Patient reports shortness of breath, worse than baseline x 4 days with productive cough. Denies fever/chills, extremity edema, and chest pain. No hypoxia noted.    HPI:  Jeffrey Randall. is a 83 y.o. male with medical history significant of chronic hypoxic respiratory failure, COPD, CKD 3B/4, essential hypertension, insulin -dependent DM type II, history of lung cancer status postradiation, gout, BPH, chronic normocytic anemia and hyperlipidemia presenting to emergency department complaining of shortness of breath for last 3 days.  Patient son at the bedside who provided the history primarily.  Per patient's son patient is having harder time breathing anytime he does any activity.  He also smoke tobacco daily but stopped smoking 4 days ago because he is not feeling well.  Reported unable to get any kind of movement without getting short winded.  According to patient's son he does not use his oxygen  all the time as he forgets it.  Yesterday at home home oxygen  increased to 1 L to 2 L without any improvement of shortness of breath.  Patient also used albuterol  inhaler and nebulizer treatment without any relief.  He is also having productive cough with white mucoid sputum.  Denies any fever, chills, abdominal pain nausea, vomiting diarrhea.  Denies any bilateral lower extremity swelling, chest pain or palpitation.   ED Course:  At presentation to ED patient is hemodynamically stable Eksir blood pressure borderline elevated O2 sat 94-96% on room air Pending BMP, troponin.   Pending respiratory panel. CBC unremarkable normal WBC count, stable H&H normal platelet count. Chest x-ray showing no acute disease process. BMP showing elevated blood glucose  296, elevated creatinine 3.02 and elevated BUN 43. EKG showed normal sinus rhythm with short PR interval heart rate 67 In the ED patient received DuoNeb nebulizer, albuterol  nebulizer.   Significant labs in the ED: Lab Orders         Resp panel by RT-PCR (RSV, Flu A&B, Covid) Anterior Nasal Swab         Basic metabolic panel         CBC         Brain natriuretic peptide         D-dimer, quantitative         Comprehensive metabolic panel         CBC         Creatinine, urine, random         Urinalysis, Routine w reflex microscopic -Urine, Clean Catch         Sodium, urine, random         CBG monitoring, ED       Review of Systems:  Review of Systems  Constitutional:  Positive for malaise/fatigue. Negative for chills, fever and weight loss.  Respiratory:  Positive for cough, sputum production, shortness of breath and wheezing. Negative for hemoptysis.   Cardiovascular:  Negative for chest pain, palpitations and leg swelling.  Gastrointestinal:  Negative for abdominal pain, diarrhea, heartburn, nausea and vomiting.  Genitourinary:  Negative for dysuria, frequency and urgency.  Musculoskeletal:  Negative for myalgias.  Neurological:  Negative for dizziness and headaches.  Psychiatric/Behavioral:  The patient is not nervous/anxious.     Past Medical History:  Diagnosis Date  Alcoholism in recovery [facility])    Aortic atherosclerosis (HCC)    Back pain 06/17/2014   Chronic lung disease    CKD (chronic kidney disease)    COPD (chronic obstructive pulmonary disease) (HCC) 07/23/2014   Essential hypertension    Gout    History of lung cancer    History of pancreatitis    Iron deficiency anemia    Lumbar disc disease    Mixed hyperlipidemia    Notalgia    Prostate hypertrophy    Pulmonary nodules 07/23/2014   Swelling    Tobacco use disorder 07/23/2014   Tumor of lung 01/26/2021   Type 2 diabetes mellitus without complication (HCC)    Uncontrolled type 2 diabetes mellitus  with hyperglycemia, without long-term current use of insulin  [facility])     Past Surgical History:  Procedure Laterality Date   BACK SURGERY       reports that he has been smoking cigarettes. He started smoking about 62 years ago. He has a 90 pack-year smoking history. He has never used smokeless tobacco. He reports that he does not currently use alcohol after a past usage of about 3.0 standard drinks of alcohol per week. He reports that he does not use drugs.  Allergies  Allergen Reactions   Chantix  Continuing Month [Varenicline ] Other (See Comments)    Doesn't work for patient   Januvia [Sitagliptin] Other (See Comments)    Class Contraindicated   Lisinopril Swelling    Possible tongue swelling. ARB's okay   Motrin [Ibuprofen] Other (See Comments)    Contraindicated by Renal disease   Remeron [Mirtazapine] Other (See Comments)    Excess somnolence on the 15 mg dose   Penicillins Other (See Comments)    Facial swelling    Family History  Problem Relation Age of Onset   Lung cancer Mother    Lung cancer Father     Prior to Admission medications   Medication Sig Start Date End Date Taking? Authorizing Provider  albuterol  (VENTOLIN  HFA) 108 (90 Base) MCG/ACT inhaler Inhale 1 puff into the lungs daily as needed for wheezing or shortness of breath. 09/01/21   [provider]  Albuterol  Sulfate 2.5 MG/0.5ML NEBU Inhale into the lungs.    [provider]  allopurinol (ZYLOPRIM) 100 MG tablet Take 100 mg by mouth every morning. 02/25/22   [provider]  amLODipine  (NORVASC ) 10 MG tablet Take 10 mg by mouth daily.    [provider]  aspirin  EC 81 MG tablet Take 1 tablet (81 mg total) by mouth daily. Swallow whole. 05/03/22   Krasowski, Robert J, MD  Continuous Glucose Sensor (FREESTYLE LIBRE 3 PLUS SENSOR) MISC Use as directed and change every 14 (fourteen) days. 02/15/23     Cyanocobalamin (VITAMIN B-12) 2500 MCG SUBL Place 2,500 mcg under the tongue  daily.    [provider]  dapagliflozin propanediol (FARXIGA) 10 MG TABS tablet Take 1 tablet by mouth daily. Patient not taking: Reported on 08/03/2023 09/03/21   [provider]  finasteride  (PROSCAR ) 5 MG tablet Take 5 mg by mouth daily. 02/25/22   [provider]  Fluticasone -Umeclidin-Vilant (TRELEGY ELLIPTA ) 200-62.5-25 MCG/ACT AEPB Inhale 1 puff into the lungs daily. 05/05/23 05/04/24  Mannam, Praveen, MD  HUMALOG  MIX 75/25 KWIKPEN (75-25) 100 UNIT/ML KwikPen Inject into the skin. Tid 07/30/23   [provider]  ipratropium-albuterol  (DUONEB) 0.5-2.5 (3) MG/3ML SOLN Take 3 mLs by nebulization every 4 (four) hours as needed. Patient taking differently: Take 3 mLs by  nebulization daily as needed (wheezing/sob). 05/05/23 05/04/24  Mannam, Praveen, MD  nicotine  (NICODERM CQ  - DOSED IN MG/24 HOURS) 21 mg/24hr patch Place 1 patch (21 mg total) onto the skin daily. 07/08/23   Briana Elgin LABOR, MD  Omega-3 Fatty Acids (FISH OIL) 1200 MG CAPS Take 1,200 mg by mouth daily.    [provider]  simvastatin  (ZOCOR ) 10 MG tablet Take 10 mg by mouth at bedtime. 02/25/22   [provider]  tamsulosin  (FLOMAX ) 0.4 MG CAPS capsule Take 0.8 mg by mouth at bedtime. 02/25/22   [provider]  valsartan-hydrochlorothiazide  (DIOVAN-HCT) 320-25 MG tablet Take 1 tablet by mouth daily. 04/28/22   [provider]  Varenicline  Tartrate, Starter, 0.5 MG X 11 & 1 MG X 42 TBPK TAKE AS DIRECTED 09/29/23   Mannam, Praveen, MD     Physical Exam: Vitals:   11/29/23 1334 11/29/23 1344 11/29/23 1716  BP: (!) 157/63  (!) 165/62  Pulse: 70  74  Resp: 20  (!) 22  Temp: 98 F (36.7 C)  97.9 F (36.6 C)  SpO2: 96%  94%  Weight:  63.5 kg   Height:  5' 10 (1.778 m)     Physical Exam Vitals and nursing note reviewed.  Constitutional:      General: He is not in acute distress.    Appearance: He is not ill-appearing.  Cardiovascular:     Rate and Rhythm:  Normal rate and regular rhythm.  Pulmonary:     Effort: Pulmonary effort is normal.     Breath sounds: Wheezing present. No decreased breath sounds, rhonchi or rales.  Musculoskeletal:     Right lower leg: No edema.     Left lower leg: No edema.  Skin:    Capillary Refill: Capillary refill takes less than 2 seconds.     Coloration: Skin is not cyanotic or pale.  Neurological:     Mental Status: He is alert and oriented to person, place, and time.  Psychiatric:        Mood and Affect: Mood normal.      Labs on Admission: I have personally reviewed following labs and imaging studies  CBC: Recent Labs  Lab 11/29/23 1355  WBC 9.5  HGB 10.1*  HCT 32.1*  MCV 97.3  PLT 347   Basic Metabolic Panel: Recent Labs  Lab 11/29/23 1355  NA 136  K 4.0  CL 107  CO2 22  GLUCOSE 296*  BUN 43*  CREATININE 3.02*  CALCIUM 8.5*   GFR: Estimated Creatinine Clearance: 16.4 mL/min (A) (by C-G formula based on SCr of 3.02 mg/dL (H)). Liver Function Tests: No results for input(s): AST, ALT, ALKPHOS, BILITOT, PROT, ALBUMIN  in the last 168 hours. No results for input(s): LIPASE, AMYLASE in the last 168 hours. No results for input(s): AMMONIA in the last 168 hours. Coagulation Profile: No results for input(s): INR, PROTIME in the last 168 hours. Cardiac Enzymes: No results for input(s): CKTOTAL, CKMB, CKMBINDEX, TROPONINI, TROPONINIHS in the last 168 hours. BNP (last 3 results) Recent Labs    06/28/23 1335 07/02/23 0259  BNP 116.4* 190.9*   HbA1C: No results for input(s): HGBA1C in the last 72 hours. CBG: Recent Labs  Lab 11/29/23 1954  GLUCAP 344*   Lipid Profile: No results for input(s): CHOL, HDL, LDLCALC, TRIG, CHOLHDL, LDLDIRECT in the last 72 hours. Thyroid  Function Tests: No results for input(s): TSH, T4TOTAL, FREET4, T3FREE, THYROIDAB in the last 72 hours. Anemia Panel: No results for input(s): VITAMINB12,  FOLATE, FERRITIN, TIBC, IRON, RETICCTPCT in the last 72 hours. Urine analysis:    Component Value Date/Time   COLORURINE YELLOW 06/17/2014 1257   APPEARANCEUR CLEAR 06/17/2014 1257   LABSPEC 1.020 06/17/2014 1257   PHURINE 6.5 06/17/2014 1257   GLUCOSEU 250 (A) 06/17/2014 1257   HGBUR NEGATIVE 06/17/2014 1257   BILIRUBINUR NEGATIVE 06/17/2014 1257   KETONESUR NEGATIVE 06/17/2014 1257   PROTEINUR 30 (A) 06/17/2014 1257   UROBILINOGEN 0.2 06/17/2014 1257   NITRITE NEGATIVE 06/17/2014 1257   LEUKOCYTESUR NEGATIVE 06/17/2014 1257    Radiological Exams on Admission: I have personally reviewed images DG Chest 2 View Result Date: 11/29/2023 CLINICAL DATA:  Shortness of breath. EXAM: CHEST - 2 VIEW COMPARISON:  07/05/2023. FINDINGS: Stable cardiomediastinal contours. Aortic atherosclerosis. Chronic background emphysema and scarring. Chronic blunting of the left costophrenic angle. No focal consolidation, pleural effusion, or pneumothorax. No acute osseous abnormality. IMPRESSION: No acute cardiopulmonary findings. Electronically Signed   By: Harrietta Sherry M.D.   On: 11/29/2023 14:11     EKG: My personal interpretation of EKG shows: Normal sinus rhythm heart rate 67    Assessment/Plan: Principal Problem:   COPD with acute exacerbation (HCC) Active Problems:   Acute kidney injury superimposed on chronic kidney disease (HCC)   Essential hypertension   Insulin  dependent type 2 diabetes mellitus (HCC)   Hyperlipidemia   History of lung cancer   Continuous dependence on cigarette smoking   History of BPH   Chronic anemia   Chronic hypoxic respiratory failure (HCC)    Assessment and Plan: Acute COPD exacerbation Chronic hypoxic respiratory failure on 2 L oxygen  at baseline-noncompliance Patient continued to smoking cigarettes History of lung cancer status postradiation -Patient present emergency department complaining of shortness of breath that is progressively getting  worse with last 4 days, unable to smoke cigarette because of shortness of breath and cough and not feeling well, noncompliance with home oxygen  supposed to be wearing 1 to 2 L, and overall not feeling well.  Patient denies any fever, chills, nausea, vomiting diarrhea.  Denies any chest pain, palpitation or lower extremity swelling. -At presentation to ED patient is hemodynamically stable however continues to have wheezing, cough. -CBC no evidence of leukocytosis.  Patient is afebrile.  Chest x-ray unremarkable.  Pending respiratory panel.  Given patient has previously elevated D-dimer in the ED troponin, D-dimer and BNP has been ordered.  Also getting VQ scan to rule out underlying PE. - CMP showed elevated blood glucose 296 which is why patient has not given IV steroids in the ED. - Restarting patient's home insulin  regimen also high sliding scale insulin  so that I can start treatment with IV steroid. - Due to elevated blood glucose and instead of giving IV Solu-Medrol  125 mg will due to IV 40 mg daily for 4 days course,  continue DuoNeb every 6 hours scheduled and as needed, continue supplemental oxygen  as needed and IV azithromycin  500 mg.  Need to follow-up with respiratory panel. - Continue supportive care.    Hyperglycemia Insulin -dependent DM type II -On BMP showing elevated blood glucose 296.  Normal bicarb and anion gap level.  Giving 1 L of LR bolus, initiating home NovoLog  70/25 mix 10 mg 3 times daily with meal and starting high sliding scale SSI every 6 hour as needed coverage.  Patient will be IV steroid which will probably further increase blood glucose level need to strictly manage blood glucose level goal between 140-180.   Acute kidney injury on CKD stage IIIb/IV -  Elevated creatinine 3.02.  Baseline creatinine around 2.6-2.7.  Pre-renal acute kidney injury in the setting of acute COPD exacerbation. -Continue managing for COPD as mentioned above.  Culture monitor renal  function.  History of previously elevated D-dimer - Given patient is known for removal at the baseline and previous evaluated D-dimer rechecking D-dimer, troponin and BNP.  And obtaining VQ scan to rule out underlying any PE.  Advised CKD and COPD limiting the use of CTA chest PE study.  History of BPH -Continue Proscar   Chronic anemia -H&H continue to monitor  Essential hypertension -Continue amlodipine    DVT prophylaxis:  SQ Heparin  Code Status:  Full Code Diet: Heart healthy carb modified Family Communication:   Family was present at bedside, at the time of interview. Opportunity was given to ask question and all questions were answered satisfactorily.  Disposition Plan: Continue monitor improvement of super exacerbation, need to follow-up with VQ scan and blood glucose level. Consults: None indicated at this time Admission status:   Observation, Telemetry bed  Severity of Illness: The appropriate patient status for this patient is OBSERVATION. Observation status is judged to be reasonable and necessary in order to provide the required intensity of service to ensure the patient's safety. The patient's presenting symptoms, physical exam findings, and initial radiographic and laboratory data in the context of their medical condition is felt to place them at decreased risk for further clinical deterioration. Furthermore, it is anticipated that the patient will be medically stable for discharge from the hospital within 2 midnights of admission.     Tashonna Descoteaux, MD [facility]  How to contact the [facility] Attending or Consulting provider 7A - 7P or covering provider during after hours 7P -7A, for this patient.  Check the care team in [facility] and look for a) attending/consulting [facility] provider listed and b) the TR[facility] team listed Log into [email or web address] and use [facility] universal password to access. If you do not have the password, please contact the hospital operator. Locate the TR[facility]  provider you are looking for under [facility] and page to a number that you can be directly reached. If you still have difficulty reaching the provider, please page the [facility] (Director on Call) for the Hospitalists listed on amion for assistance.  11/29/2023, 8:07 PM

## 2023-11-29 NOTE — ED Notes (Signed)
Pt provided with urinal at bedside

## 2023-11-29 NOTE — ED Notes (Signed)
 Holding steroid until dose of insulin  given, currently waiting to be verified and sent form pharmacy.

## 2023-11-29 NOTE — Progress Notes (Signed)
 The patient is admitted from ED to 5 N 26. A & O x 4 with some forgetfulness. He denies any acute pain. The patient Is oriented to his room,call bell/ascom and staff. Full assessment to epic completed. Will continue to monitor.

## 2023-11-29 NOTE — ED Notes (Signed)
 Pt very winded. Vitals taken and taken strait to triage. Sats noted to be in upper 90s

## 2023-11-29 NOTE — ED Notes (Signed)
 1st trop added on to bmp from triage labs.

## 2023-11-29 NOTE — ED Triage Notes (Signed)
 Patient reports shortness of breath, worse than baseline x 4 days with productive cough. Denies fever/chills, extremity edema, and chest pain. No hypoxia noted.

## 2023-11-30 ENCOUNTER — Observation Stay (HOSPITAL_COMMUNITY)

## 2023-11-30 ENCOUNTER — Observation Stay (HOSPITAL_BASED_OUTPATIENT_CLINIC_OR_DEPARTMENT_OTHER)

## 2023-11-30 DIAGNOSIS — Z86711 Personal history of pulmonary embolism: Secondary | ICD-10-CM

## 2023-11-30 DIAGNOSIS — R0602 Shortness of breath: Secondary | ICD-10-CM

## 2023-11-30 DIAGNOSIS — R918 Other nonspecific abnormal finding of lung field: Secondary | ICD-10-CM | POA: Diagnosis not present

## 2023-11-30 DIAGNOSIS — J441 Chronic obstructive pulmonary disease with (acute) exacerbation: Secondary | ICD-10-CM | POA: Diagnosis not present

## 2023-11-30 LAB — VITAMIN B12: Vitamin B-12: 3203 pg/mL — ABNORMAL HIGH (ref 180–914)

## 2023-11-30 LAB — CBC
HCT: 26.5 % — ABNORMAL LOW (ref 39.0–52.0)
Hemoglobin: 8.7 g/dL — ABNORMAL LOW (ref 13.0–17.0)
MCH: 31.6 pg (ref 26.0–34.0)
MCHC: 32.8 g/dL (ref 30.0–36.0)
MCV: 96.4 fL (ref 80.0–100.0)
Platelets: 321 K/uL (ref 150–400)
RBC: 2.75 MIL/uL — ABNORMAL LOW (ref 4.22–5.81)
RDW: 14.4 % (ref 11.5–15.5)
WBC: 9.1 K/uL (ref 4.0–10.5)
nRBC: 0 % (ref 0.0–0.2)

## 2023-11-30 LAB — COMPREHENSIVE METABOLIC PANEL WITH GFR
ALT: 15 U/L (ref 0–44)
AST: 12 U/L — ABNORMAL LOW (ref 15–41)
Albumin: 1.9 g/dL — ABNORMAL LOW (ref 3.5–5.0)
Alkaline Phosphatase: 118 U/L (ref 38–126)
Anion gap: 11 (ref 5–15)
BUN: 43 mg/dL — ABNORMAL HIGH (ref 8–23)
CO2: 19 mmol/L — ABNORMAL LOW (ref 22–32)
Calcium: 8.6 mg/dL — ABNORMAL LOW (ref 8.9–10.3)
Chloride: 113 mmol/L — ABNORMAL HIGH (ref 98–111)
Creatinine, Ser: 2.88 mg/dL — ABNORMAL HIGH (ref 0.61–1.24)
GFR, Estimated: 21 mL/min — ABNORMAL LOW (ref >60)
Glucose, Bld: 158 mg/dL — ABNORMAL HIGH (ref 70–99)
Potassium: 3.7 mmol/L (ref 3.5–5.1)
Sodium: 143 mmol/L (ref 135–145)
Total Bilirubin: 0.7 mg/dL (ref 0.0–1.2)
Total Protein: 5.2 g/dL — ABNORMAL LOW (ref 6.5–8.1)

## 2023-11-30 LAB — HEPARIN LEVEL (UNFRACTIONATED): Heparin Unfractionated: <0.1 [IU]/mL — ABNORMAL LOW (ref 0.30–0.70)

## 2023-11-30 LAB — IRON AND TIBC
Iron: 15 ug/dL — ABNORMAL LOW (ref 45–182)
Saturation Ratios: 7 % — ABNORMAL LOW (ref 17.9–39.5)
TIBC: 223 ug/dL — ABNORMAL LOW (ref 250–450)
UIBC: 208 ug/dL

## 2023-11-30 LAB — GLUCOSE, CAPILLARY
Glucose-Capillary: 128 mg/dL — ABNORMAL HIGH (ref 70–99)
Glucose-Capillary: 157 mg/dL — ABNORMAL HIGH (ref 70–99)
Glucose-Capillary: 214 mg/dL — ABNORMAL HIGH (ref 70–99)
Glucose-Capillary: 262 mg/dL — ABNORMAL HIGH (ref 70–99)
Glucose-Capillary: 88 mg/dL (ref 70–99)

## 2023-11-30 LAB — FERRITIN: Ferritin: 243 ng/mL (ref 24–336)

## 2023-11-30 LAB — FOLATE: Folate: 17.5 ng/mL (ref >5.9)

## 2023-11-30 LAB — HEMOGLOBIN AND HEMATOCRIT, BLOOD
HCT: 30.3 % — ABNORMAL LOW (ref 39.0–52.0)
Hemoglobin: 9.8 g/dL — ABNORMAL LOW (ref 13.0–17.0)

## 2023-11-30 MED ORDER — HEPARIN BOLUS VIA INFUSION
3500.0000 [IU] | Freq: Once | INTRAVENOUS | Status: AC
  Administered 2023-11-30: 3500 [IU] via INTRAVENOUS

## 2023-11-30 MED ORDER — TECHNETIUM TO 99M ALBUMIN AGGREGATED
4.4000 | Freq: Once | INTRAVENOUS | Status: AC | PRN
  Administered 2023-11-30: 4.4 via INTRAVENOUS

## 2023-11-30 MED ORDER — INSULIN ASPART 100 UNIT/ML IJ SOLN: 0.0000 [IU] | Freq: Every day | INTRAMUSCULAR | Status: DC

## 2023-11-30 MED ORDER — ALBUTEROL SULFATE (2.5 MG/3ML) 0.083% IN NEBU: 2.5000 mg | INHALATION_SOLUTION | RESPIRATORY_TRACT | Status: DC | PRN

## 2023-11-30 MED ORDER — ENSURE PLUS HIGH PROTEIN PO LIQD
237.0000 mL | Freq: Two times a day (BID) | ORAL | Status: DC
  Administered 2023-11-30 – 2023-12-01 (×3): 237 mL via ORAL

## 2023-11-30 MED ORDER — INSULIN ASPART 100 UNIT/ML IJ SOLN
0.0000 [IU] | Freq: Three times a day (TID) | INTRAMUSCULAR | Status: DC
  Administered 2023-11-30: 7 [IU] via SUBCUTANEOUS
  Administered 2023-11-30: 11 [IU] via SUBCUTANEOUS
  Administered 2023-12-01: 20 [IU] via SUBCUTANEOUS
  Administered 2023-12-01: 11 [IU] via SUBCUTANEOUS

## 2023-11-30 MED ORDER — HEPARIN (PORCINE) 25000 UT/250ML-% IV SOLN
1250.0000 [IU]/h | INTRAVENOUS | Status: DC
  Administered 2023-11-30: 950 [IU]/h via INTRAVENOUS
  Administered 2023-12-01: 1250 [IU]/h via INTRAVENOUS
  Filled 2023-11-30 (×2): qty 250

## 2023-11-30 NOTE — Plan of Care (Signed)
   Problem: Coping: Goal: Ability to adjust to condition or change in health will improve Outcome: Progressing   Problem: Fluid Volume: Goal: Ability to maintain a balanced intake and output will improve Outcome: Progressing

## 2023-11-30 NOTE — Progress Notes (Signed)
  Echocardiogram 2D Echocardiogram has been performed.  Jeffrey Randall 11/30/2023, 5:34 PM

## 2023-11-30 NOTE — Inpatient Diabetes Management (Incomplete)
 Inpatient Diabetes Program Recommendations  AACE/ADA: New Consensus Statement on Inpatient Glycemic Control (2015)  Target Ranges:  Prepandial:   less than 140 mg/dL      Peak postprandial:   less than 180 mg/dL (1-2 hours)      Critically ill patients:  140 - 180 mg/dL   Lab Results  Component Value Date   GLUCAP 214 (H) 11/30/2023   HGBA1C 9.9 (H) 06/29/2023    Review of Glycemic Control  Latest Reference Range & Units 11/29/23 19:54 11/30/23 02:09 11/30/23 06:42 11/30/23 11:12  Glucose-Capillary 70 - 99 mg/dL 655 (H) 871 (H) 842 (H) 214 (H)  (H): Data is abnormally high Diabetes history: Type 2 DM Outpatient Diabetes medications: Tresiba  10 units every day, N Current orders for Inpatient glycemic control: ***  Inpatient Diabetes Program Recommendations:    Consider adding Novolog  3 units TID (

## 2023-11-30 NOTE — Progress Notes (Signed)
 PHARMACY - ANTICOAGULATION CONSULT NOTE  Pharmacy Consult for heparin  Indication: pulmonary embolus  Allergies  Allergen Reactions   Chantix  Continuing Month [Varenicline ] Other (See Comments)    Doesn't work for patient   Januvia [Sitagliptin] Other (See Comments)    Class Contraindicated   Lisinopril Swelling    Possible tongue swelling. ARB's okay   Motrin [Ibuprofen] Other (See Comments)    Contraindicated by Renal disease   Remeron [Mirtazapine] Other (See Comments)    Excess somnolence on the 15 mg dose   Penicillins Other (See Comments)    Facial swelling    Patient Measurements: Height: 5' 10 (177.8 cm) Weight: 63.5 kg (140 lb) IBW/kg (Calculated) : 73 HEPARIN  DW (KG): 63.5  Vital Signs: Temp: 99 F (37.2 C) (08/20 1422) Temp Source: Oral (08/20 0432) BP: 141/44 (08/20 1422) Pulse Rate: 64 (08/20 1422)  Labs: Recent Labs    11/29/23 1355 11/29/23 1855 11/29/23 2232 11/30/23 0422  HGB 10.1*  --   --  8.7*  HCT 32.1*  --   --  26.5*  PLT 347  --   --  321  CREATININE 3.02*  --   --  2.88*  TROPONINIHS  --  24* 27*  --     Estimated Creatinine Clearance: 17.1 mL/min (A) (by C-G formula based on SCr of 2.88 mg/dL (H)).   Assessment: 83 y.o. male with medical history significant of history of lung cancer s/p radiation, chronic normocytic anemia not on anticoagulation PTA who presented to the emergency department complaining of shortness of breath. VQ perfusion scan highly concerning for pulmonary embolism. Significant decreased and heterogeneous perfusion of the entire left lung and right upper lobe with multiple defects. CBC stable, platelets 321.   Goal of Therapy:  Heparin  level 0.3-0.7 units/ml Monitor platelets by anticoagulation protocol: Yes   Plan:   IV heparin  3500 units x1 Start heparin  infusion at 950 units/hr Check heparin  level in 8 hours and daily while on heparin  Continue to monitor H&H and platelets  Thank you for allowing pharmacy  to be a part of this patient's care.  Shelba Collier, PharmD, BCPS Clinical Pharmacist

## 2023-11-30 NOTE — Evaluation (Signed)
 Clinical/Bedside Swallow Evaluation Patient Details  Name: Ahkeem Goede. 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)    Aortic atherosclerosis (HCC)    Back pain 06/17/2014   Chronic lung disease    CKD (chronic kidney disease)    COPD (chronic obstructive pulmonary disease) (HCC) 07/23/2014   Essential hypertension    Gout    History of lung cancer    History of pancreatitis    Iron deficiency anemia    Lumbar disc disease    Mixed hyperlipidemia    Notalgia    Prostate hypertrophy    Pulmonary nodules 07/23/2014   Swelling    Tobacco use disorder 07/23/2014   Tumor of lung 01/26/2021   Type 2 diabetes mellitus without complication (HCC)    Uncontrolled type 2 diabetes mellitus with hyperglycemia, without long-term current use of insulin  (HCC)    Past Surgical History:  Past Surgical History:  Procedure Laterality Date   BACK SURGERY     HPI:  Kc Summerson. is a 83 y.o. male with medical history significant of chronic hypoxic respiratory failure, COPD, CKD 3B/4, essential hypertension, insulin -dependent DM type II, history of lung cancer status postradiation, gout, BPH, chronic normocytic anemia and hyperlipidemia presenting to emergency department complaining of shortness of breath for last 3 days, continuing to smoke. Diagnosed with COPD exacerbation. CXR no acute cardiopulmonary findings.    Assessment / Plan / Recommendation  Clinical Impression  Pt is not suspected to have oropharyngeal dysphagia. Pt denies difficulty swallowing and states he has been coughing for 4 days when not eating. RN confirmed frequent coughing today without po's. He has dentures but declined to donn for eval and stated he could chew graham cracker  without them. No oromotor deficits and strong volitional cough. SLP witnessed pt coughing prior to consuming food/liquid. Multiple sips liquid via straw and bites applesauce did not result in s/s aspiration. He had one cough following cracker however suspect related to his baseline cough. He did not exhibit increased work of breathing during evaluation. SLP educated re: increased risk of silent aspiration in pt's with COPD however he does not have pneumonia currently or prior pna's.  Educated pt to take breaks during meals if he feels SOB. If he were to have recurrent pneumonia's in the future he may benefit from instrumental testing and pt educated. He is cleared to continue regular texture, thin liquids, pills with thin, rest breaks if needed. No further ST warranted. SLP Visit Diagnosis: Dysphagia, unspecified (R13.10)    Aspiration Risk  Mild aspiration risk    Diet Recommendation Regular;Thin liquid    Liquid Administration via: Cup;Straw Medication Administration: Whole meds with liquid Supervision: Patient able to self feed Compensations: Slow rate;Small sips/bites;Other (Comment) (rest breaks if needed) Postural Changes: Seated upright at 90 degrees    Other  Recommendations Oral Care Recommendations: Oral care BID     Assistance Recommended at Discharge    Functional Status Assessment Patient has not had a recent decline in their functional status  Frequency and Duration            Prognosis        Swallow Study   General Date of Onset: 11/30/23 HPI: Danny Yackley. is a 83 y.o. male  with medical history significant of chronic hypoxic respiratory failure, COPD, CKD 3B/4, essential hypertension, insulin -dependent DM type II, history of lung cancer status postradiation, gout, BPH, chronic normocytic anemia and hyperlipidemia presenting to emergency department complaining of shortness of breath for last 3 days, continuing to smoke. Diagnosed with COPD exacerbation. CXR no  acute cardiopulmonary findings. Type of Study: Bedside Swallow Evaluation Previous Swallow Assessment:  (none) Diet Prior to this Study: Regular;Thin liquids (Level 0) Temperature Spikes Noted: No Respiratory Status: Nasal cannula History of Recent Intubation: No Behavior/Cognition: Alert;Cooperative;Pleasant mood Oral Cavity Assessment: Within Functional Limits Oral Care Completed by SLP: No Oral Cavity - Dentition: Dentures, top;Dentures, bottom (did not want to don for assessment) Vision: Functional for self-feeding Self-Feeding Abilities: Able to feed self Patient Positioning: Upright in bed Baseline Vocal Quality: Normal Volitional Cough: Strong    Oral/Motor/Sensory Function Overall Oral Motor/Sensory Function: Within functional limits   Ice Chips Ice chips: Not tested   Thin Liquid Thin Liquid: Within functional limits Presentation: Straw    Nectar Thick Nectar Thick Liquid: Not tested   Honey Thick Honey Thick Liquid: Not tested   Puree Puree: Within functional limits   Solid     Solid:  (cough x 1- see impression statement)      Dustin Olam Bull 11/30/2023,2:23 PM

## 2023-11-30 NOTE — Plan of Care (Signed)
  Problem: Health Behavior/Discharge Planning: Goal: Ability to manage health-related needs will improve Outcome: Progressing   Problem: Education: Goal: Knowledge of General Education information will improve Description: Including pain rating scale, medication(s)/side effects and non-pharmacologic comfort measures Outcome: Progressing   Problem: Clinical Measurements: Goal: Cardiovascular complication will be avoided Outcome: Progressing   Problem: Pain Managment: Goal: General experience of comfort will improve and/or be controlled Outcome: Progressing   Problem: Clinical Measurements: Goal: Respiratory complications will improve Outcome: Not Progressing

## 2023-11-30 NOTE — Progress Notes (Signed)
 PROGRESS NOTE    Sim JULIANNA Rolan Randall.  FMW:[id number] DOB: 1940/11/30 DOA: 11/29/2023 PCP: Jeffrey Netter, PA  Chief Complaint  Patient presents with   Shortness of Breath    Brief Narrative:   Jeffrey Randall. is Jeffrey Randall 83 y.o. male with medical history significant of chronic hypoxic respiratory failure, COPD, CKD 3B/4, essential hypertension, insulin -dependent DM type II, history of lung cancer status postradiation, gout, BPH, chronic normocytic anemia and hyperlipidemia presenting to emergency department complaining of shortness of breath for last 3 days.  Patient son at the bedside who provided the history primarily.  Per patient's son patient is having harder time breathing anytime he does any activity.  He also smoke tobacco daily but stopped smoking 4 days ago because he is not feeling well.  Reported unable to get any kind of movement without getting short winded.  According to patient's son he does not use his oxygen  all the time as he forgets it.  Yesterday at home home oxygen  increased to 1 L to 2 L without any improvement of shortness of breath.  Patient also used albuterol  inhaler and nebulizer treatment without any relief.  He is also having productive cough with white mucoid sputum.  Denies any fever, chills, abdominal pain nausea, vomiting diarrhea.  Denies any bilateral lower extremity swelling, chest pain or palpitation.   Assessment & Plan:   Principal Problem:   COPD with acute exacerbation (HCC) Active Problems:   Acute kidney injury superimposed on chronic kidney disease (HCC)   Essential hypertension   Insulin  dependent type 2 diabetes mellitus (HCC)   Hyperlipidemia   History of lung cancer   Continuous dependence on cigarette smoking   History of BPH   Chronic anemia   Chronic hypoxic respiratory failure (HCC)  Acute on Chronic Hypoxic Respiratory Failure History of Lung cancer s/p Radiation Therapy  Typically on 1-2 L Requiring 4 L  CXR without acute  findings Due to COPD exacerbation and PE  Acute Pulmonary Embolism Elevated d dimer, VQ scan with abnormal perfusion scan highly concerning for PE Decreased and heterogenous perfusion of entire left lung and right upper lobe with multiple defects  Unable to get CT PE protocol due to his CKD Heparin  gtt Follow echo and LE US   Acute COPD Exacerbation Continue scheduled and prn nebs Continue steroids   AKI on CKD stage IV Very mild AKI when compared to baseline creatinine of 2.7 Improved today, will trend  T2DM with Hyperglycemia Continue basal/bolus and SSI  Note he's on TID 7/30 regimen, Lusero Nordlund bit unusual - watch for hypoglycemia   History of BPH -Continue Proscar    Chronic anemia -mild downtrend, will trend --anemia labs   Essential hypertension -Continue amlodipine     DVT prophylaxis: heparin  gtt Code Status: full Family Communication: son, Jeffrey Randall Disposition:   Status is: Observation The patient will require care spanning > 2 midnights and should be moved to inpatient because: continued need for inpatient care   Consultants:  none  Procedures:  none  Antimicrobials:  Anti-infectives (From admission, onward)    Start     Dose/Rate Route Frequency Ordered Stop   11/29/23 1945  azithromycin  (ZITHROMAX ) 500 mg in sodium chloride  0.9 % 250 mL IVPB        500 mg 250 mL/hr over 60 Minutes Intravenous Every 24 hours 11/29/23 1942 12/04/23 1944       Subjective: Continued c/o SOB   Objective: Vitals:   11/30/23 0742 11/30/23 0829 11/30/23 1014 11/30/23 1422  BP: ROLLEN)  127/44  (!) 147/52 (!) 141/44  Pulse: 71 68 68 64  Resp: 16 (!) 22  18  Temp: 98 F (36.7 C)   99 F (37.2 C)  TempSrc:      SpO2: 96% 97%  98%  Weight:      Height:        Intake/Output Summary (Last 24 hours) at 11/30/2023 1504 Last data filed at 11/30/2023 1100 Gross per 24 hour  Intake 360 ml  Output 450 ml  Net -90 ml   Filed Weights   11/29/23 1344  Weight: 63.5 kg     Examination:  General exam: Appears calm and comfortable  Respiratory system: frequent cough, scattered wheezing Cardiovascular system: RRR Gastrointestinal system: Abdomen is nondistended, soft and nontender. Central nervous system: Alert and oriented. No focal neurological deficits. Extremities: no LEE Skin: No rashes, lesions or ulcers Psychiatry: Judgement and insight appear normal. Mood & affect appropriate.     Data Reviewed: I have personally reviewed following labs and imaging studies  CBC: Recent Labs  Lab 11/29/23 1355 11/30/23 0422  WBC 9.5 9.1  HGB 10.1* 8.7*  HCT 32.1* 26.5*  MCV 97.3 96.4  PLT 347 321    Basic Metabolic Panel: Recent Labs  Lab 11/29/23 1355 11/30/23 0422  NA 136 143  K 4.0 3.7  CL 107 113*  CO2 22 19*  GLUCOSE 296* 158*  BUN 43* 43*  CREATININE 3.02* 2.88*  CALCIUM 8.5* 8.6*    GFR: Estimated Creatinine Clearance: 17.1 mL/min (Jeffrey Randall) (by C-G formula based on SCr of 2.88 mg/dL (H)).  Liver Function Tests: Recent Labs  Lab 11/30/23 0422  AST 12*  ALT 15  ALKPHOS 118  BILITOT 0.7  PROT 5.2*  ALBUMIN  1.9*    CBG: Recent Labs  Lab 11/29/23 1954 11/30/23 0209 11/30/23 0642 11/30/23 1112  GLUCAP 344* 128* 157* 214*     Recent Results (from the past 240 hours)  Resp panel by RT-PCR (RSV, Flu Jeffrey Randall&B, Covid) Anterior Nasal Swab     Status: None   Collection Time: 11/29/23  8:43 PM   Specimen: Anterior Nasal Swab  Result Value Ref Range Status   SARS Coronavirus 2 by RT PCR NEGATIVE NEGATIVE Final   Influenza Jeffrey Randall by PCR NEGATIVE NEGATIVE Final   Influenza B by PCR NEGATIVE NEGATIVE Final    Comment: (NOTE) The Xpert Xpress SARS-CoV-2/FLU/RSV plus assay is intended as an aid in the diagnosis of influenza from Nasopharyngeal swab specimens and should not be used as Jeffrey Randall sole basis for treatment. Nasal washings and aspirates are unacceptable for Xpert Xpress SARS-CoV-2/FLU/RSV testing.  Fact Sheet for  Patients: BloggerCourse.com  Fact Sheet for Healthcare Providers: SeriousBroker.it  This test is not yet approved or cleared by the [place]  FDA and has been authorized for detection and/or diagnosis of SARS-CoV-2 by FDA under an Emergency Use Authorization (EUA). This EUA will remain in effect (meaning this test can be used) for the duration of the COVID-19 declaration under Section 564(b)(1) of the Act, 21 U.S.C. section 360bbb-3(b)(1), unless the authorization is terminated or revoked.     Resp Syncytial Virus by PCR NEGATIVE NEGATIVE Final    Comment: (NOTE) Fact Sheet for Patients: BloggerCourse.com  Fact Sheet for Healthcare Providers: SeriousBroker.it  This test is not yet approved or cleared by the [place]  FDA and has been authorized for detection and/or diagnosis of SARS-CoV-2 by FDA under an Emergency Use Authorization (EUA). This EUA will remain in effect (meaning this test can be used)  for the duration of the COVID-19 declaration under Section 564(b)(1) of the Act, 21 U.S.C. section 360bbb-3(b)(1), unless the authorization is terminated or revoked.  Performed at [facility], [address] [place], [place] [address]          Radiology Studies: NM Pulmonary Perfusion Addendum Date: 11/30/2023 ADDENDUM REPORT: 11/30/2023 13:55 ADDENDUM: Critical Value/emergent results were called by telephone at the time of interpretation by Dr Duwaine Severs on November 30, 2023 at 10:55 Giovani [email or web address].m. to provider Dr. Starleen Monte who verbally acknowledged these results. Electronically Signed   By: Megan  Zare M.D.   On: 11/30/2023 13:55   Result Date: 11/30/2023 CLINICAL DATA:  Rule out pulmonary embolism. Right upper lobe malignancy EXAM: NUCLEAR MEDICINE PERFUSION LUNG SCAN TECHNIQUE: Perfusion images were obtained in multiple projections after intravenous injection of  radiopharmaceutical. Ventilation scans intentionally deferred if perfusion scan and chest x-ray adequate for interpretation during COVID 19 epidemic. RADIOPHARMACEUTICALS:  4.4 mCi Tc-45m MAA IV COMPARISON:  Chest radiograph November 29, 2023 and multiple radiographs and PET-CT dated back to 2022. FINDINGS: There is Ardena Gangl moderate to large perfusion defect involving the right upper lobe correlating to site of known malignancy. There is Clif Serio matched increased opacity in right lung apex in this region on chest radiograph correlates to site of known malignancy. There is diffuse heterogeneous patchy decreased left lung perfusion highly concerning for pulmonary embolus. No significant consolidation in left lung on radiograph. Exam is suboptimal given the lack of ventilation scan. Chest radiograph is used for comparison. IMPRESSION: Abnormal perfusion scan highly concerning for pulmonary embolism. Significant decreased and heterogeneous perfusion of the entire left lung and right upper lobe with multiple defects. Recommend dedicated CT angiogram for further assessment. (Please note right upper lobe perfusion defect also representing matched abnormality on radiograph, corresponds to site of known malignancy). Electronically Signed: By: Megan  Zare M.D. On: 11/30/2023 13:41   DG Chest 2 View Result Date: 11/29/2023 CLINICAL DATA:  Shortness of breath. EXAM: CHEST - 2 VIEW COMPARISON:  07/05/2023. FINDINGS: Stable cardiomediastinal contours. Aortic atherosclerosis. Chronic background emphysema and scarring. Chronic blunting of the left costophrenic angle. No focal consolidation, pleural effusion, or pneumothorax. No acute osseous abnormality. IMPRESSION: No acute cardiopulmonary findings. Electronically Signed   By: Harrietta Sherry M.D.   On: 11/29/2023 14:11        Scheduled Meds:  amLODipine   10 mg Oral Daily   aspirin  EC  81 mg Oral Daily   feeding supplement  237 mL Oral BID BM   finasteride   5 mg Oral Daily    guaiFENesin -dextromethorphan   10 mL Oral Q6H   heparin   3,500 Units Intravenous Once   insulin  aspart  0-20 Units Subcutaneous TID WC   insulin  aspart  0-5 Units Subcutaneous QHS   insulin  aspart protamine- aspart  10 Units Subcutaneous TID with meals   ipratropium-albuterol   3 mL Nebulization Q6H   methylPREDNISolone  (SOLU-MEDROL ) injection  40 mg Intravenous Q24H   sodium chloride  flush  3 mL Intravenous Q12H   Continuous Infusions:  sodium chloride      azithromycin  500 mg (11/29/23 2102)   heparin        LOS: 0 days    Time spent: over 30 min    Meliton Monte, MD [facility]   To contact the attending provider between 7A-7P or the covering provider during after hours 7P-7A, please log into the web site [email or web address] and access using universal [place] password for that web site. If you do not have the  password, please call the hospital operator.  11/30/2023, 3:04 PM

## 2023-11-30 NOTE — Care Management Obs Status (Signed)
 MEDICARE OBSERVATION STATUS NOTIFICATION   Patient Details  Name: Jeffrey Randall. MRN: [id number] Date of Birth: [birth year]   Medicare Observation Status Notification Given:  Yes    Jon Cruel 11/30/2023, 1:02 PM

## 2023-11-30 NOTE — Progress Notes (Signed)
 CGM reading is reading 69. PT given 1 4oz of grape juice. Pt didn't eat dinner. No at bed coverage given. RN will recheck after juice given. New CBG is 88. Pt encouraged to eat something.   Pt heparin  drip verified with off going Musician. 2000 units Bolus verified with Consulting civil engineer and with Environmental education officer. No signs of bleeding noted.   Hep drip increased from 9.5 to 11.

## 2023-12-01 ENCOUNTER — Telehealth (HOSPITAL_COMMUNITY): Payer: Self-pay | Admitting: Pharmacy Technician

## 2023-12-01 ENCOUNTER — Other Ambulatory Visit (HOSPITAL_COMMUNITY): Payer: Self-pay

## 2023-12-01 DIAGNOSIS — D631 Anemia in chronic kidney disease: Secondary | ICD-10-CM | POA: Diagnosis present

## 2023-12-01 DIAGNOSIS — D509 Iron deficiency anemia, unspecified: Secondary | ICD-10-CM | POA: Diagnosis present

## 2023-12-01 DIAGNOSIS — R0602 Shortness of breath: Secondary | ICD-10-CM | POA: Diagnosis present

## 2023-12-01 DIAGNOSIS — Z888 Allergy status to other drugs, medicaments and biological substances status: Secondary | ICD-10-CM | POA: Diagnosis not present

## 2023-12-01 DIAGNOSIS — Z886 Allergy status to analgesic agent status: Secondary | ICD-10-CM | POA: Diagnosis not present

## 2023-12-01 DIAGNOSIS — N179 Acute kidney failure, unspecified: Secondary | ICD-10-CM | POA: Diagnosis present

## 2023-12-01 DIAGNOSIS — F1021 Alcohol dependence, in remission: Secondary | ICD-10-CM | POA: Diagnosis present

## 2023-12-01 DIAGNOSIS — Z794 Long term (current) use of insulin: Secondary | ICD-10-CM | POA: Diagnosis not present

## 2023-12-01 DIAGNOSIS — J439 Emphysema, unspecified: Secondary | ICD-10-CM | POA: Diagnosis not present

## 2023-12-01 DIAGNOSIS — N400 Enlarged prostate without lower urinary tract symptoms: Secondary | ICD-10-CM | POA: Diagnosis present

## 2023-12-01 DIAGNOSIS — J441 Chronic obstructive pulmonary disease with (acute) exacerbation: Secondary | ICD-10-CM | POA: Diagnosis not present

## 2023-12-01 DIAGNOSIS — Z85118 Personal history of other malignant neoplasm of bronchus and lung: Secondary | ICD-10-CM | POA: Diagnosis not present

## 2023-12-01 DIAGNOSIS — I2699 Other pulmonary embolism without acute cor pulmonale: Secondary | ICD-10-CM | POA: Diagnosis present

## 2023-12-01 DIAGNOSIS — F17210 Nicotine dependence, cigarettes, uncomplicated: Secondary | ICD-10-CM | POA: Diagnosis present

## 2023-12-01 DIAGNOSIS — E43 Unspecified severe protein-calorie malnutrition: Secondary | ICD-10-CM | POA: Diagnosis present

## 2023-12-01 DIAGNOSIS — N184 Chronic kidney disease, stage 4 (severe): Secondary | ICD-10-CM | POA: Diagnosis present

## 2023-12-01 DIAGNOSIS — M109 Gout, unspecified: Secondary | ICD-10-CM | POA: Diagnosis present

## 2023-12-01 DIAGNOSIS — Z923 Personal history of irradiation: Secondary | ICD-10-CM | POA: Diagnosis not present

## 2023-12-01 DIAGNOSIS — I700 Atherosclerosis of aorta: Secondary | ICD-10-CM | POA: Diagnosis present

## 2023-12-01 DIAGNOSIS — E1122 Type 2 diabetes mellitus with diabetic chronic kidney disease: Secondary | ICD-10-CM | POA: Diagnosis present

## 2023-12-01 DIAGNOSIS — I129 Hypertensive chronic kidney disease with stage 1 through stage 4 chronic kidney disease, or unspecified chronic kidney disease: Secondary | ICD-10-CM | POA: Diagnosis present

## 2023-12-01 DIAGNOSIS — J9621 Acute and chronic respiratory failure with hypoxia: Secondary | ICD-10-CM | POA: Diagnosis present

## 2023-12-01 DIAGNOSIS — E1165 Type 2 diabetes mellitus with hyperglycemia: Secondary | ICD-10-CM | POA: Diagnosis present

## 2023-12-01 DIAGNOSIS — Z1152 Encounter for screening for COVID-19: Secondary | ICD-10-CM | POA: Diagnosis not present

## 2023-12-01 DIAGNOSIS — Z79899 Other long term (current) drug therapy: Secondary | ICD-10-CM | POA: Diagnosis not present

## 2023-12-01 DIAGNOSIS — E782 Mixed hyperlipidemia: Secondary | ICD-10-CM | POA: Diagnosis present

## 2023-12-01 DIAGNOSIS — R059 Cough, unspecified: Secondary | ICD-10-CM | POA: Diagnosis not present

## 2023-12-01 LAB — MAGNESIUM: Magnesium: 2.5 mg/dL — ABNORMAL HIGH (ref 1.7–2.4)

## 2023-12-01 LAB — ECHOCARDIOGRAM COMPLETE
Area-P 1/2: 3.17 cm2
Calc EF: 54.8 %
Height: 70 in
S' Lateral: 3 cm
Single Plane A2C EF: 53.5 %
Single Plane A4C EF: 57.4 %
Weight: 2240 [oz_av]

## 2023-12-01 LAB — CBC
HCT: 28.4 % — ABNORMAL LOW (ref 39.0–52.0)
Hemoglobin: 9.3 g/dL — ABNORMAL LOW (ref 13.0–17.0)
MCH: 31.3 pg (ref 26.0–34.0)
MCHC: 32.7 g/dL (ref 30.0–36.0)
MCV: 95.6 fL (ref 80.0–100.0)
Platelets: 357 K/uL (ref 150–400)
RBC: 2.97 MIL/uL — ABNORMAL LOW (ref 4.22–5.81)
RDW: 14.4 % (ref 11.5–15.5)
WBC: 8 K/uL (ref 4.0–10.5)
nRBC: 0 % (ref 0.0–0.2)

## 2023-12-01 LAB — COMPREHENSIVE METABOLIC PANEL WITH GFR
ALT: 17 U/L (ref 0–44)
AST: 14 U/L — ABNORMAL LOW (ref 15–41)
Albumin: 1.9 g/dL — ABNORMAL LOW (ref 3.5–5.0)
Alkaline Phosphatase: 116 U/L (ref 38–126)
Anion gap: 11 (ref 5–15)
BUN: 43 mg/dL — ABNORMAL HIGH (ref 8–23)
CO2: 19 mmol/L — ABNORMAL LOW (ref 22–32)
Calcium: 8.6 mg/dL — ABNORMAL LOW (ref 8.9–10.3)
Chloride: 107 mmol/L (ref 98–111)
Creatinine, Ser: 3.16 mg/dL — ABNORMAL HIGH (ref 0.61–1.24)
GFR, Estimated: 19 mL/min — ABNORMAL LOW (ref >60)
Glucose, Bld: 245 mg/dL — ABNORMAL HIGH (ref 70–99)
Potassium: 4.1 mmol/L (ref 3.5–5.1)
Sodium: 137 mmol/L (ref 135–145)
Total Bilirubin: 0.6 mg/dL (ref 0.0–1.2)
Total Protein: 5.6 g/dL — ABNORMAL LOW (ref 6.5–8.1)

## 2023-12-01 LAB — HEPARIN LEVEL (UNFRACTIONATED): Heparin Unfractionated: 0.18 [IU]/mL — ABNORMAL LOW (ref 0.30–0.70)

## 2023-12-01 LAB — GLUCOSE, CAPILLARY
Glucose-Capillary: 311 mg/dL — ABNORMAL HIGH (ref 70–99)
Glucose-Capillary: 369 mg/dL — ABNORMAL HIGH (ref 70–99)
Glucose-Capillary: 342 mg/dL — ABNORMAL HIGH (ref 70–99)
Glucose-Capillary: 269 mg/dL — ABNORMAL HIGH (ref 70–99)

## 2023-12-01 LAB — PHOSPHORUS: Phosphorus: 3.7 mg/dL (ref 2.5–4.6)

## 2023-12-01 MED ORDER — HEPARIN BOLUS VIA INFUSION
2000.0000 [IU] | Freq: Once | INTRAVENOUS | Status: AC
  Administered 2023-12-01: 2000 [IU] via INTRAVENOUS
  Filled 2023-12-01: qty 2000

## 2023-12-01 MED ORDER — INSULIN ASPART 100 UNIT/ML IJ SOLN
6.0000 [IU] | Freq: Three times a day (TID) | INTRAMUSCULAR | Status: DC
  Administered 2023-12-01 – 2023-12-02 (×3): 6 [IU] via SUBCUTANEOUS

## 2023-12-01 MED ORDER — PREDNISONE 20 MG PO TABS
40.0000 mg | ORAL_TABLET | Freq: Every day | ORAL | Status: DC
  Administered 2023-12-02: 40 mg via ORAL
  Filled 2023-12-01: qty 2

## 2023-12-01 MED ORDER — INSULIN GLARGINE 100 UNIT/ML ~~LOC~~ SOLN
25.0000 [IU] | Freq: Every day | SUBCUTANEOUS | Status: DC
  Administered 2023-12-01: 25 [IU] via SUBCUTANEOUS
  Filled 2023-12-01 (×2): qty 0.25

## 2023-12-01 MED ORDER — AZITHROMYCIN 250 MG PO TABS
500.0000 mg | ORAL_TABLET | Freq: Every day | ORAL | Status: DC
  Administered 2023-12-01: 500 mg via ORAL
  Filled 2023-12-01 (×2): qty 2

## 2023-12-01 MED ORDER — INSULIN ASPART 100 UNIT/ML IJ SOLN
0.0000 [IU] | Freq: Three times a day (TID) | INTRAMUSCULAR | Status: DC
  Administered 2023-12-01: 11 [IU] via SUBCUTANEOUS
  Administered 2023-12-02 (×2): 5 [IU] via SUBCUTANEOUS

## 2023-12-01 MED ORDER — HEPARIN BOLUS VIA INFUSION
2000.0000 [IU] | Freq: Once | INTRAVENOUS | Status: AC
  Administered 2023-12-01: 2000 [IU] via INTRAVENOUS

## 2023-12-01 MED ORDER — INSULIN ASPART 100 UNIT/ML IJ SOLN
0.0000 [IU] | Freq: Every day | INTRAMUSCULAR | Status: DC
  Administered 2023-12-01: 3 [IU] via SUBCUTANEOUS

## 2023-12-01 MED ORDER — APIXABAN 5 MG PO TABS: 5.0000 mg | ORAL_TABLET | Freq: Two times a day (BID) | ORAL | Status: DC

## 2023-12-01 MED ORDER — METHOCARBAMOL 500 MG PO TABS
500.0000 mg | ORAL_TABLET | Freq: Three times a day (TID) | ORAL | Status: DC | PRN
  Administered 2023-12-01: 500 mg via ORAL
  Filled 2023-12-01: qty 1

## 2023-12-01 MED ORDER — FERROUS SULFATE 325 (65 FE) MG PO TABS
325.0000 mg | ORAL_TABLET | ORAL | Status: DC
  Administered 2023-12-01: 325 mg via ORAL
  Filled 2023-12-01: qty 1

## 2023-12-01 MED ORDER — APIXABAN 5 MG PO TABS
10.0000 mg | ORAL_TABLET | Freq: Two times a day (BID) | ORAL | Status: DC
  Administered 2023-12-01 – 2023-12-02 (×3): 10 mg via ORAL
  Filled 2023-12-01 (×3): qty 2

## 2023-12-01 NOTE — Progress Notes (Signed)
 PROGRESS NOTE    Jeffrey Randall.  FMW:[id number] DOB: 1941-02-22 DOA: 11/29/2023 PCP: Sheldon Netter, PA  Chief Complaint  Patient presents with   Shortness of Breath    Brief Narrative:   Jeffrey Treptow. is Jeffrey Randall 83 y.o. male with medical history significant of chronic hypoxic respiratory failure, COPD, CKD 3B/4, essential hypertension, insulin -dependent DM type II, history of lung cancer status postradiation, gout, BPH, chronic normocytic anemia and hyperlipidemia presenting to emergency department complaining of shortness of breath for last 3 days.  Patient son at the bedside who provided the history primarily.  Per patient's son patient is having harder time breathing anytime he does any activity.  He also smoke tobacco daily but stopped smoking 4 days ago because he is not feeling well.  Reported unable to get any kind of movement without getting short winded.  According to patient's son he does not use his oxygen  all the time as he forgets it.  Yesterday at home home oxygen  increased to 1 L to 2 L without any improvement of shortness of breath.  Patient also used albuterol  inhaler and nebulizer treatment without any relief.  He is also having productive cough with white mucoid sputum.  Denies any fever, chills, abdominal pain nausea, vomiting diarrhea.  Denies any bilateral lower extremity swelling, chest pain or palpitation.   Assessment & Plan:   Principal Problem:   COPD with acute exacerbation (HCC) Active Problems:   Acute kidney injury superimposed on chronic kidney disease (HCC)   Essential hypertension   Insulin  dependent type 2 diabetes mellitus (HCC)   Hyperlipidemia   History of lung cancer   Continuous dependence on cigarette smoking   History of BPH   Chronic anemia   Chronic hypoxic respiratory failure (HCC)   Pulmonary embolism (HCC)  Acute on Chronic Hypoxic Respiratory Failure History of Lung cancer s/p Radiation Therapy  Typically on 1-2 L Today on 2 L,  will continue to wean as tolerated CXR without acute findings Due to COPD exacerbation and PE  Acute Pulmonary Embolism Elevated d dimer, VQ scan with abnormal perfusion scan highly concerning for PE Decreased and heterogenous perfusion of entire left lung and right upper lobe with multiple defects  Unable to get CT PE protocol due to his CKD Heparin  gtt -> will transition to eliquis  today Follow echo (preserved EF, normal RVSF) and LE US  (negative for DVT)  Acute COPD Exacerbation Continue scheduled and prn nebs Continue steroids   AKI on CKD stage IV Very mild AKI when compared to baseline creatinine of 2.7 Fluctuating, 3.16 today, will monitor   T2DM with Hyperglycemia Continue basal/bolus and SSI  Note he's on TID 70/30 regimen at home - Jeffrey Randall bit unusual Transition to basal/bolus + SSI here, appreciate diabetic coordinator assistance  Hyperglycemic - wean steroids as able    History of BPH -Continue Proscar    Chronic anemia -mild downtrend, will trend --anemia labs  - labs c/w aocd/iron def - normal folate, elevated b12   Essential hypertension -Continue amlodipine     DVT prophylaxis: heparin  gtt Code Status: full Family Communication: son, Buren Disposition:   Status is: Observation The patient will require care spanning > 2 midnights and should be moved to inpatient because: continued need for inpatient care   Consultants:  none  Procedures:  none  Antimicrobials:  Anti-infectives (From admission, onward)    Start     Dose/Rate Route Frequency Ordered Stop   12/01/23 1400  azithromycin  (ZITHROMAX ) tablet 500 mg  500 mg Oral Daily 12/01/23 0931 12/03/23 0959   11/29/23 1945  azithromycin  (ZITHROMAX ) 500 mg in sodium chloride  0.9 % 250 mL IVPB  Status:  Discontinued        500 mg 250 mL/hr over 60 Minutes Intravenous Every 24 hours 11/29/23 1942 12/01/23 0931       Subjective: No new complaints  Objective: Vitals:   11/30/23 2009 12/01/23  0400 12/01/23 0755 12/01/23 0807  BP:  (!) 142/52 (!) 135/47   Pulse:  72 70 80  Resp:  17 17 19   Temp:  98.7 F (37.1 C) 98.2 F (36.8 C)   TempSrc:  Oral Oral   SpO2: 98% 98% 100% 98%  Weight:      Height:        Intake/Output Summary (Last 24 hours) at 12/01/2023 1407 Last data filed at 12/01/2023 0900 Gross per 24 hour  Intake 1266.81 ml  Output 1250 ml  Net 16.81 ml   Filed Weights   11/29/23 1344  Weight: 63.5 kg    Examination:  General: No acute distress. Cardiovascular: RRR Lungs: some scattered rhonchi, less wheezing appreciated today Abdomen: Soft, nontender, nondistended  Neurological: Alert and oriented 3. Moves all extremities 4 with equal strength. Cranial nerves II through XII grossly intact. Extremities: No clubbing or cyanosis. No edema.   Data Reviewed: I have personally reviewed following labs and imaging studies  CBC: Recent Labs  Lab 11/29/23 1355 11/30/23 0422 11/30/23 1817 12/01/23 0436  WBC 9.5 9.1  --  8.0  HGB 10.1* 8.7* 9.8* 9.3*  HCT 32.1* 26.5* 30.3* 28.4*  MCV 97.3 96.4  --  95.6  PLT 347 321  --  357    Basic Metabolic Panel: Recent Labs  Lab 11/29/23 1355 11/30/23 0422 12/01/23 0436  NA 136 143 137  K 4.0 3.7 4.1  CL 107 113* 107  CO2 22 19* 19*  GLUCOSE 296* 158* 245*  BUN 43* 43* 43*  CREATININE 3.02* 2.88* 3.16*  CALCIUM 8.5* 8.6* 8.6*  MG  --   --  2.5*  PHOS  --   --  3.7    GFR: Estimated Creatinine Clearance: 15.6 mL/min (Jeffrey Randall) (by C-G formula based on SCr of 3.16 mg/dL (H)).  Liver Function Tests: Recent Labs  Lab 11/30/23 0422 12/01/23 0436  AST 12* 14*  ALT 15 17  ALKPHOS 118 116  BILITOT 0.7 0.6  PROT 5.2* 5.6*  ALBUMIN  1.9* 1.9*    CBG: Recent Labs  Lab 11/30/23 1112 11/30/23 1816 11/30/23 2130 12/01/23 0644 12/01/23 1151  GLUCAP 214* 262* 88 311* 369*     Recent Results (from the past 240 hours)  Resp panel by RT-PCR (RSV, Flu Jeffrey Randall&B, Covid) Anterior Nasal Swab     Status: None    Collection Time: 11/29/23  8:43 PM   Specimen: Anterior Nasal Swab  Result Value Ref Range Status   SARS Coronavirus 2 by RT PCR NEGATIVE NEGATIVE Final   Influenza Jeffrey Randall by PCR NEGATIVE NEGATIVE Final   Influenza B by PCR NEGATIVE NEGATIVE Final    Comment: (NOTE) The Xpert Xpress SARS-CoV-2/FLU/RSV plus assay is intended as an aid in the diagnosis of influenza from Nasopharyngeal swab specimens and should not be used as Dacey Milberger sole basis for treatment. Nasal washings and aspirates are unacceptable for Xpert Xpress SARS-CoV-2/FLU/RSV testing.  Fact Sheet for Patients: BloggerCourse.com  Fact Sheet for Healthcare Providers: SeriousBroker.it  This test is not yet approved or cleared by the [place]  FDA and has been  authorized for detection and/or diagnosis of SARS-CoV-2 by FDA under an Emergency Use Authorization (EUA). This EUA will remain in effect (meaning this test can be used) for the duration of the COVID-19 declaration under Section 564(b)(1) of the Act, 21 U.S.C. section 360bbb-3(b)(1), unless the authorization is terminated or revoked.     Resp Syncytial Virus by PCR NEGATIVE NEGATIVE Final    Comment: (NOTE) Fact Sheet for Patients: BloggerCourse.com  Fact Sheet for Healthcare Providers: SeriousBroker.it  This test is not yet approved or cleared by the [place]  FDA and has been authorized for detection and/or diagnosis of SARS-CoV-2 by FDA under an Emergency Use Authorization (EUA). This EUA will remain in effect (meaning this test can be used) for the duration of the COVID-19 declaration under Section 564(b)(1) of the Act, 21 U.S.C. section 360bbb-3(b)(1), unless the authorization is terminated or revoked.  Performed at [facility], [address] [place], [place] [address]          Radiology Studies: ECHOCARDIOGRAM COMPLETE Result Date:  12/01/2023    ECHOCARDIOGRAM REPORT   Patient Name:   Jeffrey Randall. Date of Exam: 11/30/2023 Medical Rec #:  [id number]           Height:       70.0 in Accession #:    [id number]          Weight:       140.0 lb Date of Birth:  01/23/1941            BSA:          1.794 m Patient Age:    83 years            BP:           144/41 mmHg Patient Gender: M                   HR:           70 bpm. Exam Location:  Inpatient Procedure: 2D Echo (Both Spectral and Color Flow Doppler were utilized during            procedure). Indications:    Shortness of breath  History:        Patient has prior history of Echocardiogram examinations, most                 recent 07/02/2023. COPD, Signs/Symptoms:Chest Pain and Shortness                 of Breath; Risk Factors:Diabetes, Current Smoker and                 Hypertension. Lung cancer.  Sonographer:    Juliene Rucks Referring Phys: [phone] Maralyn Witherell CALDWELL POWELL JR IMPRESSIONS  1. Left ventricular ejection fraction, by estimation, is 60 to 65%. The left ventricle has normal function. The left ventricle has no regional wall motion abnormalities. There is mild concentric left ventricular hypertrophy. Left ventricular diastolic parameters are consistent with Grade I diastolic dysfunction (impaired relaxation).  2. Right ventricular systolic function is normal. The right ventricular size is normal.  3. The mitral valve is normal in structure. Trivial mitral valve regurgitation. No evidence of mitral stenosis.  4. The aortic valve is tricuspid. There is moderate calcification of the aortic valve. Aortic valve regurgitation is not visualized. Aortic valve sclerosis/calcification is present, without any evidence of aortic stenosis.  5. The inferior vena cava is normal in size with greater than 50% respiratory variability, suggesting right  atrial pressure of 3 mmHg. FINDINGS  Left Ventricle: Left ventricular ejection fraction, by estimation, is 60 to 65%. The left ventricle has normal function. The  left ventricle has no regional wall motion abnormalities. The left ventricular internal cavity size was normal in size. There is  mild concentric left ventricular hypertrophy. Left ventricular diastolic parameters are consistent with Grade I diastolic dysfunction (impaired relaxation). Right Ventricle: The right ventricular size is normal. No increase in right ventricular wall thickness. Right ventricular systolic function is normal. Left Atrium: Left atrial size was normal in size. Right Atrium: Right atrial size was normal in size. Pericardium: There is no evidence of pericardial effusion. Mitral Valve: The mitral valve is normal in structure. Mild mitral annular calcification. Trivial mitral valve regurgitation. No evidence of mitral valve stenosis. Tricuspid Valve: The tricuspid valve is normal in structure. Tricuspid valve regurgitation is trivial. No evidence of tricuspid stenosis. Aortic Valve: The aortic valve is tricuspid. There is moderate calcification of the aortic valve. Aortic valve regurgitation is not visualized. Aortic valve sclerosis/calcification is present, without any evidence of aortic stenosis. Pulmonic Valve: The pulmonic valve was normal in structure. Pulmonic valve regurgitation is trivial. No evidence of pulmonic stenosis. Aorta: The aortic root is normal in size and structure. Venous: The inferior vena cava is normal in size with greater than 50% respiratory variability, suggesting right atrial pressure of 3 mmHg. IAS/Shunts: No atrial level shunt detected by color flow Doppler.  LEFT VENTRICLE PLAX 2D LVIDd:         4.30 cm      Diastology LVIDs:         3.00 cm      LV e' medial:    5.44 cm/s LV PW:         1.30 cm      LV E/e' medial:  17.3 LV IVS:        1.00 cm      LV e' lateral:   9.14 cm/s LVOT diam:     1.90 cm      LV E/e' lateral: 10.3 LV SV:         78 LV SV Index:   43 LVOT Area:     2.84 cm  LV Volumes (MOD) LV vol d, MOD A2C: 127.0 ml LV vol d, MOD A4C: 159.0 ml LV vol s,  MOD A2C: 59.1 ml LV vol s, MOD A4C: 67.8 ml LV SV MOD A2C:     67.9 ml LV SV MOD A4C:     159.0 ml LV SV MOD BP:      77.7 ml RIGHT VENTRICLE             IVC RV Basal diam:  3.70 cm     IVC diam: 1.60 cm RV Mid diam:    3.10 cm RV S prime:     13.10 cm/s TAPSE (M-mode): 1.8 cm LEFT ATRIUM           Index        RIGHT ATRIUM           Index LA diam:      4.10 cm 2.29 cm/m   RA Area:     19.60 cm LA Vol (A2C): 57.0 ml 31.78 ml/m  RA Volume:   57.20 ml  31.89 ml/m LA Vol (A4C): 29.0 ml 16.17 ml/m  AORTIC VALVE LVOT Vmax:   112.00 cm/s LVOT Vmean:  77.100 cm/s LVOT VTI:    0.274 m  AORTA Ao Root diam: 3.30  cm Ao Asc diam:  3.30 cm MITRAL VALVE MV Area (PHT): 3.17 cm     SHUNTS MV Decel Time: 239 msec     Systemic VTI:  0.27 m MV E velocity: 93.90 cm/s   Systemic Diam: 1.90 cm MV Blanche Scovell velocity: 105.00 cm/s MV E/Carlyle Mcelrath ratio:  0.89 Toribio Fuel MD Electronically signed by Toribio Fuel MD Signature Date/Time: 12/01/2023/11:50:46 AM    Final    VAS US  LOWER EXTREMITY VENOUS (DVT) Result Date: 11/30/2023  Lower Venous DVT Study Patient Name:  Jeffrey Randall.  Date of Exam:   11/30/2023 Medical Rec #: [id number]            Accession #:    [id number] Date of Birth: [birth year]             Patient Gender: M Patient Age:   25 years Exam Location:  [facility] Procedure:      VAS US  LOWER EXTREMITY VENOUS (DVT) Referring Phys: Angeleen Horney POWELL JR --------------------------------------------------------------------------------  Indications: SOB, and possible PE seen on VQ scan.  Comparison Study: Previous exam on 07/03/2023 was negative for DVT Performing Technologist: Ezzie Potters RVT, RDMS  Examination Guidelines: Shaquisha Wynn complete evaluation includes B-mode imaging, spectral Doppler, color Doppler, and power Doppler as needed of all accessible portions of each vessel. Bilateral testing is considered an integral part of Yaviel Kloster complete examination. Limited examinations for reoccurring indications may be performed as noted. The reflux  portion of the exam is performed with the patient in reverse Trendelenburg.  +---------+---------------+---------+-----------+----------+--------------+ RIGHT    CompressibilityPhasicitySpontaneityPropertiesThrombus Aging +---------+---------------+---------+-----------+----------+--------------+ CFV      Full           Yes      Yes                                 +---------+---------------+---------+-----------+----------+--------------+ SFJ      Full                                                        +---------+---------------+---------+-----------+----------+--------------+ FV Prox  Full           Yes      Yes                                 +---------+---------------+---------+-----------+----------+--------------+ FV Mid   Full           Yes      Yes                                 +---------+---------------+---------+-----------+----------+--------------+ FV DistalFull           Yes      Yes                                 +---------+---------------+---------+-----------+----------+--------------+ PFV      Full                                                        +---------+---------------+---------+-----------+----------+--------------+  POP      Full           Yes      Yes                                 +---------+---------------+---------+-----------+----------+--------------+ PTV      Full                                                        +---------+---------------+---------+-----------+----------+--------------+ PERO     Full                                                        +---------+---------------+---------+-----------+----------+--------------+   +---------+---------------+---------+-----------+----------+--------------+ LEFT     CompressibilityPhasicitySpontaneityPropertiesThrombus Aging +---------+---------------+---------+-----------+----------+--------------+ CFV      Full           Yes      Yes                                  +---------+---------------+---------+-----------+----------+--------------+ SFJ      Full                                                        +---------+---------------+---------+-----------+----------+--------------+ FV Prox  Full           Yes      Yes                                 +---------+---------------+---------+-----------+----------+--------------+ FV Mid   Full           Yes      Yes                                 +---------+---------------+---------+-----------+----------+--------------+ FV DistalFull           Yes      Yes                                 +---------+---------------+---------+-----------+----------+--------------+ PFV      Full                                                        +---------+---------------+---------+-----------+----------+--------------+ POP      Full           Yes      Yes                                 +---------+---------------+---------+-----------+----------+--------------+ PTV  Full                                                        +---------+---------------+---------+-----------+----------+--------------+ PERO     Full                                                        +---------+---------------+---------+-----------+----------+--------------+     Summary: BILATERAL: - No evidence of deep vein thrombosis seen in the lower extremities, bilaterally. -No evidence of popliteal cyst, bilaterally.  LEFT: - Ultrasound characteristics of enlarged lymph nodes noted in the groin.  *See table(s) above for measurements and observations. Electronically signed by Gaile New MD on 11/30/2023 at 10:46:15 PM.    Final    NM Pulmonary Perfusion Addendum Date: 11/30/2023 ADDENDUM REPORT: 11/30/2023 13:55 ADDENDUM: Critical Value/emergent results were called by telephone at the time of interpretation by Dr Duwaine Severs on November 30, 2023 at 10:55 Ceola Para.m. to provider Dr. Starleen Monte who verbally acknowledged these results. Electronically Signed   By: Megan  Zare M.D.   On: 11/30/2023 13:55   Result Date: 11/30/2023 CLINICAL DATA:  Rule out pulmonary embolism. Right upper lobe malignancy EXAM: NUCLEAR MEDICINE PERFUSION LUNG SCAN TECHNIQUE: Perfusion images were obtained in multiple projections after intravenous injection of radiopharmaceutical. Ventilation scans intentionally deferred if perfusion scan and chest x-ray adequate for interpretation during COVID 19 epidemic. RADIOPHARMACEUTICALS:  4.4 mCi Tc-43m MAA IV COMPARISON:  Chest radiograph November 29, 2023 and multiple radiographs and PET-CT dated back to 2022. FINDINGS: There is Arnetia Bronk moderate to large perfusion defect involving the right upper lobe correlating to site of known malignancy. There is Nishtha Raider matched increased opacity in right lung apex in this region on chest radiograph correlates to site of known malignancy. There is diffuse heterogeneous patchy decreased left lung perfusion highly concerning for pulmonary embolus. No significant consolidation in left lung on radiograph. Exam is suboptimal given the lack of ventilation scan. Chest radiograph is used for comparison. IMPRESSION: Abnormal perfusion scan highly concerning for pulmonary embolism. Significant decreased and heterogeneous perfusion of the entire left lung and right upper lobe with multiple defects. Recommend dedicated CT angiogram for further assessment. (Please note right upper lobe perfusion defect also representing matched abnormality on radiograph, corresponds to site of known malignancy). Electronically Signed: By: Megan  Zare M.D. On: 11/30/2023 13:41        Scheduled Meds:  amLODipine   10 mg Oral Daily   aspirin  EC  81 mg Oral Daily   azithromycin   500 mg Oral Daily   feeding supplement  237 mL Oral BID BM   finasteride   5 mg Oral Daily   guaiFENesin -dextromethorphan   10 mL Oral Q6H   insulin  aspart  0-20 Units Subcutaneous TID WC   insulin   aspart  0-5 Units Subcutaneous QHS   insulin  aspart protamine- aspart  10 Units Subcutaneous TID with meals   ipratropium-albuterol   3 mL Nebulization Q6H   methylPREDNISolone  (SOLU-MEDROL ) injection  40 mg Intravenous Q24H   sodium chloride  flush  3 mL Intravenous Q12H   Continuous Infusions:  heparin  1,250 Units/hr (12/01/23 1044)     LOS: 0 days    Time spent:  over 30 min    Meliton Monte, MD [facility]   To contact the attending provider between 7A-7P or the covering provider during after hours 7P-7A, please log into the web site [email or web address] and access using universal [place] password for that web site. If you do not have the password, please call the hospital operator.  12/01/2023, 2:07 PM

## 2023-12-01 NOTE — Progress Notes (Signed)
 This patient is receiving the antibiotic azithromcyin by the intravenous route.   Based on criteria approved by the Pharmacy and Therapeutics Committee, and the  Infectious Disease Division, the antibiotic(s) is / are being converted to equivalent oral dose form(s). These criteria include:  Patient being treated for a respiratory tract infection, urinary tract infection, cellulitis, or Clostridium Difficile associated diarrhea  The patient is not neutropenic and does not exhibit a GI malabsorption state  The patient is eating (either orally or per tube) and/or has been taking other orally administered medications for at least 24 hours.  The patient is improving clinically (physician assessment and a 24-hour Tmax of 100.5 F).   If you have questions about this conversion, please contact the pharmacy department.   Thank you for allowing pharmacy to be a part of this patient's care.  Shelba Collier, PharmD, BCPS Clinical Pharmacist

## 2023-12-01 NOTE — Plan of Care (Signed)

## 2023-12-01 NOTE — Progress Notes (Signed)
 PHARMACY - ANTICOAGULATION CONSULT NOTE  Pharmacy Consult for heparin  Indication: pulmonary embolus  Allergies  Allergen Reactions   Chantix  Continuing Month Elia.Earls ] Other (See Comments)    Doesn't work for patient   Januvia [Sitagliptin] Other (See Comments)    Class Contraindicated   Lisinopril Swelling    Possible tongue swelling. ARB's okay   Motrin [Ibuprofen] Other (See Comments)    Contraindicated by Renal disease   Remeron [Mirtazapine] Other (See Comments)    Excess somnolence on the 15 mg dose   Penicillins Other (See Comments)    Facial swelling    Patient Measurements: Height: 5' 10 (177.8 cm) Weight: 63.5 kg (140 lb) IBW/kg (Calculated) : 73 HEPARIN  DW (KG): 63.5  Vital Signs: Temp: 98.9 F (37.2 C) (08/20 1930) Temp Source: Oral (08/20 1930) BP: 147/44 (08/20 1930) Pulse Rate: 76 (08/20 1930)  Labs: Recent Labs    11/29/23 1355 11/29/23 1855 11/29/23 2232 11/30/23 0422 11/30/23 1817 11/30/23 2232  HGB 10.1*  --   --  8.7* 9.8*  --   HCT 32.1*  --   --  26.5* 30.3*  --   PLT 347  --   --  321  --   --   HEPARINUNFRC  --   --   --   --   --  <0.10*  CREATININE 3.02*  --   --  2.88*  --   --   TROPONINIHS  --  24* 27*  --   --   --     Estimated Creatinine Clearance: 17.1 mL/min (A) (by C-G formula based on SCr of 2.88 mg/dL (H)).   Assessment: 83 y.o. male with medical history significant of history of lung cancer s/p radiation, chronic normocytic anemia not on anticoagulation PTA who presented to the emergency department complaining of shortness of breath. VQ perfusion scan highly concerning for pulmonary embolism. Significant decreased and heterogeneous perfusion of the entire left lung and right upper lobe with multiple defects. CBC stable, platelets 321.   8/21 AM update:  Heparin  level sub-therapeutic   Goal of Therapy:  Heparin  level 0.3-0.7 units/ml Monitor platelets by anticoagulation protocol: Yes   Plan:  Heparin  2000  units re-bol[employer] heparin  to 1100 units/hr Check heparin  level in 8 hours and daily while on heparin  Continue to monitor H&H and platelets  Lynwood Mckusick, PharmD, BCPS Clinical Pharmacist Phone: [phone]

## 2023-12-01 NOTE — Inpatient Diabetes Management (Signed)
 Inpatient Diabetes Program Recommendations  AACE/ADA: New Consensus Statement on Inpatient Glycemic Control (2015)  Target Ranges:  Prepandial:   less than 140 mg/dL      Peak postprandial:   less than 180 mg/dL (1-2 hours)      Critically ill patients:  140 - 180 mg/dL   Lab Results  Component Value Date   GLUCAP 311 (H) 12/01/2023   HGBA1C 9.9 (H) 06/29/2023    Review of Glycemic Control  Latest Reference Range & Units 11/30/23 06:42 11/30/23 11:12 11/30/23 18:16 11/30/23 21:30 12/01/23 06:44  Glucose-Capillary 70 - 99 mg/dL 842 (H) 785 (H) 737 (H) 88 311 (H)   Diabetes history: DM 2 Outpatient Diabetes medications: Farxiga 10 mg Daily, 75/25 8 units bid, Tresiba  10 units Daily Current orders for Inpatient glycemic control:  Novolog  0-20 units tid + hs 70/30 10 units tid  Ensure Plus High Protein (19 grams of carbohydrates) Solumedrol 40 mg Q24  Inpatient Diabetes Program Recommendations:    -   d/c 70/30 -   Start semglee  25 units -   Start Novolog  meal coverage 6 units tid if eating >50% of meals -   Reduce Correction scale to 0-15 units tid + hs (elevated renal function)  Thanks,  Clotilda Bull RN, MSN, BC-ADM Inpatient Diabetes Coordinator Team Pager [phone] (8a-5p)

## 2023-12-01 NOTE — Progress Notes (Signed)
 PHARMACY - ANTICOAGULATION CONSULT NOTE  Pharmacy Consult for heparin  Indication: pulmonary embolus  Allergies  Allergen Reactions   Chantix  Continuing Month [Varenicline ] Other (See Comments)    Doesn't work for patient   Januvia [Sitagliptin] Other (See Comments)    Class Contraindicated   Lisinopril Swelling    Possible tongue swelling. ARB's okay   Motrin [Ibuprofen] Other (See Comments)    Contraindicated by Renal disease   Remeron [Mirtazapine] Other (See Comments)    Excess somnolence on the 15 mg dose   Penicillins Other (See Comments)    Facial swelling    Patient Measurements: Height: 5' 10 (177.8 cm) Weight: 63.5 kg (140 lb) IBW/kg (Calculated) : 73 HEPARIN  DW (KG): 63.5  Vital Signs: Temp: 98.2 F (36.8 C) (08/21 0755) Temp Source: Oral (08/21 0755) BP: 135/47 (08/21 0755) Pulse Rate: 80 (08/21 0807)  Labs: Recent Labs    11/29/23 1355 11/29/23 1855 11/29/23 2232 11/30/23 0422 11/30/23 1817 11/30/23 2232 12/01/23 0436 12/01/23 0911  HGB 10.1*  --   --  8.7* 9.8*  --  9.3*  --   HCT 32.1*  --   --  26.5* 30.3*  --  28.4*  --   PLT 347  --   --  321  --   --  357  --   HEPARINUNFRC  --   --   --   --   --  <0.10*  --  0.18*  CREATININE 3.02*  --   --  2.88*  --   --  3.16*  --   TROPONINIHS  --  24* 27*  --   --   --   --   --     Estimated Creatinine Clearance: 15.6 mL/min (A) (by C-G formula based on SCr of 3.16 mg/dL (H)).   Assessment: 83 y.o. male with medical history significant of history of lung cancer s/p radiation, chronic normocytic anemia not on anticoagulation PTA who presented to the emergency department complaining of shortness of breath. VQ perfusion scan highly concerning for pulmonary embolism. Significant decreased and heterogeneous perfusion of the entire left lung and right upper lobe with multiple defects. CBC stable, platelets 357.   Goal of Therapy:  Heparin  level 0.3-0.7 units/ml Monitor platelets by anticoagulation  protocol: Yes   Plan:   Rebolus IV heparin  2000 units x1 Increase heparin  infusion to 1250 units/hr Check heparin  level in 8 hours and daily while on heparin  Continue to monitor H&H and platelets  Thank you for allowing pharmacy to be a part of this patient's care.  Shelba Collier, PharmD, BCPS Clinical Pharmacist

## 2023-12-01 NOTE — Telephone Encounter (Signed)

## 2023-12-01 NOTE — Progress Notes (Signed)
 PHARMACY - ANTICOAGULATION CONSULT NOTE  Pharmacy Consult for heparin  Indication: pulmonary embolus  Allergies  Allergen Reactions   Chantix  Continuing Month [Varenicline ] Other (See Comments)    Doesn't work for patient   Januvia [Sitagliptin] Other (See Comments)    Class Contraindicated   Lisinopril Swelling    Possible tongue swelling. ARB's okay   Motrin [Ibuprofen] Other (See Comments)    Contraindicated by Renal disease   Remeron [Mirtazapine] Other (See Comments)    Excess somnolence on the 15 mg dose   Penicillins Other (See Comments)    Facial swelling    Patient Measurements: Height: 5' 10 (177.8 cm) Weight: 63.5 kg (140 lb) IBW/kg (Calculated) : 73 HEPARIN  DW (KG): 63.5  Vital Signs: Temp: 98.1 F (36.7 C) (08/21 1439) Temp Source: Oral (08/21 1439) BP: 151/52 (08/21 1439) Pulse Rate: 77 (08/21 1439)  Labs: Recent Labs    11/29/23 1355 11/29/23 1855 11/29/23 2232 11/30/23 0422 11/30/23 1817 11/30/23 2232 12/01/23 0436 12/01/23 0911  HGB 10.1*  --   --  8.7* 9.8*  --  9.3*  --   HCT 32.1*  --   --  26.5* 30.3*  --  28.4*  --   PLT 347  --   --  321  --   --  357  --   HEPARINUNFRC  --   --   --   --   --  <0.10*  --  0.18*  CREATININE 3.02*  --   --  2.88*  --   --  3.16*  --   TROPONINIHS  --  24* 27*  --   --   --   --   --     Estimated Creatinine Clearance: 15.6 mL/min (A) (by C-G formula based on SCr of 3.16 mg/dL (H)).   Assessment: 83 y.o. male with medical history significant of history of lung cancer s/p radiation, chronic normocytic anemia not on anticoagulation PTA who presented to the emergency department complaining of shortness of breath. VQ perfusion scan highly concerning for pulmonary embolism. Significant decreased and heterogeneous perfusion of the entire left lung and right upper lobe with multiple defects. CBC stable, platelets 357.   To transition to Eliquis   Goal of Therapy:  Heparin  level 0.3-0.7 units/ml Monitor  platelets by anticoagulation protocol: Yes   Plan:  Stop IV heparin  Start Eliquis  10mg  PO BID x 7 days, then 5mg  PO BID thereafter Will ask Rx patient advocate team for benefits check Will provide education prior to discharge  Thank you for allowing pharmacy to be a part of this patient's care.  Rocky Slade, PharmD, BCPS Clinical Pharmacist

## 2023-12-01 NOTE — Progress Notes (Signed)
 Initial Nutrition Assessment  DOCUMENTATION CODES:   Severe malnutrition in context of chronic illness (COPD, chronic anemia, respiratory failure, lung cancer)  INTERVENTION:  Ensure Plus High Protein po BID, each supplement provides 350 kcal and 20 grams of protein.  Magic cup TID with meals, each supplement provides 290 kcal and 9 grams of protein  Liberalized diet to carb modified to provide increased options and promote adequate intake while monitoring carbohydrate intake to manage blood sugars  Added nutrition instructions/ Ensure coupons and High Calorie, High Protein Nutrition Therapy handout from [employer] to AVS   NUTRITION DIAGNOSIS:   Severe Malnutrition related to chronic illness (COPD, chronic anemia, respiratory failure, lung cancer) as evidenced by severe muscle depletion, severe fat depletion, percent weight loss.  GOAL:   Patient will meet greater than or equal to 90% of their needs  MONITOR:   PO intake, Supplement acceptance  REASON FOR ASSESSMENT:   Malnutrition Screening Tool    ASSESSMENT:   Pt with hx of COPD, chronic hypoxic respiratory failure, HTN, insulin -dependent diabetes, chronic anemia, HLD, and lung cancer (s/p radiation). Admitted with SOB/COPD exacerbation.  8/19 admitted 8/20 SLP eval cleared for regular diet and thins  Spoke with pt who was awake and alert, pt's son and grandson at bedside. Pt reports his appetite is so so. Pt reports he has had poor appetite for some time and sometimes he does not feel like eating. Pt appears to be eating well, diet summary shows 88% average intake over 2 recorded meals. Pt reports his breathing has improved and he is eager to get home.   PTA, pt and pt's son report pt's appetite has been poor for last few months. Pt states he would only eat small bites of foods throughout the day. Pt unable to provide full diet hx or examples of foods he would eat. Pt did report he enjoys all  types of sweet treats. Pt's son stated pt does eat because he doesn't like the food his son cooks. Pt denied claim with laughter. Pt did report drinking Ensure shakes PTA. Suspect poor nutrition intake PTA related to continued breathing trouble due to COPD.  Pt has lost 9% wt in 4 months which is clinically significant. Pt previously 69.4 kg in 07/2023 and now weighs 63.5 kg. Physical exam shows severe muscle and severe fat depletions, indicative of malnutrition which is suspected to be chronic.   Pt reports he does not use any assistive devices for mobility but stated mobility became more difficult as his breathing worsened.   Discussed continuing Ensure while admitted, pt agreeable. Discussed liberalizing diet to carb mod to provide more options for pt while still monitoring carb intake to help manage blood sugars. Discussed adding link to coupons for Ensure to AVS, son and pt appreciated. Discussed high calorie, high protein nutrition therapy and added handout to AVS.  Average Meal Completion: 8/20-8/21: 88% average intake x 2 recorded meals  Medications reviewed and include:  Novolog  SSI 4x/daily Novolog  6 units with meals Lantus  25 units Ferrous sulfate  325 mg Zithromax   Labs reviewed:  CBG x 24 hr: 88-311 mg/dL BUN 56/ Creatinine 6.83 Magnesium  2.5 A1c 9.9 (06/2023)  NUTRITION - FOCUSED PHYSICAL EXAM:  Flowsheet Row Most Recent Value  Orbital Region Moderate depletion  Upper Arm Region Severe depletion  Thoracic and Lumbar Region Severe depletion  Buccal Region Moderate depletion  Temple Region Severe depletion  Clavicle Bone Region Severe depletion  Clavicle and Acromion Bone Region Severe depletion  Scapular Bone Region Severe depletion  Dorsal Hand Severe depletion  Patellar Region Severe depletion  Anterior Thigh Region Severe depletion  Posterior Calf Region Severe depletion  Edema (RD Assessment) None  Hair Reviewed  Eyes Reviewed  Mouth Reviewed  [no teeth, uses  dentures]  Skin Reviewed  Nails Reviewed    Diet Order:   Diet Order             Diet Carb Modified Fluid consistency: Thin; Room service appropriate? Yes with Assist  Diet effective now                   EDUCATION NEEDS:   Education needs have been addressed  Skin:  Skin Assessment: Reviewed RN Assessment  Last BM:  8/19  Height:   Ht Readings from Last 1 Encounters:  11/29/23 5' 10 (1.778 m)    Weight:   Wt Readings from Last 1 Encounters:  11/29/23 63.5 kg    Ideal Body Weight:  75.5 kg  BMI:  Body mass index is 20.09 kg/m.  Estimated Nutritional Needs:   Kcal:  1800-2000  Protein:  80-100g  Fluid:  1.8-2L    Josette Glance, MS, RDN, LDN Clinical Dietitian I Please reach out via secure chat

## 2023-12-01 NOTE — Discharge Instructions (Addendum)
Nutrition Post Hospital Stay Proper nutrition can help your body recover from illness and injury.   Foods and beverages high in protein, vitamins, and minerals help rebuild muscle loss, promote healing, & reduce fall risk.   In addition to eating healthy foods, a nutrition shake is an easy, delicious way to get the nutrition you need during and after your hospital stay  It is recommended that you continue to drink 2 bottles per day of: Ensure for at least 1 month (30 days) after your hospital stay   Tips for adding a nutrition shake into your routine: As allowed, drink one with vitamins or medications instead of water or juice Enjoy one as a tasty mid-morning or afternoon snack Drink cold or make a milkshake out of it Drink one instead of milk with cereal or snacks Use as a coffee creamer   Available at the following grocery stores and pharmacies:           * [employer] * [employer] * [employer]  * [employer]          * [employer] * [employer]  * [facility]      * Target  * [employer]   * [facility]  * [employer]   * [facility] [phone]            For COUPONS visit: [email or web address]/join or [email or web address]   Suggested Substitutions Ensure Plus = Boost Plus = Carnation Breakfast Essentials = Boost Compact Ensure Active Clear = Boost Breeze Glucerna Shake = Boost Glucose Control = Carnation Breakfast Essentials SUGAR FREE     High-Calorie, High-Protein Nutrition Therapy (2021)  A high-calorie, high-protein diet has been recommended to you. Your registered dietitian nutritionist (RDN) may have recommended this diet because you are having difficulty eating enough calories throughout the day, you have lost weight, and/or you need to add protein to your diet. Sometimes you may not feel like eating, even if you know the importance of good nutrition. The recommendations in this handout can help you with the following: Regaining your strength and energy Keeping your body  healthy Healing and recovering from surgery or illness and fighting infection  Tips: Schedule Your Meals and Snacks Several small meals and snacks are often better tolerated and digested than large meals. Strategies Plan to eat 3 meals and 3 snacks daily. Experiment with timing meals to find out when you have a larger appetite. Appetite may be greatest in the morning after not eating all night so you may prefer to eat your larger meals and snacks in the morning and at lunch. Breakfast-type foods are often better tolerated so eat foods such as eggs, pancakes, waffles and cereal for any meal or snack. Carry snacks with you so you are prepared to eat every 2 to 3 hours. Determine what works best for you if your body's cues for feeling hungry or full are not working. Eat a small meal or snack even if you don't feel hungry. Set a timer to remind you when it is time to eat. Take a walk before you eat (with health care provider's approval). Light or moderate physical activity can help you maintain muscle and increase your appetite.  Make Eating Enjoyable Taking steps to make the experience enjoyable may help to increase your interest in eating and improve your appetite. Strategies: Eat with others whenever possible. Include your favorite foods to make meals more enjoyable. Try new foods. Save your beverage for the end of the meal so that   you have more room for food before you get full.  Add Calories to Your Meals and Snacks Try adding calorie-dense foods so that each bite provides more nutrition. Strategies Drink milk, chocolate milk, soy milk, or smoothies instead of low-calorie beverages such as diet drinks or water. Cook with milk or soy milk instead of water when making dishes such as hot cereal, cocoa, or pudding. Add jelly, jam, honey, butter or margarine to bread and crackers. Add jam or fruit to ice cream and as a topping over cake. Mix dried fruit, nuts, granola, honey, or dry  cereal with yogurt or hot cereals. Enjoy snacks such as milkshakes, smoothies, pudding, ice cream, or custard. Blend a fruit smoothie of a banana, frozen berries, milk or soy milk, and 1 tablespoon nonfat powdered milk or protein powder.  Add Protein to Your Meals and Snacks Choose at least one protein food at each meal and snack to increase your daily intake. Strategies Add  cup nonfat dry milk powder or protein powder to make a high-protein milk to drink or to use in recipes that call for milk. Vanilla or peppermint extract or unsweetened cocoa powder could help to boost the flavor. Add hard-cooked eggs, leftover meat, grated cheese, canned beans or tofu to noodles, rice, salads, sandwiches, soups, casseroles, pasta, tuna and other mixed dishes. Add powdered milk or protein powder to hot cereals, meatloaf, casseroles, scrambled eggs, sauces, cream soups, and shakes. Add beans and lentils to salads, soups, casseroles, and vegetable dishes. Eat cottage cheese or yogurt, especially Greek yogurt, with fruit as a snack or dessert. Eat peanut or other nut butters on crackers, bread, toast, waffles, apples, bananas or celery sticks. Add it to milkshakes, smoothies, or desserts. Consider a ready-made protein shake. Your RDN will make recommendations.  Add Fats to Your Meals and Snacks Try adding fats to your meals and snacks. Fat provides more calories in fewer bites than carbohydrate or protein and adds flavors to your foods. Strategies Snack on nuts and seeds or add them to foods like salads, pasta, cereals, yogurt, and ice cream.  Saut or stir-fry vegetables, meats, chicken, fish or tofu in olive or canola oil.  Add olive oil, other vegetable oils, butter or margarine to soups, vegetables, potatoes, cooked cereal, rice, pasta, bread, crackers, pancakes, or waffles. Snack on olives or add to pasta, pizza, or salad. Add avocado or guacamole to your salads, sandwiches, and other entrees. Include  fatty fish such as salmon in your weekly meal plan. For general food safety tips, especially for clients with immunocompromised conditions, ask your RDN for the Food Safety Nutrition Therapy handout.  Small Meal and Snack Ideas These snacks and meals are recommended when you have to eat but aren't necessarily hungry.  They are good choices because they are high in protein and high in calories.  2 graham crackers 2 tablespoons peanut or other nut butter 1 cup milk 2 slices whole wheat toast topped with:  avocado, mashed Seasoning of your choice   cup Greek yogurt  cup fruit  cup granola 2 deviled egg halves 5 whole wheat crackers  1 cup cream of tomato soup  grilled cheese sandwich 1 toasted waffle topped with: 2 tablespoons peanut or nut butter 1 tablespoon jam  Trail mix made with:  cup nuts  cup dried fruit  cup cold cereal, any variety  cup oatmeal or cream of wheat cereal 1 tablespoon peanut or nut butter  cup diced fruit   High-Calorie, High-Protein Sample 1-Day Menu   View Nutrient Info Breakfast 1 egg, scrambled 1 ounce cheddar cheese 1 English muffin, whole wheat 1 tablespoon margarine 1 tablespoon jam  cup orange juice, fortified with calcium and vitamin D  Morning Snack 1 tablespoon peanut butter 1 banana 1 cup 1% milk  Lunch Tuna salad sandwich made with: 2 slices bread, whole wheat 3 ounces tuna mixed with: 1 tablespoon mayonnaise  cup pudding  Afternoon Snack  cup hummus  cup carrots 1 pita  Evening Meal Enchilada casserole made with: 2 corn tortillas 3 ounces ground beef, cooked  cup black beans, cooked  cup corn, cooked 1 ounce grated cheddar cheese  cup enchilada sauce  avocado, sliced, topping for enchilada 1 tablespoon sour cream, topping for enchilada Salad:  cup lettuce, shredded  cup tomatoes, chopped, for salad 1 tablespoon olive oil and vinegar dressing, for salad  Evening Snack  cup Greek yogurt  cup blueberries  cup  granola   

## 2023-12-02 ENCOUNTER — Other Ambulatory Visit (HOSPITAL_COMMUNITY): Payer: Self-pay

## 2023-12-02 ENCOUNTER — Inpatient Hospital Stay (HOSPITAL_COMMUNITY)

## 2023-12-02 DIAGNOSIS — J441 Chronic obstructive pulmonary disease with (acute) exacerbation: Secondary | ICD-10-CM | POA: Diagnosis not present

## 2023-12-02 LAB — CBC WITH DIFFERENTIAL/PLATELET
Abs Immature Granulocytes: 0.08 K/uL — ABNORMAL HIGH (ref 0.00–0.07)
Basophils Absolute: 0 K/uL (ref 0.0–0.1)
Basophils Relative: 0 %
Eosinophils Absolute: 0 K/uL (ref 0.0–0.5)
Eosinophils Relative: 0 %
HCT: 26.6 % — ABNORMAL LOW (ref 39.0–52.0)
Hemoglobin: 8.9 g/dL — ABNORMAL LOW (ref 13.0–17.0)
Immature Granulocytes: 1 %
Lymphocytes Relative: 7 %
Lymphs Abs: 1 K/uL (ref 0.7–4.0)
MCH: 31.6 pg (ref 26.0–34.0)
MCHC: 33.5 g/dL (ref 30.0–36.0)
MCV: 94.3 fL (ref 80.0–100.0)
Monocytes Absolute: 1 K/uL (ref 0.1–1.0)
Monocytes Relative: 8 %
Neutro Abs: 10.9 K/uL — ABNORMAL HIGH (ref 1.7–7.7)
Neutrophils Relative %: 84 %
Platelets: 381 K/uL (ref 150–400)
RBC: 2.82 MIL/uL — ABNORMAL LOW (ref 4.22–5.81)
RDW: 14.3 % (ref 11.5–15.5)
WBC: 12.9 K/uL — ABNORMAL HIGH (ref 4.0–10.5)
nRBC: 0 % (ref 0.0–0.2)

## 2023-12-02 LAB — COMPREHENSIVE METABOLIC PANEL WITH GFR
ALT: 17 U/L (ref 0–44)
AST: 15 U/L (ref 15–41)
Albumin: 1.9 g/dL — ABNORMAL LOW (ref 3.5–5.0)
Alkaline Phosphatase: 114 U/L (ref 38–126)
Anion gap: 15 (ref 5–15)
BUN: 67 mg/dL — ABNORMAL HIGH (ref 8–23)
CO2: 20 mmol/L — ABNORMAL LOW (ref 22–32)
Calcium: 9 mg/dL (ref 8.9–10.3)
Chloride: 113 mmol/L — ABNORMAL HIGH (ref 98–111)
Creatinine, Ser: 3.12 mg/dL — ABNORMAL HIGH (ref 0.61–1.24)
GFR, Estimated: 19 mL/min — ABNORMAL LOW (ref >60)
Glucose, Bld: 216 mg/dL — ABNORMAL HIGH (ref 70–99)
Potassium: 4.4 mmol/L (ref 3.5–5.1)
Sodium: 139 mmol/L (ref 135–145)
Total Bilirubin: 0.3 mg/dL (ref 0.0–1.2)
Total Protein: 5.4 g/dL — ABNORMAL LOW (ref 6.5–8.1)

## 2023-12-02 LAB — GLUCOSE, CAPILLARY
Glucose-Capillary: 234 mg/dL — ABNORMAL HIGH (ref 70–99)
Glucose-Capillary: 215 mg/dL — ABNORMAL HIGH (ref 70–99)

## 2023-12-02 LAB — PHOSPHORUS: Phosphorus: 3.4 mg/dL (ref 2.5–4.6)

## 2023-12-02 LAB — HEMOGLOBIN A1C
Hgb A1c MFr Bld: 11.2 % — ABNORMAL HIGH (ref 4.8–5.6)
Mean Plasma Glucose: 274.74 mg/dL

## 2023-12-02 LAB — MAGNESIUM: Magnesium: 2.4 mg/dL (ref 1.7–2.4)

## 2023-12-02 MED ORDER — APIXABAN 5 MG PO TABS: 5.0000 mg | ORAL_TABLET | Freq: Two times a day (BID) | ORAL | 1 refills | Status: AC

## 2023-12-02 MED ORDER — INSULIN DEGLUDEC 100 UNIT/ML ~~LOC~~ SOPN: 25.0000 [IU] | PEN_INJECTOR | Freq: Every day | SUBCUTANEOUS | Status: AC

## 2023-12-02 MED ORDER — APIXABAN (ELIQUIS) VTE STARTER PACK (10MG AND 5MG)
ORAL_TABLET | ORAL | 0 refills | Status: DC
  Filled 2023-12-02: qty 74, 30d supply, fill #0

## 2023-12-02 MED ORDER — AZITHROMYCIN 250 MG PO TABS
500.0000 mg | ORAL_TABLET | Freq: Once | ORAL | 0 refills | Status: AC
  Filled 2023-12-02: qty 2, 1d supply, fill #0

## 2023-12-02 MED ORDER — INSULIN GLARGINE 100 UNIT/ML ~~LOC~~ SOLN
30.0000 [IU] | Freq: Every day | SUBCUTANEOUS | Status: DC
  Administered 2023-12-02: 30 [IU] via SUBCUTANEOUS
  Filled 2023-12-02: qty 0.3

## 2023-12-02 MED ORDER — FERROUS SULFATE 325 (65 FE) MG PO TABS
325.0000 mg | ORAL_TABLET | ORAL | 0 refills | Status: DC
  Filled 2023-12-02: qty 15, 30d supply, fill #0

## 2023-12-02 MED ORDER — AZITHROMYCIN 250 MG PO TABS
500.0000 mg | ORAL_TABLET | Freq: Once | ORAL | Status: AC
Start: 2023-12-02 — End: 2023-12-02
  Administered 2023-12-02: 500 mg via ORAL

## 2023-12-02 MED ORDER — ALBUTEROL SULFATE HFA 108 (90 BASE) MCG/ACT IN AERS
1.0000 | INHALATION_SPRAY | Freq: Four times a day (QID) | RESPIRATORY_TRACT | 1 refills | Status: AC | PRN
  Filled 2023-12-02: qty 6.7, 30d supply, fill #0

## 2023-12-02 MED ORDER — TRELEGY ELLIPTA 200-62.5-25 MCG/ACT IN AEPB
1.0000 | INHALATION_SPRAY | Freq: Every day | RESPIRATORY_TRACT | 11 refills | Status: DC
  Filled 2023-12-02: qty 60, 30d supply, fill #0

## 2023-12-02 MED ORDER — INSULIN LISPRO (1 UNIT DIAL) 100 UNIT/ML (KWIKPEN)
5.0000 [IU] | PEN_INJECTOR | Freq: Three times a day (TID) | SUBCUTANEOUS | 1 refills | Status: DC
  Filled 2023-12-02: qty 15, 100d supply, fill #0

## 2023-12-02 MED ORDER — PREDNISONE 20 MG PO TABS
40.0000 mg | ORAL_TABLET | Freq: Every day | ORAL | 0 refills | Status: AC
  Filled 2023-12-02: qty 2, 1d supply, fill #0

## 2023-12-02 NOTE — Discharge Summary (Signed)
 Physician Discharge Summary  [name] FMW:[id number] DOB: 1940-05-11 DOA: 11/29/2023  PCP: Sheldon Netter, PA  Admit date: 11/29/2023 Discharge date: 12/02/2023  Time spent: 40 minutes  Recommendations for Outpatient Follow-up:  Follow outpatient CBC/CMP  Follow renal function outpatient Follow COPD outpatient Follow anticoagulation with PCP outpatient - discuss long term anticoagulation plan - could consider repeat VQ scan or heme follow up after 3-6 months anticoagulation to help with decision making Follow diabetes management outpatient   Discharge Diagnoses:  Principal Problem:   COPD with acute exacerbation (HCC) Active Problems:   Acute kidney injury superimposed on chronic kidney disease (HCC)   Essential hypertension   Insulin  dependent type 2 diabetes mellitus (HCC)   Hyperlipidemia   History of lung cancer   Continuous dependence on cigarette smoking   History of BPH   Chronic anemia   Chronic hypoxic respiratory failure (HCC)   Pulmonary embolism (HCC)   Discharge Condition: stable  Diet recommendation: heart healthy  Filed Weights   11/29/23 1344  Weight: 63.5 kg    History of present illness:   Oluwatimileyin Vivier. is Rochanda Harpham 83 y.o. male with medical history significant of chronic hypoxic respiratory failure, COPD, CKD 3B/4, essential hypertension, insulin -dependent DM type II, history of lung cancer status postradiation, gout, BPH, chronic normocytic anemia and hyperlipidemia presenting to emergency department complaining of shortness of breath for last 3 days.   He's been on 1 L since April.  Recently increased to 2 L with his acute SOB.   He was diagnosed with Zoria Rawlinson COPD exacerbation.  VQ scan showed findings concerning for PE and he was started on anticoagulation.  He's improved on 8/22 and is stable for discharge.  See below for additional details.   Hospital Course:  Assessment and Plan:  Acute on Chronic Hypoxic Respiratory Failure History of  Lung cancer s/p Radiation Therapy  Typically on 1-2 L Walked on RA today without desat CXR without acute findings Due to COPD exacerbation and PE   Acute Pulmonary Embolism Elevated d dimer, VQ scan with abnormal perfusion scan highly concerning for PE Decreased and heterogenous perfusion of entire left lung and right upper lobe with multiple defects  Unable to get CT PE protocol due to his CKD He's transitioned to eliquis .  Plan for at least 3-6 months of anticoagulation.  Needs discussion with PCP outpatient regarding risks and benefits of long term anticoagulation and whether this is indicated for him (consider heme).   Follow echo (preserved EF, normal RVSF) and LE US  (negative for DVT)   Acute COPD Exacerbation Follow outpatient, complete course of steroids Will refill albuterol  and trelegy   AKI on CKD stage IV Very mild AKI when compared to baseline creatinine of 2.7 Fluctuating, 3.12 today, will monitor outpatient   T2DM with Hyperglycemia Continue basal/bolus and SSI  At home he was on tresiba  + TID 75/25 -> we increased tresiba  to 25 units and started him on lispro 5 units with meals Follow BG's outpatient    History of BPH -Continue Proscar    Chronic anemia -mild downtrend, will trend --anemia labs  - labs c/w aocd/iron def - normal folate, elevated b12   Essential hypertension -Continue amlodipine     Procedures: LE US  Summary:  BILATERAL:  - No evidence of deep vein thrombosis seen in the lower extremities,  bilaterally.  -No evidence of popliteal cyst, bilaterally.    LEFT:      - Ultrasound characteristics of enlarged lymph nodes noted in the groin.  Echo IMPRESSIONS     1. Left ventricular ejection fraction, by estimation, is 60 to 65%. The  left ventricle has normal function. The left ventricle has no regional  wall motion abnormalities. There is mild concentric left ventricular  hypertrophy. Left ventricular diastolic  parameters are  consistent with Grade I diastolic dysfunction (impaired  relaxation).   2. Right ventricular systolic function is normal. The right ventricular  size is normal.   3. The mitral valve is normal in structure. Trivial mitral valve  regurgitation. No evidence of mitral stenosis.   4. The aortic valve is tricuspid. There is moderate calcification of the  aortic valve. Aortic valve regurgitation is not visualized. Aortic valve  sclerosis/calcification is present, without any evidence of aortic  stenosis.   5. The inferior vena cava is normal in size with greater than 50%  respiratory variability, suggesting right atrial pressure of 3 mmHg.   Consultations: none  Discharge Exam: Vitals:   12/02/23 0857 12/02/23 1454  BP:  (!) 135/55  Pulse: 75 63  Resp: (!) 21 16  Temp:  98.1 F (36.7 C)  SpO2: 97% 100%   No new complaints Discussed d/c plan with patient and son, Buren  General: No acute distress. Cardiovascular: RRR Lungs: coarse breath sounds bilaterally, no wheezing Abdomen: Soft, nontender, nondistended  Neurological: Alert and oriented 3. Moves all extremities 4 with equal strength. Cranial nerves II through XII grossly intact. Extremities: No clubbing or cyanosis. No edema.   Discharge Instructions   Discharge Instructions     Call MD for:  difficulty breathing, headache or visual disturbances   Complete by: As directed    Call MD for:  extreme fatigue   Complete by: As directed    Call MD for:  hives   Complete by: As directed    Call MD for:  persistant dizziness or light-headedness   Complete by: As directed    Call MD for:  persistant nausea and vomiting   Complete by: As directed    Call MD for:  redness, tenderness, or signs of infection (pain, swelling, redness, odor or green/yellow discharge around incision site)   Complete by: As directed    Call MD for:  severe uncontrolled pain   Complete by: As directed    Call MD for:  temperature >100.4   Complete  by: As directed    Diet - low sodium heart healthy   Complete by: As directed    Discharge instructions   Complete by: As directed    You were seen for shortness of breath.  You were diagnosed with blood clots in the lungs based on your VQ scan.  We could not get Kathyjo Briere CT scan with contrast due to your kidney function.  Your leg ultrasound and your echo of your heart were reassuring.  We'll discharge you on eliquis  for your blood clots.  You'll take this for at least 3-6 months.  After this, you should have Perlie Stene discussion with your PCP regarding the risks and benefits of continuing anticoagulation long term.  I'll give you paper prescription to fill after you finish the starter pack that you'll go home with .  You have another dose of steroids and antibiotics for your COPD exacerbation.  You'll finish this tomorrow.  I'll refill your albuterol  inhaler and your controller medicine the trelegy.   I've adjusted your insulin .  Your A1c is over 11.  Use 25 units of tresiba  Eartha Vonbehren day.  In addition to that, I'll send you  with Towanna Avery new short acting insulin  called humalog .  Take 5 units of humalog  with meals.  I expect your blood sugars will improve as the steroids are discontinued so watch for low blood sugars. Follow with your PCP outpatient for further adjustments to your regimen as needed.    Return for new, recurrent, or worsening symptoms.  Please ask your PCP to request records from this hospitalization so they know what was done and what the next steps will be.   Increase activity slowly   Complete by: As directed       Allergies as of 12/02/2023       Reactions   Chantix  Continuing Month [varenicline ] Other (See Comments)   Doesn't work for patient   Januvia [sitagliptin] Other (See Comments)   Class Contraindicated   Lisinopril Swelling   Possible tongue swelling. ARB's okay   Motrin [ibuprofen] Other (See Comments)   Contraindicated by Renal disease   Remeron [mirtazapine] Other (See Comments)    Excess somnolence on the 15 mg dose   Penicillins Other (See Comments)   Facial swelling        Medication List     STOP taking these medications    aspirin  EC 81 MG tablet   HumaLOG  Mix 75/25 KwikPen (75-25) 100 UNIT/ML KwikPen Generic drug: Insulin  Lispro Prot & Lispro   ipratropium-albuterol  0.5-2.5 (3) MG/3ML Soln Commonly known as: DUONEB   Varenicline  Tartrate (Starter) 0.5 MG X 11 & 1 MG X 42 Tbpk       TAKE these medications    albuterol  108 (90 Base) MCG/ACT inhaler Commonly known as: VENTOLIN  HFA Inhale 1-2 puffs into the lungs every 6 (six) hours as needed for wheezing or shortness of breath. What changed:  how much to take when to take this Another medication with the same name was removed. Continue taking this medication, and follow the directions you see here.   allopurinol 100 MG tablet Commonly known as: ZYLOPRIM Take 100 mg by mouth every morning.   amLODipine  10 MG tablet Commonly known as: NORVASC  Take 5 mg by mouth in the morning and at bedtime.   Apixaban  Starter Pack (10mg  and 5mg ) Commonly known as: ELIQUIS  STARTER PACK Take as directed on package: start with two-5mg  tablets twice daily for 7 days. On day 8, switch to one-5mg  tablet twice daily.   apixaban  5 MG Tabs tablet Commonly known as: ELIQUIS  Take 1 tablet (5 mg total) by mouth 2 (two) times daily. Start this once the eliquis  starter pack is complete. Start taking on: January 02, 2024   azithromycin  250 MG tablet Commonly known as: ZITHROMAX  Take 2 tablets (500 mg total) by mouth once for 1 dose. Start taking on: December 03, 2023   Farxiga 10 MG Tabs tablet Generic drug: dapagliflozin propanediol Take 1 tablet by mouth daily.   ferrous sulfate  325 (65 FE) MG tablet Take 1 tablet (325 mg total) by mouth every other day. Start taking on: December 03, 2023   finasteride  5 MG tablet Commonly known as: PROSCAR  Take 5 mg by mouth daily.   Fish Oil 1200 MG Caps Take 1,200  mg by mouth daily.   FreeStyle Libre 3 Plus Sensor Misc Use as directed and change every 14 (fourteen) days.   insulin  degludec 100 UNIT/ML FlexTouch Pen Commonly known as: TRESIBA  Inject 25 Units into the skin daily. What changed: how much to take   insulin  lispro 100 UNIT/ML KwikPen Commonly known as: HUMALOG  Inject 5 Units into the skin 3 (  three) times daily with meals.   multivitamin with minerals tablet Take 1 tablet by mouth daily.   predniSONE  20 MG tablet Commonly known as: DELTASONE  Take 2 tablets (40 mg total) by mouth daily with breakfast for 1 day. Start taking on: December 03, 2023   simvastatin  10 MG tablet Commonly known as: ZOCOR  Take 10 mg by mouth at bedtime.   tamsulosin  0.4 MG Caps capsule Commonly known as: FLOMAX  Take 0.8 mg by mouth at bedtime.   Trelegy Ellipta  200-62.5-25 MCG/ACT Aepb Generic drug: Fluticasone -Umeclidin-Vilant Inhale 1 puff into the lungs daily.   valsartan-hydrochlorothiazide  320-25 MG tablet Commonly known as: DIOVAN-HCT Take 1 tablet by mouth daily.   Vitamin B-12 CR 1500 MCG Tbcr Take 1,500 mcg by mouth daily.       Allergies  Allergen Reactions   Chantix  Continuing Month [Varenicline ] Other (See Comments)    Doesn't work for patient   Januvia [Sitagliptin] Other (See Comments)    Class Contraindicated   Lisinopril Swelling    Possible tongue swelling. ARB's okay   Motrin [Ibuprofen] Other (See Comments)    Contraindicated by Renal disease   Remeron [Mirtazapine] Other (See Comments)    Excess somnolence on the 15 mg dose   Penicillins Other (See Comments)    Facial swelling    Follow-up Information     Sheldon Netter, PA Follow up.   Specialty: Physician Assistant Contact information: [address] [place] [address] [place] [address] [phone]                  The results of significant diagnostics from this hospitalization (including imaging, microbiology, ancillary and laboratory) are  listed below for reference.    Significant Diagnostic Studies: DG CHEST PORT 1 VIEW Result Date: 12/02/2023 CLINICAL DATA:  Cough. EXAM: PORTABLE CHEST 1 VIEW COMPARISON:  Radiograph 11/29/2023.  CT 09/09/2020 FINDINGS: The lungs are hyperinflated with emphysema. Stable right apical density. Subsegmental scarring in the left mid lung. Chronic blunting of costophrenic angles likely due to hyperinflation. No acute airspace disease, large pleural effusion or pneumothorax. The heart is normal in size with stable mediastinal contours. IMPRESSION: Hyperinflation and emphysema. No acute findings. Electronically Signed   By: Andrea Gasman M.D.   On: 12/02/2023 13:38   ECHOCARDIOGRAM COMPLETE Result Date: 12/01/2023    ECHOCARDIOGRAM REPORT   Patient Name:   Izaak Sahr. Date of Exam: 11/30/2023 Medical Rec #:  [id number]           Height:       70.0 in Accession #:    [id number]          Weight:       140.0 lb Date of Birth:  August 08, 1940            BSA:          1.794 m Patient Age:    83 years            BP:           144/41 mmHg Patient Gender: M                   HR:           70 bpm. Exam Location:  Inpatient Procedure: 2D Echo (Both Spectral and Color Flow Doppler were utilized during            procedure). Indications:    Shortness of breath  History:        Patient has prior  history of Echocardiogram examinations, most                 recent 07/02/2023. COPD, Signs/Symptoms:Chest Pain and Shortness                 of Breath; Risk Factors:Diabetes, Current Smoker and                 Hypertension. Lung cancer.  Sonographer:    Juliene Rucks Referring Phys: [phone] Hancel Ion CALDWELL POWELL JR IMPRESSIONS  1. Left ventricular ejection fraction, by estimation, is 60 to 65%. The left ventricle has normal function. The left ventricle has no regional wall motion abnormalities. There is mild concentric left ventricular hypertrophy. Left ventricular diastolic parameters are consistent with Grade I diastolic dysfunction  (impaired relaxation).  2. Right ventricular systolic function is normal. The right ventricular size is normal.  3. The mitral valve is normal in structure. Trivial mitral valve regurgitation. No evidence of mitral stenosis.  4. The aortic valve is tricuspid. There is moderate calcification of the aortic valve. Aortic valve regurgitation is not visualized. Aortic valve sclerosis/calcification is present, without any evidence of aortic stenosis.  5. The inferior vena cava is normal in size with greater than 50% respiratory variability, suggesting right atrial pressure of 3 mmHg. FINDINGS  Left Ventricle: Left ventricular ejection fraction, by estimation, is 60 to 65%. The left ventricle has normal function. The left ventricle has no regional wall motion abnormalities. The left ventricular internal cavity size was normal in size. There is  mild concentric left ventricular hypertrophy. Left ventricular diastolic parameters are consistent with Grade I diastolic dysfunction (impaired relaxation). Right Ventricle: The right ventricular size is normal. No increase in right ventricular wall thickness. Right ventricular systolic function is normal. Left Atrium: Left atrial size was normal in size. Right Atrium: Right atrial size was normal in size. Pericardium: There is no evidence of pericardial effusion. Mitral Valve: The mitral valve is normal in structure. Mild mitral annular calcification. Trivial mitral valve regurgitation. No evidence of mitral valve stenosis. Tricuspid Valve: The tricuspid valve is normal in structure. Tricuspid valve regurgitation is trivial. No evidence of tricuspid stenosis. Aortic Valve: The aortic valve is tricuspid. There is moderate calcification of the aortic valve. Aortic valve regurgitation is not visualized. Aortic valve sclerosis/calcification is present, without any evidence of aortic stenosis. Pulmonic Valve: The pulmonic valve was normal in structure. Pulmonic valve regurgitation is  trivial. No evidence of pulmonic stenosis. Aorta: The aortic root is normal in size and structure. Venous: The inferior vena cava is normal in size with greater than 50% respiratory variability, suggesting right atrial pressure of 3 mmHg. IAS/Shunts: No atrial level shunt detected by color flow Doppler.  LEFT VENTRICLE PLAX 2D LVIDd:         4.30 cm      Diastology LVIDs:         3.00 cm      LV e' medial:    5.44 cm/s LV PW:         1.30 cm      LV E/e' medial:  17.3 LV IVS:        1.00 cm      LV e' lateral:   9.14 cm/s LVOT diam:     1.90 cm      LV E/e' lateral: 10.3 LV SV:         78 LV SV Index:   43 LVOT Area:     2.84 cm  LV Volumes (MOD)  LV vol d, MOD A2C: 127.0 ml LV vol d, MOD A4C: 159.0 ml LV vol s, MOD A2C: 59.1 ml LV vol s, MOD A4C: 67.8 ml LV SV MOD A2C:     67.9 ml LV SV MOD A4C:     159.0 ml LV SV MOD BP:      77.7 ml RIGHT VENTRICLE             IVC RV Basal diam:  3.70 cm     IVC diam: 1.60 cm RV Mid diam:    3.10 cm RV S prime:     13.10 cm/s TAPSE (M-mode): 1.8 cm LEFT ATRIUM           Index        RIGHT ATRIUM           Index LA diam:      4.10 cm 2.29 cm/m   RA Area:     19.60 cm LA Vol (A2C): 57.0 ml 31.78 ml/m  RA Volume:   57.20 ml  31.89 ml/m LA Vol (A4C): 29.0 ml 16.17 ml/m  AORTIC VALVE LVOT Vmax:   112.00 cm/s LVOT Vmean:  77.100 cm/s LVOT VTI:    0.274 m  AORTA Ao Root diam: 3.30 cm Ao Asc diam:  3.30 cm MITRAL VALVE MV Area (PHT): 3.17 cm     SHUNTS MV Decel Time: 239 msec     Systemic VTI:  0.27 m MV E velocity: 93.90 cm/s   Systemic Diam: 1.90 cm MV Indy Prestwood velocity: 105.00 cm/s MV E/Stanislaw Acton ratio:  0.89 Toribio Fuel MD Electronically signed by Toribio Fuel MD Signature Date/Time: 12/01/2023/11:50:46 AM    Final    VAS US  LOWER EXTREMITY VENOUS (DVT) Result Date: 11/30/2023  Lower Venous DVT Study Patient Name:  Jabree Rebert.  Date of Exam:   11/30/2023 Medical Rec #: [id number]            Accession #:    [id number] Date of Birth: 01/20/1941             Patient Gender: M  Patient Age:   64 years Exam Location:  [facility] Procedure:      VAS US  LOWER EXTREMITY VENOUS (DVT) Referring Phys: Bridgitte Felicetti POWELL JR --------------------------------------------------------------------------------  Indications: SOB, and possible PE seen on VQ scan.  Comparison Study: Previous exam on 07/03/2023 was negative for DVT Performing Technologist: Ezzie Potters RVT, RDMS  Examination Guidelines: Glenda Kunst complete evaluation includes B-mode imaging, spectral Doppler, color Doppler, and power Doppler as needed of all accessible portions of each vessel. Bilateral testing is considered an integral part of Lanika Colgate complete examination. Limited examinations for reoccurring indications may be performed as noted. The reflux portion of the exam is performed with the patient in reverse Trendelenburg.  +---------+---------------+---------+-----------+----------+--------------+ RIGHT    CompressibilityPhasicitySpontaneityPropertiesThrombus Aging +---------+---------------+---------+-----------+----------+--------------+ CFV      Full           Yes      Yes                                 +---------+---------------+---------+-----------+----------+--------------+ SFJ      Full                                                        +---------+---------------+---------+-----------+----------+--------------+  FV Prox  Full           Yes      Yes                                 +---------+---------------+---------+-----------+----------+--------------+ FV Mid   Full           Yes      Yes                                 +---------+---------------+---------+-----------+----------+--------------+ FV DistalFull           Yes      Yes                                 +---------+---------------+---------+-----------+----------+--------------+ PFV      Full                                                        +---------+---------------+---------+-----------+----------+--------------+ POP       Full           Yes      Yes                                 +---------+---------------+---------+-----------+----------+--------------+ PTV      Full                                                        +---------+---------------+---------+-----------+----------+--------------+ PERO     Full                                                        +---------+---------------+---------+-----------+----------+--------------+   +---------+---------------+---------+-----------+----------+--------------+ LEFT     CompressibilityPhasicitySpontaneityPropertiesThrombus Aging +---------+---------------+---------+-----------+----------+--------------+ CFV      Full           Yes      Yes                                 +---------+---------------+---------+-----------+----------+--------------+ SFJ      Full                                                        +---------+---------------+---------+-----------+----------+--------------+ FV Prox  Full           Yes      Yes                                 +---------+---------------+---------+-----------+----------+--------------+ FV Mid   Full  Yes      Yes                                 +---------+---------------+---------+-----------+----------+--------------+ FV DistalFull           Yes      Yes                                 +---------+---------------+---------+-----------+----------+--------------+ PFV      Full                                                        +---------+---------------+---------+-----------+----------+--------------+ POP      Full           Yes      Yes                                 +---------+---------------+---------+-----------+----------+--------------+ PTV      Full                                                        +---------+---------------+---------+-----------+----------+--------------+ PERO     Full                                                         +---------+---------------+---------+-----------+----------+--------------+     Summary: BILATERAL: - No evidence of deep vein thrombosis seen in the lower extremities, bilaterally. -No evidence of popliteal cyst, bilaterally.  LEFT: - Ultrasound characteristics of enlarged lymph nodes noted in the groin.  *See table(s) above for measurements and observations. Electronically signed by Gaile New MD on 11/30/2023 at 10:46:15 PM.    Final    NM Pulmonary Perfusion Addendum Date: 11/30/2023 ADDENDUM REPORT: 11/30/2023 13:55 ADDENDUM: Critical Value/emergent results were called by telephone at the time of interpretation by Dr Duwaine Severs on November 30, 2023 at 10:55 Elloise Roark.m. to provider Dr. Starleen Monte who verbally acknowledged these results. Electronically Signed   By: Megan  Zare M.D.   On: 11/30/2023 13:55   Result Date: 11/30/2023 CLINICAL DATA:  Rule out pulmonary embolism. Right upper lobe malignancy EXAM: NUCLEAR MEDICINE PERFUSION LUNG SCAN TECHNIQUE: Perfusion images were obtained in multiple projections after intravenous injection of radiopharmaceutical. Ventilation scans intentionally deferred if perfusion scan and chest x-ray adequate for interpretation during COVID 19 epidemic. RADIOPHARMACEUTICALS:  4.4 mCi Tc-61m MAA IV COMPARISON:  Chest radiograph November 29, 2023 and multiple radiographs and PET-CT dated back to 2022. FINDINGS: There is Jeaneen Cala moderate to large perfusion defect involving the right upper lobe correlating to site of known malignancy. There is Khaila Velarde matched increased opacity in right lung apex in this region on chest radiograph correlates to site of known malignancy. There is diffuse heterogeneous patchy decreased left lung perfusion highly concerning for pulmonary embolus. No significant consolidation in left lung on radiograph. Exam  is suboptimal given the lack of ventilation scan. Chest radiograph is used for comparison. IMPRESSION: Abnormal perfusion scan highly concerning  for pulmonary embolism. Significant decreased and heterogeneous perfusion of the entire left lung and right upper lobe with multiple defects. Recommend dedicated CT angiogram for further assessment. (Please note right upper lobe perfusion defect also representing matched abnormality on radiograph, corresponds to site of known malignancy). Electronically Signed: By: Megan  Zare M.D. On: 11/30/2023 13:41   DG Chest 2 View Result Date: 11/29/2023 CLINICAL DATA:  Shortness of breath. EXAM: CHEST - 2 VIEW COMPARISON:  07/05/2023. FINDINGS: Stable cardiomediastinal contours. Aortic atherosclerosis. Chronic background emphysema and scarring. Chronic blunting of the left costophrenic angle. No focal consolidation, pleural effusion, or pneumothorax. No acute osseous abnormality. IMPRESSION: No acute cardiopulmonary findings. Electronically Signed   By: Harrietta Sherry M.D.   On: 11/29/2023 14:11    Microbiology: Recent Results (from the past 240 hours)  Resp panel by RT-PCR (RSV, Flu Dhanya Bogle&B, Covid) Anterior Nasal Swab     Status: None   Collection Time: 11/29/23  8:43 PM   Specimen: Anterior Nasal Swab  Result Value Ref Range Status   SARS Coronavirus 2 by RT PCR NEGATIVE NEGATIVE Final   Influenza Wacey Zieger by PCR NEGATIVE NEGATIVE Final   Influenza B by PCR NEGATIVE NEGATIVE Final    Comment: (NOTE) The Xpert Xpress SARS-CoV-2/FLU/RSV plus assay is intended as an aid in the diagnosis of influenza from Nasopharyngeal swab specimens and should not be used as Haruna Rohlfs sole basis for treatment. Nasal washings and aspirates are unacceptable for Xpert Xpress SARS-CoV-2/FLU/RSV testing.  Fact Sheet for Patients: BloggerCourse.com  Fact Sheet for Healthcare Providers: SeriousBroker.it  This test is not yet approved or cleared by the [place]  FDA and has been authorized for detection and/or diagnosis of SARS-CoV-2 by FDA under an Emergency Use Authorization (EUA).  This EUA will remain in effect (meaning this test can be used) for the duration of the COVID-19 declaration under Section 564(b)(1) of the Act, 21 U.S.C. section 360bbb-3(b)(1), unless the authorization is terminated or revoked.     Resp Syncytial Virus by PCR NEGATIVE NEGATIVE Final    Comment: (NOTE) Fact Sheet for Patients: BloggerCourse.com  Fact Sheet for Healthcare Providers: SeriousBroker.it  This test is not yet approved or cleared by the [place]  FDA and has been authorized for detection and/or diagnosis of SARS-CoV-2 by FDA under an Emergency Use Authorization (EUA). This EUA will remain in effect (meaning this test can be used) for the duration of the COVID-19 declaration under Section 564(b)(1) of the Act, 21 U.S.C. section 360bbb-3(b)(1), unless the authorization is terminated or revoked.  Performed at [facility], [address] [place], [place] [address]      Labs: Basic Metabolic Panel: Recent Labs  Lab 11/29/23 1355 11/30/23 0422 12/01/23 0436 12/02/23 0329  NA 136 143 137 139  K 4.0 3.7 4.1 4.4  CL 107 113* 107 113*  CO2 22 19* 19* 20*  GLUCOSE 296* 158* 245* 216*  BUN 43* 43* 43* 67*  CREATININE 3.02* 2.88* 3.16* 3.12*  CALCIUM 8.5* 8.6* 8.6* 9.0  MG  --   --  2.5* 2.4  PHOS  --   --  3.7 3.4   Liver Function Tests: Recent Labs  Lab 11/30/23 0422 12/01/23 0436 12/02/23 0329  AST 12* 14* 15  ALT 15 17 17   ALKPHOS 118 116 114  BILITOT 0.7 0.6 0.3  PROT 5.2* 5.6* 5.4*  ALBUMIN  1.9* 1.9* 1.9*  No results for input(s): LIPASE, AMYLASE in the last 168 hours. No results for input(s): AMMONIA in the last 168 hours. CBC: Recent Labs  Lab 11/29/23 1355 11/30/23 0422 11/30/23 1817 12/01/23 0436 12/02/23 0329  WBC 9.5 9.1  --  8.0 12.9*  NEUTROABS  --   --   --   --  10.9*  HGB 10.1* 8.7* 9.8* 9.3* 8.9*  HCT 32.1* 26.5* 30.3* 28.4* 26.6*  MCV 97.3 96.4  --  95.6 94.3   PLT 347 321  --  357 381   Cardiac Enzymes: No results for input(s): CKTOTAL, CKMB, CKMBINDEX, TROPONINI in the last 168 hours. BNP: BNP (last 3 results) Recent Labs    06/28/23 1335 07/02/23 0259 11/29/23 1855  BNP 116.4* 190.9* 164.4*    ProBNP (last 3 results) No results for input(s): PROBNP in the last 8760 hours.  CBG: Recent Labs  Lab 12/01/23 1151 12/01/23 1638 12/01/23 1946 12/02/23 0639 12/02/23 1206  GLUCAP 369* 342* 269* 234* 215*       Signed:  Meliton Monte MD.  [facility] 12/02/2023, 4:14 PM

## 2023-12-02 NOTE — TOC Transition Note (Signed)
 Transition of Care [facility]) - Discharge Note   Patient Details  Name: Jeffrey Randall. MRN: [id number] Date of Birth: 05/19/1940  [facility] CM/SW Contact:  Corean JAYSON Canary, RN Phone Number: 12/02/2023, 5:14 PM   Clinical Narrative:    Met with patient and family at bedside, he does already have Home health [facility]. They are wanting to continue this. He declines need for any DME at this time. The patient is discharging home   Final next level of care: Home w Home Health Services Barriers to Discharge: No Barriers Identified   Patient Goals and CMS Choice            Discharge Placement                       Discharge Plan and Services Additional resources added to the After Visit Summary for     Discharge Planning Services: CM Consult                      HH Arranged: PT, OT HH Agency: [facility] Date [facility] Agency Contacted: 12/02/23 Time HH Agency Contacted: 1714 Representative spoke with at [facility] Agency: Amy  Social Drivers of Health (SDOH) Interventions SDOH Screenings   Food Insecurity: No Food Insecurity (11/29/2023)  Housing: Low Risk  (11/29/2023)  Transportation Needs: No Transportation Needs (11/29/2023)  Utilities: Not At Risk (11/29/2023)  Social Connections: Moderately Isolated (11/29/2023)  Tobacco Use: High Risk (11/29/2023)     Readmission Risk Interventions    07/07/2023    3:41 PM  Readmission Risk Prevention Plan  Transportation Screening Complete  Home Care Screening Complete  Medication Review (RN CM) Complete

## 2023-12-02 NOTE — Evaluation (Signed)
 Occupational Therapy Evaluation Patient Details Name: Jeffrey Randall. MRN: [id number] DOB: Apr 06, 1941 Today's Date: 12/02/2023   History of Present Illness   Jeffrey Randall is a 83 y.o. male admitted 11/29/23 for COPD exacerbation. VQ perfusion scan highly concerning for PE; decreased and heterogenous perfusion of entire left lung and right upper lobe with multiple defects. PMHx: chronic hypoxic respiratory failure, COPD, CKD 3B/4, essential HTN, T2DM, history of lung cancer s/p radiation, gout, BPH, chronic normocytic anemia and HLD. Pt is a current smoker.     Clinical Impressions At baseline, pt is Independent with ADLs, IADLs, and functional mobility without an AD and drives. Pt now presents with decreased activity tolerance, mildly decreased balance with no LOB noted, and decreased safety and independence with functional tasks. Pt currently demonstrates ability to complete ADLs largely Independent to Contact guard assist for safety and functional mobility/transfers without an AD with Supervision to Contact guard assist. Pt will benefit from acute skilled OT services to address deficits outlined below and to increase safety and independence with functional tasks, as well as improvement of pt overall management of chronic health conditions. OT recommends shower chair and Rollator for energy conservation; however, pt reports he is not interested in these at this time. No post acute skilled OT needs are anticipated.      If plan is discharge home, recommend the following:   A little help with walking and/or transfers;A little help with bathing/dressing/bathroom;Assistance with cooking/housework;Assist for transportation;Help with stairs or ramp for entrance     Functional Status Assessment   Patient has had a recent decline in their functional status and demonstrates the ability to make significant improvements in function in a reasonable and predictable amount of time.      Equipment Recommendations   Other (comment) (OT recommends shower chair and Rollator for energy conservation; however, pt not receptive to these during OT eval)     Recommendations for Other Services         Precautions/Restrictions   Precautions Precautions: Fall Recall of Precautions/Restrictions: Intact Restrictions Weight Bearing Restrictions Per Provider Order: No     Mobility Bed Mobility               General bed mobility comments: Pt sitting in recliner at beginning and end of session    Transfers Overall transfer level: Needs assistance Equipment used: None Transfers: Sit to/from Stand, Bed to chair/wheelchair/BSC Sit to Stand: Supervision     Step pivot transfers: Contact guard assist     General transfer comment: CGA for safety. Good eccentric control.      Balance Overall balance assessment: Mild deficits observed, not formally tested                                         ADL either performed or assessed with clinical judgement   ADL Overall ADL's : Needs assistance/impaired Eating/Feeding: Independent;Sitting   Grooming: Supervision/safety;Standing   Upper Body Bathing: Supervision/ safety;Set up;Sitting   Lower Body Bathing: Supervison/ safety;Minimal assistance;Sitting/lateral leans;Sit to/from stand   Upper Body Dressing : Independent;Sitting   Lower Body Dressing: Supervision/safety;Contact guard assist;Sitting/lateral leans;Sit to/from stand   Toilet Transfer: Contact guard assist;BSC/3in1 (step-pivot; no AD) Toilet Transfer Details (indicate cue type and reason): simulated at recliner Toileting- Clothing Manipulation and Hygiene: Supervision/safety;Sitting/lateral lean;Sit to/from stand       Functional mobility during ADLs: Contact guard assist (with no  AD) General ADL Comments: Supervision to [facility] for safety with no additional physical assist needed. Pt with decreased activity tolerance, fatiguing  quickly. Energy conservation education initiated this session. Pt would benefit from additional education in energy conservation strategies     Vision Baseline Vision/History: 1 Wears glasses Ability to See in Adequate Light: 0 Adequate (with glasses) Patient Visual Report: No change from baseline Vision Assessment?: No apparent visual deficits (with glasses)     Perception         Praxis         Pertinent Vitals/Pain Pain Assessment Pain Assessment: No/denies pain     Extremity/Trunk Assessment Upper Extremity Assessment Upper Extremity Assessment: Right hand dominant;Overall [facility] for tasks assessed   Lower Extremity Assessment Lower Extremity Assessment: Defer to PT evaluation   Cervical / Trunk Assessment Cervical / Trunk Assessment: Normal   Communication Communication Communication: No apparent difficulties   Cognition Arousal: Alert Behavior During Therapy: WFL for tasks assessed/performed Cognition: No apparent impairments             OT - Cognition Comments: AAOx4 with cognition WFL                 Following commands: Intact       Cueing  General Comments   Cueing Techniques: Verbal cues;Gestural cues;Visual cues  Pt O2 sat >/94% on 2L continuous O2 through nasal cannula throughout session.   Exercises     Shoulder Instructions      Home Living Family/patient expects to be discharged to:: Private residence Living Arrangements: Alone;Children (Son stays with him sometimes) Available Help at Discharge:  (Son and Daughter check in regularly) Type of Home: House Home Access: Stairs to enter Secretary/administrator of Steps: 4 Entrance Stairs-Rails: Right;Left Home Layout: Multi-level Alternate Level Stairs-Number of Steps: 7 Alternate Level Stairs-Rails: Left Bathroom Shower/Tub: Producer, television/film/video: Handicapped height     Home Equipment: Grab bars - tub/shower;Hand held shower head;Other (comment) (O2 tanks)           Prior Functioning/Environment Prior Level of Function : Independent/Modified Independent;Driving             Mobility Comments: Indep. Denies fall history. ADLs Comments: Indep with ADLs/IADLs. Retired.    OT Problem List: Decreased activity tolerance;Cardiopulmonary status limiting activity   OT Treatment/Interventions: Self-care/ADL training;Energy conservation;DME and/or AE instruction;Therapeutic activities;Patient/family education      OT Goals(Current goals can be found in the care plan section)   Acute Rehab OT Goals Patient Stated Goal: to return home OT Goal Formulation: With patient Time For Goal Achievement: 12/16/23 Potential to Achieve Goals: Good ADL Goals Pt Will Perform Lower Body Bathing: with modified independence;sit to/from stand Pt Will Transfer to Toilet: with modified independence;ambulating;regular height toilet (with least restrictive AD) Additional ADL Goal #1: Patient will demonstrate ability to Independently state 3 energy conservation strategies to increase safety and independence with functional tasks. Additional ADL Goal #2: Patient will demonstrate understanding through teach back of education in strategies for improved overall management of COPD.   OT Frequency:  Min 1X/week    Co-evaluation              AM-PAC OT 6 Clicks Daily Activity     Outcome Measure Help from another person eating meals?: None Help from another person taking care of personal grooming?: A Little Help from another person toileting, which includes using toliet, bedpan, or urinal?: A Little Help from another person bathing (including washing, rinsing, drying)?: A Little  Help from another person to put on and taking off regular upper body clothing?: None Help from another person to put on and taking off regular lower body clothing?: A Little 6 Click Score: 20   End of Session Equipment Utilized During Treatment: Gait belt;Oxygen  Nurse Communication:  Mobility status;Other (comment) (OT POC)  Activity Tolerance: Patient tolerated treatment well Patient left: in chair;with call bell/phone within reach;with family/visitor present  OT Visit Diagnosis: Other (comment) (decreased activity tolerance)                Time: 8894-8880 OT Time Calculation (min): 14 min Charges:  OT General Charges $OT Visit: 1 Visit OT Evaluation $OT Eval Low Complexity: 1 Low  Margarie Rockey HERO., OTR/L, MA Acute Rehab [phone]   Margarie FORBES Horns 12/02/2023, 11:42 AM

## 2023-12-02 NOTE — Plan of Care (Signed)
  Problem: Education: Goal: Individualized Educational Video(s) Outcome: Progressing   Problem: Metabolic: Goal: Ability to maintain appropriate glucose levels will improve Outcome: Progressing   Problem: Nutritional: Goal: Maintenance of adequate nutrition will improve Outcome: Progressing   Problem: Health Behavior/Discharge Planning: Goal: Ability to manage health-related needs will improve Outcome: Progressing   Problem: Clinical Measurements: Goal: Ability to maintain clinical measurements within normal limits will improve Outcome: Progressing Goal: Will remain free from infection Outcome: Progressing

## 2023-12-02 NOTE — Progress Notes (Signed)
SATURATION QUALIFICATIONS: (This note is used to comply with regulatory documentation for home oxygen)  Patient Saturations on Room Air at Rest = 98%  Patient Saturations on Room Air while Ambulating = 96%   

## 2023-12-02 NOTE — Evaluation (Signed)
 Physical Therapy Evaluation Patient Details Name: Jeffrey Randall. MRN: [id number] DOB: 25-Aug-1940 Today's Date: 12/02/2023  History of Present Illness  Jeffrey Randall is a 83 y.o. male admitted 11/29/23 for COPD exacerbation. VQ perfusion scan highly concerning for PE; decreased and heterogenous perfusion of entire left lung and right upper lobe with multiple defects. PMHx: chronic hypoxic respiratory failure, COPD, CKD 3B/4, essential HTN, T2DM, history of lung cancer s/p radiation, gout, BPH, chronic normocytic anemia and HLD. Pt is a current smoker.   Clinical Impression  Pt admitted with above diagnosis. PTA, pt was independent for functional mobility, ADLs/IADLs, and driving. He lives alone in a split level home. Pt reports intermittent family support available. Pt currently with functional limitations due to the deficits listed below (see PT Problem List). He performed bed mobility with modI and required supervision-CGA for OOB mobility. Pt ambulated ~244ft with a reciprocal gait pattern. He demonstrated unsteadiness veering R/L in the hallway. Recommended rollator to provide increased stability through BUE support and aid in energy conservation. Pt presented with decreased activity tolerance. He currently requires 2L O2 via [place] to maintain SpO2 >88% during mobility, which is one liter higher than his baseline. Pt reported a 8/10 on the modified RPE scale following session. Pt will benefit from acute skilled PT to increase his independence and safety with mobility to allow discharge. Recommend HHPT to increase cardiopulmonary endurance, improve balance, decrease fall risk, and optimize safety within the home environment.    If plan is discharge home, recommend the following: A little help with walking and/or transfers;A little help with bathing/dressing/bathroom;Assistance with cooking/housework;Help with stairs or ramp for entrance   Can travel by private vehicle        Equipment  Recommendations Rollator (4 wheels)  Recommendations for Other Services       Functional Status Assessment Patient has had a recent decline in their functional status and demonstrates the ability to make significant improvements in function in a reasonable and predictable amount of time.     Precautions / Restrictions Precautions Precautions: Fall Recall of Precautions/Restrictions: Intact Restrictions Weight Bearing Restrictions Per Provider Order: No      Mobility  Bed Mobility Overal bed mobility: Modified Independent             General bed mobility comments: Pt sat up on L side of bed with increased time. HOB elevated to  ~15deg. No use of bed rails, physical assist, or cues for sequencing.    Transfers Overall transfer level: Needs assistance Equipment used: None Transfers: Sit to/from Stand, Bed to chair/wheelchair/BSC Sit to Stand: Supervision   Step pivot transfers: Contact guard assist       General transfer comment: Pt stood from lowest bed height. He pushed up with BUE support. Transferred to recliner chair on left with CGA for safety. Good eccentric control.    Ambulation/Gait Ambulation/Gait assistance: Contact guard assist Gait Distance (Feet): 200 Feet Assistive device: None Gait Pattern/deviations: Step-through pattern, Decreased stride length, Drifts right/left Gait velocity: decreased Gait velocity interpretation: <1.31 ft/sec, indicative of household ambulator   General Gait Details: Pt ambulated with a reciprocal gait pattern, even weight shift, and good foot clearence. He was slightly unsteady with postural sway noted. No LOB. Discussed use of rollator to provide increased support and aid in energy conservation. Pt agreeable to try AD next session.  Stairs            Wheelchair Mobility     Tilt Bed  Modified Rankin (Stroke Patients Only)       Balance Overall balance assessment: Mild deficits observed, not formally tested                                            Pertinent Vitals/Pain Pain Assessment Pain Assessment: No/denies pain    Home Living Family/patient expects to be discharged to:: Private residence Living Arrangements: Alone;Children (Son sometimes stays with him) Available Help at Discharge: Family;Available PRN/intermittently (Son/Daughter check in reguarly) Type of Home: House Home Access: Stairs to enter Entrance Stairs-Rails: Doctor, general practice of Steps: 4 Alternate Level Stairs-Number of Steps: 7 Home Layout: Multi-level Home Equipment: Grab bars - tub/shower;Hand held shower head;Other (comment) (Oxygen  tanks)      Prior Function Prior Level of Function : Independent/Modified Independent;Driving             Mobility Comments: Indep. Denies fall history. ADLs Comments: Indep with ADLs/IADLs. Retired.     Extremity/Trunk Assessment   Upper Extremity Assessment Upper Extremity Assessment: Defer to OT evaluation    Lower Extremity Assessment Lower Extremity Assessment: Overall WFL for tasks assessed    Cervical / Trunk Assessment Cervical / Trunk Assessment: Normal  Communication   Communication Communication: No apparent difficulties    Cognition Arousal: Alert Behavior During Therapy: WFL for tasks assessed/performed   PT - Cognitive impairments: No apparent impairments                       PT - Cognition Comments: Pt A,Ox4 Following commands: Intact       Cueing Cueing Techniques: Verbal cues, Gestural cues     General Comments General comments (skin integrity, edema, etc.): Pt greeted on 2L O2 via [place] with SpO2 97%. Attempted to titrate pt back to 1L, which is his baseline. Pt desaturated to 86-87% SpO2 during gait. Cued PLB technique throughout. Bumped up to 2L to recover to >88% SpO2. Pt reported 8/10 on the modified RPE scale at the end of the session.    Exercises     Assessment/Plan    PT Assessment  Patient needs continued PT services  PT Problem List Cardiopulmonary status limiting activity;Decreased activity tolerance;Decreased balance;Decreased mobility;Decreased knowledge of use of DME;Decreased safety awareness       PT Treatment Interventions DME instruction;Gait training;Stair training;Functional mobility training;Therapeutic activities;Therapeutic exercise;Balance training;Patient/family education    PT Goals (Current goals can be found in the Care Plan section)  Acute Rehab PT Goals Patient Stated Goal: Return Home with more energy PT Goal Formulation: With patient Time For Goal Achievement: 12/16/23 Potential to Achieve Goals: Good    Frequency Min 2X/week     Co-evaluation               AM-PAC PT 6 Clicks Mobility  Outcome Measure Help needed turning from your back to your side while in a flat bed without using bedrails?: A Little Help needed moving from lying on your back to sitting on the side of a flat bed without using bedrails?: A Little Help needed moving to and from a bed to a chair (including a wheelchair)?: A Little Help needed standing up from a chair using your arms (e.g., wheelchair or bedside chair)?: A Little Help needed to walk in hospital room?: A Little Help needed climbing 3-5 steps with a railing? : A Lot 6 Click Score: 17  End of Session Equipment Utilized During Treatment: Gait belt;Oxygen  Activity Tolerance: Patient tolerated treatment well;Patient limited by fatigue Patient left: in chair;with call bell/phone within reach;with chair alarm set Nurse Communication: Mobility status;Other (comment) (need for new batteries in chair alarm) PT Visit Diagnosis: Difficulty in walking, not elsewhere classified (R26.2);Other abnormalities of gait and mobility (R26.89)    Time: 9179-9158 PT Time Calculation (min) (ACUTE ONLY): 21 min   Charges:   PT Evaluation $PT Eval Moderate Complexity: 1 Mod   PT General Charges $$ ACUTE PT VISIT:  1 Visit         Randall SAUNDERS, PT, DPT Acute Rehabilitation Services Office: [phone] Secure Chat Preferred  Delon CHRISTELLA Callander 12/02/2023, 9:13 AM

## 2023-12-05 ENCOUNTER — Telehealth: Payer: Self-pay

## 2023-12-05 NOTE — Transitions of Care (Post Inpatient/ED Visit) (Signed)
   12/05/2023  Name: Jeffrey Randall. MRN: [id number] DOB: 03/28/1941  Today's TOC FU Call Status: Today's TOC FU Call Status:: Unsuccessful Call (1st Attempt) Unsuccessful Call (1st Attempt) Date: 12/05/23  Attempted to reach the patient regarding the most recent Inpatient/ED visit.  Follow Up Plan: Additional outreach attempts will be made to reach the patient to complete the Transitions of Care (Post Inpatient/ED visit) call.    Bing Edison MSN, RN RN Case Sales executive Health  [facility] Office Hours M-F [phone] Direct Dial : [phone] Main Phone [phone]  Fax: [phone] [email or web address]

## 2023-12-07 DIAGNOSIS — M109 Gout, unspecified: Secondary | ICD-10-CM | POA: Diagnosis not present

## 2023-12-07 DIAGNOSIS — E1122 Type 2 diabetes mellitus with diabetic chronic kidney disease: Secondary | ICD-10-CM | POA: Diagnosis not present

## 2023-12-07 DIAGNOSIS — N1831 Chronic kidney disease, stage 3a: Secondary | ICD-10-CM | POA: Diagnosis not present

## 2023-12-07 DIAGNOSIS — Z87891 Personal history of nicotine dependence: Secondary | ICD-10-CM | POA: Diagnosis not present

## 2023-12-07 DIAGNOSIS — J449 Chronic obstructive pulmonary disease, unspecified: Secondary | ICD-10-CM | POA: Diagnosis not present

## 2023-12-07 DIAGNOSIS — I129 Hypertensive chronic kidney disease with stage 1 through stage 4 chronic kidney disease, or unspecified chronic kidney disease: Secondary | ICD-10-CM | POA: Diagnosis not present

## 2023-12-07 DIAGNOSIS — Z9981 Dependence on supplemental oxygen: Secondary | ICD-10-CM | POA: Diagnosis not present

## 2023-12-07 DIAGNOSIS — Z85118 Personal history of other malignant neoplasm of bronchus and lung: Secondary | ICD-10-CM | POA: Diagnosis not present

## 2023-12-07 DIAGNOSIS — Z7982 Long term (current) use of aspirin: Secondary | ICD-10-CM | POA: Diagnosis not present

## 2023-12-07 DIAGNOSIS — E785 Hyperlipidemia, unspecified: Secondary | ICD-10-CM | POA: Diagnosis not present

## 2023-12-07 DIAGNOSIS — Z7984 Long term (current) use of oral hypoglycemic drugs: Secondary | ICD-10-CM | POA: Diagnosis not present

## 2023-12-07 DIAGNOSIS — Z794 Long term (current) use of insulin: Secondary | ICD-10-CM | POA: Diagnosis not present

## 2023-12-07 DIAGNOSIS — Z556 Problems related to health literacy: Secondary | ICD-10-CM | POA: Diagnosis not present

## 2023-12-13 DIAGNOSIS — M109 Gout, unspecified: Secondary | ICD-10-CM | POA: Diagnosis not present

## 2023-12-13 DIAGNOSIS — Z794 Long term (current) use of insulin: Secondary | ICD-10-CM | POA: Diagnosis not present

## 2023-12-13 DIAGNOSIS — Z7951 Long term (current) use of inhaled steroids: Secondary | ICD-10-CM | POA: Diagnosis not present

## 2023-12-13 DIAGNOSIS — Z7952 Long term (current) use of systemic steroids: Secondary | ICD-10-CM | POA: Diagnosis not present

## 2023-12-13 DIAGNOSIS — J9621 Acute and chronic respiratory failure with hypoxia: Secondary | ICD-10-CM | POA: Diagnosis not present

## 2023-12-13 DIAGNOSIS — E1122 Type 2 diabetes mellitus with diabetic chronic kidney disease: Secondary | ICD-10-CM | POA: Diagnosis not present

## 2023-12-13 DIAGNOSIS — E1165 Type 2 diabetes mellitus with hyperglycemia: Secondary | ICD-10-CM | POA: Diagnosis not present

## 2023-12-13 DIAGNOSIS — N184 Chronic kidney disease, stage 4 (severe): Secondary | ICD-10-CM | POA: Diagnosis not present

## 2023-12-13 DIAGNOSIS — Z9981 Dependence on supplemental oxygen: Secondary | ICD-10-CM | POA: Diagnosis not present

## 2023-12-13 DIAGNOSIS — Z7982 Long term (current) use of aspirin: Secondary | ICD-10-CM | POA: Diagnosis not present

## 2023-12-13 DIAGNOSIS — Z7984 Long term (current) use of oral hypoglycemic drugs: Secondary | ICD-10-CM | POA: Diagnosis not present

## 2023-12-13 DIAGNOSIS — N179 Acute kidney failure, unspecified: Secondary | ICD-10-CM | POA: Diagnosis not present

## 2023-12-13 DIAGNOSIS — E782 Mixed hyperlipidemia: Secondary | ICD-10-CM | POA: Diagnosis not present

## 2023-12-13 DIAGNOSIS — J441 Chronic obstructive pulmonary disease with (acute) exacerbation: Secondary | ICD-10-CM | POA: Diagnosis not present

## 2023-12-13 DIAGNOSIS — M51369 Other intervertebral disc degeneration, lumbar region without mention of lumbar back pain or lower extremity pain: Secondary | ICD-10-CM | POA: Diagnosis not present

## 2023-12-13 DIAGNOSIS — D509 Iron deficiency anemia, unspecified: Secondary | ICD-10-CM | POA: Diagnosis not present

## 2023-12-13 DIAGNOSIS — D631 Anemia in chronic kidney disease: Secondary | ICD-10-CM | POA: Diagnosis not present

## 2023-12-13 DIAGNOSIS — J449 Chronic obstructive pulmonary disease, unspecified: Secondary | ICD-10-CM | POA: Diagnosis not present

## 2023-12-13 DIAGNOSIS — F17210 Nicotine dependence, cigarettes, uncomplicated: Secondary | ICD-10-CM | POA: Diagnosis not present

## 2023-12-13 DIAGNOSIS — I10 Essential (primary) hypertension: Secondary | ICD-10-CM | POA: Diagnosis not present

## 2023-12-13 DIAGNOSIS — Z85118 Personal history of other malignant neoplasm of bronchus and lung: Secondary | ICD-10-CM | POA: Diagnosis not present

## 2023-12-13 DIAGNOSIS — I129 Hypertensive chronic kidney disease with stage 1 through stage 4 chronic kidney disease, or unspecified chronic kidney disease: Secondary | ICD-10-CM | POA: Diagnosis not present

## 2023-12-13 DIAGNOSIS — I2699 Other pulmonary embolism without acute cor pulmonale: Secondary | ICD-10-CM | POA: Diagnosis not present

## 2023-12-13 DIAGNOSIS — Z556 Problems related to health literacy: Secondary | ICD-10-CM | POA: Diagnosis not present

## 2023-12-15 DIAGNOSIS — N184 Chronic kidney disease, stage 4 (severe): Secondary | ICD-10-CM | POA: Diagnosis not present

## 2023-12-15 DIAGNOSIS — M51369 Other intervertebral disc degeneration, lumbar region without mention of lumbar back pain or lower extremity pain: Secondary | ICD-10-CM | POA: Diagnosis not present

## 2023-12-15 DIAGNOSIS — E1165 Type 2 diabetes mellitus with hyperglycemia: Secondary | ICD-10-CM | POA: Diagnosis not present

## 2023-12-15 DIAGNOSIS — Z556 Problems related to health literacy: Secondary | ICD-10-CM | POA: Diagnosis not present

## 2023-12-15 DIAGNOSIS — I2699 Other pulmonary embolism without acute cor pulmonale: Secondary | ICD-10-CM | POA: Diagnosis not present

## 2023-12-15 DIAGNOSIS — Z7951 Long term (current) use of inhaled steroids: Secondary | ICD-10-CM | POA: Diagnosis not present

## 2023-12-15 DIAGNOSIS — Z9981 Dependence on supplemental oxygen: Secondary | ICD-10-CM | POA: Diagnosis not present

## 2023-12-15 DIAGNOSIS — J441 Chronic obstructive pulmonary disease with (acute) exacerbation: Secondary | ICD-10-CM | POA: Diagnosis not present

## 2023-12-15 DIAGNOSIS — Z7982 Long term (current) use of aspirin: Secondary | ICD-10-CM | POA: Diagnosis not present

## 2023-12-15 DIAGNOSIS — F17210 Nicotine dependence, cigarettes, uncomplicated: Secondary | ICD-10-CM | POA: Diagnosis not present

## 2023-12-15 DIAGNOSIS — Z85118 Personal history of other malignant neoplasm of bronchus and lung: Secondary | ICD-10-CM | POA: Diagnosis not present

## 2023-12-15 DIAGNOSIS — M109 Gout, unspecified: Secondary | ICD-10-CM | POA: Diagnosis not present

## 2023-12-15 DIAGNOSIS — E1122 Type 2 diabetes mellitus with diabetic chronic kidney disease: Secondary | ICD-10-CM | POA: Diagnosis not present

## 2023-12-15 DIAGNOSIS — D631 Anemia in chronic kidney disease: Secondary | ICD-10-CM | POA: Diagnosis not present

## 2023-12-15 DIAGNOSIS — Z7952 Long term (current) use of systemic steroids: Secondary | ICD-10-CM | POA: Diagnosis not present

## 2023-12-15 DIAGNOSIS — D509 Iron deficiency anemia, unspecified: Secondary | ICD-10-CM | POA: Diagnosis not present

## 2023-12-15 DIAGNOSIS — E782 Mixed hyperlipidemia: Secondary | ICD-10-CM | POA: Diagnosis not present

## 2023-12-15 DIAGNOSIS — I129 Hypertensive chronic kidney disease with stage 1 through stage 4 chronic kidney disease, or unspecified chronic kidney disease: Secondary | ICD-10-CM | POA: Diagnosis not present

## 2023-12-15 DIAGNOSIS — Z794 Long term (current) use of insulin: Secondary | ICD-10-CM | POA: Diagnosis not present

## 2023-12-15 DIAGNOSIS — J9621 Acute and chronic respiratory failure with hypoxia: Secondary | ICD-10-CM | POA: Diagnosis not present

## 2023-12-15 DIAGNOSIS — Z7984 Long term (current) use of oral hypoglycemic drugs: Secondary | ICD-10-CM | POA: Diagnosis not present

## 2023-12-15 DIAGNOSIS — N179 Acute kidney failure, unspecified: Secondary | ICD-10-CM | POA: Diagnosis not present

## 2023-12-21 DIAGNOSIS — Z7982 Long term (current) use of aspirin: Secondary | ICD-10-CM | POA: Diagnosis not present

## 2023-12-21 DIAGNOSIS — N184 Chronic kidney disease, stage 4 (severe): Secondary | ICD-10-CM | POA: Diagnosis not present

## 2023-12-21 DIAGNOSIS — Z794 Long term (current) use of insulin: Secondary | ICD-10-CM | POA: Diagnosis not present

## 2023-12-21 DIAGNOSIS — E1122 Type 2 diabetes mellitus with diabetic chronic kidney disease: Secondary | ICD-10-CM | POA: Diagnosis not present

## 2023-12-21 DIAGNOSIS — Z85118 Personal history of other malignant neoplasm of bronchus and lung: Secondary | ICD-10-CM | POA: Diagnosis not present

## 2023-12-21 DIAGNOSIS — J441 Chronic obstructive pulmonary disease with (acute) exacerbation: Secondary | ICD-10-CM | POA: Diagnosis not present

## 2023-12-21 DIAGNOSIS — N179 Acute kidney failure, unspecified: Secondary | ICD-10-CM | POA: Diagnosis not present

## 2023-12-21 DIAGNOSIS — M51369 Other intervertebral disc degeneration, lumbar region without mention of lumbar back pain or lower extremity pain: Secondary | ICD-10-CM | POA: Diagnosis not present

## 2023-12-21 DIAGNOSIS — E782 Mixed hyperlipidemia: Secondary | ICD-10-CM | POA: Diagnosis not present

## 2023-12-21 DIAGNOSIS — Z7952 Long term (current) use of systemic steroids: Secondary | ICD-10-CM | POA: Diagnosis not present

## 2023-12-21 DIAGNOSIS — Z7951 Long term (current) use of inhaled steroids: Secondary | ICD-10-CM | POA: Diagnosis not present

## 2023-12-21 DIAGNOSIS — I129 Hypertensive chronic kidney disease with stage 1 through stage 4 chronic kidney disease, or unspecified chronic kidney disease: Secondary | ICD-10-CM | POA: Diagnosis not present

## 2023-12-21 DIAGNOSIS — E1165 Type 2 diabetes mellitus with hyperglycemia: Secondary | ICD-10-CM | POA: Diagnosis not present

## 2023-12-21 DIAGNOSIS — F17210 Nicotine dependence, cigarettes, uncomplicated: Secondary | ICD-10-CM | POA: Diagnosis not present

## 2023-12-21 DIAGNOSIS — Z9981 Dependence on supplemental oxygen: Secondary | ICD-10-CM | POA: Diagnosis not present

## 2023-12-21 DIAGNOSIS — M109 Gout, unspecified: Secondary | ICD-10-CM | POA: Diagnosis not present

## 2023-12-21 DIAGNOSIS — D509 Iron deficiency anemia, unspecified: Secondary | ICD-10-CM | POA: Diagnosis not present

## 2023-12-21 DIAGNOSIS — J9621 Acute and chronic respiratory failure with hypoxia: Secondary | ICD-10-CM | POA: Diagnosis not present

## 2023-12-21 DIAGNOSIS — D631 Anemia in chronic kidney disease: Secondary | ICD-10-CM | POA: Diagnosis not present

## 2023-12-21 DIAGNOSIS — Z7984 Long term (current) use of oral hypoglycemic drugs: Secondary | ICD-10-CM | POA: Diagnosis not present

## 2023-12-21 DIAGNOSIS — Z556 Problems related to health literacy: Secondary | ICD-10-CM | POA: Diagnosis not present

## 2023-12-21 DIAGNOSIS — I2699 Other pulmonary embolism without acute cor pulmonale: Secondary | ICD-10-CM | POA: Diagnosis not present

## 2023-12-27 ENCOUNTER — Other Ambulatory Visit: Payer: Self-pay

## 2023-12-27 DIAGNOSIS — J449 Chronic obstructive pulmonary disease, unspecified: Secondary | ICD-10-CM

## 2024-01-05 ENCOUNTER — Ambulatory Visit (INDEPENDENT_AMBULATORY_CARE_PROVIDER_SITE_OTHER)

## 2024-01-05 ENCOUNTER — Ambulatory Visit: Admitting: Pulmonary Disease

## 2024-01-05 ENCOUNTER — Encounter: Payer: Self-pay | Admitting: Pulmonary Disease

## 2024-01-05 VITALS — BP 142/66 | HR 64 | Temp 98.9°F | Ht 70.0 in | Wt 145.0 lb

## 2024-01-05 DIAGNOSIS — Z9981 Dependence on supplemental oxygen: Secondary | ICD-10-CM | POA: Diagnosis not present

## 2024-01-05 DIAGNOSIS — J449 Chronic obstructive pulmonary disease, unspecified: Secondary | ICD-10-CM | POA: Diagnosis not present

## 2024-01-05 DIAGNOSIS — J9621 Acute and chronic respiratory failure with hypoxia: Secondary | ICD-10-CM | POA: Diagnosis not present

## 2024-01-05 DIAGNOSIS — N184 Chronic kidney disease, stage 4 (severe): Secondary | ICD-10-CM | POA: Diagnosis not present

## 2024-01-05 DIAGNOSIS — M109 Gout, unspecified: Secondary | ICD-10-CM | POA: Diagnosis not present

## 2024-01-05 DIAGNOSIS — Z7982 Long term (current) use of aspirin: Secondary | ICD-10-CM | POA: Diagnosis not present

## 2024-01-05 DIAGNOSIS — Z7952 Long term (current) use of systemic steroids: Secondary | ICD-10-CM | POA: Diagnosis not present

## 2024-01-05 DIAGNOSIS — Z556 Problems related to health literacy: Secondary | ICD-10-CM | POA: Diagnosis not present

## 2024-01-05 DIAGNOSIS — Z7951 Long term (current) use of inhaled steroids: Secondary | ICD-10-CM | POA: Diagnosis not present

## 2024-01-05 DIAGNOSIS — M51369 Other intervertebral disc degeneration, lumbar region without mention of lumbar back pain or lower extremity pain: Secondary | ICD-10-CM | POA: Diagnosis not present

## 2024-01-05 DIAGNOSIS — I129 Hypertensive chronic kidney disease with stage 1 through stage 4 chronic kidney disease, or unspecified chronic kidney disease: Secondary | ICD-10-CM | POA: Diagnosis not present

## 2024-01-05 DIAGNOSIS — D631 Anemia in chronic kidney disease: Secondary | ICD-10-CM | POA: Diagnosis not present

## 2024-01-05 DIAGNOSIS — E1122 Type 2 diabetes mellitus with diabetic chronic kidney disease: Secondary | ICD-10-CM | POA: Diagnosis not present

## 2024-01-05 DIAGNOSIS — N179 Acute kidney failure, unspecified: Secondary | ICD-10-CM | POA: Diagnosis not present

## 2024-01-05 DIAGNOSIS — Z85118 Personal history of other malignant neoplasm of bronchus and lung: Secondary | ICD-10-CM | POA: Diagnosis not present

## 2024-01-05 DIAGNOSIS — E1165 Type 2 diabetes mellitus with hyperglycemia: Secondary | ICD-10-CM | POA: Diagnosis not present

## 2024-01-05 DIAGNOSIS — I2699 Other pulmonary embolism without acute cor pulmonale: Secondary | ICD-10-CM | POA: Diagnosis not present

## 2024-01-05 DIAGNOSIS — F17210 Nicotine dependence, cigarettes, uncomplicated: Secondary | ICD-10-CM

## 2024-01-05 DIAGNOSIS — J441 Chronic obstructive pulmonary disease with (acute) exacerbation: Secondary | ICD-10-CM | POA: Diagnosis not present

## 2024-01-05 DIAGNOSIS — D509 Iron deficiency anemia, unspecified: Secondary | ICD-10-CM | POA: Diagnosis not present

## 2024-01-05 DIAGNOSIS — Z794 Long term (current) use of insulin: Secondary | ICD-10-CM | POA: Diagnosis not present

## 2024-01-05 DIAGNOSIS — E782 Mixed hyperlipidemia: Secondary | ICD-10-CM | POA: Diagnosis not present

## 2024-01-05 DIAGNOSIS — Z7984 Long term (current) use of oral hypoglycemic drugs: Secondary | ICD-10-CM | POA: Diagnosis not present

## 2024-01-05 LAB — PULMONARY FUNCTION TEST
DL/VA % pred: 44 %
DL/VA: 1.69 ml/min/mmHg/L
DLCO cor % pred: 30 %
DLCO cor: 7.38 ml/min/mmHg
DLCO unc % pred: 24 %
DLCO unc: 5.84 ml/min/mmHg
FEF 25-75 Post: 0.55 L/s
FEF 25-75 Pre: 0.49 L/s
FEF2575-%Change-Post: 11 %
FEF2575-%Pred-Post: 30 %
FEF2575-%Pred-Pre: 27 %
FEV1-%Change-Post: 3 %
FEV1-%Pred-Post: 46 %
FEV1-%Pred-Pre: 44 %
FEV1-Post: 1.24 L
FEV1-Pre: 1.2 L
FEV1FVC-%Change-Post: -3 %
FEV1FVC-%Pred-Pre: 62 %
FEV6-%Change-Post: 5 %
FEV6-%Pred-Post: 78 %
FEV6-%Pred-Pre: 73 %
FEV6-Post: 2.79 L
FEV6-Pre: 2.65 L
FEV6FVC-%Change-Post: -3 %
FEV6FVC-%Pred-Post: 103 %
FEV6FVC-%Pred-Pre: 106 %
FVC-%Change-Post: 7 %
FVC-%Pred-Post: 75 %
FVC-%Pred-Pre: 69 %
FVC-Post: 2.91 L
FVC-Pre: 2.7 L
Post FEV1/FVC ratio: 43 %
Post FEV6/FVC ratio: 96 %
Pre FEV1/FVC ratio: 44 %
Pre FEV6/FVC Ratio: 99 %
RV % pred: 226 %
RV: 6.21 L
TLC % pred: 125 %
TLC: 8.9 L

## 2024-01-05 NOTE — Progress Notes (Signed)
 [facility]    [id number]    June 29, 1940  Primary Care Physician:Sheldon Netter, PA  Referring Physician: Sheldon Netter, PA [address] [place],  [place] [address]  Chief complaint: Follow up for COPD exacerbation  HPI: 83 y.o. who  has a past medical history of Alcoholism in recovery [facility]), Aortic atherosclerosis, Back pain (06/17/2014), Chronic lung disease, CKD (chronic kidney disease), COPD (chronic obstructive pulmonary disease) (HCC) (07/23/2014), Essential hypertension, Gout, History of lung cancer, History of pancreatitis, Iron deficiency anemia, Lumbar disc disease, Mixed hyperlipidemia, Notalgia, Prostate hypertrophy, Pulmonary nodules (07/23/2014), Swelling, Tobacco use disorder (07/23/2014), Tumor of lung (01/26/2021), Type 2 diabetes mellitus without complication, and Uncontrolled type 2 diabetes mellitus with hyperglycemia, without long-term current use of insulin  (HCC).   Discussed the use of AI scribe software for clinical note transcription with the patient, who gave verbal consent to proceed.  The patient, with a history of lung nodules, emphysema, and presumed stage 1A lung cancer treated with radiation, presents with shortness of breath that started about a month ago. The shortness of breath occurs with activity such as walking. The patient also has a history of hypertension and diabetes. Despite these conditions, the patient continues to smoke, currently at a pack and a half per day. The patient is on Trelegy COPD but there are concerns about the effectiveness of these medications due to the patient's difficulty with inhalation. The patient also has a nebulizer but it is unclear how much of the medication the patient is actually receiving. The patient has been evaluated for kidney disease and has chronic kidney disease and heart disease.   Hospitalized in March 2025 for COPD exacerbation  Interim history: Discussed the use of AI scribe software for  clinical note transcription with the patient, who gave verbal consent to proceed.  History of Present Illness  Interim history: [facility]. is an 83 year old male with severe COPD who presents for follow-up of his respiratory condition. He is accompanied by his daughter.  Chronic obstructive pulmonary disease (copd) symptoms and management - Severe COPD with FEV1 at 44% of expected value - Managed with Trelegy inhaler without side effects - Uses oxygen  therapy at 1 to 1.5 liters, which causes itching - Uses nebulizer for breathing treatments - Uses albuterol  inhaler for rescue  Recent copd exacerbation and thromboembolic event - Hospitalized in August 2025 for COPD exacerbation and blood clot - VQ scan identified pulmonary embolism - Currently on Eliquis  for anticoagulation - Echocardiogram and ultrasound showed no heart strain or lower extremity clots.  [facility] records reviewed in detail  Tobacco use - History of smoking - Currently attempting cessation, smoking one to three cigarettes per day - Previous use of Chantix  for smoking cessation  Lung cancer surveillance - Undergoing quarterly lung cancer screening scans - Most recent scan in April showed stable scarring   Relevant pulmonary history Pets: Dog Occupation: Retired Curator Exposures: No exposures.  No mold, hot tub, Jacuzzi.  No feather pillows or comforters Smoking history: 90-pack-year smoker.  Continues to smoke Travel history: No significant travel history Relevant family history: No family history of lung disease  Outpatient Encounter Medications as of 01/05/2024  Medication Sig   albuterol  (VENTOLIN  HFA) 108 (90 Base) MCG/ACT inhaler Inhale 1-2 puffs into the lungs every 6 (six) hours as needed for wheezing or shortness of breath.   allopurinol (ZYLOPRIM) 100 MG tablet Take 100 mg by mouth every morning.   amLODipine  (  NORVASC ) 10 MG tablet Take 5 mg by mouth in the morning and at bedtime.   apixaban   (ELIQUIS ) 5 MG TABS tablet Take 1 tablet (5 mg total) by mouth 2 (two) times daily. Start this once the eliquis  starter pack is complete.   APIXABAN  (ELIQUIS ) VTE STARTER PACK (10MG  AND 5MG ) Take as directed on package: start with two-5mg  tablets twice daily for 7 days. On day 8, switch to one-5mg  tablet twice daily.   Cyanocobalamin (VITAMIN B-12 CR) 1500 MCG TBCR Take 1,500 mcg by mouth daily.   dapagliflozin propanediol (FARXIGA) 10 MG TABS tablet Take 1 tablet by mouth daily.   ferrous sulfate  325 (65 FE) MG tablet Take 1 tablet (325 mg total) by mouth every other day.   finasteride  (PROSCAR ) 5 MG tablet Take 5 mg by mouth daily.   Fluticasone -Umeclidin-Vilant (TRELEGY ELLIPTA ) 200-62.5-25 MCG/ACT AEPB Inhale 1 puff into the lungs daily.   insulin  lispro (HUMALOG ) 100 UNIT/ML KwikPen Inject 5 Units into the skin 3 (three) times daily with meals.   Multiple Vitamins-Minerals (MULTIVITAMIN WITH MINERALS) tablet Take 1 tablet by mouth daily.   Omega-3 Fatty Acids (FISH OIL) 1200 MG CAPS Take 1,200 mg by mouth daily.   simvastatin  (ZOCOR ) 10 MG tablet Take 10 mg by mouth at bedtime.   tamsulosin  (FLOMAX ) 0.4 MG CAPS capsule Take 0.8 mg by mouth at bedtime.   valsartan-hydrochlorothiazide  (DIOVAN-HCT) 320-25 MG tablet Take 1 tablet by mouth daily.   Continuous Glucose Sensor (FREESTYLE LIBRE 3 PLUS SENSOR) MISC Use as directed and change every 14 (fourteen) days.   insulin  degludec (TRESIBA ) 100 UNIT/ML FlexTouch Pen Inject 25 Units into the skin daily.   No facility-administered encounter medications on file as of 01/05/2024.   Vitals:   01/05/24 1601  BP: (!) 142/66  Pulse: 64  Temp: 98.9 F (37.2 C)  Height: 5' 10 (1.778 m)  Weight: 145 lb (65.8 kg)  SpO2: 100%  TempSrc: Oral  BMI (Calculated): 20.81     Physical Exam GEN: No acute distress CV: Regular rate and rhythm no murmurs LUNGS: Clear to auscultation bilaterally normal respiratory effort SKIN JOINTS: Warm and dry no rash     Data Reviewed: Imaging: CT chest 01/21/2015 Interval resolution of bilateral pulmonary nodules, moderate upper lobe centrilobular emphysema, 3 textural distortion in the right upper lobe.  I have reviewed the images personally  CT chest [[facility]] 12/09/2022 Interval development of consolidation at the extreme right lung apex, which was not clearly within the radiation portal and has developed later than expected following radiation therapy. Suggest short-term follow-up versus PET/CT for further evaluation.   PET scan [[facility]] 12/22/2022 1.  Redemonstrated consolidation superimposed on right upper lobe radiation fibrosis with mild diffusely increased FDG uptake, favored secondary to infection/inflammation.  No evidence of recurrence or distant metastasis.  2.  Similar prominent mediastinal lymph nodes without substantial uptake, likely reactive.   CT chest [[facility]] 07/12/2023 1.  Stable consolidation and fibrosis involving the right lung apex, favored to reflect changes related to radiation treatment. Recommend continued attention on follow-up imaging per oncologic protocol.  2.  Left lower lobe predominant bronchial wall thickening with fibrosis and volume loss, suggestive of chronic aspiration.  3.  Small volume ascites.   VQ scan 11/30/2023-Multiple perfusion defects concerning for pulmonary embolism Chest x-ray 12/02/2023-hyperinflation, emphysema  PFTs: 01/05/2024 FVC 2.91 [75%], FEV1 1.24 [46%],/43, TLC 8.90 [125%], DLCO 5.84 [24%] Severe obstruction, severe diffusion impairment  Labs: CBC 12/02/2023-WBC 12.9, eos 0% Assessment & Plan Chronic obstructive pulmonary  disease (COPD) COPD exacerbation recently secondary to human metapneumovirus managed with prednisone , now resolved. Currently on Trelegy 200. Recent hospitalization for COPD exacerbation with metapneumovirus, treated with antibiotics and steroids. Oxygen  therapy initiated post-hospitalization. Smoking cessation efforts ongoing  with reduced smoking to one cigarette every other day. Lung function test rescheduled for July to allow recovery from hospitalization. - Continue Trelegy 200 daily. - Encourage smoking cessation, continue Chantix  and nicotine  patches. - Use albuterol  inhaler or nebulizer as needed, up to four times a day. - Perform lung function test in July.  Oxygen  dependence Oxygen  therapy initiated post-hospitalization for COPD exacerbation. Currently on 1 liter of oxygen . Issues with portable oxygen  concentrator availability and mobility discussed.  - He did not desat on exertion today.  Will reassess at return visit  Nicotine  dependence Nicotine  dependence with significant reduction in smoking to one cigarette every other day. Ongoing use of Chantix  and nicotine  patches to aid cessation. - Continue Chantix  and nicotine  patches. - Encourage complete smoking cessation. - Reassess at return visit  Lung cancer Stage 1 lung cancer treated with radiation in 2022. Recent CT scan at [facility] shows findings likely scar tissue, postradiation changes and chronic aspiration rather than recurrence. Follow-up scans every four months.   Severe chronic obstructive pulmonary disease (COPD) Severe COPD with lung function at 44% of expected capacity. Non-compliance with daily inhaler use. Emphasized irreversible nature of lung capacity loss and critical need for smoking cessation to prevent further decline. - Prescribe Ohtuvayre  for nebulizer use twice daily to reduce inflammation and improve breathing. - Continue Trelegy inhaler daily. - Continue oxygen  therapy as needed. - Refer to pulmonary rehabilitation for exercise therapy twice a week for eight weeks.  Tobacco use disorder Ongoing tobacco use disorder with reduced smoking frequency. Previous use of Chantix  noted, but current compliance with smoking cessation aids is unclear. Highlighted that continued smoking will exacerbate lung function decline,  potentially leading to dependence on mechanical ventilation.  Time spent counseling-5 minutes.  Reassess at return visit - Encourage complete smoking cessation. - Discuss potential use of Chantix  or nicotine  patches if needed.  Pulmonary embolism on anticoagulation Pulmonary embolism diagnosed in August with a VQ scan due to chronic kidney disease. Currently on Eliquis  for anticoagulation. No evidence of heart strain or leg clots on recent ultrasound. - Continue Eliquis  for anticoagulation.  Lung cancer under surveillance Stage 1 lung cancer treated with radiation in 2022. Recent CT scan at [facility] shows findings likely scar tissue, postradiation changes and chronic aspiration rather than recurrence.  - Continue quarterly surveillance scans at [facility].   Recommendations: Continue Trelegy, add ohtuvayre  Smoking cessation Pulmonary rehabilitation Continue supplemental oxygen   Lexander Tremblay MD Between Pulmonary and Critical Care 01/05/2024, 4:30 PM  CC: Sheldon Netter, PA

## 2024-01-05 NOTE — Patient Instructions (Addendum)
  VISIT SUMMARY: You had a follow-up visit to discuss your severe COPD, recent hospitalization for a COPD exacerbation and blood clot, ongoing tobacco use, and lung cancer surveillance. We reviewed your current treatments and made some adjustments to help manage your conditions better.  YOUR PLAN: SEVERE CHRONIC OBSTRUCTIVE PULMONARY DISEASE (COPD): Your COPD is severe, with lung function at 44% of expected capacity. It's important to use your inhaler daily and stop smoking to prevent further decline. -Start using Ohtuvayre  for nebulizer treatments twice daily to reduce inflammation and improve breathing. -Continue using your Trelegy inhaler daily. -Continue using oxygen  therapy as needed. -Attend pulmonary rehabilitation for exercise therapy twice a week for eight weeks.  TOBACCO USE DISORDER: You are still smoking one to three cigarettes per day. Quitting smoking is crucial to prevent further damage to your lungs. -Try to completely stop smoking. -Consider using Chantix  or nicotine  patches if you need help quitting.  PULMONARY EMBOLISM ON ANTICOAGULATION: You had a blood clot in August and are currently taking Eliquis  to prevent further clots. -Continue taking Eliquis  as prescribed.  LUNG CANCER UNDER SURVEILLANCE: You are undergoing regular scans to monitor for lung cancer. The last scan showed stable scarring. -Continue with quarterly surveillance scans at [facility].                              Contains text generated by [employer].

## 2024-01-05 NOTE — Progress Notes (Signed)
 Full PFT performed today.

## 2024-01-05 NOTE — Patient Instructions (Signed)
 Full PFT performed today.

## 2024-01-09 ENCOUNTER — Telehealth: Payer: Self-pay

## 2024-01-09 ENCOUNTER — Telehealth (HOSPITAL_COMMUNITY): Payer: Self-pay | Admitting: *Deleted

## 2024-01-09 NOTE — Telephone Encounter (Signed)
 Received referral from Dr. Theophilus for this pt to participate in Pulmonary rehab with the diagnosis of COPD Stage 3.  Address listed in [facility] for [name].  Called and left message for preference of location here [facility] or [facility].  Contact information provided. [facility] PEAK, BSN Cardiac and Emergency planning/management officer

## 2024-01-09 NOTE — Telephone Encounter (Signed)
 Ohtuvayre  new start paperwork was submitted by someone other than the pharmacy team. Received a fax from [employer] stating confirmation of receipt of enrollment paperwork.  Patient ID: [id number]

## 2024-01-16 NOTE — Telephone Encounter (Signed)
 Received fax from [employer] with summary of benefits. Referral form for Ohtuvayre  received. Rx will be triaged to [facility].. Once benefits investigation completed, pharmacy will reach out the patient to schedule shipment. If medication is unaffordable, patient will need to express financial hardship to be referred back to [facility] for patient assistance program pre-screening.   Patient ID: [id number] Pharmacy phone: [phone] [facility] Phone#: [phone]

## 2024-02-09 DIAGNOSIS — E1121 Type 2 diabetes mellitus with diabetic nephropathy: Secondary | ICD-10-CM | POA: Diagnosis not present

## 2024-02-09 DIAGNOSIS — I129 Hypertensive chronic kidney disease with stage 1 through stage 4 chronic kidney disease, or unspecified chronic kidney disease: Secondary | ICD-10-CM | POA: Diagnosis not present

## 2024-02-09 DIAGNOSIS — N184 Chronic kidney disease, stage 4 (severe): Secondary | ICD-10-CM | POA: Diagnosis not present

## 2024-02-09 DIAGNOSIS — N2581 Secondary hyperparathyroidism of renal origin: Secondary | ICD-10-CM | POA: Diagnosis not present

## 2024-02-09 DIAGNOSIS — Z23 Encounter for immunization: Secondary | ICD-10-CM | POA: Diagnosis not present

## 2024-04-13 ENCOUNTER — Emergency Department (HOSPITAL_COMMUNITY)

## 2024-04-13 ENCOUNTER — Emergency Department (HOSPITAL_COMMUNITY)
Admission: EM | Admit: 2024-04-13 | Discharge: 2024-04-14 | Disposition: A | Discharge status: Home / Self Care | Source: Home / Self Care

## 2024-04-13 ENCOUNTER — Encounter (HOSPITAL_COMMUNITY): Payer: Self-pay

## 2024-04-13 DIAGNOSIS — Z85118 Personal history of other malignant neoplasm of bronchus and lung: Secondary | ICD-10-CM | POA: Insufficient documentation

## 2024-04-13 DIAGNOSIS — Z7951 Long term (current) use of inhaled steroids: Secondary | ICD-10-CM | POA: Insufficient documentation

## 2024-04-13 DIAGNOSIS — R0602 Shortness of breath: Secondary | ICD-10-CM | POA: Diagnosis present

## 2024-04-13 DIAGNOSIS — J441 Chronic obstructive pulmonary disease with (acute) exacerbation: Secondary | ICD-10-CM | POA: Diagnosis not present

## 2024-04-13 DIAGNOSIS — Z794 Long term (current) use of insulin: Secondary | ICD-10-CM | POA: Insufficient documentation

## 2024-04-13 DIAGNOSIS — Z7901 Long term (current) use of anticoagulants: Secondary | ICD-10-CM | POA: Diagnosis not present

## 2024-04-13 LAB — BASIC METABOLIC PANEL WITH GFR
Anion gap: 10 (ref 5–15)
BUN: 34 mg/dL — ABNORMAL HIGH (ref 8–23)
CO2: 23 mmol/L (ref 22–32)
Calcium: 8.7 mg/dL — ABNORMAL LOW (ref 8.9–10.3)
Chloride: 105 mmol/L (ref 98–111)
Creatinine, Ser: 2.88 mg/dL — ABNORMAL HIGH (ref 0.61–1.24)
GFR, Estimated: 21 mL/min — ABNORMAL LOW
Glucose, Bld: 347 mg/dL — ABNORMAL HIGH (ref 70–99)
Potassium: 5 mmol/L (ref 3.5–5.1)
Sodium: 137 mmol/L (ref 135–145)

## 2024-04-13 LAB — CBC WITH DIFFERENTIAL/PLATELET
Abs Immature Granulocytes: 0.05 K/uL (ref 0.00–0.07)
Basophils Absolute: 0.1 K/uL (ref 0.0–0.1)
Basophils Relative: 1 %
Eosinophils Absolute: 0.2 K/uL (ref 0.0–0.5)
Eosinophils Relative: 3 %
HCT: 30.3 % — ABNORMAL LOW (ref 39.0–52.0)
Hemoglobin: 9.5 g/dL — ABNORMAL LOW (ref 13.0–17.0)
Immature Granulocytes: 1 %
Lymphocytes Relative: 10 %
Lymphs Abs: 0.7 K/uL (ref 0.7–4.0)
MCH: 30.6 pg (ref 26.0–34.0)
MCHC: 31.4 g/dL (ref 30.0–36.0)
MCV: 97.7 fL (ref 80.0–100.0)
Monocytes Absolute: 0.6 K/uL (ref 0.1–1.0)
Monocytes Relative: 8 %
Neutro Abs: 5.6 K/uL (ref 1.7–7.7)
Neutrophils Relative %: 77 %
Platelets: 347 K/uL (ref 150–400)
RBC: 3.1 MIL/uL — ABNORMAL LOW (ref 4.22–5.81)
RDW: 14.5 % (ref 11.5–15.5)
WBC: 7.3 K/uL (ref 4.0–10.5)
nRBC: 0 % (ref 0.0–0.2)

## 2024-04-13 LAB — RESP PANEL BY RT-PCR (RSV, FLU A&B, COVID)  RVPGX2
Influenza A by PCR: NEGATIVE
Influenza B by PCR: NEGATIVE
Resp Syncytial Virus by PCR: NEGATIVE
SARS Coronavirus 2 by RT PCR: NEGATIVE

## 2024-04-13 LAB — PRO BRAIN NATRIURETIC PEPTIDE: Pro Brain Natriuretic Peptide: 3516 pg/mL — ABNORMAL HIGH

## 2024-04-13 MED ORDER — DOXYCYCLINE HYCLATE 100 MG PO CAPS: 100.0000 mg | ORAL_CAPSULE | Freq: Two times a day (BID) | ORAL | 0 refills | Status: DC

## 2024-04-13 MED ORDER — METHYLPREDNISOLONE SODIUM SUCC 125 MG IJ SOLR
125.0000 mg | Freq: Once | INTRAMUSCULAR | Status: AC
  Administered 2024-04-13: 125 mg via INTRAVENOUS
  Filled 2024-04-13: qty 2

## 2024-04-13 MED ORDER — IPRATROPIUM-ALBUTEROL 0.5-2.5 (3) MG/3ML IN SOLN: 3.0000 mL | RESPIRATORY_TRACT | 0 refills | Status: AC | PRN

## 2024-04-13 MED ORDER — IPRATROPIUM-ALBUTEROL 0.5-2.5 (3) MG/3ML IN SOLN
3.0000 mL | Freq: Once | RESPIRATORY_TRACT | Status: AC
  Administered 2024-04-13: 3 mL via RESPIRATORY_TRACT
  Filled 2024-04-13: qty 3

## 2024-04-13 MED ORDER — DOXYCYCLINE HYCLATE 100 MG PO TABS
100.0000 mg | ORAL_TABLET | Freq: Once | ORAL | Status: AC
  Administered 2024-04-14: 100 mg via ORAL
  Filled 2024-04-13: qty 1

## 2024-04-13 MED ORDER — PREDNISONE 50 MG PO TABS: ORAL_TABLET | ORAL | 0 refills | Status: DC

## 2024-04-13 NOTE — ED Notes (Signed)
 Changed pt O2 tank out at 1700. Pt chronically on 2 L.

## 2024-04-13 NOTE — Discharge Instructions (Signed)
 Please take the steroids and antibiotics as prescribed.  Please use the DuoNebs every 4 hours as needed for cough or shortness of breath.  Return to the ER for abdominal pain worsening symptoms.

## 2024-04-13 NOTE — ED Provider Notes (Signed)
 " [place] EMERGENCY DEPARTMENT AT [facility] Provider Note   CSN: [id number] Arrival date & time: 04/13/24  1438     Patient presents with: Shortness of Breath   Jeffrey Randall. is a 84 y.o. male.   84 year old male with past medical history of COPD and lung cancer in remission as well as pulmonary embolism on Eliquis  presenting to the emergency department today with cough and shortness of breath.  The patient's had a dry cough and shortness of breath over the past few days.  Denies any associated hemoptysis.  Denies any leg swelling compared to baseline.  He is normally on 2 L nasal cannula at baseline.  He came in predominantly due to shortness of breath and cough.  He has been taking his Eliquis  as prescribed.   Shortness of Breath      Prior to Admission medications  Medication Sig Start Date End Date Taking? Authorizing Provider  albuterol  (VENTOLIN  HFA) 108 (90 Base) MCG/ACT inhaler Inhale 1-2 puffs into the lungs every 6 (six) hours as needed for wheezing or shortness of breath. 12/02/23  Yes Perri DELENA Meliton Mickey., MD  allopurinol (ZYLOPRIM) 100 MG tablet Take 100 mg by mouth every morning. 02/25/22  Yes [provider]  amLODipine  (NORVASC ) 10 MG tablet Take 5 mg by mouth in the morning and at bedtime.   Yes [provider]  apixaban  (ELIQUIS ) 5 MG TABS tablet Take 1 tablet (5 mg total) by mouth 2 (two) times daily. Start this once the eliquis  starter pack is complete. 01/02/24  Yes Perri DELENA Meliton Mickey., MD  carvedilol  (COREG ) 12.5 MG tablet Take 12.5 mg by mouth 2 (two) times daily. 03/22/24  Yes [provider]  Cyanocobalamin (VITAMIN B-12 CR) 1500 MCG TBCR Take 1,500 mcg by mouth daily.   Yes [provider]  dapagliflozin propanediol (FARXIGA) 10 MG TABS tablet Take 1 tablet by mouth daily. 09/03/21  Yes [provider]  doxycycline (VIBRAMYCIN) 100 MG capsule Take 1 capsule (100 mg total) by mouth 2 (two) times  daily. 04/13/24  Yes Ula Prentice SAUNDERS, MD  finasteride  (PROSCAR ) 5 MG tablet Take 5 mg by mouth daily. 02/25/22  Yes [provider]  Fluticasone -Umeclidin-Vilant (TRELEGY ELLIPTA ) 200-62.5-25 MCG/ACT AEPB Inhale 1 puff into the lungs daily. 12/02/23 12/01/24 Yes Perri DELENA Meliton Mickey., MD  insulin  degludec (TRESIBA ) 100 UNIT/ML FlexTouch Pen Inject 25 Units into the skin daily. 12/02/23  Yes Perri DELENA Meliton Mickey., MD  ipratropium-albuterol  (DUONEB) 0.5-2.5 (3) MG/3ML SOLN Take 3 mLs by nebulization every 4 (four) hours as needed (Wheezing, SOB). 04/13/24  Yes Ula Prentice SAUNDERS, MD  Multiple Vitamins-Minerals (MULTIVITAMIN WITH MINERALS) tablet Take 1 tablet by mouth daily.   Yes [provider]  Omega-3 Fatty Acids (FISH OIL) 1200 MG CAPS Take 1,200 mg by mouth daily.   Yes [provider]  predniSONE  (DELTASONE ) 50 MG tablet Take 1 tablet by mouth daily 04/13/24  Yes Ula Prentice SAUNDERS, MD  simvastatin  (ZOCOR ) 10 MG tablet Take 10 mg by mouth at bedtime. 02/25/22  Yes [provider]  tamsulosin  (FLOMAX ) 0.4 MG CAPS capsule Take 0.8 mg by mouth at bedtime. 02/25/22  Yes [provider]  valsartan-hydrochlorothiazide  (DIOVAN-HCT) 320-25 MG tablet Take 1 tablet by mouth daily. 04/28/22  Yes [provider]  APIXABAN  (ELIQUIS ) VTE STARTER PACK (10MG  AND 5MG ) Take as directed on package: start with two-5mg  tablets twice daily for 7 days. On day 8, switch to one-5mg  tablet twice daily.  Patient not taking: Reported on 04/13/2024 12/02/23   Perri DELENA Meliton Mickey., MD  ferrous sulfate  325 (65 FE) MG tablet Take 1 tablet (325 mg total) by mouth every other day. Patient not taking: Reported on 04/13/2024 12/03/23   Perri DELENA Meliton Mickey., MD  insulin  lispro (HUMALOG ) 100 UNIT/ML KwikPen Inject 5 Units into the skin 3 (three) times daily with meals. Patient not taking: Reported on 04/13/2024 12/02/23   Perri DELENA Meliton Mickey., MD    Allergies: Chantix  continuing month [varenicline ],  Januvia [sitagliptin], Lisinopril, Motrin [ibuprofen], Remeron [mirtazapine], and Penicillins    Review of Systems  Respiratory:  Positive for shortness of breath.   All other systems reviewed and are negative.   Updated Vital Signs BP (!) 163/66   Pulse (!) 59   Temp 98.1 F (36.7 C) (Oral)   Resp (!) 24   SpO2 100%   Physical Exam Vitals and nursing note reviewed.   Gen: Chronically ill-appearing, mild conversational dyspnea noted Eyes: PERRL, EOMI HEENT: no oropharyngeal swelling Neck: trachea midline Resp: Diminished bilaterally with diffuse wheezes Card: RRR, no murmurs, rubs, or gallops Abd: nontender, nondistended Extremities: no calf tenderness, no edema Vascular: 2+ radial pulses bilaterally, 2+ DP pulses bilaterally Skin: no rashes Psyc: acting appropriately   (all labs ordered are listed, but only abnormal results are displayed) Labs Reviewed  BASIC METABOLIC PANEL WITH GFR - Abnormal; Notable for the following components:      Result Value   Glucose, Bld 347 (*)    BUN 34 (*)    Creatinine, Ser 2.88 (*)    Calcium 8.7 (*)    GFR, Estimated 21 (*)    All other components within normal limits  CBC WITH DIFFERENTIAL/PLATELET - Abnormal; Notable for the following components:   RBC 3.10 (*)    Hemoglobin 9.5 (*)    HCT 30.3 (*)    All other components within normal limits  PRO BRAIN NATRIURETIC PEPTIDE - Abnormal; Notable for the following components:   Pro Brain Natriuretic Peptide 3,516.0 (*)    All other components within normal limits  RESP PANEL BY RT-PCR (RSV, FLU A&B, COVID)  [id number]    EKG: EKG Interpretation Date/Time:  Friday April 13 2024 15:44:16 EST Ventricular Rate:  53 PR Interval:  150 QRS Duration:  90 QT Interval:  474 QTC Calculation: 444 R Axis:   89  Text Interpretation: Sinus bradycardia with Premature atrial complexes in a pattern of bigeminy Otherwise normal ECG When compared with ECG of 13-Apr-2024 15:25, PREVIOUS ECG IS  PRESENT Confirmed by Elnor Savant ([id number]) on 04/13/2024 3:48:17 PM  Radiology: CT Chest Wo Contrast Result Date: 04/13/2024 EXAM: CT CHEST WITHOUT CONTRAST 04/13/2024 09:45:00 PM TECHNIQUE: CT of the chest was performed without the administration of intravenous contrast. Multiplanar reformatted images are provided for review. Automated exposure control, iterative reconstruction, and/or weight based adjustment of the mA/kV was utilized to reduce the radiation dose to as low as reasonably achievable. COMPARISON: 03/24/2016, 10/27/2020 CLINICAL HISTORY: Abnormal xray - lung nodule, >= 1 cm. FINDINGS: MEDIASTINUM: Heart and pericardium are unremarkable. The central airways are clear. Calcific aortic atherosclerosis and coronary artery calcification. LYMPH NODES: No mediastinal, hilar or axillary lymphadenopathy. LUNGS AND PLEURA: Masslike opacity at the right lung apex measuring 2.9 x 2.4 cm with surrounding architectural distortion. This is continuous with areas of apical pleuroparenchymal scarring. No pulmonary edema. No pleural effusion or pneumothorax. SOFT TISSUES/BONES: No acute abnormality of the bones or soft tissues. UPPER ABDOMEN: Limited images of the  upper abdomen demonstrates a left adrenal mass measuring 1.6 x 2.8 cm with attenuation of 12 HU. IMPRESSION: 1. Masslike opacity at the right lung apex measuring 2.9 x 2.4 cm with surrounding architectural distortion. There is likely a component of superimposed radiation fibrosis. Correlation with PET/CT and comparison to prior PET/CT from 10/27/20 may be helpful in determining what part of this, if any, is neoplastic. 2. Left adrenal mass measuring 1.6 x 2.8 cm with attenuation of 12 HU, increased in size since 2022. Consider further evaluation with MRI or adrenal protocol CT on a non-emergent basis. Electronically signed by: Franky Stanford MD 04/13/2024 10:54 PM EST RP Workstation: [id number]   DG Chest 2 View Result Date: 04/13/2024 CLINICAL DATA:  Shortness of  breath EXAM: CHEST - 2 VIEW COMPARISON:  12/02/2023, 07/05/2023, 06/08/2021, 06/28/2023, PET CT 10/27/2020 FINDINGS: Emphysema. Chronic pleuroparenchymal scarring in the left lower lung. No acute airspace disease, pleural effusion or pneumothorax. Irregular pleuroparenchymal opacity at the right apex, appears slightly progressive compared with March examinations. Stable cardiomediastinal silhouette with aortic atherosclerosis IMPRESSION: 1. No active cardiopulmonary disease. Emphysema. 2. Irregular pleuroparenchymal opacity at the right apex, appears slightly progressive compared with March examinations. Recommend correlation with chest CT. Electronically Signed   By: Luke Bun M.D.   On: 04/13/2024 17:23     Procedures   Medications Ordered in the ED  doxycycline (VIBRA-TABS) tablet 100 mg (has no administration in time range)  ipratropium-albuterol  (DUONEB) 0.5-2.5 (3) MG/3ML nebulizer solution 3 mL (3 mLs Nebulization Given 04/13/24 1537)  methylPREDNISolone  sodium succinate (SOLU-MEDROL ) 125 mg/2 mL injection 125 mg (125 mg Intravenous Given 04/13/24 2001)  ipratropium-albuterol  (DUONEB) 0.5-2.5 (3) MG/3ML nebulizer solution 3 mL (3 mLs Nebulization Given 04/13/24 2007)  ipratropium-albuterol  (DUONEB) 0.5-2.5 (3) MG/3ML nebulizer solution 3 mL (3 mLs Nebulization Given 04/13/24 2007)                                    Medical Decision Making 84 year old male with past medical history of COPD on 2 L nasal cannula as well as pulmonary embolism on anticoagulation presenting to the emergency department today with cough and shortness of breath.  The patient is diminished with some wheezing here on exam.  I will further evaluate the patient here with basic labs Wels a chest x-ray to further evaluate for pulmonary edema, pulmonary infiltrates, pneumothorax.  I will give the patient Solu-Medrol  as well as DuoNebs here.  The patient being on Eliquis  and with him having the wheezing on exam with productive  cough suspect is likely due to COPD exacerbation.  Suspicion for pulmonary embolism or worsening PE is low at this time.  The patient's chest x-ray shows a questionable mass lesion.  CT scan is ordered.  This does show findings consistent with site of previous radiation.  The patient symptoms did improve here.  Will obtain an ambulatory pulse ox here and reevaluate.  The patient is BNP is elevated which is nonspecific.  The patient is feeling much better on reassessment.  He is ambulatory here and his ambulatory pulse ox has been in the high 90s and he is feeling much better.  I think that he is safe for discharge.  He will be discharged with return precautions.  Amount and/or Complexity of Data Reviewed Radiology: ordered.  Risk Prescription drug management.        Final diagnoses:  COPD exacerbation [facility])    ED Discharge Orders  Ordered    doxycycline (VIBRAMYCIN) 100 MG capsule  2 times daily        04/13/24 2355    predniSONE  (DELTASONE ) 50 MG tablet        04/13/24 2355    ipratropium-albuterol  (DUONEB) 0.5-2.5 (3) MG/3ML SOLN  Every 4 hours PRN        04/13/24 2355               Ula Prentice SAUNDERS, MD 04/13/24 2355  "

## 2024-04-13 NOTE — ED Triage Notes (Signed)
 Patient sent by PCP for concerns of a PE. He has been progressively getting more shob over the past week. Patient has a history of PE and is on eliquis .  Denies being sick recently and denies any chest pain. Shob worsens when he exerts himself.

## 2024-04-13 NOTE — ED Provider Triage Note (Signed)
 Emergency Medicine Provider Triage Evaluation Note  Jeffrey Randall. , a 84 y.o. male  was evaluated in triage.  Pt complains of SHOB x 1 week, COPD, had PRN O2 at home, now needing O2 all the time. Last neb was yesterday. No fevers. DOE. No CP. Hx PE, on Eliquis , has not missed any doses.   Review of Systems  Positive:  Negative:   Physical Exam  BP (!) 167/107 (BP Location: Right Arm)   Pulse (!) 54   Temp 98.4 F (36.9 C)   Resp (!) 32   SpO2 100%  Gen:   Awake, no distress   Resp:  Tachypenic  MSK:   Moves extremities without difficulty  Other:    Medical Decision Making  Medically screening exam initiated at 3:06 PM.  Appropriate orders placed.  [name]. was informed that the remainder of the evaluation will be completed by another provider, this initial triage assessment does not replace that evaluation, and the importance of remaining in the ED until their evaluation is complete.     Jeffrey Randall LABOR, PA-C 04/13/24 [phone]

## 2024-04-13 NOTE — ED Notes (Signed)
 Pt ambulated with pulse ox, pt maintained 99-100% on 3LO2. Pt tolerated well with no complaints of shortness of breath or dizziness.

## 2024-04-24 ENCOUNTER — Other Ambulatory Visit: Payer: Self-pay

## 2024-04-24 ENCOUNTER — Emergency Department (HOSPITAL_COMMUNITY)

## 2024-04-24 ENCOUNTER — Inpatient Hospital Stay (HOSPITAL_COMMUNITY)
Admission: EM | Admit: 2024-04-24 | Discharge: 2024-04-27 | DRG: 193 | Disposition: A | Discharge status: Home / Self Care | Attending: Family Medicine | Admitting: Family Medicine

## 2024-04-24 DIAGNOSIS — Z79899 Other long term (current) drug therapy: Secondary | ICD-10-CM

## 2024-04-24 DIAGNOSIS — Z85118 Personal history of other malignant neoplasm of bronchus and lung: Secondary | ICD-10-CM

## 2024-04-24 DIAGNOSIS — E1122 Type 2 diabetes mellitus with diabetic chronic kidney disease: Secondary | ICD-10-CM | POA: Diagnosis present

## 2024-04-24 DIAGNOSIS — J441 Chronic obstructive pulmonary disease with (acute) exacerbation: Secondary | ICD-10-CM | POA: Diagnosis present

## 2024-04-24 DIAGNOSIS — Z888 Allergy status to other drugs, medicaments and biological substances status: Secondary | ICD-10-CM

## 2024-04-24 DIAGNOSIS — I130 Hypertensive heart and chronic kidney disease with heart failure and stage 1 through stage 4 chronic kidney disease, or unspecified chronic kidney disease: Secondary | ICD-10-CM | POA: Diagnosis present

## 2024-04-24 DIAGNOSIS — R531 Weakness: Secondary | ICD-10-CM | POA: Diagnosis present

## 2024-04-24 DIAGNOSIS — N184 Chronic kidney disease, stage 4 (severe): Secondary | ICD-10-CM | POA: Diagnosis present

## 2024-04-24 DIAGNOSIS — R7989 Other specified abnormal findings of blood chemistry: Secondary | ICD-10-CM | POA: Diagnosis present

## 2024-04-24 DIAGNOSIS — Z7901 Long term (current) use of anticoagulants: Secondary | ICD-10-CM | POA: Diagnosis not present

## 2024-04-24 DIAGNOSIS — N289 Disorder of kidney and ureter, unspecified: Secondary | ICD-10-CM | POA: Diagnosis present

## 2024-04-24 DIAGNOSIS — D631 Anemia in chronic kidney disease: Secondary | ICD-10-CM | POA: Diagnosis present

## 2024-04-24 DIAGNOSIS — F17210 Nicotine dependence, cigarettes, uncomplicated: Secondary | ICD-10-CM | POA: Diagnosis present

## 2024-04-24 DIAGNOSIS — J101 Influenza due to other identified influenza virus with other respiratory manifestations: Secondary | ICD-10-CM | POA: Diagnosis present

## 2024-04-24 DIAGNOSIS — E782 Mixed hyperlipidemia: Secondary | ICD-10-CM | POA: Diagnosis present

## 2024-04-24 DIAGNOSIS — Z794 Long term (current) use of insulin: Secondary | ICD-10-CM

## 2024-04-24 DIAGNOSIS — J9621 Acute and chronic respiratory failure with hypoxia: Secondary | ICD-10-CM | POA: Diagnosis present

## 2024-04-24 DIAGNOSIS — Z801 Family history of malignant neoplasm of trachea, bronchus and lung: Secondary | ICD-10-CM

## 2024-04-24 DIAGNOSIS — J40 Bronchitis, not specified as acute or chronic: Secondary | ICD-10-CM | POA: Diagnosis present

## 2024-04-24 DIAGNOSIS — E11649 Type 2 diabetes mellitus with hypoglycemia without coma: Secondary | ICD-10-CM | POA: Diagnosis not present

## 2024-04-24 DIAGNOSIS — R0602 Shortness of breath: Secondary | ICD-10-CM | POA: Diagnosis present

## 2024-04-24 DIAGNOSIS — Z880 Allergy status to penicillin: Secondary | ICD-10-CM | POA: Diagnosis not present

## 2024-04-24 DIAGNOSIS — N400 Enlarged prostate without lower urinary tract symptoms: Secondary | ICD-10-CM | POA: Diagnosis present

## 2024-04-24 DIAGNOSIS — Z886 Allergy status to analgesic agent status: Secondary | ICD-10-CM

## 2024-04-24 DIAGNOSIS — Z923 Personal history of irradiation: Secondary | ICD-10-CM

## 2024-04-24 DIAGNOSIS — Z86711 Personal history of pulmonary embolism: Secondary | ICD-10-CM

## 2024-04-24 DIAGNOSIS — M1A9XX0 Chronic gout, unspecified, without tophus (tophi): Secondary | ICD-10-CM | POA: Diagnosis present

## 2024-04-24 DIAGNOSIS — E1165 Type 2 diabetes mellitus with hyperglycemia: Secondary | ICD-10-CM | POA: Diagnosis present

## 2024-04-24 LAB — BASIC METABOLIC PANEL WITH GFR
Anion gap: 8 (ref 5–15)
BUN: 55 mg/dL — ABNORMAL HIGH (ref 8–23)
CO2: 29 mmol/L (ref 22–32)
Calcium: 8.5 mg/dL — ABNORMAL LOW (ref 8.9–10.3)
Chloride: 106 mmol/L (ref 98–111)
Creatinine, Ser: 3.41 mg/dL — ABNORMAL HIGH (ref 0.61–1.24)
GFR, Estimated: 17 mL/min — ABNORMAL LOW
Glucose, Bld: 147 mg/dL — ABNORMAL HIGH (ref 70–99)
Potassium: 4.2 mmol/L (ref 3.5–5.1)
Sodium: 143 mmol/L (ref 135–145)

## 2024-04-24 LAB — RESP PANEL BY RT-PCR (RSV, FLU A&B, COVID)  RVPGX2
Influenza A by PCR: POSITIVE — AB
Influenza B by PCR: NEGATIVE
Resp Syncytial Virus by PCR: NEGATIVE
SARS Coronavirus 2 by RT PCR: NEGATIVE

## 2024-04-24 LAB — CBC WITH DIFFERENTIAL/PLATELET
Abs Immature Granulocytes: 0.06 K/uL (ref 0.00–0.07)
Basophils Absolute: 0 K/uL (ref 0.0–0.1)
Basophils Relative: 0 %
Eosinophils Absolute: 0.4 K/uL (ref 0.0–0.5)
Eosinophils Relative: 4 %
HCT: 28.2 % — ABNORMAL LOW (ref 39.0–52.0)
Hemoglobin: 8.9 g/dL — ABNORMAL LOW (ref 13.0–17.0)
Immature Granulocytes: 1 %
Lymphocytes Relative: 9 %
Lymphs Abs: 1.1 K/uL (ref 0.7–4.0)
MCH: 30.8 pg (ref 26.0–34.0)
MCHC: 31.6 g/dL (ref 30.0–36.0)
MCV: 97.6 fL (ref 80.0–100.0)
Monocytes Absolute: 0.7 K/uL (ref 0.1–1.0)
Monocytes Relative: 6 %
Neutro Abs: 9.9 K/uL — ABNORMAL HIGH (ref 1.7–7.7)
Neutrophils Relative %: 80 %
Platelets: 308 K/uL (ref 150–400)
RBC: 2.89 MIL/uL — ABNORMAL LOW (ref 4.22–5.81)
RDW: 14.5 % (ref 11.5–15.5)
WBC: 12.2 K/uL — ABNORMAL HIGH (ref 4.0–10.5)
nRBC: 0 % (ref 0.0–0.2)

## 2024-04-24 LAB — TROPONIN T, HIGH SENSITIVITY
Troponin T High Sensitivity: 141 ng/L (ref 0–19)
Troponin T High Sensitivity: 140 ng/L (ref 0–19)

## 2024-04-24 LAB — I-STAT VENOUS BLOOD GAS, ED
Acid-Base Excess: 5 mmol/L — ABNORMAL HIGH (ref 0.0–2.0)
Bicarbonate: 30.6 mmol/L — ABNORMAL HIGH (ref 20.0–28.0)
Calcium, Ion: 1.15 mmol/L (ref 1.15–1.40)
HCT: 26 % — ABNORMAL LOW (ref 39.0–52.0)
Hemoglobin: 8.8 g/dL — ABNORMAL LOW (ref 13.0–17.0)
O2 Saturation: 98 %
Potassium: 4 mmol/L (ref 3.5–5.1)
Sodium: 141 mmol/L (ref 135–145)
TCO2: 32 mmol/L (ref 22–32)
pCO2, Ven: 52.8 mmHg (ref 44–60)
pH, Ven: 7.371 (ref 7.25–7.43)
pO2, Ven: 112 mmHg — ABNORMAL HIGH (ref 32–45)

## 2024-04-24 LAB — CBG MONITORING, ED: Glucose-Capillary: 172 mg/dL — ABNORMAL HIGH (ref 70–99)

## 2024-04-24 LAB — PRO BRAIN NATRIURETIC PEPTIDE: Pro Brain Natriuretic Peptide: 1427 pg/mL — ABNORMAL HIGH

## 2024-04-24 MED ORDER — ALBUTEROL SULFATE (2.5 MG/3ML) 0.083% IN NEBU
2.5000 mg | INHALATION_SOLUTION | RESPIRATORY_TRACT | Status: DC | PRN
  Administered 2024-04-24 – 2024-04-26 (×4): 2.5 mg via RESPIRATORY_TRACT
  Filled 2024-04-24 (×5): qty 3

## 2024-04-24 MED ORDER — PREDNISONE 20 MG PO TABS
40.0000 mg | ORAL_TABLET | Freq: Every day | ORAL | Status: DC
  Administered 2024-04-25: 40 mg via ORAL
  Filled 2024-04-24: qty 2

## 2024-04-24 MED ORDER — CARVEDILOL 12.5 MG PO TABS
12.5000 mg | ORAL_TABLET | Freq: Two times a day (BID) | ORAL | Status: DC
  Administered 2024-04-25 – 2024-04-27 (×5): 12.5 mg via ORAL
  Filled 2024-04-24 (×5): qty 1

## 2024-04-24 MED ORDER — APIXABAN 5 MG PO TABS: 10.0000 mg | ORAL_TABLET | Freq: Two times a day (BID) | ORAL | Status: DC

## 2024-04-24 MED ORDER — INSULIN ASPART 100 UNIT/ML IJ SOLN
0.0000 [IU] | Freq: Three times a day (TID) | INTRAMUSCULAR | Status: DC
  Administered 2024-04-26: 7 [IU] via SUBCUTANEOUS
  Administered 2024-04-26: 20 [IU] via SUBCUTANEOUS
  Filled 2024-04-24: qty 7
  Filled 2024-04-24: qty 10
  Filled 2024-04-24: qty 20

## 2024-04-24 MED ORDER — INSULIN ASPART 100 UNIT/ML IJ SOLN: 0.0000 [IU] | Freq: Every day | INTRAMUSCULAR | Status: DC

## 2024-04-24 MED ORDER — INSULIN GLARGINE 100 UNIT/ML ~~LOC~~ SOLN
25.0000 [IU] | Freq: Every day | SUBCUTANEOUS | Status: DC
  Administered 2024-04-25 – 2024-04-27 (×3): 25 [IU] via SUBCUTANEOUS
  Filled 2024-04-24 (×4): qty 0.25

## 2024-04-24 MED ORDER — APIXABAN 5 MG PO TABS
5.0000 mg | ORAL_TABLET | Freq: Two times a day (BID) | ORAL | Status: DC
  Administered 2024-04-24 – 2024-04-27 (×6): 5 mg via ORAL
  Filled 2024-04-24 (×6): qty 1

## 2024-04-24 MED ORDER — OSELTAMIVIR PHOSPHATE 30 MG PO CAPS
30.0000 mg | ORAL_CAPSULE | Freq: Every day | ORAL | Status: DC
  Administered 2024-04-24 – 2024-04-27 (×4): 30 mg via ORAL
  Filled 2024-04-24 (×4): qty 1

## 2024-04-24 MED ORDER — SIMVASTATIN 20 MG PO TABS
10.0000 mg | ORAL_TABLET | Freq: Every day | ORAL | Status: DC
  Administered 2024-04-25 – 2024-04-26 (×2): 10 mg via ORAL
  Filled 2024-04-24 (×2): qty 1

## 2024-04-24 MED ORDER — AMLODIPINE BESYLATE 5 MG PO TABS
10.0000 mg | ORAL_TABLET | Freq: Every day | ORAL | Status: DC
  Administered 2024-04-25 – 2024-04-27 (×3): 10 mg via ORAL
  Filled 2024-04-24: qty 1
  Filled 2024-04-24: qty 2
  Filled 2024-04-24: qty 1

## 2024-04-24 MED ORDER — IRBESARTAN 300 MG PO TABS
300.0000 mg | ORAL_TABLET | Freq: Every day | ORAL | Status: DC
  Administered 2024-04-25 – 2024-04-27 (×3): 300 mg via ORAL
  Filled 2024-04-24 (×3): qty 1

## 2024-04-24 MED ORDER — INSULIN ASPART 100 UNIT/ML IJ SOLN
5.0000 [IU] | Freq: Three times a day (TID) | INTRAMUSCULAR | Status: DC
  Administered 2024-04-26 – 2024-04-27 (×4): 5 [IU] via SUBCUTANEOUS
  Filled 2024-04-24 (×6): qty 5

## 2024-04-24 MED ORDER — TAMSULOSIN HCL 0.4 MG PO CAPS
0.8000 mg | ORAL_CAPSULE | Freq: Every day | ORAL | Status: DC
  Administered 2024-04-24 – 2024-04-26 (×3): 0.8 mg via ORAL
  Filled 2024-04-24 (×3): qty 2

## 2024-04-24 MED ORDER — FINASTERIDE 5 MG PO TABS
5.0000 mg | ORAL_TABLET | Freq: Every day | ORAL | Status: DC
  Administered 2024-04-25 – 2024-04-27 (×3): 5 mg via ORAL
  Filled 2024-04-24 (×3): qty 1

## 2024-04-24 MED ORDER — IPRATROPIUM-ALBUTEROL 0.5-2.5 (3) MG/3ML IN SOLN
3.0000 mL | Freq: Once | RESPIRATORY_TRACT | Status: AC
  Administered 2024-04-24: 3 mL via RESPIRATORY_TRACT
  Filled 2024-04-24: qty 3

## 2024-04-24 MED ORDER — APIXABAN 5 MG PO TABS: 5.0000 mg | ORAL_TABLET | Freq: Two times a day (BID) | ORAL | Status: DC

## 2024-04-24 NOTE — ED Notes (Signed)
 Awaiting patient from lobby.

## 2024-04-24 NOTE — ED Triage Notes (Signed)
 Son stated,  He had the same issue a couple of weeks ago with more SOB, Fatigue. Given breathing treatments, steroids, antibiotic and now he is finished with all that , he is right back were he was before. SOB and fatigue.

## 2024-04-24 NOTE — ED Notes (Signed)
Changed oxygen tank 

## 2024-04-24 NOTE — H&P (Addendum)
 " History and Physical    Patient: Jeffrey Randall. FMW:[id number] DOB: 12-17-1940 DOA: 04/24/2024 DOS: the patient was seen and examined on 04/24/2024 PCP: Sheldon Netter, PA  Patient coming from: Home  Chief Complaint:  Chief Complaint  Patient presents with   Shortness of Breath   Fatigue   HPI: Jeffrey Randall. is a 84 y.o. male with medical history significant of chronic hypoxemic resp failure on 1-2 LNC with COPD, CKD 3b, HTN, DM2, hx lung cancer s/p radiation, gout, chronic normocytic anemia who presented with sob initially seen in ED on 1/2 and treated for presumed COPD exac. Pt reported feeling acutely worse 2 days prior to this ED visit. Pt found to be flu pos. CXR in ED with findings of increased bronchial thickening and opacity in LLL and probable bronchial thickening in RLL. Pt initially required up to [facility]. Pt was started on tamiflu . Hospitalist consulted for consideration for admission   Review of Systems: As mentioned in the history of present illness. All other systems reviewed and are negative. Past Medical History:  Diagnosis Date   Alcoholism in recovery [facility])    Aortic atherosclerosis    Back pain 06/17/2014   Chronic lung disease    CKD (chronic kidney disease)    COPD (chronic obstructive pulmonary disease) (HCC) 07/23/2014   Essential hypertension    Gout    History of lung cancer    History of pancreatitis    Iron deficiency anemia    Lumbar disc disease    Mixed hyperlipidemia    Notalgia    Prostate hypertrophy    Pulmonary nodules 07/23/2014   Swelling    Tobacco use disorder 07/23/2014   Tumor of lung 01/26/2021   Type 2 diabetes mellitus without complication    Uncontrolled type 2 diabetes mellitus with hyperglycemia, without long-term current use of insulin  [facility])    Past Surgical History:  Procedure Laterality Date   BACK SURGERY     Social History:  reports that he has been smoking cigarettes. He started smoking about 62 years ago. He  has a 90 pack-year smoking history. He uses smokeless tobacco. He reports that he does not currently use alcohol after a past usage of about 3.0 standard drinks of alcohol per week. He reports that he does not use drugs.  Allergies[1]  Family History  Problem Relation Age of Onset   Lung cancer Mother    Lung cancer Father     Prior to Admission medications  Medication Sig Start Date End Date Taking? Authorizing Provider  albuterol  (VENTOLIN  HFA) 108 (90 Base) MCG/ACT inhaler Inhale 1-2 puffs into the lungs every 6 (six) hours as needed for wheezing or shortness of breath. 12/02/23   Perri DELENA Meliton Mickey., MD  allopurinol (ZYLOPRIM) 100 MG tablet Take 100 mg by mouth every morning. 02/25/22   [provider]  amLODipine  (NORVASC ) 10 MG tablet Take 5 mg by mouth in the morning and at bedtime.    [provider]  apixaban  (ELIQUIS ) 5 MG TABS tablet Take 1 tablet (5 mg total) by mouth 2 (two) times daily. Start this once the eliquis  starter pack is complete. 01/02/24   Perri DELENA Meliton Mickey., MD  APIXABAN  (ELIQUIS ) VTE STARTER PACK (10MG  AND 5MG ) Take as directed on package: start with two-5mg  tablets twice daily for 7 days. On day 8, switch to one-5mg  tablet twice daily. Patient not taking: Reported on 04/13/2024 12/02/23   Perri DELENA Meliton Mickey., MD  carvedilol  (COREG )  12.5 MG tablet Take 12.5 mg by mouth 2 (two) times daily. 03/22/24   [provider]  Cyanocobalamin (VITAMIN B-12 CR) 1500 MCG TBCR Take 1,500 mcg by mouth daily.    [provider]  dapagliflozin propanediol (FARXIGA) 10 MG TABS tablet Take 1 tablet by mouth daily. 09/03/21   [provider]  doxycycline  (VIBRAMYCIN ) 100 MG capsule Take 1 capsule (100 mg total) by mouth 2 (two) times daily. 04/13/24   Ula Prentice SAUNDERS, MD  ferrous sulfate  325 (65 FE) MG tablet Take 1 tablet (325 mg total) by mouth every other day. Patient not taking: Reported on 04/13/2024 12/03/23   Perri DELENA Meliton Mickey., MD   finasteride  (PROSCAR ) 5 MG tablet Take 5 mg by mouth daily. 02/25/22   [provider]  Fluticasone -Umeclidin-Vilant (TRELEGY ELLIPTA ) 200-62.5-25 MCG/ACT AEPB Inhale 1 puff into the lungs daily. 12/02/23 12/01/24  Perri DELENA Meliton Mickey., MD  insulin  degludec (TRESIBA ) 100 UNIT/ML FlexTouch Pen Inject 25 Units into the skin daily. 12/02/23   Perri DELENA Meliton Mickey., MD  insulin  lispro (HUMALOG ) 100 UNIT/ML KwikPen Inject 5 Units into the skin 3 (three) times daily with meals. Patient not taking: Reported on 04/13/2024 12/02/23   Perri DELENA Meliton Mickey., MD  ipratropium-albuterol  (DUONEB) 0.5-2.5 (3) MG/3ML SOLN Take 3 mLs by nebulization every 4 (four) hours as needed (Wheezing, SOB). 04/13/24   Ula Prentice SAUNDERS, MD  Multiple Vitamins-Minerals (MULTIVITAMIN WITH MINERALS) tablet Take 1 tablet by mouth daily.    [provider]  Omega-3 Fatty Acids (FISH OIL) 1200 MG CAPS Take 1,200 mg by mouth daily.    [provider]  predniSONE  (DELTASONE ) 50 MG tablet Take 1 tablet by mouth daily 04/13/24   Ula Prentice SAUNDERS, MD  simvastatin  (ZOCOR ) 10 MG tablet Take 10 mg by mouth at bedtime. 02/25/22   [provider]  tamsulosin  (FLOMAX ) 0.4 MG CAPS capsule Take 0.8 mg by mouth at bedtime. 02/25/22   [provider]  valsartan-hydrochlorothiazide  (DIOVAN-HCT) 320-25 MG tablet Take 1 tablet by mouth daily. 04/28/22   [provider]    Physical Exam: Vitals:   04/24/24 1044 04/24/24 1444 04/24/24 1715 04/24/24 1730  BP: (!) 151/56 (!) 158/96 (!) 177/73 (!) 174/75  Pulse: (!) 59 61 60 60  Resp: 17 19 20 19   Temp: 97.8 F (36.6 C) 97.7 F (36.5 C)    TempSrc:  Oral    SpO2: 96% 98% 95% 95%   General exam: Awake, laying in bed, in nad Respiratory system: Increased resp effort, decreased BS with wheezing throughout Cardiovascular system: regular rate, s1, s2 Gastrointestinal system: Soft, nondistended, positive BS Central nervous system: CN2-12 grossly intact,  strength intact Extremities: Perfused, no clubbing Skin: Normal skin turgor, no notable skin lesions seen Psychiatry: Mood normal // affect seems normal  Data Reviewed:  Labs reviewed: Na 143, K 4.2, Cr 3.41, WBC 12.2, Hgb 8.8, Plts 308  Assessment and Plan: Acute on Chronic Hypoxic Respiratory Failure secondary to flu A and bronchitis History of Lung cancer s/p Radiation Therapy  CXR reviewed, notable for increased bronchial thickening and opacity in the inferior LLL Afebrile Pt found to be flu A pos. Tamiflu  started in ED, will continue Typically on 1-2 L at baseline, needed up to 3L in ED. Marked wheezing with decreased air movement and increased resp effort on exam Cont neb as needed. Will continue on systemic steroids   Hx of  Pulmonary Embolism Diagnosed in 8/24 Continue eliquis  per home regimen   Hx  COPD Cont neb tx as needed   CKD stage IV Cr appears around baseline at time of presentation Recheck bmet in AM   T2DM with Hyperglycemia Continue basal/bolus and SSI    History of BPH Continue finasteride  per home regimen   Chronic anemia Hgb trends appear stable Pt hemodynamically stable Recheck cbc in AM   Essential hypertension BP currently suboptimally controlled on novasc 10mg , coreg  12.5mg , and diovan 320-25mg  PTA Cont home regimen    Advance Care Planning:   Code Status: Prior Full, confirmed with pt and family at bedside  Consults:   Family Communication: Pt in room, family at bedside  Severity of Illness: The appropriate patient status for this patient is INPATIENT. Inpatient status is judged to be reasonable and necessary in order to provide the required intensity of service to ensure the patient's safety. The patient's presenting symptoms, physical exam findings, and initial radiographic and laboratory data in the context of their chronic comorbidities is felt to place them at high risk for further clinical deterioration. Furthermore, it is not  anticipated that the patient will be medically stable for discharge from the hospital within 2 midnights of admission.   * I certify that at the point of admission it is my clinical judgment that the patient will require inpatient hospital care spanning beyond 2 midnights from the point of admission due to high intensity of service, high risk for further deterioration and high frequency of surveillance required.*  Author: Garnette Pelt, MD 04/24/2024 6:15 PM  For on call review [email or web address].      [1]  Allergies Allergen Reactions   Chantix  Continuing Month [Varenicline ] Other (See Comments)    Doesn't work for patient   Januvia [Sitagliptin] Other (See Comments)    Class Contraindicated   Lisinopril Swelling    Possible tongue swelling. ARB's okay   Motrin [Ibuprofen] Other (See Comments)    Contraindicated by Renal disease   Remeron [Mirtazapine] Other (See Comments)    Excess somnolence on the 15 mg dose   Penicillins Other (See Comments)    Facial swelling   "

## 2024-04-24 NOTE — ED Provider Triage Note (Signed)
 Emergency Medicine Provider Triage Evaluation Note  Jeffrey Randall. , a 84 y.o. male  was evaluated in triage.  Pt complains of worsening shortness of breath nonproductive cough over the last few weeks.  Has completed course of steroids and antibiotics with minimal improvement.  Saw PCP today who referred him here for further management and possible admission.  Review of Systems  Positive: Shortness of breath Negative: Fever  Physical Exam  BP (!) 151/56   Pulse (!) 59   Temp 97.8 F (36.6 C)   Resp 17   SpO2 96%  Gen:   Awake, no distress   Resp:  Normal effort  MSK:   Moves extremities without difficulty  Other:    Medical Decision Making  Medically screening exam initiated at 10:59 AM.  Appropriate orders placed.  Jeffrey [name]. was informed that the remainder of the evaluation will be completed by another provider, this initial triage assessment does not replace that evaluation, and the importance of remaining in the ED until their evaluation is complete.     Jeffrey Ozell BROCKS, MD 04/24/24 1710

## 2024-04-24 NOTE — Progress Notes (Addendum)
 PHARMACY - ANTICOAGULATION CONSULT NOTE  Pharmacy Consult for apixaban  Indication: hx of pulmonary embolus  Allergies[1]  Patient Measurements:    Vital Signs: Temp: 97.7 F (36.5 C) (01/13 1444) Temp Source: Oral (01/13 1444) BP: 174/75 (01/13 1730) Pulse Rate: 60 (01/13 1730)  Labs: Recent Labs    04/24/24 1104 04/24/24 1122  HGB 8.9* 8.8*  HCT 28.2* 26.0*  PLT 308  --   CREATININE 3.41*  --     CrCl cannot be calculated (Unknown ideal weight.).   Medical History: Past Medical History:  Diagnosis Date   Alcoholism in recovery [facility])    Aortic atherosclerosis    Back pain 06/17/2014   Chronic lung disease    CKD (chronic kidney disease)    COPD (chronic obstructive pulmonary disease) (HCC) 07/23/2014   Essential hypertension    Gout    History of lung cancer    History of pancreatitis    Iron deficiency anemia    Lumbar disc disease    Mixed hyperlipidemia    Notalgia    Prostate hypertrophy    Pulmonary nodules 07/23/2014   Swelling    Tobacco use disorder 07/23/2014   Tumor of lung 01/26/2021   Type 2 diabetes mellitus without complication    Uncontrolled type 2 diabetes mellitus with hyperglycemia, without long-term current use of insulin  (HCC)     Medications:  (Not in a hospital admission)   Assessment: 84 yo M presenting with SOB. Pharmacy consulted to dose apixaban  for hx of PE. Pt was previously placed on apixaban  for PE 11/2022, but appears to have been non-compliant per fill hx (last fill 01/03/24).    Plan:  Apixaban  5mg  PO BID  Monitor for signs and symptoms of bleeding  Elma Fail, PharmD PGY1 Clinical Pharmacist [facility]  04/24/2024 7:20 PM       [1]  Allergies Allergen Reactions   Chantix  Continuing Month [Varenicline ] Other (See Comments)    Doesn't work for patient   Januvia [Sitagliptin] Other (See Comments)    Class Contraindicated   Lisinopril Swelling    Possible tongue swelling. ARB's okay    Motrin [Ibuprofen] Other (See Comments)    Contraindicated by Renal disease   Remeron [Mirtazapine] Other (See Comments)    Excess somnolence on the 15 mg dose   Penicillins Other (See Comments)    Facial swelling

## 2024-04-24 NOTE — ED Notes (Signed)
 Awaiting Tamiflu from main pharmacy.

## 2024-04-24 NOTE — ED Provider Notes (Addendum)
 " [facility] Provider Note   CSN: [id number] Arrival date & time: 04/24/24  1038     Patient presents with: Shortness of Breath and Fatigue   Jeffrey Randall. is a 84 y.o. male with history of stage IV COPD on 2 L nasal cannula baseline, chronic hypoxic respiratory failure, stage III kidney disease, high blood pressure, diabetes, lung cancer s/p radiation, normocytic anemia, hyperlipidemia, presenting to the ED with concern for generalized weakness.  Patient reports that he was seen in the hospital on January 2 and diagnosed and treated in the ED for COPD exacerbation.  I reviewed his medical records and patient was treated with steroid burst as well as doxycycline  and DuoNebs from the ED.  He said he began to feel better after his treatment.  However, 2 days ago he abruptly began feeling worse.  He says he is so weak that it would have energy to even shave my face.  Denies worsening shortness of breath, coughing, sore throat, headache, fevers.  Went to see his PCP advised that he come into the ED.  He denies chest pain  He has a history of pulmonary embolism but is compliant on Eliquis    HPI     Prior to Admission medications  Medication Sig Start Date End Date Taking? Authorizing Provider  albuterol  (VENTOLIN  HFA) 108 (90 Base) MCG/ACT inhaler Inhale 1-2 puffs into the lungs every 6 (six) hours as needed for wheezing or shortness of breath. 12/02/23   Perri DELENA Meliton Mickey., MD  allopurinol (ZYLOPRIM) 100 MG tablet Take 100 mg by mouth every morning. 02/25/22   [provider]  amLODipine  (NORVASC ) 10 MG tablet Take 5 mg by mouth in the morning and at bedtime.    [provider]  apixaban  (ELIQUIS ) 5 MG TABS tablet Take 1 tablet (5 mg total) by mouth 2 (two) times daily. Start this once the eliquis  starter pack is complete. 01/02/24   Perri DELENA Meliton Mickey., MD  APIXABAN  (ELIQUIS ) VTE STARTER PACK (10MG  AND 5MG ) Take as  directed on package: start with two-5mg  tablets twice daily for 7 days. On day 8, switch to one-5mg  tablet twice daily. Patient not taking: Reported on 04/13/2024 12/02/23   Perri DELENA Meliton Mickey., MD  carvedilol  (COREG ) 12.5 MG tablet Take 12.5 mg by mouth 2 (two) times daily. 03/22/24   [provider]  Cyanocobalamin (VITAMIN B-12 CR) 1500 MCG TBCR Take 1,500 mcg by mouth daily.    [provider]  dapagliflozin propanediol (FARXIGA) 10 MG TABS tablet Take 1 tablet by mouth daily. 09/03/21   [provider]  doxycycline  (VIBRAMYCIN ) 100 MG capsule Take 1 capsule (100 mg total) by mouth 2 (two) times daily. 04/13/24   Ula Prentice SAUNDERS, MD  ferrous sulfate  325 (65 FE) MG tablet Take 1 tablet (325 mg total) by mouth every other day. Patient not taking: Reported on 04/13/2024 12/03/23   Perri DELENA Meliton Mickey., MD  finasteride  (PROSCAR ) 5 MG tablet Take 5 mg by mouth daily. 02/25/22   [provider]  Fluticasone -Umeclidin-Vilant (TRELEGY ELLIPTA ) 200-62.5-25 MCG/ACT AEPB Inhale 1 puff into the lungs daily. 12/02/23 12/01/24  Perri DELENA Meliton Mickey., MD  insulin  degludec (TRESIBA ) 100 UNIT/ML FlexTouch Pen Inject 25 Units into the skin daily. 12/02/23   Perri DELENA Meliton Mickey., MD  insulin  lispro (HUMALOG ) 100 UNIT/ML KwikPen Inject 5 Units into the skin 3 (three) times daily with meals. Patient not taking: Reported on 04/13/2024 12/02/23  Perri DELENA Meliton Mickey., MD  ipratropium-albuterol  (DUONEB) 0.5-2.5 (3) MG/3ML SOLN Take 3 mLs by nebulization every 4 (four) hours as needed (Wheezing, SOB). 04/13/24   Ula Prentice SAUNDERS, MD  Multiple Vitamins-Minerals (MULTIVITAMIN WITH MINERALS) tablet Take 1 tablet by mouth daily.    [provider]  Omega-3 Fatty Acids (FISH OIL) 1200 MG CAPS Take 1,200 mg by mouth daily.    [provider]  predniSONE  (DELTASONE ) 50 MG tablet Take 1 tablet by mouth daily 04/13/24   Ula Prentice SAUNDERS, MD  simvastatin  (ZOCOR ) 10 MG tablet Take 10 mg by  mouth at bedtime. 02/25/22   [provider]  tamsulosin  (FLOMAX ) 0.4 MG CAPS capsule Take 0.8 mg by mouth at bedtime. 02/25/22   [provider]  valsartan-hydrochlorothiazide  (DIOVAN-HCT) 320-25 MG tablet Take 1 tablet by mouth daily. 04/28/22   [provider]    Allergies: Chantix  continuing month [varenicline ], Januvia [sitagliptin], Lisinopril, Motrin [ibuprofen], Remeron [mirtazapine], and Penicillins    Review of Systems  Updated Vital Signs BP (!) 158/96 (BP Location: Right Arm)   Pulse 61   Temp 97.7 F (36.5 C) (Oral)   Resp 19   SpO2 98%   Physical Exam Constitutional:      General: He is not in acute distress. HENT:     Head: Normocephalic and atraumatic.  Eyes:     Conjunctiva/sclera: Conjunctivae normal.     Pupils: Pupils are equal, round, and reactive to light.  Cardiovascular:     Rate and Rhythm: Normal rate and regular rhythm.  Pulmonary:     Effort: Pulmonary effort is normal. No respiratory distress.     Comments: 2 L nasal cannula, no significant audible wheezing, patient speaking full sentences, rhonchorous breath sounds in the lower lung field Abdominal:     General: There is no distension.     Tenderness: There is no abdominal tenderness.  Skin:    General: Skin is warm and dry.  Neurological:     General: No focal deficit present.     Mental Status: He is alert. Mental status is at baseline.  Psychiatric:        Mood and Affect: Mood normal.        Behavior: Behavior normal.     (all labs ordered are listed, but only abnormal results are displayed) Labs Reviewed  RESP PANEL BY RT-PCR (RSV, FLU A&B, COVID)  RVPGX2 - Abnormal; Notable for the following components:      Result Value   Influenza A by PCR POSITIVE (*)    All other components within normal limits  BASIC METABOLIC PANEL WITH GFR - Abnormal; Notable for the following components:   Glucose, Bld 147 (*)    BUN 55 (*)    Creatinine, Ser 3.41 (*)     Calcium 8.5 (*)    GFR, Estimated 17 (*)    All other components within normal limits  CBC WITH DIFFERENTIAL/PLATELET - Abnormal; Notable for the following components:   WBC 12.2 (*)    RBC 2.89 (*)    Hemoglobin 8.9 (*)    HCT 28.2 (*)    Neutro Abs 9.9 (*)    All other components within normal limits  PRO BRAIN NATRIURETIC PEPTIDE - Abnormal; Notable for the following components:   Pro Brain Natriuretic Peptide 1,427.0 (*)    All other components within normal limits  I-STAT VENOUS BLOOD GAS, ED - Abnormal; Notable for the following components:   pO2, Ven 112 (*)    Bicarbonate  30.6 (*)    Acid-Base Excess 5.0 (*)    HCT 26.0 (*)    Hemoglobin 8.8 (*)    All other components within normal limits  TROPONIN T, HIGH SENSITIVITY - Abnormal; Notable for the following components:   Troponin T High Sensitivity 141 (*)    All other components within normal limits  TROPONIN T, HIGH SENSITIVITY - Abnormal; Notable for the following components:   Troponin T High Sensitivity 140 (*)    All other components within normal limits    EKG: EKG Interpretation Date/Time:  Tuesday April 24 2024 10:48:10 EST Ventricular Rate:  72 PR Interval:  138 QRS Duration:  106 QT Interval:  416 QTC Calculation: 455 R Axis:   87  Text Interpretation: Sinus rhythm with Premature atrial complexes with Abberant conduction T wave abnormality, consider inferolateral ischemia Abnormal ECG When compared with ECG of 13-Apr-2024 15:44, increased ectopy now Confirmed by Towana Sharper [id number]) on 04/24/2024 10:51:37 AM  Radiology: [facility] Chest 2 View Result Date: 04/24/2024 CLINICAL DATA:  Cough, shortness of breath, fatigue and COPD. EXAM: CHEST - 2 VIEW COMPARISON:  04/13/2024 FINDINGS: The heart size and mediastinal contours are within normal limits. Suggestion of potentially increased bronchial thickening and opacity in the inferior left lower lobe which may be consistent with acute bronchitis or pneumonia.  Probable bronchial thickening and mucous plugging in the right lower lobe. Stable opacity and scarring at the right lung apex. Stable advanced emphysematous lung disease. The visualized skeletal structures are unremarkable. IMPRESSION: Suggestion of increased bronchial thickening and opacity in the inferior left lower lobe which may be consistent with acute bronchitis or pneumonia. Probable bronchial thickening and mucous plugging in the right lower lobe. Electronically Signed   By: Marcey Moan M.D.   On: 04/24/2024 12:01     Procedures   Medications Ordered in the ED  oseltamivir  (TAMIFLU ) capsule 30 mg (30 mg Oral Given 04/24/24 1648)  ipratropium-albuterol  (DUONEB) 0.5-2.5 (3) MG/3ML nebulizer solution 3 mL (3 mLs Nebulization Given 04/24/24 1536)    Clinical Course as of 04/24/24 1730  Tue Apr 24, 2024  1730 Admitted to hospitalist Dr Laurence [MT]    Clinical Course User Index [MT] Cottie Donnice PARAS, MD                                 Medical Decision Making Risk Prescription drug management. Decision regarding hospitalization.   .This patient presents to the ED with concern for shortness of breath, general fatigue. This involves an extensive number of treatment options, and is a complaint that carries with it a high risk of complications and morbidity.  The differential diagnosis includes viral illness including influenza versus COPD exacerbation versus anemia versus oral effusion versus bacterial pneumonia versus other  Co-morbidities that complicate the patient evaluation: Known history of advanced stage IV COPD at risk of pulmonary exacerbation  External records from outside source obtained and reviewed including ED evaluation from January 2 as well as PCP records in the office today  I ordered and personally interpreted labs.  The pertinent results include: Chronic stable anemia, unchanged.  Influenza is positive.  History of kidney disease with creatinine near baseline levels  today, 3.4.  BNP is elevated at 1400.  Troponins are elevated but flat on repeat, likely related to some demand ischemia related to congestive heart failure.  He does not have active chest pain to raise concern at this time for ACS  I ordered imaging studies including x-ray of the chest I independently visualized and interpreted imaging which showed bronchial thickening, questionable opacity I agree with the radiologist interpretation  The patient was maintained on a cardiac monitor.  I personally viewed and interpreted the cardiac monitored which showed an underlying rhythm of: Sinus rhythm with PVCs  Per my interpretation the patient's ECG shows sinus rhythm, PVC, no acute ischemia  I ordered medication including neb for mild wheezing, Tamiflu  (renal adjusted dose) for acute onset influenza, given his comorbidities  I have reviewed the patients home medicines and have made adjustments as needed  Test Considered: Lower suspicion for acute PE.  Patient is compliant on his Eliquis .  No negation for angiogram scanning.   After the interventions noted above, I reevaluated the patient and found that they have: stayed the same  Given patient's advanced age, comorbidities including severe lung disease, chronic hypoxia, as well as the relatively new and acute onset of influenza now on day 2, I think it would be reasonable to keep him in observation in the hospital overnight and ensure that his breathing remained stable.  Do not believe that he needs recurrent steroids at this time this is not consistent with a COPD exacerbation.  He  may have some mild congestive heart failure exacerbation at play here, but I would hold off on diuresis given his poor renal function, unless he has worsening hypoxia, anemia, or respiratory distress.  Will defer to inpatient team regarding need for repeat echocardiogram  Dispostion:  After consideration of the diagnostic results and the patients response to treatment,  I feel that the patent would benefit from medical admission.      Final diagnoses:  Shortness of breath  Influenza A    ED Discharge Orders     None          Neizan Debruhl, Donnice PARAS, MD 04/24/24 1523    Cottie Donnice PARAS, MD 04/24/24 1731  "

## 2024-04-24 NOTE — ED Notes (Signed)
 Triage provider notified of critical lab values  Troponin 141

## 2024-04-25 ENCOUNTER — Encounter (HOSPITAL_COMMUNITY): Payer: Self-pay | Admitting: Internal Medicine

## 2024-04-25 ENCOUNTER — Inpatient Hospital Stay (HOSPITAL_COMMUNITY)

## 2024-04-25 DIAGNOSIS — J101 Influenza due to other identified influenza virus with other respiratory manifestations: Secondary | ICD-10-CM | POA: Diagnosis not present

## 2024-04-25 LAB — COMPREHENSIVE METABOLIC PANEL WITH GFR
ALT: 22 U/L (ref 0–44)
AST: 19 U/L (ref 15–41)
Albumin: 2.9 g/dL — ABNORMAL LOW (ref 3.5–5.0)
Alkaline Phosphatase: 121 U/L (ref 38–126)
Anion gap: 8 (ref 5–15)
BUN: 50 mg/dL — ABNORMAL HIGH (ref 8–23)
CO2: 27 mmol/L (ref 22–32)
Calcium: 8.5 mg/dL — ABNORMAL LOW (ref 8.9–10.3)
Chloride: 107 mmol/L (ref 98–111)
Creatinine, Ser: 3.15 mg/dL — ABNORMAL HIGH (ref 0.61–1.24)
GFR, Estimated: 19 mL/min — ABNORMAL LOW
Glucose, Bld: 124 mg/dL — ABNORMAL HIGH (ref 70–99)
Potassium: 4 mmol/L (ref 3.5–5.1)
Sodium: 142 mmol/L (ref 135–145)
Total Bilirubin: <0.2 mg/dL (ref 0.0–1.2)
Total Protein: 5.9 g/dL — ABNORMAL LOW (ref 6.5–8.1)

## 2024-04-25 LAB — CBC
HCT: 29.8 % — ABNORMAL LOW (ref 39.0–52.0)
Hemoglobin: 9.2 g/dL — ABNORMAL LOW (ref 13.0–17.0)
MCH: 29.8 pg (ref 26.0–34.0)
MCHC: 30.9 g/dL (ref 30.0–36.0)
MCV: 96.4 fL (ref 80.0–100.0)
Platelets: 327 K/uL (ref 150–400)
RBC: 3.09 MIL/uL — ABNORMAL LOW (ref 4.22–5.81)
RDW: 14.1 % (ref 11.5–15.5)
WBC: 10.6 K/uL — ABNORMAL HIGH (ref 4.0–10.5)
nRBC: 0 % (ref 0.0–0.2)

## 2024-04-25 LAB — CBG MONITORING, ED
Glucose-Capillary: 70 mg/dL (ref 70–99)
Glucose-Capillary: 179 mg/dL — ABNORMAL HIGH (ref 70–99)
Glucose-Capillary: 172 mg/dL — ABNORMAL HIGH (ref 70–99)

## 2024-04-25 LAB — GLUCOSE, CAPILLARY
Glucose-Capillary: 49 mg/dL — ABNORMAL LOW (ref 70–99)
Glucose-Capillary: 65 mg/dL — ABNORMAL LOW (ref 70–99)
Glucose-Capillary: 65 mg/dL — ABNORMAL LOW (ref 70–99)
Glucose-Capillary: 130 mg/dL — ABNORMAL HIGH (ref 70–99)

## 2024-04-25 MED ORDER — HYDROXYZINE HCL 25 MG PO TABS
25.0000 mg | ORAL_TABLET | Freq: Once | ORAL | Status: AC | PRN
  Administered 2024-04-25: 25 mg via ORAL
  Filled 2024-04-25: qty 1

## 2024-04-25 MED ORDER — METHYLPREDNISOLONE SODIUM SUCC 125 MG IJ SOLR: 60.0000 mg | Freq: Two times a day (BID) | INTRAMUSCULAR | Status: DC

## 2024-04-25 MED ORDER — METHYLPREDNISOLONE SODIUM SUCC 125 MG IJ SOLR
125.0000 mg | Freq: Once | INTRAMUSCULAR | Status: AC
  Administered 2024-04-25: 125 mg via INTRAVENOUS

## 2024-04-25 MED ORDER — GUAIFENESIN ER 600 MG PO TB12
600.0000 mg | ORAL_TABLET | Freq: Two times a day (BID) | ORAL | Status: DC
  Administered 2024-04-25 – 2024-04-27 (×5): 600 mg via ORAL
  Filled 2024-04-25 (×5): qty 1

## 2024-04-25 MED ORDER — METHYLPREDNISOLONE SODIUM SUCC 125 MG IJ SOLR
60.0000 mg | Freq: Two times a day (BID) | INTRAMUSCULAR | Status: DC
  Administered 2024-04-25 – 2024-04-27 (×3): 60 mg via INTRAVENOUS
  Filled 2024-04-25 (×4): qty 2

## 2024-04-25 MED ORDER — DEXTROSE 50 % IV SOLN
INTRAVENOUS | Status: AC
  Administered 2024-04-25: 50 mL
  Filled 2024-04-25: qty 50

## 2024-04-25 MED ORDER — METHYLPREDNISOLONE SODIUM SUCC 125 MG IJ SOLR
INTRAMUSCULAR | Status: AC
  Administered 2024-04-25: 125 mg
  Filled 2024-04-25: qty 2

## 2024-04-25 NOTE — ED Notes (Signed)
 Secure chat sent to provider that pt has been mouth breathing due to nasal congestion

## 2024-04-25 NOTE — ED Notes (Signed)
 Secure chat sent to provider regarding his labored breathing.

## 2024-04-25 NOTE — ED Notes (Signed)
 Pt was transferred to the hallway and was not placed on continuous vitals monitoring. Was Unable to find any monitors to hook patient up.

## 2024-04-25 NOTE — ED Notes (Signed)
 Verbal order received from provider to administer 125mg  IV solumedrol

## 2024-04-25 NOTE — ED Notes (Signed)
 Provider notified of pt extreme anxiety. Req something to help calm patient. See new orders.

## 2024-04-25 NOTE — Progress Notes (Signed)
 " PROGRESS NOTE    Jeffrey Randall.  FMW:[id number] DOB: 05-Jan-1941 DOA: 04/24/2024 PCP: Sheldon Netter, PA   Brief Narrative: This 84 y.o. male with medical history significant for chronic hypoxemic resp failure on 1-2 LNC with COPD, CKD 3b, HTN, DM2, hx. lung cancer s/p radiation, gout, chronic normocytic anemia who presented with sob initially seen in ED on 04/13/24 and treated for presumed COPD exac. Pt reported feeling acutely worse 2 days prior to this ED visit. Pt found to be FLU A+. CXR in ED with findings of increased bronchial thickening and opacity in LLL and probable bronchial thickening in RLL. Pt initially required up to [facility]. Pt was started on tamiflu .  Patient was admitted for further evaluation.  Assessment & Plan:   Principal Problem:   Influenza A  Acute on chronic hypoxic respiratory failure: Influenza A+ bronchitis History of Lung cancer s/p Radiation Therapy: CXR reviewed, notable for increased bronchial thickening and opacity in the inferior LLL He remains afebrile.  Found to be influenza A+ Continue Tamiflu  for 5 days. Typically on 1-2 L at baseline, needed up to 3L in ED. Marked wheezing with decreased air movement and increased resp effort on exam Continue prednisone  40 mg daily Continue with scheduled and as needed nebulized bronchodilators.   Hx of  Pulmonary Embolism: Diagnosed in 8/24 Continue eliquis  per home regimen   Hx COPD: Likely acute exacerbation due to bronchitis. Continue prednisone ,  neb tx as needed   CKD stage IV: Creatinine appears around baseline at time of presentation. Avoid nephrotoxic medications.   T2DM with Hyperglycemia: Continue basal/bolus and SSI.   History of BPH: Continue finasteride .   Chronic anemia Hgb trends appear stable Pt hemodynamically stable Recheck cbc in AM   Essential hypertension BP currently suboptimally controlled Continue Norvasc  10mg , Coreg  12.5mg , and diovan 320-25mg  PTA Cont home  regimen.   DVT prophylaxis: Eliquis  Code Status: Full code Family Communication: No family at bedside. Disposition Plan:  Status is: Inpatient Remains inpatient appropriate because: Patient admitted for acute on chronic hypoxic respiratory failure due to COPD exacerbation and influenza A.  Patient is not medically ready for discharge.   Consultants:  None  Procedures: None  Antimicrobials:  Anti-infectives (From admission, onward)    Start     Dose/Rate Route Frequency Ordered Stop   04/24/24 1530  oseltamivir  (TAMIFLU ) capsule 30 mg        30 mg Oral Daily 04/24/24 1520 04/29/24 0959      Subjective: Patient was seen and examined at bedside.  Overnight events noted. Patient remains on 3 L of supplemental oxygen ,  reports he is improving.   Denies any chest pain,  shortness of breath is improving.  Objective: Vitals:   04/25/24 0619 04/25/24 0700 04/25/24 0727 04/25/24 0948  BP:  (!) 157/70    Pulse:  (!) 53  71  Resp:  15  (!) 22  Temp:   98.1 F (36.7 C) 98.3 F (36.8 C)  TempSrc:   Oral Oral  SpO2:  100%  97%  Weight: 65.8 kg     Height: 5' 10 (1.778 m)      No intake or output data in the 24 hours ending 04/25/24 1008 Filed Weights   04/25/24 0619  Weight: 65.8 kg    Examination:  General exam: Appears calm and comfortable, not in any acute distress. Respiratory system: Clear to auscultation. Respiratory effort normal.  RR 14 Cardiovascular system: S1 & S2 heard, RRR. No JVD, murmurs, rubs, gallops  or clicks.  Gastrointestinal system: Abdomen is non distended, soft and non tender. Normal bowel sounds heard. Central nervous system: Alert and oriented x 3. No focal neurological deficits. Extremities: No edema, no cyanosis, no clubbing. Skin: No rashes, lesions or ulcers Psychiatry: Judgement and insight appear normal. Mood & affect appropriate.    Data Reviewed: I have personally reviewed following labs and imaging studies  CBC: Recent Labs  Lab  04/24/24 1104 04/24/24 1122 04/25/24 0430  WBC 12.2*  --  10.6*  NEUTROABS 9.9*  --   --   HGB 8.9* 8.8* 9.2*  HCT 28.2* 26.0* 29.8*  MCV 97.6  --  96.4  PLT 308  --  327   Basic Metabolic Panel: Recent Labs  Lab 04/24/24 1104 04/24/24 1122 04/25/24 0430  NA 143 141 142  K 4.2 4.0 4.0  CL 106  --  107  CO2 29  --  27  GLUCOSE 147*  --  124*  BUN 55*  --  50*  CREATININE 3.41*  --  3.15*  CALCIUM 8.5*  --  8.5*   GFR: Estimated Creatinine Clearance: 16.2 mL/min (A) (by C-G formula based on SCr of 3.15 mg/dL (H)). Liver Function Tests: Recent Labs  Lab 04/25/24 0430  AST 19  ALT 22  ALKPHOS 121  BILITOT <0.2  PROT 5.9*  ALBUMIN  2.9*   No results for input(s): LIPASE, AMYLASE in the last 168 hours. No results for input(s): AMMONIA in the last 168 hours. Coagulation Profile: No results for input(s): INR, PROTIME in the last 168 hours. Cardiac Enzymes: No results for input(s): CKTOTAL, CKMB, CKMBINDEX, TROPONINI in the last 168 hours. BNP (last 3 results) Recent Labs    04/13/24 1518 04/24/24 1104  PROBNP 3,516.0* 1,427.0*   HbA1C: No results for input(s): HGBA1C in the last 72 hours. CBG: Recent Labs  Lab 04/24/24 2225 04/25/24 0725  GLUCAP 172* 70   Lipid Profile: No results for input(s): CHOL, HDL, LDLCALC, TRIG, CHOLHDL, LDLDIRECT in the last 72 hours. Thyroid  Function Tests: No results for input(s): TSH, T4TOTAL, FREET4, T3FREE, THYROIDAB in the last 72 hours. Anemia Panel: No results for input(s): VITAMINB12, FOLATE, FERRITIN, TIBC, IRON, RETICCTPCT in the last 72 hours. Sepsis Labs: No results for input(s): PROCALCITON, LATICACIDVEN in the last 168 hours.  Recent Results (from the past 240 hours)  Resp panel by RT-PCR (RSV, Flu A&B, Covid) Anterior Nasal Swab     Status: Abnormal   Collection Time: 04/24/24 11:00 AM   Specimen: Anterior Nasal Swab  Result Value Ref Range Status    SARS Coronavirus 2 by RT PCR NEGATIVE NEGATIVE Final   Influenza A by PCR POSITIVE (A) NEGATIVE Final   Influenza B by PCR NEGATIVE NEGATIVE Final    Comment: (NOTE) The Xpert Xpress SARS-CoV-2/FLU/RSV plus assay is intended as an aid in the diagnosis of influenza from Nasopharyngeal swab specimens and should not be used as a sole basis for treatment. Nasal washings and aspirates are unacceptable for Xpert Xpress SARS-CoV-2/FLU/RSV testing.  Fact Sheet for Patients: bloggercourse.com  Fact Sheet for Healthcare Providers: seriousbroker.it  This test is not yet approved or cleared by the [place]  FDA and has been authorized for detection and/or diagnosis of SARS-CoV-2 by FDA under an Emergency Use Authorization (EUA). This EUA will remain in effect (meaning this test can be used) for the duration of the COVID-19 declaration under Section 564(b)(1) of the Act, 21 U.S.C. section 360bbb-3(b)(1), unless the authorization is terminated or revoked.  Resp Syncytial Virus by PCR NEGATIVE NEGATIVE Final    Comment: (NOTE) Fact Sheet for Patients: bloggercourse.com  Fact Sheet for Healthcare Providers: seriousbroker.it  This test is not yet approved or cleared by the [place]  FDA and has been authorized for detection and/or diagnosis of SARS-CoV-2 by FDA under an Emergency Use Authorization (EUA). This EUA will remain in effect (meaning this test can be used) for the duration of the COVID-19 declaration under Section 564(b)(1) of the Act, 21 U.S.C. section 360bbb-3(b)(1), unless the authorization is terminated or revoked.  Performed at [facility], [address] [place], [place] [address]     Radiology Studies: DG Chest 2 View Result Date: 04/24/2024 CLINICAL DATA:  Cough, shortness of breath, fatigue and COPD. EXAM: CHEST - 2 VIEW COMPARISON:  04/13/2024  FINDINGS: The heart size and mediastinal contours are within normal limits. Suggestion of potentially increased bronchial thickening and opacity in the inferior left lower lobe which may be consistent with acute bronchitis or pneumonia. Probable bronchial thickening and mucous plugging in the right lower lobe. Stable opacity and scarring at the right lung apex. Stable advanced emphysematous lung disease. The visualized skeletal structures are unremarkable. IMPRESSION: Suggestion of increased bronchial thickening and opacity in the inferior left lower lobe which may be consistent with acute bronchitis or pneumonia. Probable bronchial thickening and mucous plugging in the right lower lobe. Electronically Signed   By: Marcey Moan M.D.   On: 04/24/2024 12:01    Scheduled Meds:  amLODipine   10 mg Oral Daily   apixaban   5 mg Oral BID   carvedilol   12.5 mg Oral BID WC   finasteride   5 mg Oral Daily   insulin  aspart  0-20 Units Subcutaneous TID WC   insulin  aspart  0-5 Units Subcutaneous QHS   insulin  aspart  5 Units Subcutaneous TID WC   insulin  glargine  25 Units Subcutaneous Daily   irbesartan   300 mg Oral Daily   oseltamivir   30 mg Oral Daily   predniSONE   40 mg Oral Q breakfast   simvastatin   10 mg Oral q1800   tamsulosin   0.8 mg Oral QHS   Continuous Infusions:   LOS: 1 day    Time spent: 50 mins    Darcel Dawley, MD [facility]   If 7PM-7AM, please contact night-coverage  "

## 2024-04-25 NOTE — Plan of Care (Signed)

## 2024-04-25 NOTE — ED Notes (Signed)
 Notified provider pt continues to desat after breathing tx. Provider req to continue with PRN breathing tx for now

## 2024-04-26 ENCOUNTER — Encounter (HOSPITAL_COMMUNITY): Payer: Self-pay | Admitting: Internal Medicine

## 2024-04-26 LAB — GLUCOSE, CAPILLARY
Glucose-Capillary: 377 mg/dL — ABNORMAL HIGH (ref 70–99)
Glucose-Capillary: 384 mg/dL — ABNORMAL HIGH (ref 70–99)
Glucose-Capillary: 241 mg/dL — ABNORMAL HIGH (ref 70–99)
Glucose-Capillary: 108 mg/dL — ABNORMAL HIGH (ref 70–99)
Glucose-Capillary: 151 mg/dL — ABNORMAL HIGH (ref 70–99)

## 2024-04-26 MED ORDER — IPRATROPIUM-ALBUTEROL 0.5-2.5 (3) MG/3ML IN SOLN
3.0000 mL | Freq: Two times a day (BID) | RESPIRATORY_TRACT | Status: DC
  Administered 2024-04-26 – 2024-04-27 (×2): 3 mL via RESPIRATORY_TRACT
  Filled 2024-04-26 (×2): qty 3

## 2024-04-26 NOTE — TOC CM/SW Note (Signed)
 Transition of Care [facility]) - Inpatient Brief Assessment   Patient Details  Name: Cyree Chuong. MRN: [id number] Date of Birth: 05/02/1940  Transition of Care [facility]) CM/SW Contact:    Tom-Johnson, Harvest Muskrat, RN Phone Number: 04/26/2024, 10:25 AM   Clinical Narrative:  Patient presented to the ED with worsening Shortness of Breath and Fatigue. Patient is found to be Influenza A+. Chest X-ray showed increased Bronchial thickening and Opacity in LLL and probable Bronchial thickening in RLL. Patient was started on Tamiflu . Has hx of Chronic Hypoxic Respiratory Failure,  COPD stage IV on 2L O2 baseline from [facility] , CKD 3, HTN, DM, Lung Cancer s/p Radiation, Anemia, HLD, PE on Eliquis .    CM spoke with patient at bedside and son, Buren via phone [phone]) about needs for post hospital transition. Patient lives with his son Buren, has two children and a supportive brother. Retired, independent with care and drive self prior to admission. Does not have DME's at home.  PCP is Sheldon Netter, PA and uses [facility] on [address] in [place].    No ICM needs or recommendations noted at this time.  Patient not Medically ready for discharge.  CM will continue to follow as patient progresses with care towards discharge.       Transition of Care Asessment: Insurance and Status: Insurance coverage has been reviewed Patient has primary care physician: Yes Home environment has been reviewed: Yes Prior level of function:: Independent Prior/Current Home Services: No current home services Social Drivers of Health Review: SDOH reviewed no interventions necessary Readmission risk has been reviewed: Yes Transition of care needs: no transition of care needs at this time

## 2024-04-26 NOTE — Inpatient Diabetes Management (Signed)
 Inpatient Diabetes Program Recommendations  AACE/ADA: New Consensus Statement on Inpatient Glycemic Control (2015)  Target Ranges:  Prepandial:   less than 140 mg/dL      Peak postprandial:   less than 180 mg/dL (1-2 hours)      Critically ill patients:  140 - 180 mg/dL   Lab Results  Component Value Date   GLUCAP 384 (H) 04/26/2024   HGBA1C 11.2 (H) 12/02/2023    Review of Glycemic Control  Latest Reference Range & Units 04/25/24 17:54 04/25/24 18:22 04/25/24 18:40 04/25/24 19:47 04/26/24 06:52 04/26/24 07:45  Glucose-Capillary 70 - 99 mg/dL 65 (L) 49 (L) 65 (L) 869 (H) 377 (H) 384 (H)   Diabetes history: Type 2 DM Outpatient Diabetes medications: Humalog  5 units TID, Tresiba  20 units every day, Farxiga 10 mg QD Current orders for Inpatient glycemic control: Novolog  0-20 units TID, Novolog  5 units TID, Novolog  0-5 units at bedtime, Lantus  25 units every day Solumedrol 80 mg BID  Inpatient Diabetes Program Recommendations:    Noted hypoglycemia. With current GFR and age, may want to reduce correction to Novolog  0-9 units TID.  Thanks, Tinnie Minus, MSN, RNC-OB Diabetes Coordinator [phone] (8a-5p)

## 2024-04-26 NOTE — Care Management Obs Status (Signed)
 MEDICARE OBSERVATION STATUS NOTIFICATION   Patient Details  Name: Jeffrey Randall. MRN: [id number] Date of Birth: 11-27-1940   Medicare Observation Status Notification Given:  Yes  Verbally reviewed observation notice with Jeffrey Randall telephonically at [phone].  Will delivery a copy to the patient room.  Jeffrey Randall 04/26/2024, 1:38 PM

## 2024-04-26 NOTE — Progress Notes (Signed)
 " PROGRESS NOTE    Jeffrey Randall.  FMW:[id number] DOB: 04/18/1940 DOA: 04/24/2024 PCP: Sheldon Netter, PA   Brief Narrative: This 84 y.o. male with medical history significant for chronic hypoxemic resp failure on 1-2 LNC with COPD, CKD 3b, HTN, DM2, hx. lung cancer s/p radiation, gout, chronic normocytic anemia who presented with sob initially seen in ED on 04/13/24 and treated for presumed COPD exac. Pt reported feeling acutely worse 2 days prior to this ED visit. Pt found to be FLU A+. CXR in ED with findings of increased bronchial thickening and opacity in LLL and probable bronchial thickening in RLL. Pt initially required up to [facility]. Pt was started on tamiflu .  Patient was admitted for further evaluation.  Assessment & Plan:   Principal Problem:   Influenza A  Acute on chronic hypoxic respiratory failure: Influenza A+ bronchitis,  History of Lung cancer s/p Radiation Therapy: CXR reviewed, notable for increased bronchial thickening and opacity in the inferior LLL He remains afebrile.  Found to be influenza A+ Continue Tamiflu  for 5 days. Typically on 1-2 L at baseline, needed up to 3L in ED. Marked wheezing with decreased air movement and increased resp effort on exam Continue Solu-Medrol  60 mg every 12 hr. Continue with scheduled and as needed nebulized bronchodilators. Will change to prednisone  tomorrow.   Hx of  Pulmonary Embolism: Diagnosed in 8/24 Continue eliquis  per home regimen.   Hx COPD: Likely acute exacerbation due to bronchitis. Continue solumedrol,  neb tx as needed.   CKD stage IV: Creatinine appears around baseline at time of presentation. Avoid nephrotoxic medications.   T2DM with Hypoglycemia: Continue basal/bolus and SSI. Continue hypoglycemia protocol. Diabetic coordinator consulted.   History of BPH: Continue finasteride .   Chronic anemia Hgb trends appear stable. Pt hemodynamically stable. Recheck cbc in AM   Essential hypertension BP  currently suboptimally controlled. Continue Norvasc  10mg , Coreg  12.5mg , and diovan 320-25mg  PTA Cont home regimen.   DVT prophylaxis: Eliquis  Code Status: Full code Family Communication: No family at bedside. Disposition Plan:  Status is: Inpatient Remains inpatient appropriate because: Patient admitted for acute on chronic hypoxic respiratory failure due to COPD exacerbation and influenza A.  Patient is not medically ready for discharge.   Consultants:  None  Procedures: None  Antimicrobials:  Anti-infectives (From admission, onward)    Start     Dose/Rate Route Frequency Ordered Stop   04/24/24 1530  oseltamivir  (TAMIFLU ) capsule 30 mg        30 mg Oral Daily 04/24/24 1520 04/29/24 0959      Subjective: Patient was seen and examined at bedside.  Overnight events noted. Patient remains on 3 L of supplemental oxygen , reports he has much improved. Denies any chest pain,  shortness of breath is improving.  Objective: Vitals:   04/25/24 1612 04/25/24 2211 04/26/24 0508 04/26/24 0749  BP: (!) 156/70 (!) 154/83 (!) 152/72 (!) 152/70  Pulse: 62 65 85 73  Resp: (!) 22 18 16 18   Temp: 98.6 F (37 C) 98 F (36.7 C)  98.9 F (37.2 C)  TempSrc: Oral     SpO2: 99% 97% 95% 99%  Weight:      Height:        Intake/Output Summary (Last 24 hours) at 04/26/2024 1136 Last data filed at 04/26/2024 0749 Gross per 24 hour  Intake 240 ml  Output 0 ml  Net 240 ml   Filed Weights   04/25/24 0619  Weight: 65.8 kg    Examination:  General exam: Appears calm and comfortable, not in any acute distress. Respiratory system: CTA bilaterally . Respiratory effort normal.  RR 15 Cardiovascular system: S1 & S2 heard, RRR. No JVD, murmurs, rubs, gallops or clicks.  Gastrointestinal system: Abdomen is non distended, soft and non tender. Normal bowel sounds heard. Central nervous system: Alert and oriented x 3. No focal neurological deficits. Extremities: No edema, no cyanosis, no  clubbing. Skin: No rashes, lesions or ulcers Psychiatry: Judgement and insight appear normal. Mood & affect appropriate.    Data Reviewed: I have personally reviewed following labs and imaging studies  CBC: Recent Labs  Lab 04/24/24 1104 04/24/24 1122 04/25/24 0430  WBC 12.2*  --  10.6*  NEUTROABS 9.9*  --   --   HGB 8.9* 8.8* 9.2*  HCT 28.2* 26.0* 29.8*  MCV 97.6  --  96.4  PLT 308  --  327   Basic Metabolic Panel: Recent Labs  Lab 04/24/24 1104 04/24/24 1122 04/25/24 0430  NA 143 141 142  K 4.2 4.0 4.0  CL 106  --  107  CO2 29  --  27  GLUCOSE 147*  --  124*  BUN 55*  --  50*  CREATININE 3.41*  --  3.15*  CALCIUM 8.5*  --  8.5*   GFR: Estimated Creatinine Clearance: 16.2 mL/min (A) (by C-G formula based on SCr of 3.15 mg/dL (H)). Liver Function Tests: Recent Labs  Lab 04/25/24 0430  AST 19  ALT 22  ALKPHOS 121  BILITOT <0.2  PROT 5.9*  ALBUMIN  2.9*   No results for input(s): LIPASE, AMYLASE in the last 168 hours. No results for input(s): AMMONIA in the last 168 hours. Coagulation Profile: No results for input(s): INR, PROTIME in the last 168 hours. Cardiac Enzymes: No results for input(s): CKTOTAL, CKMB, CKMBINDEX, TROPONINI in the last 168 hours. BNP (last 3 results) Recent Labs    04/13/24 1518 04/24/24 1104  PROBNP 3,516.0* 1,427.0*   HbA1C: No results for input(s): HGBA1C in the last 72 hours. CBG: Recent Labs  Lab 04/25/24 1840 04/25/24 1947 04/26/24 0652 04/26/24 0745 04/26/24 1129  GLUCAP 65* 130* 377* 384* 241*   Lipid Profile: No results for input(s): CHOL, HDL, LDLCALC, TRIG, CHOLHDL, LDLDIRECT in the last 72 hours. Thyroid  Function Tests: No results for input(s): TSH, T4TOTAL, FREET4, T3FREE, THYROIDAB in the last 72 hours. Anemia Panel: No results for input(s): VITAMINB12, FOLATE, FERRITIN, TIBC, IRON, RETICCTPCT in the last 72 hours. Sepsis Labs: No results for  input(s): PROCALCITON, LATICACIDVEN in the last 168 hours.  Recent Results (from the past 240 hours)  Resp panel by RT-PCR (RSV, Flu A&B, Covid) Anterior Nasal Swab     Status: Abnormal   Collection Time: 04/24/24 11:00 AM   Specimen: Anterior Nasal Swab  Result Value Ref Range Status   SARS Coronavirus 2 by RT PCR NEGATIVE NEGATIVE Final   Influenza A by PCR POSITIVE (A) NEGATIVE Final   Influenza B by PCR NEGATIVE NEGATIVE Final    Comment: (NOTE) The Xpert Xpress SARS-CoV-2/FLU/RSV plus assay is intended as an aid in the diagnosis of influenza from Nasopharyngeal swab specimens and should not be used as a sole basis for treatment. Nasal washings and aspirates are unacceptable for Xpert Xpress SARS-CoV-2/FLU/RSV testing.  Fact Sheet for Patients: bloggercourse.com  Fact Sheet for Healthcare Providers: seriousbroker.it  This test is not yet approved or cleared by the [place]  FDA and has been authorized for detection and/or diagnosis of SARS-CoV-2 by FDA under an Emergency  Use Authorization (EUA). This EUA will remain in effect (meaning this test can be used) for the duration of the COVID-19 declaration under Section 564(b)(1) of the Act, 21 U.S.C. section 360bbb-3(b)(1), unless the authorization is terminated or revoked.     Resp Syncytial Virus by PCR NEGATIVE NEGATIVE Final    Comment: (NOTE) Fact Sheet for Patients: bloggercourse.com  Fact Sheet for Healthcare Providers: seriousbroker.it  This test is not yet approved or cleared by the [place]  FDA and has been authorized for detection and/or diagnosis of SARS-CoV-2 by FDA under an Emergency Use Authorization (EUA). This EUA will remain in effect (meaning this test can be used) for the duration of the COVID-19 declaration under Section 564(b)(1) of the Act, 21 U.S.C. section 360bbb-3(b)(1), unless the  authorization is terminated or revoked.  Performed at [facility], [address], [place], [place] [address]     Radiology Studies: DG CHEST PORT 1 VIEW Result Date: 04/25/2024 CLINICAL DATA:  Shortness of breath. EXAM: PORTABLE CHEST 1 VIEW COMPARISON:  04/24/2024, 04/13/2024, 11/29/2023 FINDINGS: Subtle elevation of the left hemidiaphragm with minimal streaky linear density left base compatible with scarring. Stable scarring right apex. No acute airspace process or effusion. Cardiomediastinal silhouette and remainder of the exam is unchanged. IMPRESSION: No acute cardiopulmonary disease. Electronically Signed   By: Toribio Agreste M.D.   On: 04/25/2024 16:03    Scheduled Meds:  amLODipine   10 mg Oral Daily   apixaban   5 mg Oral BID   carvedilol   12.5 mg Oral BID WC   finasteride   5 mg Oral Daily   guaiFENesin   600 mg Oral BID   insulin  aspart  0-20 Units Subcutaneous TID WC   insulin  aspart  0-5 Units Subcutaneous QHS   insulin  aspart  5 Units Subcutaneous TID WC   insulin  glargine  25 Units Subcutaneous Daily   irbesartan   300 mg Oral Daily   methylPREDNISolone  (SOLU-MEDROL ) injection  60 mg Intravenous Q12H   oseltamivir   30 mg Oral Daily   simvastatin   10 mg Oral q1800   tamsulosin   0.8 mg Oral QHS   Continuous Infusions:   LOS: 1 day    Time spent: 35 mins    Darcel Dawley, MD [facility]   If 7PM-7AM, please contact night-coverage  "

## 2024-04-27 DIAGNOSIS — J101 Influenza due to other identified influenza virus with other respiratory manifestations: Secondary | ICD-10-CM | POA: Diagnosis not present

## 2024-04-27 LAB — CBC
HCT: 24.8 % — ABNORMAL LOW (ref 39.0–52.0)
Hemoglobin: 8.1 g/dL — ABNORMAL LOW (ref 13.0–17.0)
MCH: 30.9 pg (ref 26.0–34.0)
MCHC: 32.7 g/dL (ref 30.0–36.0)
MCV: 94.7 fL (ref 80.0–100.0)
Platelets: 298 K/uL (ref 150–400)
RBC: 2.62 MIL/uL — ABNORMAL LOW (ref 4.22–5.81)
RDW: 14.1 % (ref 11.5–15.5)
WBC: 17.7 K/uL — ABNORMAL HIGH (ref 4.0–10.5)
nRBC: 0 % (ref 0.0–0.2)

## 2024-04-27 LAB — BASIC METABOLIC PANEL WITH GFR
Anion gap: 11 (ref 5–15)
BUN: 66 mg/dL — ABNORMAL HIGH (ref 8–23)
CO2: 24 mmol/L (ref 22–32)
Calcium: 8.5 mg/dL — ABNORMAL LOW (ref 8.9–10.3)
Chloride: 102 mmol/L (ref 98–111)
Creatinine, Ser: 3.64 mg/dL — ABNORMAL HIGH (ref 0.61–1.24)
GFR, Estimated: 16 mL/min — ABNORMAL LOW
Glucose, Bld: 115 mg/dL — ABNORMAL HIGH (ref 70–99)
Potassium: 3.7 mmol/L (ref 3.5–5.1)
Sodium: 136 mmol/L (ref 135–145)

## 2024-04-27 LAB — MAGNESIUM: Magnesium: 2.3 mg/dL (ref 1.7–2.4)

## 2024-04-27 LAB — HEMOGLOBIN AND HEMATOCRIT, BLOOD
HCT: 25.8 % — ABNORMAL LOW (ref 39.0–52.0)
Hemoglobin: 8.4 g/dL — ABNORMAL LOW (ref 13.0–17.0)

## 2024-04-27 LAB — GLUCOSE, CAPILLARY
Glucose-Capillary: 116 mg/dL — ABNORMAL HIGH (ref 70–99)
Glucose-Capillary: 167 mg/dL — ABNORMAL HIGH (ref 70–99)

## 2024-04-27 LAB — PHOSPHORUS: Phosphorus: 4.7 mg/dL — ABNORMAL HIGH (ref 2.5–4.6)

## 2024-04-27 MED ORDER — OSELTAMIVIR PHOSPHATE 30 MG PO CAPS: 30.0000 mg | ORAL_CAPSULE | Freq: Every day | ORAL | 0 refills | Status: AC

## 2024-04-27 MED ORDER — PREDNISONE 20 MG PO TABS: 40.0000 mg | ORAL_TABLET | Freq: Every day | ORAL | Status: DC

## 2024-04-27 MED ORDER — AMLODIPINE BESYLATE 10 MG PO TABS: 10.0000 mg | ORAL_TABLET | Freq: Every day | ORAL | 0 refills | Status: AC

## 2024-04-27 MED ORDER — PREDNISONE 20 MG PO TABS: 40.0000 mg | ORAL_TABLET | Freq: Every day | ORAL | 0 refills | Status: AC

## 2024-04-27 MED ORDER — CARVEDILOL 12.5 MG PO TABS: 12.5000 mg | ORAL_TABLET | Freq: Two times a day (BID) | ORAL | 0 refills | Status: AC

## 2024-04-27 NOTE — Discharge Summary (Signed)
 Physician Discharge Summary  [name]. FMW:[id number] DOB: [birth year] DOA: 04/24/2024  PCP: Jeffrey Netter, PA  Admit date: 04/24/2024  Discharge date: 04/27/2024  Admitted From: Home  Disposition: Home  Recommendations for Outpatient Follow-up:  Follow up with PCP in 1-2 weeks. Please obtain BMP/CBC in one week. Advised to take Tamiflu  75 mg twice daily for 3 more days. Advised to take prednisone  40 mg daily for 3 more days.  Home Health: None Equipment/Devices: Home Oxygen  @ 3L/min  Discharge Condition: Stable CODE STATUS:Full code Diet recommendation: Carb Modified  Brief Summary/ Hospital Course: This 84 y.o. male with medical history significant for chronic hypoxemic resp failure on 1-2 LNC with COPD, CKD 3b, HTN, DM2, hx. lung cancer s/p radiation, gout, chronic normocytic anemia who presented with sob initially seen in ED on 04/13/24 and treated for presumed COPD exac. Pt reported feeling acutely worse 2 days prior to this ED visit. Patient found to be FLU A+. CXR in ED with findings of increased bronchial thickening and opacity in LLL and probable bronchial thickening in RLL. Patient initially required up to [facility]. Pt was started on tamiflu .  Patient was admitted for further evaluation.  Patient has made significant improvement.  Wheezing has resolved.  Patient feels much improved and he wants to be discharged home.  Discharge Diagnoses:  Principal Problem:   Influenza A  Acute on chronic hypoxic respiratory failure: Influenza A+ bronchitis,  History of Lung cancer s/p Radiation Therapy: CXR reviewed, notable for increased bronchial thickening and opacity in the inferior LLL He remains afebrile.  Found to be influenza A+ Continue Tamiflu  for 5 days. Typically on 1-2 L at baseline, needed up to 3L in ED. Marked wheezing with decreased air movement and increased resp effort on exam Continue Solu-Medrol  60 mg every 12 hr. Continue with scheduled and as needed  nebulized bronchodilators. Wheezing resolved.  Changed to prednisone .   Hx of  Pulmonary Embolism: Diagnosed in 8/24 Continue eliquis  per home regimen.   Hx COPD: Likely acute exacerbation due to bronchitis. Continue solumedrol,  neb tx as needed.   CKD stage IV: Creatinine appears around baseline at time of presentation. Avoid nephrotoxic medications.   T2DM with Hypoglycemia: Continue basal/bolus and SSI. Continue hypoglycemia protocol. Diabetic coordinator consulted.   History of BPH: Continue finasteride .   Chronic anemia Hgb trends appear stable. Pt hemodynamically stable. Recheck cbc in AM   Essential hypertension BP currently suboptimally controlled. Continue Norvasc  10mg , Coreg  12.5mg , and diovan 320-25mg  PTA Cont home regimen.  Discharge Instructions  Discharge Instructions     Call MD for:  difficulty breathing, headache or visual disturbances   Complete by: As directed    Call MD for:  persistant nausea and vomiting   Complete by: As directed    Diet Carb Modified   Complete by: As directed    Discharge instructions   Complete by: As directed    Advised to follow-up with primary care physician in 1 week. Advised to take Tamiflu  75 mg twice daily for 3 more days. Advised to take prednisone  40 mg daily for 3 more days.   Increase activity slowly   Complete by: As directed       Allergies as of 04/27/2024       Reactions   Chantix  Continuing Month [varenicline ] Other (See Comments)   Doesn't work for patient   Januvia [sitagliptin] Other (See Comments)   Class Contraindicated   Lisinopril Swelling   Possible tongue swelling. ARB's okay   Motrin [  ibuprofen] Other (See Comments)   Contraindicated by Renal disease   Remeron [mirtazapine] Other (See Comments)   Excess somnolence on the 15 mg dose   Penicillins Other (See Comments)   Facial swelling        Medication List     STOP taking these medications    doxycycline  100 MG  capsule Commonly known as: VIBRAMYCIN    HumaLOG  KwikPen 100 UNIT/ML KwikPen Generic drug: insulin  lispro   valsartan-hydrochlorothiazide  320-25 MG tablet Commonly known as: DIOVAN-HCT       TAKE these medications    ADVIL COLD/SINUS PO Take 1 Dose by mouth every 6 (six) hours as needed (cold/sinus).   albuterol  108 (90 Base) MCG/ACT inhaler Commonly known as: VENTOLIN  HFA Inhale 1-2 puffs into the lungs every 6 (six) hours as needed for wheezing or shortness of breath.   ALKA-SELTZER PLS ALLERGY & CGH PO Take 1 Dose by mouth every 4 (four) hours as needed (cold/cough).   allopurinol 100 MG tablet Commonly known as: ZYLOPRIM Take 100 mg by mouth every morning.   amLODipine  10 MG tablet Commonly known as: NORVASC  Take 1 tablet (10 mg total) by mouth daily. Start taking on: April 28, 2024 What changed:  how much to take when to take this   apixaban  5 MG Tabs tablet Commonly known as: ELIQUIS  Take 1 tablet (5 mg total) by mouth 2 (two) times daily. Start this once the eliquis  starter pack is complete.   carvedilol  12.5 MG tablet Commonly known as: COREG  Take 1 tablet (12.5 mg total) by mouth 2 (two) times daily with a meal. What changed: when to take this   DAYQUIL MULTI-SYMPTOM COLD/FLU PO Take 1 Dose by mouth every 6 (six) hours as needed (cold/cough).   Farxiga 10 MG Tabs tablet Generic drug: dapagliflozin propanediol Take 10 mg by mouth daily.   finasteride  5 MG tablet Commonly known as: PROSCAR  Take 5 mg by mouth daily.   Fish Oil 1200 MG Caps Take 1,200 mg by mouth daily.   guaifenesin  100 MG/5ML syrup Commonly known as: ROBITUSSIN Take 200 mg by mouth 3 (three) times daily as needed for cough.   insulin  degludec 100 UNIT/ML FlexTouch Pen Commonly known as: TRESIBA  Inject 25 Units into the skin daily. What changed: how much to take   ipratropium-albuterol  0.5-2.5 (3) MG/3ML Soln Commonly known as: DUONEB Take 3 mLs by nebulization every 4  (four) hours as needed (Wheezing, SOB).   multivitamin with minerals tablet Take 1 tablet by mouth daily.   NYQUIL COLD & FLU PO Take 1 Dose by mouth every 6 (six) hours as needed (cold/cough).   oseltamivir  30 MG capsule Commonly known as: TAMIFLU  Take 1 capsule (30 mg total) by mouth daily for 2 days. Start taking on: April 28, 2024   predniSONE  20 MG tablet Commonly known as: DELTASONE  Take 2 tablets (40 mg total) by mouth daily with breakfast for 3 days. Start taking on: April 28, 2024 What changed:  medication strength how much to take how to take this when to take this additional instructions   SAMBUCOL BLACK ELDERBERRY PO Take 1 Dose by mouth daily as needed (immune support).   simvastatin  10 MG tablet Commonly known as: ZOCOR  Take 10 mg by mouth at bedtime.   tamsulosin  0.4 MG Caps capsule Commonly known as: FLOMAX  Take 0.8 mg by mouth at bedtime.   Trelegy Ellipta  200-62.5-25 MCG/ACT Aepb Generic drug: Fluticasone -Umeclidin-Vilant Inhale 1 puff into the lungs daily.   Vitamin B-12 CR 1500 MCG  Tbcr Take 1,500 mcg by mouth daily.        Follow-up Information     Jeffrey Randall, [place] Follow up in 1 week(s).   Specialty: Physician Assistant Contact information: [address] [place] [address] [place] [address] [phone]                Allergies[1]  Consultations: None   Procedures/Studies: DG CHEST PORT 1 VIEW Result Date: 04/25/2024 CLINICAL DATA:  Shortness of breath. EXAM: PORTABLE CHEST 1 VIEW COMPARISON:  04/24/2024, 04/13/2024, 11/29/2023 FINDINGS: Subtle elevation of the left hemidiaphragm with minimal streaky linear density left base compatible with scarring. Stable scarring right apex. No acute airspace process or effusion. Cardiomediastinal silhouette and remainder of the exam is unchanged. IMPRESSION: No acute cardiopulmonary disease. Electronically Signed   By: Toribio Agreste M.D.   On: 04/25/2024 16:03   DG Chest 2  View Result Date: 04/24/2024 CLINICAL DATA:  Cough, shortness of breath, fatigue and COPD. EXAM: CHEST - 2 VIEW COMPARISON:  04/13/2024 FINDINGS: The heart size and mediastinal contours are within normal limits. Suggestion of potentially increased bronchial thickening and opacity in the inferior left lower lobe which may be consistent with acute bronchitis or pneumonia. Probable bronchial thickening and mucous plugging in the right lower lobe. Stable opacity and scarring at the right lung apex. Stable advanced emphysematous lung disease. The visualized skeletal structures are unremarkable. IMPRESSION: Suggestion of increased bronchial thickening and opacity in the inferior left lower lobe which may be consistent with acute bronchitis or pneumonia. Probable bronchial thickening and mucous plugging in the right lower lobe. Electronically Signed   By: Marcey Moan M.D.   On: 04/24/2024 12:01   CT Chest Wo Contrast Result Date: 04/13/2024 EXAM: CT CHEST WITHOUT CONTRAST 04/13/2024 09:45:00 PM TECHNIQUE: CT of the chest was performed without the administration of intravenous contrast. Multiplanar reformatted images are provided for review. Automated exposure control, iterative reconstruction, and/or weight based adjustment of the mA/kV was utilized to reduce the radiation dose to as low as reasonably achievable. COMPARISON: 03/24/2016, 10/27/2020 CLINICAL HISTORY: Abnormal xray - lung nodule, >= 1 cm. FINDINGS: MEDIASTINUM: Heart and pericardium are unremarkable. The central airways are clear. Calcific aortic atherosclerosis and coronary artery calcification. LYMPH NODES: No mediastinal, hilar or axillary lymphadenopathy. LUNGS AND PLEURA: Masslike opacity at the right lung apex measuring 2.9 x 2.4 cm with surrounding architectural distortion. This is continuous with areas of apical pleuroparenchymal scarring. No pulmonary edema. No pleural effusion or pneumothorax. SOFT TISSUES/BONES: No acute abnormality of the  bones or soft tissues. UPPER ABDOMEN: Limited images of the upper abdomen demonstrates a left adrenal mass measuring 1.6 x 2.8 cm with attenuation of 12 HU. IMPRESSION: 1. Masslike opacity at the right lung apex measuring 2.9 x 2.4 cm with surrounding architectural distortion. There is likely a component of superimposed radiation fibrosis. Correlation with PET/CT and comparison to prior PET/CT from 10/27/20 may be helpful in determining what part of this, if any, is neoplastic. 2. Left adrenal mass measuring 1.6 x 2.8 cm with attenuation of 12 HU, increased in size since 2022. Consider further evaluation with MRI or adrenal protocol CT on a non-emergent basis. Electronically signed by: Franky Stanford MD 04/13/2024 10:54 PM EST RP Workstation: [id number]   DG Chest 2 View Result Date: 04/13/2024 CLINICAL DATA:  Shortness of breath EXAM: CHEST - 2 VIEW COMPARISON:  12/02/2023, 07/05/2023, 06/08/2021, 06/28/2023, PET CT 10/27/2020 FINDINGS: Emphysema. Chronic pleuroparenchymal scarring in the left lower lung. No acute airspace disease, pleural effusion  or pneumothorax. Irregular pleuroparenchymal opacity at the right apex, appears slightly progressive compared with March examinations. Stable cardiomediastinal silhouette with aortic atherosclerosis IMPRESSION: 1. No active cardiopulmonary disease. Emphysema. 2. Irregular pleuroparenchymal opacity at the right apex, appears slightly progressive compared with March examinations. Recommend correlation with chest CT. Electronically Signed   By: Luke Bun M.D.   On: 04/13/2024 17:23    Subjective: Patient was seen and examined at bedside.  Overnight events noted. Patient reports feeling much improved and want to be discharged home.  Discharge Exam: Vitals:   04/27/24 0736 04/27/24 0820  BP: 126/64 (!) 158/66  Pulse: 71 62  Resp: 18   Temp: 98.7 F (37.1 C) 97.6 F (36.4 C)  SpO2: 96% 100%   Vitals:   04/26/24 2049 04/27/24 0530 04/27/24 0736 04/27/24  0820  BP:  (!) 148/59 126/64 (!) 158/66  Pulse: 73 77 71 62  Resp: 16 20 18    Temp:  98 F (36.7 C) 98.7 F (37.1 C) 97.6 F (36.4 C)  TempSrc:  Oral Oral Oral  SpO2: 98% 97% 96% 100%  Weight:      Height:        General: Pt is alert, awake, not in acute distress Cardiovascular: RRR, S1/S2 +, no rubs, no gallops Respiratory: CTA bilaterally, no wheezing, no rhonchi Abdominal: Soft, NT, ND, bowel sounds + Extremities: no edema, no cyanosis    The results of significant diagnostics from this hospitalization (including imaging, microbiology, ancillary and laboratory) are listed below for reference.     Microbiology: Recent Results (from the past 240 hours)  Resp panel by RT-PCR (RSV, Flu A&B, Covid) Anterior Nasal Swab     Status: Abnormal   Collection Time: 04/24/24 11:00 AM   Specimen: Anterior Nasal Swab  Result Value Ref Range Status   SARS Coronavirus 2 by RT PCR NEGATIVE NEGATIVE Final   Influenza A by PCR POSITIVE (A) NEGATIVE Final   Influenza B by PCR NEGATIVE NEGATIVE Final    Comment: (NOTE) The Xpert Xpress SARS-CoV-2/FLU/RSV plus assay is intended as an aid in the diagnosis of influenza from Nasopharyngeal swab specimens and should not be used as a sole basis for treatment. Nasal washings and aspirates are unacceptable for Xpert Xpress SARS-CoV-2/FLU/RSV testing.  Fact Sheet for Patients: bloggercourse.com  Fact Sheet for Healthcare Providers: seriousbroker.it  This test is not yet approved or cleared by the [place]  FDA and has been authorized for detection and/or diagnosis of SARS-CoV-2 by FDA under an Emergency Use Authorization (EUA). This EUA will remain in effect (meaning this test can be used) for the duration of the COVID-19 declaration under Section 564(b)(1) of the Act, 21 U.S.C. section 360bbb-3(b)(1), unless the authorization is terminated or revoked.     Resp Syncytial Virus by PCR  NEGATIVE NEGATIVE Final    Comment: (NOTE) Fact Sheet for Patients: bloggercourse.com  Fact Sheet for Healthcare Providers: seriousbroker.it  This test is not yet approved or cleared by the [place]  FDA and has been authorized for detection and/or diagnosis of SARS-CoV-2 by FDA under an Emergency Use Authorization (EUA). This EUA will remain in effect (meaning this test can be used) for the duration of the COVID-19 declaration under Section 564(b)(1) of the Act, 21 U.S.C. section 360bbb-3(b)(1), unless the authorization is terminated or revoked.  Performed at [facility], [address], [place], [place] [address]      Labs: BNP (last 3 results) Recent Labs    06/28/23 1335 07/02/23 0259 11/29/23 1855  BNP 116.4* 190.9* 164.4*   Basic Metabolic Panel: Recent Labs  Lab 04/24/24 1104 04/24/24 1122 04/25/24 0430 04/27/24 0352  NA 143 141 142 136  K 4.2 4.0 4.0 3.7  CL 106  --  107 102  CO2 29  --  27 24  GLUCOSE 147*  --  124* 115*  BUN 55*  --  50* 66*  CREATININE 3.41*  --  3.15* 3.64*  CALCIUM 8.5*  --  8.5* 8.5*  MG  --   --   --  2.3  PHOS  --   --   --  4.7*   Liver Function Tests: Recent Labs  Lab 04/25/24 0430  AST 19  ALT 22  ALKPHOS 121  BILITOT <0.2  PROT 5.9*  ALBUMIN  2.9*   No results for input(s): LIPASE, AMYLASE in the last 168 hours. No results for input(s): AMMONIA in the last 168 hours. CBC: Recent Labs  Lab 04/24/24 1104 04/24/24 1122 04/25/24 0430 04/27/24 0352 04/27/24 0931  WBC 12.2*  --  10.6* 17.7*  --   NEUTROABS 9.9*  --   --   --   --   HGB 8.9* 8.8* 9.2* 8.1* 8.4*  HCT 28.2* 26.0* 29.8* 24.8* 25.8*  MCV 97.6  --  96.4 94.7  --   PLT 308  --  327 298  --    Cardiac Enzymes: No results for input(s): CKTOTAL, CKMB, CKMBINDEX, TROPONINI in the last 168 hours. BNP: Invalid input(s): POCBNP CBG: Recent Labs  Lab 04/26/24 1129 04/26/24 1640  04/26/24 1940 04/27/24 0738 04/27/24 0818  GLUCAP 241* 108* 151* 116* 167*   D-Dimer No results for input(s): DDIMER in the last 72 hours. Hgb A1c No results for input(s): HGBA1C in the last 72 hours. Lipid Profile No results for input(s): CHOL, HDL, LDLCALC, TRIG, CHOLHDL, LDLDIRECT in the last 72 hours. Thyroid  function studies No results for input(s): TSH, T4TOTAL, T3FREE, THYROIDAB in the last 72 hours.  Invalid input(s): FREET3 Anemia work up No results for input(s): VITAMINB12, FOLATE, FERRITIN, TIBC, IRON, RETICCTPCT in the last 72 hours. Urinalysis    Component Value Date/Time   COLORURINE YELLOW 11/29/2023 2041   APPEARANCEUR HAZY (A) 11/29/2023 2041   LABSPEC 1.014 11/29/2023 2041   PHURINE 5.0 11/29/2023 2041   GLUCOSEU >=500 (A) 11/29/2023 2041   HGBUR SMALL (A) 11/29/2023 2041   BILIRUBINUR NEGATIVE 11/29/2023 2041   KETONESUR NEGATIVE 11/29/2023 2041   PROTEINUR >=300 (A) 11/29/2023 2041   UROBILINOGEN 0.2 06/17/2014 1257   NITRITE NEGATIVE 11/29/2023 2041   LEUKOCYTESUR NEGATIVE 11/29/2023 2041   Sepsis Labs Recent Labs  Lab 04/24/24 1104 04/25/24 0430 04/27/24 0352  WBC 12.2* 10.6* 17.7*   Microbiology Recent Results (from the past 240 hours)  Resp panel by RT-PCR (RSV, Flu A&B, Covid) Anterior Nasal Swab     Status: Abnormal   Collection Time: 04/24/24 11:00 AM   Specimen: Anterior Nasal Swab  Result Value Ref Range Status   SARS Coronavirus 2 by RT PCR NEGATIVE NEGATIVE Final   Influenza A by PCR POSITIVE (A) NEGATIVE Final   Influenza B by PCR NEGATIVE NEGATIVE Final    Comment: (NOTE) The Xpert Xpress SARS-CoV-2/FLU/RSV plus assay is intended as an aid in the diagnosis of influenza from Nasopharyngeal swab specimens and should not be used as a sole basis for treatment. Nasal washings and aspirates are unacceptable for Xpert Xpress SARS-CoV-2/FLU/RSV testing.  Fact Sheet for  Patients: bloggercourse.com  Fact Sheet for Healthcare Providers: seriousbroker.it  This  test is not yet approved or cleared by the [place]  FDA and has been authorized for detection and/or diagnosis of SARS-CoV-2 by FDA under an Emergency Use Authorization (EUA). This EUA will remain in effect (meaning this test can be used) for the duration of the COVID-19 declaration under Section 564(b)(1) of the Act, 21 U.S.C. section 360bbb-3(b)(1), unless the authorization is terminated or revoked.     Resp Syncytial Virus by PCR NEGATIVE NEGATIVE Final    Comment: (NOTE) Fact Sheet for Patients: bloggercourse.com  Fact Sheet for Healthcare Providers: seriousbroker.it  This test is not yet approved or cleared by the [place]  FDA and has been authorized for detection and/or diagnosis of SARS-CoV-2 by FDA under an Emergency Use Authorization (EUA). This EUA will remain in effect (meaning this test can be used) for the duration of the COVID-19 declaration under Section 564(b)(1) of the Act, 21 U.S.C. section 360bbb-3(b)(1), unless the authorization is terminated or revoked.  Performed at [facility], [address], [place], [place] [address]      Time coordinating discharge: Over 30 minutes  SIGNED:   Darcel Dawley, MD  [facility] 04/27/2024, 1:25 PM Pager   If 7PM-7AM, please contact night-coverage     [1]  Allergies Allergen Reactions   Chantix  Continuing Month [Varenicline ] Other (See Comments)    Doesn't work for patient   Januvia [Sitagliptin] Other (See Comments)    Class Contraindicated   Lisinopril Swelling    Possible tongue swelling. ARB's okay   Motrin [Ibuprofen] Other (See Comments)    Contraindicated by Renal disease   Remeron [Mirtazapine] Other (See Comments)    Excess somnolence on the 15 mg dose   Penicillins Other (See  Comments)    Facial swelling

## 2024-04-27 NOTE — TOC Transition Note (Signed)
 Transition of Care [facility]) - Discharge Note   Patient Details  Name: Jeffrey Randall. MRN: [id number] Date of Birth: November 04, 1940  [facility] CM/SW Contact:  Tom-Johnson, Harvest Muskrat, RN Phone Number: 04/27/2024, 10:34 AM   Clinical Narrative:     Patient is scheduled for discharge today.  Readmission Risk Assessment done. Outpatient f/u, hospital f/u and discharge instructions on AVS. No ICM needs or recommendations noted. Daughter, Sigmon will transport at discharge.  No further ICM needs noted.        Barriers to Discharge: Barriers Resolved   Patient Goals and CMS Choice Patient states their goals for this hospitalization and ongoing recovery are:: To return home CMS Medicare.gov Compare Post Acute Care list provided to:: Patient Choice offered to / list presented to : NA      Discharge Placement                Patient to be transferred to facility by: Daughter Name of family member notified: Alfreda    Discharge Plan and Services Additional resources added to the After Visit Summary for                  DME Arranged: N/A DME Agency: NA       HH Arranged: NA HH Agency: NA        Social Drivers of Health (SDOH) Interventions SDOH Screenings   Food Insecurity: No Food Insecurity (04/25/2024)  Housing: Low Risk (04/25/2024)  Transportation Needs: No Transportation Needs (04/25/2024)  Utilities: Not At Risk (04/25/2024)  Social Connections: Moderately Isolated (04/25/2024)  Tobacco Use: High Risk (04/26/2024)     Readmission Risk Interventions    04/27/2024   10:33 AM 04/26/2024   10:22 AM 07/07/2023    3:41 PM  Readmission Risk Prevention Plan  Transportation Screening Complete Complete Complete  PCP or Specialist Appt within 3-5 Days  Complete   Home Care Screening   Complete  Medication Review (RN CM)   Complete  HRI or Home Care Consult  Complete   Social Work Consult for Recovery Care Planning/Counseling  Complete    Palliative Care Screening  Not Applicable   Medication Review Oceanographer) Referral to Pharmacy Referral to Pharmacy   PCP or Specialist appointment within 3-5 days of discharge Complete    HRI or Home Care Consult Complete    SW Recovery Care/Counseling Consult Complete    Palliative Care Screening Not Applicable    Skilled Nursing Facility Not Applicable

## 2024-04-27 NOTE — Discharge Instructions (Signed)
 Advised to take Tamiflu  75 mg twice daily for 3 more days. Advised to take prednisone  40 mg daily for 3 more days.

## 2024-05-02 ENCOUNTER — Ambulatory Visit: Payer: Self-pay | Admitting: Pulmonary Disease

## 2024-05-02 NOTE — Telephone Encounter (Signed)
 FYI Only or Action Required?: FYI only for provider: appointment scheduled on 05/03/2024 at 11:30am with Dr Theophilus.  Patient is followed in Pulmonology for COPD, last seen on 01/05/2024 by Mannam, Praveen, MD.  Called Nurse Triage reporting Shortness of Breath.  Symptoms began several weeks ago.  Interventions attempted: OTC medications: Mucinex , Nyquil, Prescription medications: Tamiflu  and Prednisone  (completed), Rescue inhaler, Maintenance inhaler, Nebulizer treatments, Home oxygen  use, and Increased fluids/rest.  Symptoms are: overall worse in the past few weeks but patient has been seen at the ER twice and patient confirms that since leaving the ER four days ago he feels better.  Triage Disposition: See Physician Within 24 Hours (overriding See HCP Within 4 Hours (Or PCP Triage))  Patient/caregiver understands and will follow disposition?: Yes   Message from [facility] sent at 05/02/2024  2:38 PM EST  Reason for Triage: wheezing, shortness of breath   Reason for Disposition  [1] Longstanding difficulty breathing (e.g., CHF, COPD, emphysema) AND [2] WORSE than normal  Answer Assessment - Initial Assessment Questions Patient's son called and wanted to make a follow up appointment with Pulmonology for patient who was in the hospital 4 days ago Patient is on Oxygen  continuously now at 3 liters Patient diagnosed with the Flu on 04/27/2024--(4 days ago) Son states that the patient's breathing is getting worse in the past  Son states that when his father, the patient, is at rest his Oxygen  saturation will return to near 100% but when he gets up and gets moving/exerts himself he will become short of breath and his oxygen  saturation drops. He states that this was happening while he was at the hospital, it was monitored, and patient was discharged with the same. When patient was discharged from the hospital--they released him with the instructions of using Oxygen  at 3 liters  Albuterol   inhaler--uses twice a day Albuterol  Nebulizer--twice a day Trelegy inhaler--twice a day  Patient is using oxygen  almost all day now--in the past two weeks patient has increased to 3 liters continuously  Son denies any known fevers or vomiting Son states patient had been coughing up some yellowish mucous in the mornings and throughout the day this clears up Patient was put on Eliquis  in August when he had RSV for a history of a PE  Patient has also been taking Mucinex , Nyquil, Tamiflu , Prednisone (completed)  Patient checked the patient's Oxygen  level at this time-- 93% and 68 pulse on 3 liters  Patient and his son are advised to call us  back if anything changes or with any further questions/concerns. They are advised that if anything worsens to go to the Emergency Room. Patient and his son verbalized understanding.  Protocols used: Breathing Difficulty-A-AH

## 2024-05-03 ENCOUNTER — Encounter: Payer: Self-pay | Admitting: Pulmonary Disease

## 2024-05-03 ENCOUNTER — Ambulatory Visit: Admitting: Pulmonary Disease

## 2024-05-03 VITALS — BP 173/65 | HR 68 | Ht 69.0 in | Wt 146.0 lb

## 2024-05-03 DIAGNOSIS — Z8709 Personal history of other diseases of the respiratory system: Secondary | ICD-10-CM

## 2024-05-03 DIAGNOSIS — F17211 Nicotine dependence, cigarettes, in remission: Secondary | ICD-10-CM | POA: Diagnosis not present

## 2024-05-03 DIAGNOSIS — J449 Chronic obstructive pulmonary disease, unspecified: Secondary | ICD-10-CM | POA: Diagnosis not present

## 2024-05-03 DIAGNOSIS — Z86718 Personal history of other venous thrombosis and embolism: Secondary | ICD-10-CM

## 2024-05-03 DIAGNOSIS — R918 Other nonspecific abnormal finding of lung field: Secondary | ICD-10-CM

## 2024-05-03 DIAGNOSIS — J9611 Chronic respiratory failure with hypoxia: Secondary | ICD-10-CM | POA: Diagnosis not present

## 2024-05-03 DIAGNOSIS — C3490 Malignant neoplasm of unspecified part of unspecified bronchus or lung: Secondary | ICD-10-CM | POA: Diagnosis not present

## 2024-05-03 DIAGNOSIS — Z7901 Long term (current) use of anticoagulants: Secondary | ICD-10-CM | POA: Diagnosis not present

## 2024-05-03 MED ORDER — TRELEGY ELLIPTA 200-62.5-25 MCG/ACT IN AEPB: 1.0000 | INHALATION_SPRAY | Freq: Every day | RESPIRATORY_TRACT | 11 refills | Status: AC

## 2024-05-03 MED ORDER — PREDNISONE 10 MG PO TABS: 60.0000 mg | ORAL_TABLET | Freq: Every day | ORAL | 0 refills | Status: AC

## 2024-05-03 NOTE — Patient Instructions (Signed)
" °  VISIT SUMMARY: You had a follow-up visit after your recent hospitalization for influenza. Your breathing has improved, and we discussed your COPD management, recent influenza infection, lung mass, and anticoagulation therapy.  YOUR PLAN: SEVERE CHRONIC OBSTRUCTIVE PULMONARY DISEASE (COPD) WITH CHRONIC RESPIRATORY FAILURE ON HOME OXYGEN : You have severe COPD and require home oxygen  therapy. Your recent exacerbation was treated with Tamiflu  and prednisone , and you are currently using Trelegy and albuterol  nebulizer. -Continue using home oxygen  therapy at 3 liters per minute for most of the day. -Reordered your Trelegy inhaler. -Prescribed prednisone  for home use in case of exacerbation. -Ensured availability of a backup oxygen  tank. -We will check on benefits for your Ohtuvayre  neb medication.  RECENT INFLUENZA INFECTION: You were recently hospitalized for influenza and treated with Tamiflu  and prednisone . Your breathing has improved. -No additional treatment needed at this time as you are feeling better.  HISTORY OF STAGE IA PRESUMED NON-SMALL CELL LUNG CANCER, POST-RADIATION, UNDER SURVEILLANCE: You have a history of stage IA presumed non-small cell lung cancer treated with radiation. A recent CT scan showed a mass-like opacity in your right lung. -Ordered a PET scan to evaluate the right lung opacity.  HISTORY OF VENOUS THROMBOEMBOLISM ON ANTICOAGULATION: You are on Eliquis  for a previous clotting event. -Checked on the availability of your Eliquis  prescription refill.    Contains text generated by [employer].   "

## 2024-05-03 NOTE — Progress Notes (Signed)
 "              Jeffrey Randall    [id number]    [birth year]  Primary Care Physician:Sheldon Netter, PA  Referring Physician: Sheldon Netter, PA [address] [place],  [place] [address]  Chief complaint: Follow up for severe COPD, lung cancer, hospitalization for exacerbation  HPI: 84 y.o. who  has a past medical history of Alcoholism in recovery [facility]), Aortic atherosclerosis, Back pain (06/17/2014), Chronic lung disease, CKD (chronic kidney disease), COPD (chronic obstructive pulmonary disease) (HCC) (07/23/2014), Essential hypertension, Gout, History of lung cancer, History of pancreatitis, Iron deficiency anemia, Lumbar disc disease, Mixed hyperlipidemia, Notalgia, Prostate hypertrophy, Pulmonary nodules (07/23/2014), Swelling, Tobacco use disorder (07/23/2014), Tumor of lung (01/26/2021), Type 2 diabetes mellitus without complication, and Uncontrolled type 2 diabetes mellitus with hyperglycemia, without long-term current use of insulin  (HCC).   Discussed the use of AI scribe software for clinical note transcription with the patient, who gave verbal consent to proceed.  The patient, with a history of lung nodules, emphysema, and Stage IA2 [id number] presumed NSCLC of right upper lung with negative EBUS biopsy treated with SBRT in 2022  Presents with shortness of breath. The patient also has a history of hypertension and diabetes. Despite these conditions, the patient continues to smoke, currently at a pack and a half per day. The patient also has a nebulizer but it is unclear how much of the medication the patient is actually receiving. The patient has been evaluated for kidney disease and has chronic kidney disease and heart disease.   Hospitalized in March 2025 for COPD exacerbation secondary to metapneumovirus Hospitalized in August 2025 for COPD exacerbation.  Identified with pulm embolism with positive VQ scan and started on Eliquis  anticoagulation.  Interim history: Discussed the  use of AI scribe software for clinical note transcription with the patient, who gave verbal consent to proceed.  History of Present Illness Jeffrey Randall. is an 84 year old male with severe COPD who presents for follow-up after recent hospitalization for influenza. He is accompanied by his grandson.  Dyspnea and respiratory status - Severe chronic obstructive pulmonary disease with recent improvement in breathing following hospitalization January 2025 for influenza. - Treated with Tamiflu  and a short course of prednisone . - Uses Trelegy once daily; inhaler currently empty. - Uses albuterol  via nebulizer twice daily, though not used today. - Requires supplemental oxygen  at 3 liters for most of the day. - Completed pulmonary rehabilitation from August to November last year; transportation limits ability to attend in-person sessions. - No current tobacco use and avoids secondhand smoke.  Pulmonary mass and malignancy - CT scan revealed mass-like opacity in the right lung. - Received SBRT radiation treatment in 2022 for presumed stage 1A non-small cell lung cancer with negative biopsy  Anticoagulation and thromboembolic history - On Eliquis  for clotting event in August.   Relevant pulmonary history Pets: Dog Occupation: Retired curator Exposures: No exposures.  No mold, hot tub, Jacuzzi.  No feather pillows or comforters Smoking history: 90-pack-year smoker.  Continues to smoke Travel history: No significant travel history Relevant family history: No family history of lung disease  Outpatient Encounter Medications as of 05/03/2024  Medication Sig   albuterol  (VENTOLIN  HFA) 108 (90 Base) MCG/ACT inhaler Inhale 1-2 puffs into the lungs every 6 (six) hours as needed for wheezing or shortness of breath.   allopurinol (ZYLOPRIM) 100 MG tablet Take 100 mg by mouth every morning.   amLODipine  (NORVASC ) 10 MG tablet  Take 1 tablet (10 mg total) by mouth daily.   apixaban  (ELIQUIS ) 5 MG TABS  tablet Take 1 tablet (5 mg total) by mouth 2 (two) times daily. Start this once the eliquis  starter pack is complete.   carvedilol  (COREG ) 12.5 MG tablet Take 1 tablet (12.5 mg total) by mouth 2 (two) times daily with a meal.   Cyanocobalamin (VITAMIN B-12 CR) 1500 MCG TBCR Take 1,500 mcg by mouth daily.   dapagliflozin propanediol (FARXIGA) 10 MG TABS tablet Take 10 mg by mouth daily.   DM-Doxylamine-Acetaminophen  (NYQUIL COLD & FLU PO) Take 1 Dose by mouth every 6 (six) hours as needed (cold/cough).   finasteride  (PROSCAR ) 5 MG tablet Take 5 mg by mouth daily.   Fluticasone -Umeclidin-Vilant (TRELEGY ELLIPTA ) 200-62.5-25 MCG/ACT AEPB Inhale 1 puff into the lungs daily.   guaifenesin  (ROBITUSSIN) 100 MG/5ML syrup Take 200 mg by mouth 3 (three) times daily as needed for cough.   insulin  degludec (TRESIBA ) 100 UNIT/ML FlexTouch Pen Inject 25 Units into the skin daily.   ipratropium-albuterol  (DUONEB) 0.5-2.5 (3) MG/3ML SOLN Take 3 mLs by nebulization every 4 (four) hours as needed (Wheezing, SOB).   Multiple Vitamins-Minerals (MULTIVITAMIN WITH MINERALS) tablet Take 1 tablet by mouth daily.   Omega-3 Fatty Acids (FISH OIL) 1200 MG CAPS Take 1,200 mg by mouth daily.   Phenyleph-Doxylamine-DM-APAP (ALKA-SELTZER PLS ALLERGY & CGH PO) Take 1 Dose by mouth every 4 (four) hours as needed (cold/cough).   Pseudoephedrine-APAP-DM (DAYQUIL MULTI-SYMPTOM COLD/FLU PO) Take 1 Dose by mouth every 6 (six) hours as needed (cold/cough).   Pseudoephedrine-Ibuprofen (ADVIL COLD/SINUS PO) Take 1 Dose by mouth every 6 (six) hours as needed (cold/sinus).   SAMBUCOL BLACK ELDERBERRY PO Take 1 Dose by mouth daily as needed (immune support).   simvastatin  (ZOCOR ) 10 MG tablet Take 10 mg by mouth at bedtime.   tamsulosin  (FLOMAX ) 0.4 MG CAPS capsule Take 0.8 mg by mouth at bedtime.   No facility-administered encounter medications on file as of 05/03/2024.   Vitals:   05/03/24 1139  BP: (!) 160/64  Pulse: 68  Height: 5'  9 (1.753 m)  Weight: 146 lb (66.2 kg)  SpO2: (!) 86%  TempSrc: Oral  BMI (Calculated): 21.55     Physical Exam GEN: No acute distress. CV: Regular rate and rhythm, no murmurs. LUNGS: Clear to auscultation bilaterally, normal respiratory effort, no wheeze. SKIN JOINTS: Warm and dry, no rash.    Data Reviewed: Imaging: CT chest 01/21/2015 Interval resolution of bilateral pulmonary nodules, moderate upper lobe centrilobular emphysema, 3 textural distortion in the right upper lobe.  I have reviewed the images personally  CT chest [[facility]] 12/09/2022 Interval development of consolidation at the extreme right lung apex, which was not clearly within the radiation portal and has developed later than expected following radiation therapy. Suggest short-term follow-up versus PET/CT for further evaluation.   PET scan [[facility]] 12/22/2022 1.  Redemonstrated consolidation superimposed on right upper lobe radiation fibrosis with mild diffusely increased FDG uptake, favored secondary to infection/inflammation.  No evidence of recurrence or distant metastasis.  2.  Similar prominent mediastinal lymph nodes without substantial uptake, likely reactive.   CT chest [[facility]] 07/12/2023 1.  Stable consolidation and fibrosis involving the right lung apex, favored to reflect changes related to radiation treatment. Recommend continued attention on follow-up imaging per oncologic protocol.  2.  Left lower lobe predominant bronchial wall thickening with fibrosis and volume loss, suggestive of chronic aspiration.  3.  Small volume ascites.   VQ scan 11/30/2023-Multiple perfusion  defects concerning for pulmonary embolism  CT chest 04/13/2024-masslike consolidation of the right lung apex, left adrenal mass  PFTs: 01/05/2024 FVC 2.91 [75%], FEV1 1.24 [46%],/43, TLC 8.90 [125%], DLCO 5.84 [24%] Severe obstruction, severe diffusion impairment  Labs: CBC 12/02/2023-WBC 12.9, eos 0% Assessment & Plan Severe chronic  obstructive pulmonary disease (COPD) with chronic respiratory failure on home oxygen  Severe COPD with chronic respiratory failure, currently on home oxygen  at 3 liters per minute for 90% of the day. Recent exacerbation treated with Tamiflu  and prednisone . Currently using Trelegy and albuterol  nebulizer. Ohtuvayre  prescribed at last visit but he has not received it yet.  No wheezing on examination, indicating improvement. Concerns about oxygen  tank availability during power outages. - Reordered Trelegy inhaler - Prescribed prednisone  for home use in case of exacerbation - Ensured availability of backup oxygen  tank - Continue home oxygen  therapy at 3 liters per minute - Will check on benefits for Ohtuvayre  medication  Recent influenza infection Recent influenza infection treated with Tamiflu  and prednisone . Hospitalized for three days. Currently feeling better with improved breathing.  History of stage IA presumed non-small cell lung cancer, post-radiation, under surveillance Stage IA presumed non-small cell lung cancer, post-radiation therapy in 2021. Previous CT scan showed a spiculated nodule in the right upper lobe. Current CT scan shows a mass-like opacity in the right lung, possibly due to scarring from radiation with an additional adrenal nodule.  EBUS biopsy in 2022 was negative, and the nodule was irradiated as a precaution. - Ordered PET scan to evaluate right lung opacity and adrenal nodule  History of venous thromboembolism on anticoagulation Venous thromboembolism in 2025 treated with anticoagulation therapy. Currently on Eliquis . Prescription refill was called in, but confirmation of availability is needed. - Checked on Eliquis  prescription availability  Tobacco use disorder Quit 3 weeks ago.  Encouraged to stay off cigarettes.   Recommendations: Continue Trelegy, Check with pharmacy regarding ohtuvayre  Smoking cessation Pulmonary rehabilitation Send an order for prednisone  PET  scan  I personally spent a total of 45 minutes in the care of the patient today including preparing to see the patient, getting/reviewing separately obtained history, performing a medically appropriate exam/evaluation, referring and communicating with other health care professionals, documenting clinical information in the EHR, independently interpreting results, communicating results, and coordinating care.   Macee Venables MD [facility] 05/03/2024, 11:49 AM  CC: Sheldon Netter, PA    "

## 2024-05-08 NOTE — Telephone Encounter (Signed)
 Patient last logged into Jeffrey Randall 04/13/24. Unclear if he will see message.   I tried calling patient at [phone], left detailed  HIPAA compliant VM.   Called son, Buren. Gave detailed information as outlined in the MyChart message. He took notes on who to call and what phone numbers.   He will call our office if further barriers arise.

## 2024-05-09 ENCOUNTER — Other Ambulatory Visit: Payer: Self-pay

## 2024-05-09 ENCOUNTER — Emergency Department (HOSPITAL_COMMUNITY)

## 2024-05-09 ENCOUNTER — Encounter (HOSPITAL_COMMUNITY): Payer: Self-pay

## 2024-05-09 ENCOUNTER — Inpatient Hospital Stay (HOSPITAL_COMMUNITY): Admission: EM | Admit: 2024-05-09 | Source: Home / Self Care | Attending: Family Medicine | Admitting: Family Medicine

## 2024-05-09 DIAGNOSIS — N400 Enlarged prostate without lower urinary tract symptoms: Secondary | ICD-10-CM | POA: Diagnosis present

## 2024-05-09 DIAGNOSIS — J189 Pneumonia, unspecified organism: Secondary | ICD-10-CM | POA: Diagnosis not present

## 2024-05-09 DIAGNOSIS — J449 Chronic obstructive pulmonary disease, unspecified: Secondary | ICD-10-CM | POA: Diagnosis present

## 2024-05-09 DIAGNOSIS — J9601 Acute respiratory failure with hypoxia: Secondary | ICD-10-CM | POA: Diagnosis not present

## 2024-05-09 DIAGNOSIS — J984 Other disorders of lung: Secondary | ICD-10-CM

## 2024-05-09 DIAGNOSIS — J850 Gangrene and necrosis of lung: Secondary | ICD-10-CM | POA: Diagnosis not present

## 2024-05-09 DIAGNOSIS — Z87438 Personal history of other diseases of male genital organs: Secondary | ICD-10-CM | POA: Diagnosis present

## 2024-05-09 DIAGNOSIS — I2782 Chronic pulmonary embolism: Secondary | ICD-10-CM

## 2024-05-09 DIAGNOSIS — E119 Type 2 diabetes mellitus without complications: Secondary | ICD-10-CM

## 2024-05-09 DIAGNOSIS — Z794 Long term (current) use of insulin: Secondary | ICD-10-CM

## 2024-05-09 DIAGNOSIS — I10 Essential (primary) hypertension: Secondary | ICD-10-CM | POA: Diagnosis not present

## 2024-05-09 DIAGNOSIS — I2699 Other pulmonary embolism without acute cor pulmonale: Secondary | ICD-10-CM | POA: Diagnosis present

## 2024-05-09 DIAGNOSIS — J441 Chronic obstructive pulmonary disease with (acute) exacerbation: Secondary | ICD-10-CM

## 2024-05-09 LAB — BASIC METABOLIC PANEL WITH GFR
Anion gap: 7 (ref 5–15)
BUN: 33 mg/dL — ABNORMAL HIGH (ref 8–23)
CO2: 27 mmol/L (ref 22–32)
Calcium: 8.4 mg/dL — ABNORMAL LOW (ref 8.9–10.3)
Chloride: 108 mmol/L (ref 98–111)
Creatinine, Ser: 3.04 mg/dL — ABNORMAL HIGH (ref 0.61–1.24)
GFR, Estimated: 20 mL/min — ABNORMAL LOW
Glucose, Bld: 226 mg/dL — ABNORMAL HIGH (ref 70–99)
Potassium: 4.6 mmol/L (ref 3.5–5.1)
Sodium: 141 mmol/L (ref 135–145)

## 2024-05-09 LAB — I-STAT CG4 LACTIC ACID, ED: Lactic Acid, Venous: 0.9 mmol/L (ref 0.5–1.9)

## 2024-05-09 LAB — CBG MONITORING, ED: Glucose-Capillary: 141 mg/dL — ABNORMAL HIGH (ref 70–99)

## 2024-05-09 LAB — CBC
HCT: 26.1 % — ABNORMAL LOW (ref 39.0–52.0)
Hemoglobin: 8.1 g/dL — ABNORMAL LOW (ref 13.0–17.0)
MCH: 30.1 pg (ref 26.0–34.0)
MCHC: 31 g/dL (ref 30.0–36.0)
MCV: 97 fL (ref 80.0–100.0)
Platelets: 313 10*3/uL (ref 150–400)
RBC: 2.69 MIL/uL — ABNORMAL LOW (ref 4.22–5.81)
RDW: 14.3 % (ref 11.5–15.5)
WBC: 7.8 10*3/uL (ref 4.0–10.5)
nRBC: 0 % (ref 0.0–0.2)

## 2024-05-09 LAB — TROPONIN T, HIGH SENSITIVITY
Troponin T High Sensitivity: 156 ng/L (ref 0–19)
Troponin T High Sensitivity: 153 ng/L (ref 0–19)

## 2024-05-09 MED ORDER — AZITHROMYCIN 250 MG PO TABS
500.0000 mg | ORAL_TABLET | Freq: Once | ORAL | Status: AC
  Administered 2024-05-09: 500 mg via ORAL
  Filled 2024-05-09: qty 2

## 2024-05-09 MED ORDER — VANCOMYCIN HCL 1500 MG/300ML IV SOLN
1500.0000 mg | Freq: Once | INTRAVENOUS | Status: AC
  Administered 2024-05-10: 1500 mg via INTRAVENOUS
  Filled 2024-05-09: qty 300

## 2024-05-09 MED ORDER — HYDROCODONE-ACETAMINOPHEN 5-325 MG PO TABS: 1.0000 | ORAL_TABLET | ORAL | Status: AC | PRN

## 2024-05-09 MED ORDER — TAMSULOSIN HCL 0.4 MG PO CAPS
0.8000 mg | ORAL_CAPSULE | Freq: Every day | ORAL | Status: AC
  Administered 2024-05-10 – 2024-05-18 (×9): 0.8 mg via ORAL
  Filled 2024-05-09 (×10): qty 2

## 2024-05-09 MED ORDER — IPRATROPIUM-ALBUTEROL 0.5-2.5 (3) MG/3ML IN SOLN
3.0000 mL | Freq: Once | RESPIRATORY_TRACT | Status: AC
  Administered 2024-05-09: 3 mL via RESPIRATORY_TRACT
  Filled 2024-05-09: qty 3

## 2024-05-09 MED ORDER — ONDANSETRON HCL 4 MG PO TABS: 4.0000 mg | ORAL_TABLET | Freq: Four times a day (QID) | ORAL | Status: AC | PRN

## 2024-05-09 MED ORDER — IPRATROPIUM-ALBUTEROL 0.5-2.5 (3) MG/3ML IN SOLN: 3.0000 mL | RESPIRATORY_TRACT | Status: DC | PRN

## 2024-05-09 MED ORDER — METRONIDAZOLE 500 MG/100ML IV SOLN
500.0000 mg | Freq: Two times a day (BID) | INTRAVENOUS | Status: AC
  Administered 2024-05-10 – 2024-05-18 (×19): 500 mg via INTRAVENOUS
  Filled 2024-05-09 (×19): qty 100

## 2024-05-09 MED ORDER — ONDANSETRON HCL 4 MG/2ML IJ SOLN: 4.0000 mg | Freq: Four times a day (QID) | INTRAMUSCULAR | Status: AC | PRN

## 2024-05-09 MED ORDER — SODIUM CHLORIDE 0.9 % IV SOLN: INTRAVENOUS | Status: AC

## 2024-05-09 MED ORDER — VANCOMYCIN HCL IN DEXTROSE 1-5 GM/200ML-% IV SOLN: 1000.0000 mg | INTRAVENOUS | Status: DC

## 2024-05-09 MED ORDER — SODIUM CHLORIDE 0.9 % IV SOLN
1.0000 g | Freq: Once | INTRAVENOUS | Status: AC
  Administered 2024-05-09: 1 g via INTRAVENOUS
  Filled 2024-05-09: qty 10

## 2024-05-09 MED ORDER — GUAIFENESIN ER 600 MG PO TB12
600.0000 mg | ORAL_TABLET | Freq: Two times a day (BID) | ORAL | Status: AC
  Administered 2024-05-10 – 2024-05-18 (×18): 600 mg via ORAL
  Filled 2024-05-09 (×18): qty 1

## 2024-05-09 MED ORDER — ACETAMINOPHEN 325 MG PO TABS
650.0000 mg | ORAL_TABLET | Freq: Four times a day (QID) | ORAL | Status: AC | PRN
  Filled 2024-05-09: qty 2

## 2024-05-09 MED ORDER — INSULIN ASPART 100 UNIT/ML IJ SOLN
0.0000 [IU] | INTRAMUSCULAR | Status: DC
  Administered 2024-05-10: 2 [IU] via SUBCUTANEOUS
  Administered 2024-05-10: 3 [IU] via SUBCUTANEOUS
  Administered 2024-05-11: 1 [IU] via SUBCUTANEOUS
  Administered 2024-05-11: 3 [IU] via SUBCUTANEOUS
  Administered 2024-05-11: 2 [IU] via SUBCUTANEOUS
  Administered 2024-05-11: 4 [IU] via SUBCUTANEOUS
  Administered 2024-05-11: 1 [IU] via SUBCUTANEOUS
  Administered 2024-05-12: 2 [IU] via SUBCUTANEOUS
  Administered 2024-05-12: 1 [IU] via SUBCUTANEOUS
  Administered 2024-05-13: 3 [IU] via SUBCUTANEOUS
  Administered 2024-05-13: 2 [IU] via SUBCUTANEOUS
  Administered 2024-05-14: 3 [IU] via SUBCUTANEOUS
  Administered 2024-05-14: 5 [IU] via SUBCUTANEOUS
  Administered 2024-05-15: 1 [IU] via SUBCUTANEOUS
  Administered 2024-05-15: 3 [IU] via SUBCUTANEOUS
  Administered 2024-05-15 – 2024-05-16 (×2): 2 [IU] via SUBCUTANEOUS
  Administered 2024-05-16: 4 [IU] via SUBCUTANEOUS
  Administered 2024-05-16: 2 [IU] via SUBCUTANEOUS
  Administered 2024-05-17: 5 [IU] via SUBCUTANEOUS
  Administered 2024-05-17 (×2): 4 [IU] via SUBCUTANEOUS
  Filled 2024-05-09: qty 1
  Filled 2024-05-09: qty 4
  Filled 2024-05-09: qty 1
  Filled 2024-05-09: qty 2
  Filled 2024-05-09: qty 1
  Filled 2024-05-09: qty 3
  Filled 2024-05-09: qty 5
  Filled 2024-05-09: qty 4
  Filled 2024-05-09 (×2): qty 2
  Filled 2024-05-09: qty 5
  Filled 2024-05-09: qty 6
  Filled 2024-05-09: qty 3
  Filled 2024-05-09: qty 6
  Filled 2024-05-09: qty 4
  Filled 2024-05-09 (×2): qty 3
  Filled 2024-05-09: qty 2
  Filled 2024-05-09 (×2): qty 1
  Filled 2024-05-09: qty 3
  Filled 2024-05-09 (×2): qty 2
  Filled 2024-05-09: qty 4

## 2024-05-09 MED ORDER — AMLODIPINE BESYLATE 5 MG PO TABS
10.0000 mg | ORAL_TABLET | Freq: Every day | ORAL | Status: AC
  Administered 2024-05-10 – 2024-05-18 (×9): 10 mg via ORAL
  Filled 2024-05-09 (×6): qty 1
  Filled 2024-05-09: qty 2
  Filled 2024-05-09 (×2): qty 1

## 2024-05-09 MED ORDER — NICOTINE 7 MG/24HR TD PT24: 7.0000 mg | MEDICATED_PATCH | Freq: Every day | TRANSDERMAL | Status: DC

## 2024-05-09 MED ORDER — APIXABAN 5 MG PO TABS
5.0000 mg | ORAL_TABLET | Freq: Two times a day (BID) | ORAL | Status: AC
  Administered 2024-05-10 – 2024-05-18 (×18): 5 mg via ORAL
  Filled 2024-05-09 (×18): qty 1

## 2024-05-09 MED ORDER — SIMVASTATIN 20 MG PO TABS
10.0000 mg | ORAL_TABLET | Freq: Every day | ORAL | Status: AC
  Administered 2024-05-10 – 2024-05-18 (×9): 10 mg via ORAL
  Filled 2024-05-09 (×9): qty 1

## 2024-05-09 MED ORDER — FINASTERIDE 5 MG PO TABS
5.0000 mg | ORAL_TABLET | Freq: Every day | ORAL | Status: AC
  Administered 2024-05-10 – 2024-05-18 (×9): 5 mg via ORAL
  Filled 2024-05-09 (×9): qty 1

## 2024-05-09 MED ORDER — METHYLPREDNISOLONE SODIUM SUCC 125 MG IJ SOLR
125.0000 mg | Freq: Once | INTRAMUSCULAR | Status: AC
  Administered 2024-05-09: 125 mg via INTRAVENOUS
  Filled 2024-05-09: qty 2

## 2024-05-09 MED ORDER — INSULIN GLARGINE 100 UNIT/ML ~~LOC~~ SOLN
10.0000 [IU] | Freq: Every day | SUBCUTANEOUS | Status: DC
  Administered 2024-05-10: 10 [IU] via SUBCUTANEOUS
  Filled 2024-05-09 (×2): qty 0.1

## 2024-05-09 MED ORDER — FENTANYL CITRATE (PF) 50 MCG/ML IJ SOSY: 12.5000 ug | PREFILLED_SYRINGE | INTRAMUSCULAR | Status: DC | PRN

## 2024-05-09 MED ORDER — CARVEDILOL 12.5 MG PO TABS
12.5000 mg | ORAL_TABLET | Freq: Two times a day (BID) | ORAL | Status: AC
  Administered 2024-05-11 – 2024-05-18 (×16): 12.5 mg via ORAL
  Filled 2024-05-09 (×17): qty 1

## 2024-05-09 MED ORDER — ACETAMINOPHEN 650 MG RE SUPP: 650.0000 mg | Freq: Four times a day (QID) | RECTAL | Status: AC | PRN

## 2024-05-09 MED ORDER — ALBUTEROL SULFATE (2.5 MG/3ML) 0.083% IN NEBU
2.5000 mg | INHALATION_SOLUTION | RESPIRATORY_TRACT | Status: AC | PRN
  Administered 2024-05-11 – 2024-05-18 (×10): 2.5 mg via RESPIRATORY_TRACT
  Filled 2024-05-09 (×10): qty 3

## 2024-05-09 MED ORDER — SODIUM CHLORIDE 0.9 % IV SOLN
2.0000 g | INTRAVENOUS | Status: DC
  Administered 2024-05-09 – 2024-05-13 (×5): 2 g via INTRAVENOUS
  Filled 2024-05-09 (×5): qty 12.5

## 2024-05-09 NOTE — Assessment & Plan Note (Signed)
 Order sliding scale decrease Tresiba  down to 10 units

## 2024-05-09 NOTE — ED Triage Notes (Signed)
 Patient arrives via POV for cough and shortness of breath. Patient endorses he wears 2L [place] chronically. Patient arrives on room air with labored breathing and accessory muscle use. Patient endorses no pain. GCS 15.

## 2024-05-09 NOTE — Assessment & Plan Note (Signed)
 Chronically on 2 L Continue to titrate O2 as needed Increased oxygen  requirement in the setting of necrotizing pneumonia with cavitary lesion

## 2024-05-09 NOTE — Assessment & Plan Note (Signed)
 Continue home medications hold off on prednisone  for tonight

## 2024-05-09 NOTE — Assessment & Plan Note (Signed)
-   Continue home meds

## 2024-05-09 NOTE — Assessment & Plan Note (Signed)
 For now we will continue Eliquis  2 times daily

## 2024-05-09 NOTE — H&P (Signed)
 "    [name]. FMW:[id number] DOB: 1941/01/27 DOA: 05/09/2024     PCP: Sheldon Netter, PA     Patient arrived to ER on 05/09/24 at 1444 Referred by Attending Gennaro Duwaine CROME, DO   Patient coming from:    home Lives   With family      Chief Complaint:   Chief Complaint  Patient presents with   Shortness of Breath   Cough    HPI: Jeffrey Randall. is a 84 y.o. male with medical history significant of   COPD, CKD 3b, HTN, DM2, hx. lung cancer s/p radiation, gout, chronic normocytic anemia, history of pulmonary embolism in 2024 on Eliquis     Presented with worsening shortness of breath cough Patient presented with coughing and shortness of breath patient has known history of COPD foot which he wears 2 L of oxygen .  Symptoms has progressed and he is now having trouble ambulating no associated fevers no abdominal pain no chest pain no fever nausea or vomiting he is on Eliquis  in emergency department patient appears to be tachypneic and wheezing with slightly elevated troponin 150s but stable he was given Duo[facility] which showed cavitary lesion on the right middle lung suspicious for necrotizing pneumonia versus septic emboli Started on Rocephin  azithromycin     Patient was just admitted recently with influenza A and bronchitis stayed between 13th to 16 January.  He received a course of steroids  Patient states his family lives on and off with him sometimes a 50-year-old child stays with him.  He has been having progressive weight loss and chronic cough has been ongoing he been try to stop smoking and has been successful for the past 1 month  Patient denies ever been incarcerated, never been homeless, never worked in medical system, never had to be housed in large facility with other people, no significant travel history, he has been in the [employer] stationed in [place] .  Denies any hemoptysis Reports cough productive of occasional just white sputum, denies heartburn  denies aspiration symptoms Overall his symptoms of cough shortness of breath weight loss has been going on for past 3 months  Denies significant ETOH intake   Does not smoke     Regarding pertinent Chronic problems:    Hyperlipidemia -  on statins Zocor      HTN on Norvasc  Coreg  Diovan   chronic CHF diastolic/systolic/ combined - last echo  Recent Results (from the past 56199 hours)  ECHOCARDIOGRAM COMPLETE   Collection Time: 11/30/23  5:33 PM  Result Value   Weight 2,240   Height 70   BP 141/44   Single Plane A2C EF 53.5   Single Plane A4C EF 57.4   Calc EF 54.8   S' Lateral 3.00   Area-P 1/2 3.17   Est EF 60 - 65%   Narrative      ECHOCARDIOGRAM REPORT      IMPRESSIONS    1. Left ventricular ejection fraction, by estimation, is 60 to 65%. The left ventricle has normal function. The left ventricle has no regional wall motion abnormalities. There is mild concentric left ventricular hypertrophy. Left ventricular diastolic  parameters are consistent with Grade I diastolic dysfunction (impaired relaxation).  2. Right ventricular systolic function is normal. The right ventricular size is normal.  3. The mitral valve is normal in structure. Trivial mitral valve regurgitation. No evidence of mitral stenosis.  4. The aortic valve is tricuspid. There is moderate calcification of the aortic valve. Aortic valve  regurgitation is not visualized. Aortic valve sclerosis/calcification is present, without any evidence of aortic stenosis.  5. The inferior vena cava is normal in size with greater than 50% respiratory variability, suggesting right atrial pressure of 3 mmHg.            DM 2 -  Lab Results  Component Value Date   HGBA1C 11.2 (H) 12/02/2023   on Farxiga, Tresiba       COPD -   on baseline oxygen  2L,       Hx of DVT/PE on - anticoagulation with  Eliquis ,     CKD stage IIIb baseline Cr 3.0 Estimated Creatinine Clearance: 16.9 mL/min (A) (by C-G formula based on SCr of  3.04 mg/dL (H)).  Lab Results  Component Value Date   CREATININE 3.04 (H) 05/09/2024   CREATININE 3.64 (H) 04/27/2024   CREATININE 3.15 (H) 04/25/2024   Lab Results  Component Value Date   NA 141 05/09/2024   CL 108 05/09/2024   K 4.6 05/09/2024   CO2 27 05/09/2024   BUN 33 (H) 05/09/2024   CREATININE 3.04 (H) 05/09/2024   GFRNONAA 20 (L) 05/09/2024   CALCIUM 8.4 (L) 05/09/2024   PHOS 4.7 (H) 04/27/2024   ALBUMIN  2.9 (L) 04/25/2024   GLUCOSE 226 (H) 05/09/2024      BPH - on  Proscar        Chronic anemia - baseline hg Hemoglobin & Hematocrit  Recent Labs    04/27/24 0352 04/27/24 0931 05/09/24 1604  HGB 8.1* 8.4* 8.1*   Iron/TIBC/Ferritin/ %Sat    Component Value Date/Time   IRON 15 (L) 11/30/2023 1817   TIBC 223 (L) 11/30/2023 1817   FERRITIN 243 11/30/2023 1817   IRONPCTSAT 7 (L) 11/30/2023 1817     Cancer: History of lung cancer     While in ER:     Found to have evidence of necrotic pneumonia    Lab Orders         Culture, blood (routine x 2)         Basic metabolic panel         CBC         I-Stat Lactic Acid      CXR -  New round cavitary 5.3 cm opacity in the right mid lung, most likely infectious or inflammatory such as necrotizing pneumonia, with atypical infection also in the differential; follow-up after resolution of the acute process is recommended to exclude a persistent parenchymal mass. 2. Small left pleural effusion with basilar atelectasis.    CT chest -  1. New focal areas of irregular consolidation with cavitation and surrounding infiltration in both lungs, largest in the right middle lung (5.5 cm), likely infectious or inflammatory given the short interval since prior CT, with differential including necrotizing pneumonia, septic emboli, or atypical infection such as mycobacterial or fungal infection, and follow-up to resolution is recommended to exclude neoplastic disease. 2. Severe diffuse emphysematous changes with scattered  fibrosis, bronchiectasis, and bronchial wall thickening. 3. Enlarged mediastinal lymph nodes, likely reactive.  Following Medications were ordered in ER: Medications  ipratropium-albuterol  (DUONEB) 0.5-2.5 (3) MG/3ML nebulizer solution 3 mL (3 mLs Nebulization Given 05/09/24 1852)  cefTRIAXone  (ROCEPHIN ) 1 g in sodium chloride  0.9 % 100 mL IVPB (0 g Intravenous Stopped 05/09/24 1930)  azithromycin  (ZITHROMAX ) tablet 500 mg (500 mg Oral Given 05/09/24 1851)  methylPREDNISolone  sodium succinate (SOLU-MEDROL ) 125 mg/2 mL injection 125 mg (125 mg Intravenous Given 05/09/24 1938)     ED Triage Vitals  Encounter Vitals Group     BP 05/09/24 1450 (!) 162/67     Girls Systolic BP Percentile --      Girls Diastolic BP Percentile --      Boys Systolic BP Percentile --      Boys Diastolic BP Percentile --      Pulse Rate 05/09/24 1450 (!) 58     Resp 05/09/24 1450 18     Temp 05/09/24 1450 97.8 F (36.6 C)     Temp Source 05/09/24 1450 Oral     SpO2 05/09/24 1450 97 %     Weight 05/09/24 1451 145 lb 8.1 oz (66 kg)     Height 05/09/24 1451 5' 9 (1.753 m)     Head Circumference --      Peak Flow --      Pain Score 05/09/24 1451 0     Pain Loc --      Pain Education --      Exclude from Growth Chart --   UFJK(75)@     _________________________________________ Significant initial  Findings: Abnormal Labs Reviewed  BASIC METABOLIC PANEL WITH GFR - Abnormal; Notable for the following components:      Result Value   Glucose, Bld 226 (*)    BUN 33 (*)    Creatinine, Ser 3.04 (*)    Calcium 8.4 (*)    GFR, Estimated 20 (*)    All other components within normal limits  CBC - Abnormal; Notable for the following components:   RBC 2.69 (*)    Hemoglobin 8.1 (*)    HCT 26.1 (*)    All other components within normal limits  TROPONIN T, HIGH SENSITIVITY - Abnormal; Notable for the following components:   Troponin T High Sensitivity 156 (*)    All other components within normal limits  TROPONIN  T, HIGH SENSITIVITY - Abnormal; Notable for the following components:   Troponin T High Sensitivity 153 (*)    All other components within normal limits      _________________________ Troponin 156-153    ECG: Ordered Personally reviewed and interpreted by me showing: HR : 58 Rhythm: Sinus bradycardia with sinus arrhythmia Rightward axis T wave abnormality, consider inferolateral ischemia Abnormal ECG QTC 429  BNP (last 3 results) Recent Labs    06/28/23 1335 07/02/23 0259 11/29/23 1855  BNP 116.4* 190.9* 164.4*       The recent clinical data is shown below. Vitals:   05/09/24 1945 05/09/24 2000 05/09/24 2100 05/09/24 2146  BP:    (!) 155/99  Pulse: 67 62 (!) 58 (!) 56  Resp: 17 (!) 26 (!) 23 20  Temp:    98 F (36.7 C)  TempSrc:      SpO2: 100% 100% 100% 100%  Weight:      Height:         WBC     Component Value Date/Time   WBC 7.8 05/09/2024 1604   LYMPHSABS 1.1 04/24/2024 1104   MONOABS 0.7 04/24/2024 1104   EOSABS 0.4 04/24/2024 1104   BASOSABS 0.0 04/24/2024 1104     Lactic Acid, Venous    Component Value Date/Time   LATICACIDVEN 0.9 05/09/2024 2219       Procalcitonin   0.2   Results for orders placed or performed during the hospital encounter of 05/09/24  Respiratory (~20 pathogens) panel by PCR     Status: None   Collection Time: 05/09/24 10:57 PM   Specimen: Nasopharyngeal Swab; Respiratory  Result Value Ref  Range Status   Adenovirus NOT DETECTED NOT DETECTED Final   Coronavirus 229E NOT DETECTED NOT DETECTED Final    Comment: (NOTE) The Coronavirus on the Respiratory Panel, DOES NOT test for the novel  Coronavirus (2019 nCoV)    Coronavirus HKU1 NOT DETECTED NOT DETECTED Final   Coronavirus NL63 NOT DETECTED NOT DETECTED Final   Coronavirus OC43 NOT DETECTED NOT DETECTED Final   Metapneumovirus NOT DETECTED NOT DETECTED Final   Rhinovirus / Enterovirus NOT DETECTED NOT DETECTED Final   Influenza A NOT DETECTED NOT DETECTED Final    Influenza B NOT DETECTED NOT DETECTED Final   Parainfluenza Virus 1 NOT DETECTED NOT DETECTED Final   Parainfluenza Virus 2 NOT DETECTED NOT DETECTED Final   Parainfluenza Virus 3 NOT DETECTED NOT DETECTED Final   Parainfluenza Virus 4 NOT DETECTED NOT DETECTED Final   Respiratory Syncytial Virus NOT DETECTED NOT DETECTED Final   Bordetella pertussis NOT DETECTED NOT DETECTED Final   Bordetella Parapertussis NOT DETECTED NOT DETECTED Final   Chlamydophila pneumoniae NOT DETECTED NOT DETECTED Final   Mycoplasma pneumoniae NOT DETECTED NOT DETECTED Final    Comment: Performed at [facility], [address] [place], [place] [address]    ABX started Antibiotics Given (last 72 hours)     Date/Time Action Medication Dose Rate   05/09/24 1851 Given   azithromycin  (ZITHROMAX ) tablet 500 mg 500 mg    05/09/24 1900 New Bag/Given   cefTRIAXone  (ROCEPHIN ) 1 g in sodium chloride  0.9 % 100 mL IVPB 1 g 200 mL/hr   05/09/24 2331 New Bag/Given   ceFEPIme  (MAXIPIME ) 2 g in sodium chloride  0.9 % 100 mL IVPB 2 g 200 mL/hr   05/10/24 0025 New Bag/Given   metroNIDAZOLE  (FLAGYL ) IVPB 500 mg 500 mg 100 mL/hr   05/10/24 0140 New Bag/Given   vancomycin  (VANCOREADY) IVPB 1500 mg/300 mL 1,500 mg 150 mL/hr        No results found for the last 90 days.     __________________________________________________________ Recent Labs  Lab 05/09/24 1604 05/09/24 2208  NA 141  --   K 4.6  --   CO2 27  --   GLUCOSE 226*  --   BUN 33*  --   CREATININE 3.04*  --   CALCIUM 8.4*  --   MG  --  2.2  PHOS  --  3.0    Cr  stable,  Lab Results  Component Value Date   CREATININE 3.04 (H) 05/09/2024   CREATININE 3.64 (H) 04/27/2024   CREATININE 3.15 (H) 04/25/2024    No results for input(s): AST, ALT, ALKPHOS, BILITOT, PROT, ALBUMIN  in the last 168 hours. Lab Results  Component Value Date   CALCIUM 8.4 (L) 05/09/2024   PHOS 4.7 (H) 04/27/2024    Plt: Lab Results  Component Value  Date   PLT 313 05/09/2024    Recent Labs  Lab 05/09/24 1604  WBC 7.8  HGB 8.1*  HCT 26.1*  MCV 97.0  PLT 313    HG/HCT  stable,      Component Value Date/Time   HGB 8.1 (L) 05/09/2024 1604   HCT 26.1 (L) 05/09/2024 1604   MCV 97.0 05/09/2024 1604     _______________________________________________ Hospitalist was called for admission for cavitary pneumonia   The following Work up has been ordered so far:  Orders Placed This Encounter  Procedures   Culture, blood (routine x 2)   DG Chest 2 View   CT Chest Wo Contrast  Basic metabolic panel   CBC   Document Height and Actual Weight   If O2 Sat <94% administer O2 at 2 liters/minute via nasal cannula   Consult to hospitalist   Bipap   I-Stat Lactic Acid   ED EKG     OTHER Significant initial  Findings:  labs showing:     DM  labs:  HbA1C: Recent Labs    06/29/23 1019 12/02/23 0329  HGBA1C 9.9* 11.2*       CBG (last 3)  No results for input(s): GLUCAP in the last 72 hours.        Cultures:    Component Value Date/Time   SDES BLOOD LEFT ANTECUBITAL 06/17/2014 1205   SPECREQUEST BOTTLES DRAWN AEROBIC AND ANAEROBIC 5CCS 06/17/2014 1205   CULT  06/17/2014 1205    NO GROWTH 5 DAYS Performed at [facility]    REPTSTATUS 06/23/2014 FINAL 06/17/2014 1205     Radiological Exams on Admission: CT Chest Wo Contrast Result Date: 05/09/2024 EXAM: CT CHEST WITHOUT CONTRAST 05/09/2024 09:17:32 PM TECHNIQUE: CT of the chest was performed without the administration of intravenous contrast. Multiplanar reformatted images are provided for review. Automated exposure control, iterative reconstruction, and/or weight based adjustment of the mA/kV was utilized to reduce the radiation dose to as low as reasonably achievable. COMPARISON: Chest radiograph 04/25/2024, chest radiograph 05/09/2024, and CT chest 04/13/2024. CLINICAL HISTORY: SOB. Shortness of breath. Cough. FINDINGS: MEDIASTINUM: Heart: Normal heart  size. Trace pericardial effusion. Calcification of the coronary arteries. Aorta: Calcification of the aorta. Central airways: The central airways are clear. Thyroid  gland: Thyroid  gland is unremarkable. Esophagus: Esophagus is decompressed. LYMPH NODES: Large mediastinal lymph nodes with pretracheal lymph nodes measuring up to about 16 mm diameter. These are likely reactive. No hilar or axillary lymphadenopathy. LUNGS AND PLEURA: Severe diffuse emphysematous changes throughout the lungs with scattered fibrosis. Bronchiectasis and bronchial wall thickening. Scarring in the lung apices, greater on the right, unchanged since prior study. New focal areas of irregular consolidation with cavitation and surrounding infiltration demonstrated in both lungs. The largest lesion is in the right middle lung, measuring 5.5 cm diameter. There are some more smaller lesions in the superior segment of the left lower lobe, measuring 1.3 x 2.8 cm diameter. Left apical lesion measures 1.5 cm diameter. Given the short time frame for development of these lesions since the prior CT of 04/13/2024, this is most likely infectious or inflammatory. Consider necrotizing pneumonia, septic emboli, or atypical infection such as mycobacterial or fungal infection. Follow-up to resolution is recommended to exclude neoplastic disease. No pleural effusion or pneumothorax. SOFT TISSUES/BONES: Degenerative changes in the spine. No acute bony abnormalities. No acute abnormality of the soft tissues. UPPER ABDOMEN: Limited images of the upper abdomen demonstrate diffuse calcification in the pancreas, likely due to chronic pancreatitis. Otherwise, no acute abnormality. IMPRESSION: 1. New focal areas of irregular consolidation with cavitation and surrounding infiltration in both lungs, largest in the right middle lung (5.5 cm), likely infectious or inflammatory given the short interval since prior CT, with differential including necrotizing pneumonia, septic  emboli, or atypical infection such as mycobacterial or fungal infection, and follow-up to resolution is recommended to exclude neoplastic disease. 2. Severe diffuse emphysematous changes with scattered fibrosis, bronchiectasis, and bronchial wall thickening. 3. Enlarged mediastinal lymph nodes, likely reactive. Electronically signed by: Elsie Gravely MD 05/09/2024 09:27 PM EST RP Workstation: [id number]   DG Chest 2 View Result Date: 05/09/2024 EXAM: 2 VIEW(S) XRAY OF THE CHEST 05/09/2024 04:06:00 PM COMPARISON:  04/25/2024 CLINICAL HISTORY: FINDINGS: LUNGS AND PLEURA: Right apical scarring. New round cavitary 5.3 cm opacity in right mid lung. Given the short time of occurrence: This is most likely infectious or inflammatory, such as necrotizing pneumonia. Also consider atypical infections. Follow up after resolution of acute process is recommended to exclude persistent parenchymal mass. Small left pleural effusion with basilar atelectasis. Emphysematous changes and scattered fibrosis in the lungs. Right apical scarring and calcification. Similar linear opacities in left mid lung. No pneumothorax. HEART AND MEDIASTINUM: Aortic arch calcifications. No acute abnormality of the cardiac and mediastinal silhouettes. BONES AND SOFT TISSUES: No acute osseous abnormality. IMPRESSION: 1. New round cavitary 5.3 cm opacity in the right mid lung, most likely infectious or inflammatory such as necrotizing pneumonia, with atypical infection also in the differential; follow-up after resolution of the acute process is recommended to exclude a persistent parenchymal mass. 2. Small left pleural effusion with basilar atelectasis. Electronically signed by: Elsie Gravely MD 05/09/2024 04:41 PM EST RP Workstation: [id number]   _______________________________________________________________________________________________________ Latest  Blood pressure (!) 155/99, pulse (!) 56, temperature 98 F (36.7 C), resp. rate 20, height  5' 9 (1.753 m), weight 66 kg, SpO2 100%.   Vitals  labs and radiology finding personally reviewed  Review of Systems:    Pertinent positives include:   fatigue,weight loss cough shortness of breath at rest.  productive cough Constitutional:  No weight loss, night sweats, Fevers, chills,  HEENT:  No headaches, Difficulty swallowing,Tooth/dental problems,Sore throat,  No sneezing, itching, ear ache, nasal congestion, post nasal drip,  Cardio-vascular:  No chest pain, Orthopnea, PND, anasarca, dizziness, palpitations.no Bilateral lower extremity swelling  GI:  No heartburn, indigestion, abdominal pain, nausea, vomiting, diarrhea, change in bowel habits, loss of appetite, melena, blood in stool, hematemesis Resp:   No dyspnea on exertion, No excess mucus,  , No non-productive cough, No coughing up of blood.No change in color of mucus.No wheezing. Skin:  no rash or lesions. No jaundice GU:  no dysuria, change in color of urine, no urgency or frequency. No straining to urinate.  No flank pain.  Musculoskeletal:  No joint pain or no joint swelling. No decreased range of motion. No back pain.  Psych:  No change in mood or affect. No depression or anxiety. No memory loss.  Neuro: no localizing neurological complaints, no tingling, no weakness, no double vision, no gait abnormality, no slurred speech, no confusion  All systems reviewed and apart from HOPI all are negative _______________________________________________________________________________________________ Past Medical History:   Past Medical History:  Diagnosis Date   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)    Aortic atherosclerosis    Back pain 06/17/2014   Chronic lung disease    CKD (chronic kidney disease)    COPD (chronic obstructive pulmonary disease) (HCC) 07/23/2014   Essential hypertension    Gout    History of lung cancer    History of pancreatitis    Iron deficiency anemia    Lumbar disc disease    Mixed  hyperlipidemia    Notalgia    Prostate hypertrophy    Pulmonary nodules 07/23/2014   Swelling    Tobacco use disorder 07/23/2014   Tumor of lung 01/26/2021   Type 2 diabetes mellitus without complication    Uncontrolled type 2 diabetes mellitus with hyperglycemia, without long-term current use of insulin  [facility])     Past Surgical History:  Procedure Laterality Date   BACK SURGERY      Social History:  Ambulatory   independently  reports that he has been smoking cigarettes. He started smoking about 62 years ago. He has a 90 pack-year smoking history. He uses smokeless tobacco. He reports that he does not currently use alcohol after a past usage of about 3.0 standard drinks of alcohol per week. He reports that he does not use drugs.   Family History:   Family History  Problem Relation Age of Onset   Lung cancer Mother    Lung cancer Father    ______________________________________________________________________________________________ Allergies: Allergies[1]   Prior to Admission medications  Medication Sig Start Date End Date Taking? Authorizing Provider  albuterol  (VENTOLIN  HFA) 108 (90 Base) MCG/ACT inhaler Inhale 1-2 puffs into the lungs every 6 (six) hours as needed for wheezing or shortness of breath. 12/02/23   Perri DELENA Meliton Randall., MD  allopurinol (ZYLOPRIM) 100 MG tablet Take 100 mg by mouth every morning. 02/25/22   [provider]  amLODipine  (NORVASC ) 10 MG tablet Take 1 tablet (10 mg total) by mouth daily. 04/28/24 05/28/24  Leotis Bogus, MD  apixaban  (ELIQUIS ) 5 MG TABS tablet Take 1 tablet (5 mg total) by mouth 2 (two) times daily. Start this once the eliquis  starter pack is complete. 01/02/24   Perri DELENA Meliton Randall., MD  carvedilol  (COREG ) 12.5 MG tablet Take 1 tablet (12.5 mg total) by mouth 2 (two) times daily with a meal. 04/27/24 05/27/24  Leotis Bogus, MD  Cyanocobalamin (VITAMIN B-12 CR) 1500 MCG TBCR Take 1,500 mcg by mouth daily.    [provider]  dapagliflozin propanediol (FARXIGA) 10 MG TABS tablet Take 10 mg by mouth daily. 09/03/21   [provider]  DM-Doxylamine-Acetaminophen  (NYQUIL COLD & FLU PO) Take 1 Dose by mouth every 6 (six) hours as needed (cold/cough).    [provider]  finasteride  (PROSCAR ) 5 MG tablet Take 5 mg by mouth daily. 02/25/22   [provider]  Fluticasone -Umeclidin-Vilant (TRELEGY ELLIPTA ) 200-62.5-25 MCG/ACT AEPB Inhale 1 puff into the lungs daily. 05/03/24 05/03/25  Mannam, Praveen, MD  guaifenesin  (ROBITUSSIN) 100 MG/5ML syrup Take 200 mg by mouth 3 (three) times daily as needed for cough.    [provider]  insulin  degludec (TRESIBA ) 100 UNIT/ML FlexTouch Pen Inject 25 Units into the skin daily. 12/02/23   Perri DELENA Meliton Randall., MD  ipratropium-albuterol  (DUONEB) 0.5-2.5 (3) MG/3ML SOLN Take 3 mLs by nebulization every 4 (four) hours as needed (Wheezing, SOB). 04/13/24   Ula Prentice SAUNDERS, MD  Multiple Vitamins-Minerals (MULTIVITAMIN WITH MINERALS) tablet Take 1 tablet by mouth daily.    [provider]  Omega-3 Fatty Acids (FISH OIL) 1200 MG CAPS Take 1,200 mg by mouth daily.    [provider]  Phenyleph-Doxylamine-DM-APAP (ALKA-SELTZER PLS ALLERGY & CGH PO) Take 1 Dose by mouth every 4 (four) hours as needed (cold/cough).    [provider]  predniSONE  (DELTASONE ) 10 MG tablet Take 6 tablets (60 mg total) by mouth daily with breakfast. Take 60 mg/day on day 1 and reduce dose by 10 mg every 2 days until taper is complete 05/03/24   Mannam, Praveen, MD  Pseudoephedrine-APAP-DM (DAYQUIL MULTI-SYMPTOM COLD/FLU PO) Take 1 Dose by mouth every 6 (six) hours as needed (cold/cough).    [provider]  Pseudoephedrine-Ibuprofen (ADVIL COLD/SINUS PO) Take 1 Dose by mouth every 6 (six) hours as needed (cold/sinus).    [provider]  SAMBUCOL BLACK ELDERBERRY PO Take 1 Dose by mouth daily as needed (immune support).    [provider]  simvastatin  (ZOCOR ) 10  MG tablet Take 10 mg by mouth at bedtime. 02/25/22   [provider]  tamsulosin  (FLOMAX ) 0.4 MG CAPS capsule Take 0.8 mg by mouth at bedtime. 02/25/22   [provider]    ___________________________________________________________________________________________________ Physical Exam:    05/09/2024    9:46 PM 05/09/2024    9:00 PM 05/09/2024    8:00 PM  Vitals with BMI  Systolic 155    Diastolic 99    Pulse 56 58 62     1. General:  in No  Acute distress    Chronically ill   -appearing 2. Psychological: Alert and   Oriented 3. Head/ENT:    Dry Mucous Membranes                          Head Non traumatic, neck supple                         Poor Dentition 4. SKIN:  decreased Skin turgor,  Skin clean Dry and intact no rash    5. Heart: Regular rate and rhythm no  Murmur, no Rub or gallop 6. Lungs:  no wheezes or crackles   7. Abdomen: Soft,  non-tender, Non distended  bowel sounds present 8. Lower extremities: no clubbing, cyanosis, no  edema 9. Neurologically Grossly intact, moving all 4 extremities equally   10. MSK: Normal range of motion    Chart has been reviewed  ______________________________________________________________________________________________  Assessment/Plan 84 y.o. male with medical history significant of   COPD, CKD 3b, HTN, DM2, hx. lung cancer s/p radiation, gout, chronic normocytic anemia, history of pulmonary embolism in 2024 on Eliquis    Admitted for cavitary pneumonia  Present on Admission:  Acute respiratory failure with hypoxia (HCC)  BPH (benign prostatic hyperplasia)  COPD (chronic obstructive pulmonary disease) (HCC)  Essential hypertension  History of BPH  Pulmonary embolism (HCC)     Acute respiratory failure with hypoxia (HCC) Chronically on 2 L Continue to titrate O2 as needed Increased oxygen  requirement in the setting of necrotizing pneumonia with cavitary  lesion  BPH (benign prostatic hyperplasia) Continue Flomax   Cavitary pneumonia Sent message to ID consult appreciated the involvement. Check MRSA Sent [employer] Sputum and blood cultures  acid-fast's cultures For tonight cover cefepime , flagyl  and vancomycin  Check for Legionella and strep pneumo Airborne precautions for now Repeat echo   COPD (chronic obstructive pulmonary disease) (HCC) Continue home medications hold off on prednisone  for tonight  Essential hypertension Continue Norvasc  10 mg a day Coreg  12.5 mg twice daily  History of BPH Continue home meds  Insulin  dependent type 2 diabetes mellitus (HCC) Order sliding scale decrease Tresiba  down to 10 units  Pulmonary embolism (HCC) For now we will continue Eliquis  2 times daily    Other plan as per orders.  DVT prophylaxis:  eliquis   Code Status:    Code Status: Prior FULL CODE  as per patient   I had personally discussed CODE STATUS with patient and family*  ACP   none    Family Communication:   Family not at  Bedside    Diet diabetic   Disposition Plan:       To home once workup is complete and patient is stable   Following barriers for discharge:  able to transition to PO antibiotics                                             Will need consultants to evaluate patient prior to discharge                        Consult Orders  (From admission, onward)           Start     Ordered   05/09/24 1834  Consult to hospitalist  PG SENT  BY DELORIS 18:47  Once       Provider:  (Not yet assigned)  Question Answer Comment  Place call to: [facility]   Reason for Consult Admit      05/09/24 1833           Would benefit from PT/OT eval prior to DC  Ordered                   Swallow eval - SLP ordered                    Consults called: ID consult   Admission status:  ED Disposition     ED Disposition  Admit   Condition  --    Comment  Hospital [facility] [[id number]]  Level of Care: Progressive [102]  Admit to Progressive based on following criteria: RESPIRATORY PROBLEMS hypoxemic/hypercapnic respiratory failure that is responsive to NIPPV (BiPAP) or High Flow Nasal Cannula (6-80 lpm). Frequent assessment/intervention, no > Q2 hrs < Q4 hrs, to maintain oxygenation and pulmonary hygiene.  May admit patient to [facility] or [facility] if equivalent level of care is available:: No  Diagnosis: Cavitary pneumonia [[id number]]  Admitting Physician: Sitlaly Gudiel [[id number]]  Attending Physician: Ben Sanz [[id number]]  Certification:: I certify this patient will need inpatient services for at least 2 midnights  Expected Medical Readiness: 05/11/2024           inpatient     I Expect 2 midnight stay secondary to severity of patient's current illness need for inpatient interventions justified by the following:     Severe lab/radiological/exam abnormalities including:    There are no diagnoses linked to this encounter.  Chronic anticoagulation  That are currently affecting medical management.   I expect  patient to be hospitalized for 2 midnights requiring inpatient medical care.  Patient is at high risk for adverse outcome (such as loss of life or disability) if not treated.  Indication for inpatient stay as follows  Need for operative/procedural  intervention    Need for IV antibiotics, IV fluids,,     Level of care      progressive       Anneta Rounds 05/10/2024, 1:51 AM    [facility]     after 2 AM please page floor coverage   If 7AM-7PM, please contact the day team taking care of the patient using [email or web address]        [1]  Allergies Allergen Reactions   Chantix  Continuing Month [Varenicline ] Other (See Comments)    Doesn't work for patient   Januvia [Sitagliptin] Other (See Comments)    Class Contraindicated   Lisinopril Swelling    Possible tongue  swelling. ARB's okay   Motrin [Ibuprofen] Other (See Comments)    Contraindicated by Renal disease  Remeron [Mirtazapine] Other (See Comments)    Excess somnolence on the 15 mg dose   Penicillins Other (See Comments)    Facial swelling   "

## 2024-05-09 NOTE — Assessment & Plan Note (Signed)
 Continue Norvasc  10 mg a day Coreg  12.5 mg twice daily

## 2024-05-09 NOTE — Assessment & Plan Note (Signed)
 Continue Flomax 

## 2024-05-09 NOTE — Assessment & Plan Note (Addendum)
 Sent message to ID consult appreciated the involvement. Check MRSA Sent AFB Sputum and blood cultures  acid-fast's cultures For tonight cover cefepime , flagyl  and vancomycin  Check for Legionella and strep pneumo Airborne precautions for now Repeat echo

## 2024-05-09 NOTE — ED Provider Notes (Signed)
 " [place] EMERGENCY DEPARTMENT AT [facility] Provider Note   CSN: [id number] Arrival date & time: 05/09/24  1444     Patient presents with: Shortness of Breath and Cough   Jeffrey Randall. is a 84 y.o. male.  {Add pertinent medical, surgical, social history, OB history to HPI:6232} 84 year old male presents for evaluation of shortness of breath and cough.  He has a history of COPD.  Usually wears 2 L nasal cannula as needed but has been wearing it constantly lately.  States cough and shortness of breath has gotten worse and he is no longer able to walk around.  He has had multiple ER visits for this.  Denies fevers or any other symptoms or concerns.   Shortness of Breath Associated symptoms: cough   Associated symptoms: no abdominal pain, no chest pain, no ear pain, no fever, no rash, no sore throat and no vomiting   Cough Associated symptoms: shortness of breath   Associated symptoms: no chest pain, no chills, no ear pain, no fever, no rash and no sore throat        Prior to Admission medications  Medication Sig Start Date End Date Taking? Authorizing Provider  albuterol  (VENTOLIN  HFA) 108 (90 Base) MCG/ACT inhaler Inhale 1-2 puffs into the lungs every 6 (six) hours as needed for wheezing or shortness of breath. 12/02/23   Perri DELENA Meliton Mickey., MD  allopurinol (ZYLOPRIM) 100 MG tablet Take 100 mg by mouth every morning. 02/25/22   [provider]  amLODipine  (NORVASC ) 10 MG tablet Take 1 tablet (10 mg total) by mouth daily. 04/28/24 05/28/24  Leotis Bogus, MD  apixaban  (ELIQUIS ) 5 MG TABS tablet Take 1 tablet (5 mg total) by mouth 2 (two) times daily. Start this once the eliquis  starter pack is complete. 01/02/24   Perri DELENA Meliton Mickey., MD  carvedilol  (COREG ) 12.5 MG tablet Take 1 tablet (12.5 mg total) by mouth 2 (two) times daily with a meal. 04/27/24 05/27/24  Leotis Bogus, MD  Cyanocobalamin (VITAMIN B-12 CR) 1500 MCG TBCR Take 1,500 mcg by mouth  daily.    [provider]  dapagliflozin propanediol (FARXIGA) 10 MG TABS tablet Take 10 mg by mouth daily. 09/03/21   [provider]  DM-Doxylamine-Acetaminophen  (NYQUIL COLD & FLU PO) Take 1 Dose by mouth every 6 (six) hours as needed (cold/cough).    [provider]  finasteride  (PROSCAR ) 5 MG tablet Take 5 mg by mouth daily. 02/25/22   [provider]  Fluticasone -Umeclidin-Vilant (TRELEGY ELLIPTA ) 200-62.5-25 MCG/ACT AEPB Inhale 1 puff into the lungs daily. 05/03/24 05/03/25  Mannam, Praveen, MD  guaifenesin  (ROBITUSSIN) 100 MG/5ML syrup Take 200 mg by mouth 3 (three) times daily as needed for cough.    [provider]  insulin  degludec (TRESIBA ) 100 UNIT/ML FlexTouch Pen Inject 25 Units into the skin daily. 12/02/23   Perri DELENA Meliton Mickey., MD  ipratropium-albuterol  (DUONEB) 0.5-2.5 (3) MG/3ML SOLN Take 3 mLs by nebulization every 4 (four) hours as needed (Wheezing, SOB). 04/13/24   Ula Prentice SAUNDERS, MD  Multiple Vitamins-Minerals (MULTIVITAMIN WITH MINERALS) tablet Take 1 tablet by mouth daily.    [provider]  Omega-3 Fatty Acids (FISH OIL) 1200 MG CAPS Take 1,200 mg by mouth daily.    [provider]  Phenyleph-Doxylamine-DM-APAP (ALKA-SELTZER PLS ALLERGY & CGH PO) Take 1 Dose by mouth every 4 (four) hours as needed (cold/cough).    [provider]  predniSONE  (DELTASONE ) 10 MG tablet Take 6 tablets (60  mg total) by mouth daily with breakfast. Take 60 mg/day on day 1 and reduce dose by 10 mg every 2 days until taper is complete 05/03/24   Mannam, Praveen, MD  Pseudoephedrine-APAP-DM (DAYQUIL MULTI-SYMPTOM COLD/FLU PO) Take 1 Dose by mouth every 6 (six) hours as needed (cold/cough).    [provider]  Pseudoephedrine-Ibuprofen (ADVIL COLD/SINUS PO) Take 1 Dose by mouth every 6 (six) hours as needed (cold/sinus).    [provider]  SAMBUCOL BLACK ELDERBERRY PO Take 1 Dose by mouth daily as needed (immune  support).    [provider]  simvastatin  (ZOCOR ) 10 MG tablet Take 10 mg by mouth at bedtime. 02/25/22   [provider]  tamsulosin  (FLOMAX ) 0.4 MG CAPS capsule Take 0.8 mg by mouth at bedtime. 02/25/22   [provider]    Allergies: Chantix  continuing month [varenicline ], Januvia [sitagliptin], Lisinopril, Motrin [ibuprofen], Remeron [mirtazapine], and Penicillins    Review of Systems  Constitutional:  Negative for chills and fever.  HENT:  Negative for ear pain and sore throat.   Eyes:  Negative for pain and visual disturbance.  Respiratory:  Positive for cough and shortness of breath.   Cardiovascular:  Negative for chest pain and palpitations.  Gastrointestinal:  Negative for abdominal pain and vomiting.  Genitourinary:  Negative for dysuria and hematuria.  Musculoskeletal:  Negative for arthralgias and back pain.  Skin:  Negative for color change and rash.  Neurological:  Negative for seizures and syncope.  All other systems reviewed and are negative.   Updated Vital Signs BP (!) 185/92   Pulse (!) 57   Temp 97.6 F (36.4 C) (Oral)   Resp (!) 32   Ht 5' 9 (1.753 m)   Wt 66 kg   SpO2 100%   BMI 21.49 kg/m   Physical Exam Vitals and nursing note reviewed.  Constitutional:      General: He is not in acute distress.    Appearance: He is well-developed. He is ill-appearing.  HENT:     Head: Normocephalic and atraumatic.  Eyes:     Conjunctiva/sclera: Conjunctivae normal.  Cardiovascular:     Rate and Rhythm: Normal rate and regular rhythm.     Heart sounds: No murmur heard. Pulmonary:     Effort: Tachypnea present. No respiratory distress.     Breath sounds: Decreased breath sounds, wheezing and rhonchi present.  Abdominal:     Palpations: Abdomen is soft.     Tenderness: There is no abdominal tenderness.  Musculoskeletal:        General: No swelling.     Cervical back: Neck supple.  Skin:    General: Skin is warm and dry.      Capillary Refill: Capillary refill takes less than 2 seconds.  Neurological:     General: No focal deficit present.     Mental Status: He is alert.  Psychiatric:        Mood and Affect: Mood normal.     (all labs ordered are listed, but only abnormal results are displayed) Labs Reviewed  BASIC METABOLIC PANEL WITH GFR - Abnormal; Notable for the following components:      Result Value   Glucose, Bld 226 (*)    BUN 33 (*)    Creatinine, Ser 3.04 (*)    Calcium 8.4 (*)    GFR, Estimated 20 (*)    All other components within normal limits  CBC - Abnormal; Notable for the following components:   RBC 2.69 (*)  Hemoglobin 8.1 (*)    HCT 26.1 (*)    All other components within normal limits  TROPONIN T, HIGH SENSITIVITY - Abnormal; Notable for the following components:   Troponin T High Sensitivity 156 (*)    All other components within normal limits  TROPONIN T, HIGH SENSITIVITY    EKG: None  Radiology: DG Chest 2 View Result Date: 05/09/2024 EXAM: 2 VIEW(S) XRAY OF THE CHEST 05/09/2024 04:06:00 PM COMPARISON: 04/25/2024 CLINICAL HISTORY: FINDINGS: LUNGS AND PLEURA: Right apical scarring. New round cavitary 5.3 cm opacity in right mid lung. Given the short time of occurrence: This is most likely infectious or inflammatory, such as necrotizing pneumonia. Also consider atypical infections. Follow up after resolution of acute process is recommended to exclude persistent parenchymal mass. Small left pleural effusion with basilar atelectasis. Emphysematous changes and scattered fibrosis in the lungs. Right apical scarring and calcification. Similar linear opacities in left mid lung. No pneumothorax. HEART AND MEDIASTINUM: Aortic arch calcifications. No acute abnormality of the cardiac and mediastinal silhouettes. BONES AND SOFT TISSUES: No acute osseous abnormality. IMPRESSION: 1. New round cavitary 5.3 cm opacity in the right mid lung, most likely infectious or inflammatory such as  necrotizing pneumonia, with atypical infection also in the differential; follow-up after resolution of the acute process is recommended to exclude a persistent parenchymal mass. 2. Small left pleural effusion with basilar atelectasis. Electronically signed by: Elsie Gravely MD 05/09/2024 04:41 PM EST RP Workstation: [id number]    {Document cardiac monitor, telemetry assessment procedure when appropriate:32947} Procedures   Medications Ordered in the ED  ipratropium-albuterol  (DUONEB) 0.5-2.5 (3) MG/3ML nebulizer solution 3 mL (has no administration in time range)  cefTRIAXone  (ROCEPHIN ) 1 g in sodium chloride  0.9 % 100 mL IVPB (has no administration in time range)  azithromycin  (ZITHROMAX ) tablet 500 mg (has no administration in time range)      {Click here for ABCD2, HEART and other calculators REFRESH Note before signing:1}                              Medical Decision Making Amount and/or Complexity of Data Reviewed Labs: ordered. Radiology: ordered.  Risk Prescription drug management.   ***  {Document critical care time when appropriate  Document review of labs and clinical decision tools ie CHADS2VASC2, etc  Document your independent review of radiology images and any outside records  Document your discussion with family members, caretakers and with consultants  Document social determinants of health affecting pt's care  Document your decision making why or why not admission, treatments were needed:32947:::1}   Final diagnoses:  None    ED Discharge Orders     None        "

## 2024-05-09 NOTE — H&P (Incomplete)
 "    Jeffrey Randall. FMW:[id number] DOB: 05/31/1940 DOA: 05/09/2024     PCP: Sheldon Netter, PA   Outpatient Specialists: * NONE CARDS: * Dr. None  NEphrology: *  Dr. No care team member to display  NEurology *   Dr. Pulmonary *  Dr.  Oncology * Dr.No care team member to display  GI* Dr.  Gwen, LB) No care team member to display Urology Dr. *  Patient arrived to ER on 05/09/24 at 1444 Referred by Attending Gennaro Duwaine CROME, DO   Patient coming from:    home Lives alone,   *** With family From facility *** SNF, [place], [place]    Chief Complaint:   Chief Complaint  Patient presents with   Shortness of Breath   Cough    HPI: Jeffrey Randall. is a 84 y.o. male with medical history significant of   COPD, CKD 3b, HTN, DM2, hx. lung cancer s/p radiation, gout, chronic normocytic anemia, history of pulmonary embolism in 2024 on Eliquis     Presented with worsening shortness of breath cough Patient presented with coughing and shortness of breath patient has known history of COPD foot which he wears 2 L of oxygen .  Symptoms has progressed and he is now having trouble ambulating no associated fevers no abdominal pain no chest pain no fever nausea or vomiting he is on Eliquis  in emergency department patient appears to be tachypneic and wheezing with slightly elevated troponin 150s but stable he was given DuoNeb Imaging which showed cavitary lesion on the right middle lung suspicious for necrotizing pneumonia versus septic emboli Started on Rocephin  azithromycin     Patient was just admitted recently with influenza A and bronchitis stayed between 13th to 16 January.  He received a course of steroids  Denies significant ETOH intake *** Does not smoke*** but interested in quitting***  Denies marijuana use ***    Regarding pertinent Chronic problems:    Hyperlipidemia -  on statins Zocor      HTN on Norvasc  Coreg  Diovan   chronic CHF diastolic/systolic/ combined - last echo   Recent Results (from the past 56199 hours)  ECHOCARDIOGRAM COMPLETE   Collection Time: 11/30/23  5:33 PM  Result Value   Weight 2,240   Height 70   BP 141/44   Single Plane A2C EF 53.5   Single Plane A4C EF 57.4   Calc EF 54.8   S' Lateral 3.00   Area-P 1/2 3.17   Est EF 60 - 65%   Narrative      ECHOCARDIOGRAM REPORT      IMPRESSIONS    1. Left ventricular ejection fraction, by estimation, is 60 to 65%. The left ventricle has normal function. The left ventricle has no regional wall motion abnormalities. There is mild concentric left ventricular hypertrophy. Left ventricular diastolic  parameters are consistent with Grade I diastolic dysfunction (impaired relaxation).  2. Right ventricular systolic function is normal. The right ventricular size is normal.  3. The mitral valve is normal in structure. Trivial mitral valve regurgitation. No evidence of mitral stenosis.  4. The aortic valve is tricuspid. There is moderate calcification of the aortic valve. Aortic valve regurgitation is not visualized. Aortic valve sclerosis/calcification is present, without any evidence of aortic stenosis.  5. The inferior vena cava is normal in size with greater than 50% respiratory variability, suggesting right atrial pressure of 3 mmHg.            DM 2 -  Lab Results  Component Value Date   HGBA1C 11.2 (H) 12/02/2023   on Farxiga, Tresiba       COPD -   on baseline oxygen  2L,       Hx of DVT/PE on - anticoagulation with  Eliquis ,     CKD stage IIIb baseline Cr 3.0 Estimated Creatinine Clearance: 16.9 mL/min (A) (by C-G formula based on SCr of 3.04 mg/dL (H)).  Lab Results  Component Value Date   CREATININE 3.04 (H) 05/09/2024   CREATININE 3.64 (H) 04/27/2024   CREATININE 3.15 (H) 04/25/2024   Lab Results  Component Value Date   NA 141 05/09/2024   CL 108 05/09/2024   K 4.6 05/09/2024   CO2 27 05/09/2024   BUN 33 (H) 05/09/2024   CREATININE 3.04 (H) 05/09/2024   GFRNONAA 20  (L) 05/09/2024   CALCIUM 8.4 (L) 05/09/2024   PHOS 4.7 (H) 04/27/2024   ALBUMIN  2.9 (L) 04/25/2024   GLUCOSE 226 (H) 05/09/2024      BPH - on  Proscar        Chronic anemia - baseline hg Hemoglobin & Hematocrit  Recent Labs    04/27/24 0352 04/27/24 0931 05/09/24 1604  HGB 8.1* 8.4* 8.1*   Iron/TIBC/Ferritin/ %Sat    Component Value Date/Time   IRON 15 (L) 11/30/2023 1817   TIBC 223 (L) 11/30/2023 1817   FERRITIN 243 11/30/2023 1817   IRONPCTSAT 7 (L) 11/30/2023 1817     Cancer: History of lung cancer     While in ER:     Found to have evidence of necrotic pneumonia    Lab Orders         Culture, blood (routine x 2)         Basic metabolic panel         CBC         I-Stat Lactic Acid      CXR -  New round cavitary 5.3 cm opacity in the right mid lung, most likely infectious or inflammatory such as necrotizing pneumonia, with atypical infection also in the differential; follow-up after resolution of the acute process is recommended to exclude a persistent parenchymal mass. 2. Small left pleural effusion with basilar atelectasis.    CT chest -  1. New focal areas of irregular consolidation with cavitation and surrounding infiltration in both lungs, largest in the right middle lung (5.5 cm), likely infectious or inflammatory given the short interval since prior CT, with differential including necrotizing pneumonia, septic emboli, or atypical infection such as mycobacterial or fungal infection, and follow-up to resolution is recommended to exclude neoplastic disease. 2. Severe diffuse emphysematous changes with scattered fibrosis, bronchiectasis, and bronchial wall thickening. 3. Enlarged mediastinal lymph nodes, likely reactive.  Following Medications were ordered in ER: Medications  ipratropium-albuterol  (DUONEB) 0.5-2.5 (3) MG/3ML nebulizer solution 3 mL (3 mLs Nebulization Given 05/09/24 1852)  cefTRIAXone  (ROCEPHIN ) 1 g in sodium chloride  0.9 % 100 mL IVPB  (0 g Intravenous Stopped 05/09/24 1930)  azithromycin  (ZITHROMAX ) tablet 500 mg (500 mg Oral Given 05/09/24 1851)  methylPREDNISolone  sodium succinate (SOLU-MEDROL ) 125 mg/2 mL injection 125 mg (125 mg Intravenous Given 05/09/24 1938)     ED Triage Vitals  Encounter Vitals Group     BP 05/09/24 1450 (!) 162/67     Girls Systolic BP Percentile --      Girls Diastolic BP Percentile --      Boys Systolic BP Percentile --      Boys Diastolic BP Percentile --  Pulse Rate 05/09/24 1450 (!) 58     Resp 05/09/24 1450 18     Temp 05/09/24 1450 97.8 F (36.6 C)     Temp Source 05/09/24 1450 Oral     SpO2 05/09/24 1450 97 %     Weight 05/09/24 1451 145 lb 8.1 oz (66 kg)     Height 05/09/24 1451 5' 9 (1.753 m)     Head Circumference --      Peak Flow --      Pain Score 05/09/24 1451 0     Pain Loc --      Pain Education --      Exclude from Growth Chart --   UFJK(75)@     _________________________________________ Significant initial  Findings: Abnormal Labs Reviewed  BASIC METABOLIC PANEL WITH GFR - Abnormal; Notable for the following components:      Result Value   Glucose, Bld 226 (*)    BUN 33 (*)    Creatinine, Ser 3.04 (*)    Calcium 8.4 (*)    GFR, Estimated 20 (*)    All other components within normal limits  CBC - Abnormal; Notable for the following components:   RBC 2.69 (*)    Hemoglobin 8.1 (*)    HCT 26.1 (*)    All other components within normal limits  TROPONIN T, HIGH SENSITIVITY - Abnormal; Notable for the following components:   Troponin T High Sensitivity 156 (*)    All other components within normal limits  TROPONIN T, HIGH SENSITIVITY - Abnormal; Notable for the following components:   Troponin T High Sensitivity 153 (*)    All other components within normal limits      _________________________ Troponin 156-153    ECG: Ordered Personally reviewed and interpreted by me showing: HR : 58 Rhythm: Sinus bradycardia with sinus arrhythmia Rightward axis T  wave abnormality, consider inferolateral ischemia Abnormal ECG QTC 429  BNP (last 3 results) Recent Labs    06/28/23 1335 07/02/23 0259 11/29/23 1855  BNP 116.4* 190.9* 164.4*       The recent clinical data is shown below. Vitals:   05/09/24 1945 05/09/24 2000 05/09/24 2100 05/09/24 2146  BP:    (!) 155/99  Pulse: 67 62 (!) 58 (!) 56  Resp: 17 (!) 26 (!) 23 20  Temp:    98 F (36.7 C)  TempSrc:      SpO2: 100% 100% 100% 100%  Weight:      Height:         WBC     Component Value Date/Time   WBC 7.8 05/09/2024 1604   LYMPHSABS 1.1 04/24/2024 1104   MONOABS 0.7 04/24/2024 1104   EOSABS 0.4 04/24/2024 1104   BASOSABS 0.0 04/24/2024 1104     Lactic Acid, Venous    Component Value Date/Time   LATICACIDVEN 0.9 05/09/2024 2219       Procalcitonin *** Ordered      UA *** no evidence of UTI  ***Pending ***not ordered   Urine analysis:    Component Value Date/Time   COLORURINE YELLOW 11/29/2023 2041   APPEARANCEUR HAZY (A) 11/29/2023 2041   LABSPEC 1.014 11/29/2023 2041   PHURINE 5.0 11/29/2023 2041   GLUCOSEU >=500 (A) 11/29/2023 2041   HGBUR SMALL (A) 11/29/2023 2041   BILIRUBINUR NEGATIVE 11/29/2023 2041   KETONESUR NEGATIVE 11/29/2023 2041   PROTEINUR >=300 (A) 11/29/2023 2041   UROBILINOGEN 0.2 06/17/2014 1257   NITRITE NEGATIVE 11/29/2023 2041   LEUKOCYTESUR NEGATIVE 11/29/2023 2041  Results for orders placed or performed during the hospital encounter of 04/24/24  Resp panel by RT-PCR (RSV, Flu A&B, Covid) Anterior Nasal Swab     Status: Abnormal   Collection Time: 04/24/24 11:00 AM   Specimen: Anterior Nasal Swab  Result Value Ref Range Status   SARS Coronavirus 2 by RT PCR NEGATIVE NEGATIVE Final   Influenza A by PCR POSITIVE (A) NEGATIVE Final   Influenza B by PCR NEGATIVE NEGATIVE Final    Comment: (NOTE) The Xpert Xpress SARS-CoV-2/FLU/RSV plus assay is intended as an aid in the diagnosis of influenza from Nasopharyngeal swab specimens  and should not be used as a sole basis for treatment. Nasal washings and aspirates are unacceptable for Xpert Xpress SARS-CoV-2/FLU/RSV testing.  Fact Sheet for Patients: bloggercourse.com  Fact Sheet for Healthcare Providers: seriousbroker.it  This test is not yet approved or cleared by the [place]  FDA and has been authorized for detection and/or diagnosis of SARS-CoV-2 by FDA under an Emergency Use Authorization (EUA). This EUA will remain in effect (meaning this test can be used) for the duration of the COVID-19 declaration under Section 564(b)(1) of the Act, 21 U.S.C. section 360bbb-3(b)(1), unless the authorization is terminated or revoked.     Resp Syncytial Virus by PCR NEGATIVE NEGATIVE Final    Comment: (NOTE) Fact Sheet for Patients: bloggercourse.com  Fact Sheet for Healthcare Providers: seriousbroker.it  This test is not yet approved or cleared by the [place]  FDA and has been authorized for detection and/or diagnosis of SARS-CoV-2 by FDA under an Emergency Use Authorization (EUA). This EUA will remain in effect (meaning this test can be used) for the duration of the COVID-19 declaration under Section 564(b)(1) of the Act, 21 U.S.C. section 360bbb-3(b)(1), unless the authorization is terminated or revoked.  Performed at [facility], [address], [place], [place] [address]     ABX started Antibiotics Given (last 72 hours)     Date/Time Action Medication Dose Rate   05/09/24 1851 Given   azithromycin  (ZITHROMAX ) tablet 500 mg 500 mg    05/09/24 1900 New Bag/Given   cefTRIAXone  (ROCEPHIN ) 1 g in sodium chloride  0.9 % 100 mL IVPB 1 g 200 mL/hr        No results found for the last 90 days.    ________________________________________________________________  Arterial ***Venous  Blood Gas result:  pH *** pCO2 ***; pO2 ***;     %O2 Sat  ***.  ABG    Component Value Date/Time   PHART 7.28 (L) 07/02/2023 0856   PCO2ART 37 07/02/2023 0856   PO2ART 133 (H) 07/02/2023 0856   HCO3 30.6 (H) 04/24/2024 1122   TCO2 32 04/24/2024 1122   ACIDBASEDEF 8.6 (H) 07/02/2023 0856   O2SAT 98 04/24/2024 1122       __________________________________________________________ Recent Labs  Lab 05/09/24 1604  NA 141  K 4.6  CO2 27  GLUCOSE 226*  BUN 33*  CREATININE 3.04*  CALCIUM 8.4*    Cr  * stable,  Up from baseline see below Lab Results  Component Value Date   CREATININE 3.04 (H) 05/09/2024   CREATININE 3.64 (H) 04/27/2024   CREATININE 3.15 (H) 04/25/2024    No results for input(s): AST, ALT, ALKPHOS, BILITOT, PROT, ALBUMIN  in the last 168 hours. Lab Results  Component Value Date   CALCIUM 8.4 (L) 05/09/2024   PHOS 4.7 (H) 04/27/2024          Plt: Lab Results  Component Value Date   PLT 313  05/09/2024         Recent Labs  Lab 05/09/24 1604  WBC 7.8  HGB 8.1*  HCT 26.1*  MCV 97.0  PLT 313    HG/HCT * stable,  Down *Up from baseline see below    Component Value Date/Time   HGB 8.1 (L) 05/09/2024 1604   HCT 26.1 (L) 05/09/2024 1604   MCV 97.0 05/09/2024 1604      No results for input(s): LIPASE, AMYLASE in the last 168 hours. No results for input(s): AMMONIA in the last 168 hours.    _______________________________________________ Hospitalist was called for admission for *** There are no diagnoses linked to this encounter.   The following Work up has been ordered so far:  Orders Placed This Encounter  Procedures   Culture, blood (routine x 2)   DG Chest 2 View   CT Chest Wo Contrast   Basic metabolic panel   CBC   Document Height and Actual Weight   If O2 Sat <94% administer O2 at 2 liters/minute via nasal cannula   Consult to hospitalist   Bipap   I-Stat Lactic Acid   ED EKG     OTHER Significant initial  Findings:  labs showing:     DM  labs:   HbA1C: Recent Labs    06/29/23 1019 12/02/23 0329  HGBA1C 9.9* 11.2*       CBG (last 3)  No results for input(s): GLUCAP in the last 72 hours.        Cultures:    Component Value Date/Time   SDES BLOOD LEFT ANTECUBITAL 06/17/2014 1205   SPECREQUEST BOTTLES DRAWN AEROBIC AND ANAEROBIC 5CCS 06/17/2014 1205   CULT  06/17/2014 1205    NO GROWTH 5 DAYS Performed at [facility]    REPTSTATUS 06/23/2014 FINAL 06/17/2014 1205     Radiological Exams on Admission: CT Chest Wo Contrast Result Date: 05/09/2024 EXAM: CT CHEST WITHOUT CONTRAST 05/09/2024 09:17:32 PM TECHNIQUE: CT of the chest was performed without the administration of intravenous contrast. Multiplanar reformatted images are provided for review. Automated exposure control, iterative reconstruction, and/or weight based adjustment of the mA/kV was utilized to reduce the radiation dose to as low as reasonably achievable. COMPARISON: Chest radiograph 04/25/2024, chest radiograph 05/09/2024, and CT chest 04/13/2024. CLINICAL HISTORY: SOB. Shortness of breath. Cough. FINDINGS: MEDIASTINUM: Heart: Normal heart size. Trace pericardial effusion. Calcification of the coronary arteries. Aorta: Calcification of the aorta. Central airways: The central airways are clear. Thyroid  gland: Thyroid  gland is unremarkable. Esophagus: Esophagus is decompressed. LYMPH NODES: Large mediastinal lymph nodes with pretracheal lymph nodes measuring up to about 16 mm diameter. These are likely reactive. No hilar or axillary lymphadenopathy. LUNGS AND PLEURA: Severe diffuse emphysematous changes throughout the lungs with scattered fibrosis. Bronchiectasis and bronchial wall thickening. Scarring in the lung apices, greater on the right, unchanged since prior study. New focal areas of irregular consolidation with cavitation and surrounding infiltration demonstrated in both lungs. The largest lesion is in the right middle lung, measuring 5.5 cm diameter.  There are some more smaller lesions in the superior segment of the left lower lobe, measuring 1.3 x 2.8 cm diameter. Left apical lesion measures 1.5 cm diameter. Given the short time frame for development of these lesions since the prior CT of 04/13/2024, this is most likely infectious or inflammatory. Consider necrotizing pneumonia, septic emboli, or atypical infection such as mycobacterial or fungal infection. Follow-up to resolution is recommended to exclude neoplastic disease. No pleural effusion or pneumothorax. SOFT TISSUES/BONES:  Degenerative changes in the spine. No acute bony abnormalities. No acute abnormality of the soft tissues. UPPER ABDOMEN: Limited images of the upper abdomen demonstrate diffuse calcification in the pancreas, likely due to chronic pancreatitis. Otherwise, no acute abnormality. IMPRESSION: 1. New focal areas of irregular consolidation with cavitation and surrounding infiltration in both lungs, largest in the right middle lung (5.5 cm), likely infectious or inflammatory given the short interval since prior CT, with differential including necrotizing pneumonia, septic emboli, or atypical infection such as mycobacterial or fungal infection, and follow-up to resolution is recommended to exclude neoplastic disease. 2. Severe diffuse emphysematous changes with scattered fibrosis, bronchiectasis, and bronchial wall thickening. 3. Enlarged mediastinal lymph nodes, likely reactive. Electronically signed by: Elsie Gravely MD 05/09/2024 09:27 PM EST RP Workstation: [id number]   DG Chest 2 View Result Date: 05/09/2024 EXAM: 2 VIEW(S) XRAY OF THE CHEST 05/09/2024 04:06:00 PM COMPARISON: 04/25/2024 CLINICAL HISTORY: FINDINGS: LUNGS AND PLEURA: Right apical scarring. New round cavitary 5.3 cm opacity in right mid lung. Given the short time of occurrence: This is most likely infectious or inflammatory, such as necrotizing pneumonia. Also consider atypical infections. Follow up after resolution of  acute process is recommended to exclude persistent parenchymal mass. Small left pleural effusion with basilar atelectasis. Emphysematous changes and scattered fibrosis in the lungs. Right apical scarring and calcification. Similar linear opacities in left mid lung. No pneumothorax. HEART AND MEDIASTINUM: Aortic arch calcifications. No acute abnormality of the cardiac and mediastinal silhouettes. BONES AND SOFT TISSUES: No acute osseous abnormality. IMPRESSION: 1. New round cavitary 5.3 cm opacity in the right mid lung, most likely infectious or inflammatory such as necrotizing pneumonia, with atypical infection also in the differential; follow-up after resolution of the acute process is recommended to exclude a persistent parenchymal mass. 2. Small left pleural effusion with basilar atelectasis. Electronically signed by: Elsie Gravely MD 05/09/2024 04:41 PM EST RP Workstation: [id number]   _______________________________________________________________________________________________________ Latest  Blood pressure (!) 155/99, pulse (!) 56, temperature 98 F (36.7 C), resp. rate 20, height 5' 9 (1.753 m), weight 66 kg, SpO2 100%.   Vitals  labs and radiology finding personally reviewed  Review of Systems:    Pertinent positives include: ***  Constitutional:  No weight loss, night sweats, Fevers, chills, fatigue, weight loss  HEENT:  No headaches, Difficulty swallowing,Tooth/dental problems,Sore throat,  No sneezing, itching, ear ache, nasal congestion, post nasal drip,  Cardio-vascular:  No chest pain, Orthopnea, PND, anasarca, dizziness, palpitations.no Bilateral lower extremity swelling  GI:  No heartburn, indigestion, abdominal pain, nausea, vomiting, diarrhea, change in bowel habits, loss of appetite, melena, blood in stool, hematemesis Resp:  no shortness of breath at rest. No dyspnea on exertion, No excess mucus, no productive cough, No non-productive cough, No coughing up of blood.No  change in color of mucus.No wheezing. Skin:  no rash or lesions. No jaundice GU:  no dysuria, change in color of urine, no urgency or frequency. No straining to urinate.  No flank pain.  Musculoskeletal:  No joint pain or no joint swelling. No decreased range of motion. No back pain.  Psych:  No change in mood or affect. No depression or anxiety. No memory loss.  Neuro: no localizing neurological complaints, no tingling, no weakness, no double vision, no gait abnormality, no slurred speech, no confusion  All systems reviewed and apart from HOPI all are negative _______________________________________________________________________________________________ Past Medical History:   Past Medical History:  Diagnosis Date   Alcoholism in recovery [facility])    Aortic atherosclerosis  Back pain 06/17/2014   Chronic lung disease    CKD (chronic kidney disease)    COPD (chronic obstructive pulmonary disease) (HCC) 07/23/2014   Essential hypertension    Gout    History of lung cancer    History of pancreatitis    Iron deficiency anemia    Lumbar disc disease    Mixed hyperlipidemia    Notalgia    Prostate hypertrophy    Pulmonary nodules 07/23/2014   Swelling    Tobacco use disorder 07/23/2014   Tumor of lung 01/26/2021   Type 2 diabetes mellitus without complication    Uncontrolled type 2 diabetes mellitus with hyperglycemia, without long-term current use of insulin  [facility])       Past Surgical History:  Procedure Laterality Date   BACK SURGERY      Social History:  Ambulatory *** independently cane, walker  wheelchair bound, bed bound     reports that he has been smoking cigarettes. He started smoking about 62 years ago. He has a 90 pack-year smoking history. He uses smokeless tobacco. He reports that he does not currently use alcohol after a past usage of about 3.0 standard drinks of alcohol per week. He reports that he does not use drugs.     Family  History: *** Family History  Problem Relation Age of Onset   Lung cancer Mother    Lung cancer Father    ______________________________________________________________________________________________ Allergies: Allergies[1]   Prior to Admission medications  Medication Sig Start Date End Date Taking? Authorizing Provider  albuterol  (VENTOLIN  HFA) 108 (90 Base) MCG/ACT inhaler Inhale 1-2 puffs into the lungs every 6 (six) hours as needed for wheezing or shortness of breath. 12/02/23   Perri DELENA Meliton Randall., MD  allopurinol (ZYLOPRIM) 100 MG tablet Take 100 mg by mouth every morning. 02/25/22   [provider]  amLODipine  (NORVASC ) 10 MG tablet Take 1 tablet (10 mg total) by mouth daily. 04/28/24 05/28/24  Leotis Bogus, MD  apixaban  (ELIQUIS ) 5 MG TABS tablet Take 1 tablet (5 mg total) by mouth 2 (two) times daily. Start this once the eliquis  starter pack is complete. 01/02/24   Perri DELENA Meliton Randall., MD  carvedilol  (COREG ) 12.5 MG tablet Take 1 tablet (12.5 mg total) by mouth 2 (two) times daily with a meal. 04/27/24 05/27/24  Leotis Bogus, MD  Cyanocobalamin (VITAMIN B-12 CR) 1500 MCG TBCR Take 1,500 mcg by mouth daily.    [provider]  dapagliflozin propanediol (FARXIGA) 10 MG TABS tablet Take 10 mg by mouth daily. 09/03/21   [provider]  DM-Doxylamine-Acetaminophen  (NYQUIL COLD & FLU PO) Take 1 Dose by mouth every 6 (six) hours as needed (cold/cough).    [provider]  finasteride  (PROSCAR ) 5 MG tablet Take 5 mg by mouth daily. 02/25/22   [provider]  Fluticasone -Umeclidin-Vilant (TRELEGY ELLIPTA ) 200-62.5-25 MCG/ACT AEPB Inhale 1 puff into the lungs daily. 05/03/24 05/03/25  Mannam, Praveen, MD  guaifenesin  (ROBITUSSIN) 100 MG/5ML syrup Take 200 mg by mouth 3 (three) times daily as needed for cough.    [provider]  insulin  degludec (TRESIBA ) 100 UNIT/ML FlexTouch Pen Inject 25 Units into the skin daily. 12/02/23    Perri DELENA Meliton Randall., MD  ipratropium-albuterol  (DUONEB) 0.5-2.5 (3) MG/3ML SOLN Take 3 mLs by nebulization every 4 (four) hours as needed (Wheezing, SOB). 04/13/24   Ula Prentice SAUNDERS, MD  Multiple Vitamins-Minerals (MULTIVITAMIN WITH MINERALS) tablet Take 1 tablet by mouth daily.    [provider]  Omega-3  Fatty Acids (FISH OIL) 1200 MG CAPS Take 1,200 mg by mouth daily.    [provider]  Phenyleph-Doxylamine-DM-APAP (ALKA-SELTZER PLS ALLERGY & CGH PO) Take 1 Dose by mouth every 4 (four) hours as needed (cold/cough).    [provider]  predniSONE  (DELTASONE ) 10 MG tablet Take 6 tablets (60 mg total) by mouth daily with breakfast. Take 60 mg/day on day 1 and reduce dose by 10 mg every 2 days until taper is complete 05/03/24   Mannam, Praveen, MD  Pseudoephedrine-APAP-DM (DAYQUIL MULTI-SYMPTOM COLD/FLU PO) Take 1 Dose by mouth every 6 (six) hours as needed (cold/cough).    [provider]  Pseudoephedrine-Ibuprofen (ADVIL COLD/SINUS PO) Take 1 Dose by mouth every 6 (six) hours as needed (cold/sinus).    [provider]  SAMBUCOL BLACK ELDERBERRY PO Take 1 Dose by mouth daily as needed (immune support).    [provider]  simvastatin  (ZOCOR ) 10 MG tablet Take 10 mg by mouth at bedtime. 02/25/22   [provider]  tamsulosin  (FLOMAX ) 0.4 MG CAPS capsule Take 0.8 mg by mouth at bedtime. 02/25/22   [provider]    ___________________________________________________________________________________________________ Physical Exam:    05/09/2024    9:46 PM 05/09/2024    9:00 PM 05/09/2024    8:00 PM  Vitals with BMI  Systolic 155    Diastolic 99    Pulse 56 58 62     1. General:  in No ***Acute distress***increased work of breathing ***complaining of severe pain****agitated * Chronically ill *well *cachectic *toxic acutely ill -appearing 2. Psychological: Alert and *** Oriented 3. Head/ENT:   Moist *** Dry Mucous  Membranes                          Head Non traumatic, neck supple                          Normal *** Poor Dentition 4. SKIN: normal *** decreased Skin turgor,  Skin clean Dry and intact no rash    5. Heart: Regular rate and rhythm no*** Murmur, no Rub or gallop 6. Lungs: ***Clear to auscultation bilaterally, no wheezes or crackles   7. Abdomen: Soft, ***non-tender, Non distended *** obese ***bowel sounds present 8. Lower extremities: no clubbing, cyanosis, no ***edema 9. Neurologically Grossly intact, moving all 4 extremities equally *** strength 5 out of 5 in all 4 extremities cranial nerves II through XII intact 10. MSK: Normal range of motion    Chart has been reviewed  ______________________________________________________________________________________________  Assessment/Plan  ***  Admitted for *** There are no diagnoses linked to this encounter.   Present on Admission: **None**     No problem-specific Assessment & Plan notes found for this encounter.    Other plan as per orders.  DVT prophylaxis:  SCD *** Lovenox       Code Status:    Code Status: Prior FULL CODE *** DNR/DNI ***comfort care as per patient ***family  I had personally discussed CODE STATUS with patient and family*  ACP *** none has been reviewed ***   Family Communication:   Family not at  Bedside  plan of care was discussed on the phone with *** Son, Daughter, Wife, Husband, Sister, Brother , father, mother  Diet    Disposition Plan:   *** likely will need placement for rehabilitation  Back to current facility when stable                            To home once workup is complete and patient is stable  ***Following barriers for discharge:                             Chest pain *** Stroke *** Syncope ***work up is complete                            Electrolytes corrected                               Anemia corrected h/H stable                             Pain  controlled with PO medications                               Afebrile, white count improving able to transition to PO antibiotics                             Will need to be able to tolerate PO                            Will likely need home health, home O2, set up                           Will need consultants to evaluate patient prior to discharge                           Work of breathing improves       Consult Orders  (From admission, onward)           Start     Ordered   05/09/24 1834  Consult to hospitalist  PG SENT  BY DELORIS 18:47  Once       Provider:  (Not yet assigned)  Question Answer Comment  Place call to: [facility]   Reason for Consult Admit      05/09/24 1833                              ***Would benefit from PT/OT eval prior to DC  Ordered                   Swallow eval - SLP ordered                   Diabetes care coordinator                   Transition of care consulted                   Nutrition    consulted                  Wound care  consulted  Palliative care    consulted                   Behavioral health  consulted                    Consults called: ***  NONE   Admission status:  ED Disposition     None        Obs***  ***  inpatient     I Expect 2 midnight stay secondary to severity of patient's current illness need for inpatient interventions justified by the following: ***hemodynamic instability despite optimal treatment (tachycardia *hypotension * tachypnea *hypoxia, hypercapnia) *** Severe lab/radiological/exam abnormalities including:    There are no diagnoses linked to this encounter. and extensive comorbidities including: *substance abuse  *Chronic pain *DM2  * CHF * CAD  * COPD/asthma *Morbid Obesity * CKD *dementia *liver disease *history of stroke with residual deficits *  malignancy, * sickle cell disease  History of amputation Chronic anticoagulation  That are currently  affecting medical management.   I expect  patient to be hospitalized for 2 midnights requiring inpatient medical care.  Patient is at high risk for adverse outcome (such as loss of life or disability) if not treated.  Indication for inpatient stay as follows:  Severe change from baseline regarding mental status Hemodynamic instability despite maximal medical therapy,  severe pain requiring acute inpatient management,  inability to maintain oral hydration   persistent chest pain despite medical management Need for operative/procedural  intervention New or worsening hypoxia ongoing suicidal ideations   Need for IV antibiotics, IV fluids,, IV pain medications, IV anticoagulation,  IV rate controling medications, IV antihypertensives need for biPAP Frequent labs    Level of care   *** tele  For 12H 24H     medical floor       progressive     stepdown   tele indefinitely please discontinue once patient no longer qualifies COVID-19 Labs    Critical***  Patient is critically ill due to  hemodynamic instability * respiratory failure *severe sepsis* ongoing chest pain*  They are at high risk for life/limb threatening clinical deterioration requiring frequent reassessment and modifications of care.  Services provided include examination of the patient, review of relevant ancillary tests, prescription of lifesaving therapies, review of medications and prophylactic therapy.  Total critical care time excluding separately billable procedures: 60*  Minutes.    Jeffrey Randall 05/09/2024, 10:33 PM ***  [facility]     after 2 AM please page floor coverage   If 7AM-7PM, please contact the day team taking care of the patient using [email or web address]          [1] Allergies Allergen Reactions   Chantix  Continuing Month [Varenicline ] Other (See Comments)    Doesn't work for patient   Januvia [Sitagliptin] Other (See Comments)    Class Contraindicated   Lisinopril Swelling     Possible tongue swelling. ARB's okay   Motrin [Ibuprofen] Other (See Comments)    Contraindicated by Renal disease   Remeron [Mirtazapine] Other (See Comments)    Excess somnolence on the 15 mg dose   Penicillins Other (See Comments)    Facial swelling  "

## 2024-05-09 NOTE — Progress Notes (Addendum)
 Pharmacy Antibiotic Note  Jeffrey Randall. is a 84 y.o. male admitted on 05/09/2024 with pneumonia with cavitary lesion.  Pharmacy has been consulted for vancomycin /cefepime /flagyl  dosing.  Plan: Vancomycin  1500mg  IV x1 Vancomycin  1000mg  IV q48h (eAUC 542, Vd 0.72, Scr 3.04) Cefepime  2g IV q24h Flagyl  500mg  IV q12h Follow up renal function for adjustments as needed  Follow up ability to de-escalate  Height: 5' 9 (175.3 cm) Weight: 66 kg (145 lb 8.1 oz) IBW/kg (Calculated) : 70.7  Temp (24hrs), Avg:97.8 F (36.6 C), Min:97.6 F (36.4 C), Max:98 F (36.7 C)  Recent Labs  Lab 05/09/24 1604 05/09/24 2219  WBC 7.8  --   CREATININE 3.04*  --   LATICACIDVEN  --  0.9    Estimated Creatinine Clearance: 16.9 mL/min (A) (by C-G formula based on SCr of 3.04 mg/dL (H)).    Allergies[1]   Microbiology results: 1/28 BCx: pending 1/28 Sputum: pending  1/28 MRSA PCR: pending  Thank you for allowing pharmacy to be a part of this patients care.  Elma Fail, PharmD PGY1 Clinical Pharmacist [facility]  05/09/2024 11:05 PM      [1]  Allergies Allergen Reactions   Chantix  Continuing Month [Varenicline ] Other (See Comments)    Doesn't work for patient   Januvia [Sitagliptin] Other (See Comments)    Class Contraindicated   Lisinopril Swelling    Possible tongue swelling. ARB's okay   Motrin [Ibuprofen] Other (See Comments)    Contraindicated by Renal disease   Remeron [Mirtazapine] Other (See Comments)    Excess somnolence on the 15 mg dose   Penicillins Other (See Comments)    Facial swelling

## 2024-05-09 NOTE — Subjective & Objective (Signed)
 Patient presented with coughing and shortness of breath patient has known history of COPD foot which he wears 2 L of oxygen .  Symptoms has progressed and he is now having trouble ambulating no associated fevers no abdominal pain no chest pain no fever nausea or vomiting he is on Eliquis  in emergency department patient appears to be tachypneic and wheezing with slightly elevated troponin 150s but stable he was given DuoNeb Imaging which showed cavitary lesion on the right middle lung suspicious for necrotizing pneumonia versus septic emboli Started on Rocephin  azithromycin 

## 2024-05-10 ENCOUNTER — Inpatient Hospital Stay (HOSPITAL_COMMUNITY)

## 2024-05-10 DIAGNOSIS — I10 Essential (primary) hypertension: Secondary | ICD-10-CM | POA: Diagnosis not present

## 2024-05-10 DIAGNOSIS — J984 Other disorders of lung: Secondary | ICD-10-CM | POA: Diagnosis not present

## 2024-05-10 DIAGNOSIS — I2782 Chronic pulmonary embolism: Secondary | ICD-10-CM | POA: Diagnosis not present

## 2024-05-10 DIAGNOSIS — J850 Gangrene and necrosis of lung: Secondary | ICD-10-CM | POA: Diagnosis not present

## 2024-05-10 DIAGNOSIS — R0602 Shortness of breath: Secondary | ICD-10-CM | POA: Diagnosis not present

## 2024-05-10 DIAGNOSIS — Z794 Long term (current) use of insulin: Secondary | ICD-10-CM | POA: Diagnosis not present

## 2024-05-10 DIAGNOSIS — R7989 Other specified abnormal findings of blood chemistry: Secondary | ICD-10-CM

## 2024-05-10 DIAGNOSIS — R634 Abnormal weight loss: Secondary | ICD-10-CM | POA: Diagnosis not present

## 2024-05-10 DIAGNOSIS — N1832 Chronic kidney disease, stage 3b: Secondary | ICD-10-CM | POA: Diagnosis not present

## 2024-05-10 DIAGNOSIS — J449 Chronic obstructive pulmonary disease, unspecified: Secondary | ICD-10-CM

## 2024-05-10 DIAGNOSIS — Z85118 Personal history of other malignant neoplasm of bronchus and lung: Secondary | ICD-10-CM | POA: Diagnosis not present

## 2024-05-10 DIAGNOSIS — J189 Pneumonia, unspecified organism: Secondary | ICD-10-CM | POA: Diagnosis not present

## 2024-05-10 DIAGNOSIS — J9601 Acute respiratory failure with hypoxia: Secondary | ICD-10-CM | POA: Diagnosis not present

## 2024-05-10 DIAGNOSIS — E119 Type 2 diabetes mellitus without complications: Secondary | ICD-10-CM | POA: Diagnosis not present

## 2024-05-10 DIAGNOSIS — J441 Chronic obstructive pulmonary disease with (acute) exacerbation: Secondary | ICD-10-CM | POA: Diagnosis not present

## 2024-05-10 LAB — RESPIRATORY PANEL BY PCR

## 2024-05-10 LAB — ECHOCARDIOGRAM COMPLETE
Area-P 1/2: 2.41 cm2
Height: 69 in
S' Lateral: 3.2 cm
Weight: 2328.06 [oz_av]

## 2024-05-10 LAB — RETICULOCYTES
Immature Retic Fract: 6.6 % (ref 2.3–15.9)
RBC.: 2.58 MIL/uL — ABNORMAL LOW (ref 4.22–5.81)
Retic Count, Absolute: 20.4 10*3/uL (ref 19.0–186.0)
Retic Ct Pct: 0.8 % (ref 0.4–3.1)

## 2024-05-10 LAB — CBC
HCT: 25.9 % — ABNORMAL LOW (ref 39.0–52.0)
Hemoglobin: 7.9 g/dL — ABNORMAL LOW (ref 13.0–17.0)
MCH: 30.2 pg (ref 26.0–34.0)
MCHC: 30.5 g/dL (ref 30.0–36.0)
MCV: 98.9 fL (ref 80.0–100.0)
Platelets: 277 10*3/uL (ref 150–400)
RBC: 2.62 MIL/uL — ABNORMAL LOW (ref 4.22–5.81)
RDW: 14.2 % (ref 11.5–15.5)
WBC: 5.8 10*3/uL (ref 4.0–10.5)
nRBC: 0 % (ref 0.0–0.2)

## 2024-05-10 LAB — COMPREHENSIVE METABOLIC PANEL WITH GFR
ALT: 14 U/L (ref 0–44)
AST: 14 U/L — ABNORMAL LOW (ref 15–41)
Albumin: 2.4 g/dL — ABNORMAL LOW (ref 3.5–5.0)
Alkaline Phosphatase: 116 U/L (ref 38–126)
Anion gap: 7 (ref 5–15)
BUN: 33 mg/dL — ABNORMAL HIGH (ref 8–23)
CO2: 24 mmol/L (ref 22–32)
Calcium: 8.6 mg/dL — ABNORMAL LOW (ref 8.9–10.3)
Chloride: 109 mmol/L (ref 98–111)
Creatinine, Ser: 2.95 mg/dL — ABNORMAL HIGH (ref 0.61–1.24)
GFR, Estimated: 20 mL/min — ABNORMAL LOW
Glucose, Bld: 146 mg/dL — ABNORMAL HIGH (ref 70–99)
Potassium: 4.6 mmol/L (ref 3.5–5.1)
Sodium: 141 mmol/L (ref 135–145)
Total Bilirubin: <0.2 mg/dL (ref 0.0–1.2)
Total Protein: 5.9 g/dL — ABNORMAL LOW (ref 6.5–8.1)

## 2024-05-10 LAB — MAGNESIUM
Magnesium: 2.2 mg/dL (ref 1.7–2.4)
Magnesium: 2.3 mg/dL (ref 1.7–2.4)

## 2024-05-10 LAB — BLOOD GAS, VENOUS
Acid-base deficit: 0.1 mmol/L (ref 0.0–2.0)
Bicarbonate: 26.8 mmol/L (ref 20.0–28.0)
O2 Saturation: 38.6 %
Patient temperature: 36.7
pCO2, Ven: 51 mmHg (ref 44–60)
pH, Ven: 7.32 (ref 7.25–7.43)
pO2, Ven: <31 mmHg — CL (ref 32–45)

## 2024-05-10 LAB — PHOSPHORUS
Phosphorus: 3 mg/dL (ref 2.5–4.6)
Phosphorus: 3.1 mg/dL (ref 2.5–4.6)

## 2024-05-10 LAB — IRON AND TIBC
Iron: 19 ug/dL — ABNORMAL LOW (ref 45–182)
Saturation Ratios: 12 % — ABNORMAL LOW (ref 17.9–39.5)
TIBC: 162 ug/dL — ABNORMAL LOW (ref 250–450)
UIBC: 144 ug/dL

## 2024-05-10 LAB — PROCALCITONIN: Procalcitonin: 0.2 ng/mL

## 2024-05-10 LAB — FOLATE: Folate: 18.1 ng/mL

## 2024-05-10 LAB — CK: Total CK: 65 U/L (ref 49–397)

## 2024-05-10 LAB — CBG MONITORING, ED
Glucose-Capillary: 138 mg/dL — ABNORMAL HIGH (ref 70–99)
Glucose-Capillary: 229 mg/dL — ABNORMAL HIGH (ref 70–99)
Glucose-Capillary: 273 mg/dL — ABNORMAL HIGH (ref 70–99)

## 2024-05-10 LAB — STREP PNEUMONIAE URINARY ANTIGEN: Strep Pneumo Urinary Antigen: NEGATIVE

## 2024-05-10 LAB — MRSA NEXT GEN BY PCR, NASAL: MRSA by PCR Next Gen: NOT DETECTED

## 2024-05-10 LAB — FERRITIN: Ferritin: 477 ng/mL — ABNORMAL HIGH (ref 24–336)

## 2024-05-10 LAB — I-STAT CG4 LACTIC ACID, ED: Lactic Acid, Venous: 0.7 mmol/L (ref 0.5–1.9)

## 2024-05-10 LAB — GLUCOSE, CAPILLARY
Glucose-Capillary: 490 mg/dL — ABNORMAL HIGH (ref 70–99)
Glucose-Capillary: 407 mg/dL — ABNORMAL HIGH (ref 70–99)

## 2024-05-10 LAB — VITAMIN B12: Vitamin B-12: 3994 pg/mL — ABNORMAL HIGH (ref 180–914)

## 2024-05-10 MED ORDER — INSULIN ASPART 100 UNIT/ML IJ SOLN
10.0000 [IU] | INTRAMUSCULAR | Status: AC
  Administered 2024-05-10: 10 [IU] via SUBCUTANEOUS
  Filled 2024-05-10: qty 10

## 2024-05-10 NOTE — Progress Notes (Signed)
 [facility]  PROGRESS NOTE  Jeffrey Randall. FMW:[id number] DOB: 21-Feb-1941 DOA: 05/09/2024 PCP: Sheldon Netter, PA   Brief HPI:    84 y.o. male with medical history significant of   COPD, CKD 3b, HTN, DM2, hx. lung cancer s/p radiation, gout, chronic normocytic anemia, history of pulmonary embolism in 2024 on Eliquis   Presented with worsening shortness of breath cough Patient presented with coughing and shortness of breath patient has known history of COPD foot which he wears 2 L of oxygen .  Symptoms has progressed and he is now having trouble ambulating no associated fevers no abdominal pain no chest pain no fever nausea or vomiting he is on Eliquis  in emergency department patient appears to be tachypneic and wheezing with slightly elevated troponin 150s but stable he was given DuoNeb Imaging which showed cavitary lesion on the right middle lung suspicious for necrotizing pneumonia versus septic emboli Started on Rocephin  azithromycin       Assessment/Plan:     Acute respiratory failure with hypoxia (HCC) Chronically on 2 L Continue to titrate O2 as needed Increased oxygen  requirement in the setting of necrotizing pneumonia with cavitary lesion   BPH (benign prostatic hyperplasia) Continue Flomax    Cavitary pneumonia -Started on vancomycin , Flagyl  and cefepime  -MRSA negative, vancomycin  discontinued -ID consulted, started on airborne precautions Check for Legionella and strep pneumo -CT scan shows worsening of right upper lobe consolidation with cavitation and surrounding infiltration both lungs, largest in the right middle lung likely infectious or inflammatory;?  Necrotizing pneumonia, septic emboli - Will consult pulmonology   COPD (chronic obstructive pulmonary disease) (HCC) Continue home medications - Recently completed prednisone  taper   Essential hypertension Continue Norvasc  10 mg a day  Coreg  12.5 mg twice daily   History of BPH Continue home meds    Insulin  dependent type 2 diabetes mellitus (HCC) Order sliding scale  decrease Tresiba  down to 10 units   Pulmonary embolism (HCC) continue Eliquis  2 times daily    DVT prophylaxis: Apixaban   Medications     amLODipine   10 mg Oral Daily   apixaban   5 mg Oral BID   carvedilol   12.5 mg Oral BID WC   finasteride   5 mg Oral Daily   guaiFENesin   600 mg Oral BID   insulin  aspart  0-6 Units Subcutaneous Q4H   insulin  glargine  10 Units Subcutaneous Q2200   simvastatin   10 mg Oral QHS   tamsulosin   0.8 mg Oral QHS     Data Reviewed:   CBG:  Recent Labs  Lab 05/09/24 2333 05/10/24 0342  GLUCAP 141* 138*    SpO2: 100 % O2 Flow Rate (L/min): 3 L/min    Vitals:   05/10/24 0214 05/10/24 0232 05/10/24 0400 05/10/24 0512  BP:  (!) 132/49 (!) 135/48 (!) 137/54  Pulse:  (!) 51 (!) 58 60  Resp:  18 17 16   Temp: 97.9 F (36.6 C)   97.6 F (36.4 C)  TempSrc: Oral   Oral  SpO2:  100% 100% 100%  Weight:      Height:          Data Reviewed:  Basic Metabolic Panel: Recent Labs  Lab 05/09/24 1604 05/09/24 2208 05/10/24 0200  NA 141  --  141  K 4.6  --  4.6  CL 108  --  109  CO2 27  --  24  GLUCOSE 226*  --  146*  BUN 33*  --  33*  CREATININE 3.04*  --  2.95*  CALCIUM  8.4*  --  8.6*  MG  --  2.2 2.3  PHOS  --  3.0 3.1    CBC: Recent Labs  Lab 05/09/24 1604 05/10/24 0200  WBC 7.8 5.8  HGB 8.1* 7.9*  HCT 26.1* 25.9*  MCV 97.0 98.9  PLT 313 277    LFT Recent Labs  Lab 05/10/24 0200  AST 14*  ALT 14  ALKPHOS 116  BILITOT <0.2  PROT 5.9*  ALBUMIN  2.4*     Antibiotics: Anti-infectives (From admission, onward)    Start     Dose/Rate Route Frequency Ordered Stop   05/11/24 2315  vancomycin  (VANCOCIN ) IVPB 1000 mg/200 mL premix        1,000 mg 200 mL/hr over 60 Minutes Intravenous Every 48 hours 05/09/24 2303     05/09/24 2330  ceFEPIme  (MAXIPIME ) 2 g in sodium chloride  0.9 % 100 mL IVPB        2 g 200 mL/hr over 30 Minutes Intravenous Every  24 hours 05/09/24 2324     05/09/24 2330  metroNIDAZOLE  (FLAGYL ) IVPB 500 mg        500 mg 100 mL/hr over 60 Minutes Intravenous Every 12 hours 05/09/24 2325     05/09/24 2315  vancomycin  (VANCOREADY) IVPB 1500 mg/300 mL        1,500 mg 150 mL/hr over 120 Minutes Intravenous  Once 05/09/24 2303 05/10/24 0340   05/09/24 1845  cefTRIAXone  (ROCEPHIN ) 1 g in sodium chloride  0.9 % 100 mL IVPB        1 g 200 mL/hr over 30 Minutes Intravenous  Once 05/09/24 1832 05/09/24 1930   05/09/24 1845  azithromycin  (ZITHROMAX ) tablet 500 mg        500 mg Oral  Once 05/09/24 1832 05/09/24 1851        CONSULTS infectious disease  Code Status: Full code  Family Communication: No family at bedside     Subjective   Patient seen and examined, denies chest pain or shortness of breath.   Objective    Physical Examination:   General-appears in no acute distress Heart-S1-S2, regular, no murmur auscultated Lungs-decreased breath sounds right upper lung field Abdomen-soft, nontender, no organomegaly Extremities-no edema in the lower extremities Neuro-alert, oriented x3, no focal deficit noted            Heath Badon S Pearle Wandler   [facility] If 7PM-7AM, please contact night-coverage at [email or web address], Office  [phone]   05/10/2024, 8:12 AM  LOS: 1 day

## 2024-05-10 NOTE — Plan of Care (Signed)
 RN reported that patient blood glucose is 490.  Giving NovoLog  10 units and recommended to check the blood glucose level in 2 hours and inform for further management.   Nathanuel Cabreja, MD [facility] 05/10/2024, 8:04 PM

## 2024-05-10 NOTE — Plan of Care (Signed)
  Problem: Coping: Goal: Ability to adjust to condition or change in health will improve Outcome: Progressing   Problem: Education: Goal: Knowledge of General Education information will improve Description: Including pain rating scale, medication(s)/side effects and non-pharmacologic comfort measures Outcome: Progressing   

## 2024-05-10 NOTE — Progress Notes (Signed)
 1844: Pt arrived to unit; settled, cardiac monitor applied, vital signs measured, call bell and personal items within reach.

## 2024-05-10 NOTE — Consult Note (Signed)
 "                                                                  [facility]                                                                                        Patient Identification: Patient Name: Jeffrey Randall. MRN: [id number] Admit Date: 05/09/2024  2:48 PM Today's Date: 05/10/2024 Reason for consult: Cavitary lung disease  Requesting provider: Dr Drusilla   Principal Problem:   Cavitary pneumonia Active Problems:   Essential hypertension   Insulin  dependent type 2 diabetes mellitus (HCC)   COPD (chronic obstructive pulmonary disease) (HCC)   Acute respiratory failure with hypoxia (HCC)   BPH (benign prostatic hyperplasia)   History of BPH   Pulmonary embolism (HCC)  Antibiotics:  Azithromycin  1/28 Cefepime  1/28 Ceftriaxone  1/28 Metronidazole  1/28- Vancomycin  1/28  Lines/Hardware:  Assessment # Cavitary pneumonia in the right middle lung with other smaller lesions in superior segment of left lower lobe - Wide differentials: Cavitary pneumonia due to common bacterial pathogens like Staph aureus, Streptococcus, anaerobes, aspiration pneumonia, Tuberculosis (no high risk factors), NTM lung disease, septic emboli, lung cancer.  Fungal infections seems less likely - TTE with no vegetations  # NSCLC of right upper lung with negative EBUS biopsy treated with SBRT in 2022  # Significant weight loss  Recommendations  - DC vancomycin  as MRSA PCR negative - Follow-up blood cultures, QuantiFERON - Needs to collect respiratory cultures with Gram stain as well as AFB sputum smear/culture x 3 - Continue cefepime  and metronidazole  - HIV ordered  - Engage  pulmonary - On airborne precautions per IP D/w primary team   Rest of the management as per the primary team. Please call with questions or concerns.  Thank you for the consult  __________________________________________________________________________________________________________ HPI and  Hospital Course: 84 year old male with prior history as below including COPD on 2 L home oxygen , CKD, HTN, NSCLC of right upper lung with negative EBUS biopsy treated with SBRT in 2022, Gout, history of pancreatitis, HLD, BPH, type II DM, history of PE on AC, alcohol use in recovery, tobacco use who presented to the ED on 1/28 for worsening cough, shortness of breath.  Cough productive of white sputum.  Denies fever, chills or night sweats.  Denies chest pain, nausea, vomiting, abdominal pain or diarrhea.  Denies hemoptysis.   Reports loss of height as well as loss of 56 pounds in 1 year. 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 Denies any known family history of TB or contact with known TB, denies history of IVDU, incarceration or being homeless.  Quit smoking a month ago or so. Lives with his family  Recently admitted for influenza A, bronchitis from July 13 to 20, 2016.  Treated with a course of steroids as well as  oseltamivir .   At ED afebrile Labs remarkable for hemoglobin 8.1, troponin 156, creatinine 3.04, BG 226 RVP full panel negative Strep pneumo urinary antigen negative MRSA PCR negative  Reports loss of 56 pounds in 1 year as well as a decreased appetite.  Worked as a curator, denies fever, chills or night sweats, or hemoptysis.  Denies any known family history of TB or contact with known TB, denies history of IVDU, incarceration or being homeless.  S/p chemo and radiation for lung cancer.  Quit smoking.   ROS: General- Denies fever, chills HEENT - Denies headache, blurry vision, neck pain, sinus pain Chest - Denies any chest pain CVS- Denies any dizziness/lightheadedness, syncopal attacks, palpitations Abdomen- Denies any nausea, vomiting, abdominal pain, hematochezia and diarrhea Neuro - Denies any weakness, numbness, tingling sensation Psych - Denies any changes in mood irritability or depressive symptoms GU- Denies any burning,  dysuria, hematuria or increased frequency of urination Skin - denies any rashes/lesions MSK - denies any joint pain/swelling or restricted ROM   Past Medical History:  Diagnosis Date   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)    Aortic atherosclerosis    Back pain 06/17/2014   Chronic lung disease    CKD (chronic kidney disease)    COPD (chronic obstructive pulmonary disease) (HCC) 07/23/2014   Essential hypertension    Gout    History of lung cancer    History of pancreatitis    Iron deficiency anemia    Lumbar disc disease    Mixed hyperlipidemia    Notalgia    Prostate hypertrophy    Pulmonary nodules 07/23/2014   Swelling    Tobacco use disorder 07/23/2014   Tumor of lung 01/26/2021   Type 2 diabetes mellitus without complication    Uncontrolled type 2 diabetes mellitus with hyperglycemia, without long-term current use of insulin  (HCC)    Past Surgical History:  Procedure Laterality Date   BACK SURGERY     Scheduled Meds:  amLODipine   10 mg Oral Daily   apixaban   5 mg Oral BID   carvedilol   12.5 mg Oral BID WC   finasteride   5 mg Oral Daily   guaiFENesin   600 mg Oral BID   insulin  aspart  0-6 Units Subcutaneous Q4H   insulin  glargine  10 Units Subcutaneous Q2200   simvastatin   10 mg Oral QHS   tamsulosin   0.8 mg Oral QHS   Continuous Infusions:  sodium chloride  75 mL/hr at 05/10/24 9173   ceFEPime  (MAXIPIME ) IV Stopped (05/10/24 0001)   metronidazole  Stopped (05/10/24 0125)   [START ON 05/11/2024] vancomycin      PRN Meds:.acetaminophen  **OR** acetaminophen , albuterol , fentaNYL  (SUBLIMAZE ) injection, HYDROcodone -acetaminophen , ipratropium-albuterol , ondansetron  **OR** ondansetron  (ZOFRAN ) IV  Allergies[1]  Social History   Socioeconomic History   Marital status: Single    Spouse name: Not on file   Number of children: Not on file   Years of education: Not on file   Highest education level: Not on file  Occupational History   Not on file  Tobacco Use   Smoking  status: Some Days    Current packs/day: 0.00    Average packs/day: 1.5 packs/day for 60.0 years (90.0 ttl pk-yrs)    Types: Cigarettes    Start date: 10/1961    Last attempt to quit: 10/2021    Years since quitting: 2.5   Smokeless tobacco: Current   Tobacco comments:    Smokes 1-4 cigarettes a day. 01/05/2024    Smoking Cessation resources placed on AVS. 04/26/2024.SABRA  Quit smoking 3 weeks ago. 05/03/2024  Vaping Use   Vaping status: Never Used  Substance and Sexual Activity   Alcohol use: Not Currently    Alcohol/week: 3.0 standard drinks of alcohol    Types: 3 Standard drinks or equivalent per week   Drug use: No   Sexual activity: Not on file  Other Topics Concern   Not on file  Social History Narrative   Not on file   Social Drivers of Health   Tobacco Use: High Risk (05/09/2024)   Patient History    Smoking Tobacco Use: Some Days    Smokeless Tobacco Use: Current    Passive Exposure: Not on file  Financial Resource Strain: Not on file  Food Insecurity: No Food Insecurity (04/25/2024)   Epic    Worried About Programme Researcher, Broadcasting/film/video in the Last Year: Never true    [employer] of Food in the Last Year: Never true  Transportation Needs: No Transportation Needs (04/25/2024)   Epic    Lack of Transportation (Medical): No    Lack of Transportation (Non-Medical): No  Physical Activity: Not on file  Stress: Not on file  Social Connections: Moderately Isolated (04/25/2024)   Social Connection and Isolation Panel    Frequency of Communication with Friends and Family: Three times a week    Frequency of Social Gatherings with Friends and Family: Three times a week    Attends Religious Services: Never    Active Member of Clubs or Organizations: Yes    Attends Banker Meetings: More than 4 times per year    Marital Status: Divorced  Intimate Partner Violence: Not At Risk (04/25/2024)   Epic    Fear of Current or Ex-Partner: No    Emotionally Abused: No    Physically  Abused: No    Sexually Abused: No  Depression (PHQ2-9): Not on file  Alcohol Screen: Not on file  Housing: Low Risk (04/25/2024)   Epic    Unable to Pay for Housing in the Last Year: No    Number of Times Moved in the Last Year: 0    Homeless in the Last Year: No  Utilities: Not At Risk (04/25/2024)   Epic    Threatened with loss of utilities: No  Health Literacy: Not on file   Family History  Problem Relation Age of Onset   Lung cancer Mother    Lung cancer Father     Vitals BP (!) 142/51   Pulse (!) 53   Temp 97.6 F (36.4 C) (Oral)   Resp 18   Ht 5' 9 (1.753 m)   Wt 66 kg   SpO2 100%   BMI 21.49 kg/m    Physical Exam Lying in the bed, nontoxic-appearing Patient was having TTE done in the ED room at the time of evaluation Will reexamine  Pertinent Microbiology Results for orders placed or performed during the hospital encounter of 05/09/24  Culture, blood (routine x 2)     Status: None (Preliminary result)   Collection Time: 05/09/24 10:06 PM   Specimen: BLOOD  Result Value Ref Range Status   Specimen Description BLOOD SITE NOT SPECIFIED  Final   Special Requests   Final    BOTTLES DRAWN AEROBIC AND ANAEROBIC Blood Culture adequate volume   Culture   Final    NO GROWTH < 12 HOURS Performed at [facility], [address] [place], [place] [address]    Report Status PENDING  Incomplete  Culture, blood (  routine x 2)     Status: None (Preliminary result)   Collection Time: 05/09/24 10:06 PM   Specimen: BLOOD  Result Value Ref Range Status   Specimen Description BLOOD SITE NOT SPECIFIED  Final   Special Requests   Final    BOTTLES DRAWN AEROBIC AND ANAEROBIC Blood Culture adequate volume   Culture   Final    NO GROWTH < 12 HOURS Performed at [facility], [address] [place], [place] [address]    Report Status PENDING  Incomplete  Respiratory (~20 pathogens) panel by PCR     Status: None   Collection Time: 05/09/24 10:57 PM   Specimen:  Nasopharyngeal Swab; Respiratory  Result Value Ref Range Status   Adenovirus NOT DETECTED NOT DETECTED Final   Coronavirus 229E NOT DETECTED NOT DETECTED Final    Comment: (NOTE) The Coronavirus on the Respiratory Panel, DOES NOT test for the novel  Coronavirus (2019 nCoV)    Coronavirus HKU1 NOT DETECTED NOT DETECTED Final   Coronavirus NL63 NOT DETECTED NOT DETECTED Final   Coronavirus OC43 NOT DETECTED NOT DETECTED Final   Metapneumovirus NOT DETECTED NOT DETECTED Final   Rhinovirus / Enterovirus NOT DETECTED NOT DETECTED Final   Influenza A NOT DETECTED NOT DETECTED Final   Influenza B NOT DETECTED NOT DETECTED Final   Parainfluenza Virus 1 NOT DETECTED NOT DETECTED Final   Parainfluenza Virus 2 NOT DETECTED NOT DETECTED Final   Parainfluenza Virus 3 NOT DETECTED NOT DETECTED Final   Parainfluenza Virus 4 NOT DETECTED NOT DETECTED Final   Respiratory Syncytial Virus NOT DETECTED NOT DETECTED Final   Bordetella pertussis NOT DETECTED NOT DETECTED Final   Bordetella Parapertussis NOT DETECTED NOT DETECTED Final   Chlamydophila pneumoniae NOT DETECTED NOT DETECTED Final   Mycoplasma pneumoniae NOT DETECTED NOT DETECTED Final    Comment: Performed at [facility], [address], [place], [place] [address]  MRSA Next Gen by PCR, Nasal     Status: None   Collection Time: 05/09/24 11:34 PM   Specimen: Nasal Mucosa; Nasal Swab  Result Value Ref Range Status   MRSA by PCR Next Gen NOT DETECTED NOT DETECTED Final    Comment: (NOTE) The GeneXpert MRSA Assay (FDA approved for NASAL specimens only), is one component of a comprehensive MRSA colonization surveillance program. It is not intended to diagnose MRSA infection nor to guide or monitor treatment for MRSA infections. Test performance is not FDA approved in patients less than 13 years old. Performed at [facility], [address] [place], [place] [address]     Pertinent Lab seen by me:    Latest Ref Rng & Units  05/10/2024    2:00 AM 05/09/2024    4:04 PM 04/27/2024    9:31 AM  CBC  WBC 4.0 - 10.5 K/uL 5.8  7.8    Hemoglobin 13.0 - 17.0 g/dL 7.9  8.1  8.4   Hematocrit 39.0 - 52.0 % 25.9  26.1  25.8   Platelets 150 - 400 K/uL 277  313        Latest Ref Rng & Units 05/10/2024    2:00 AM 05/09/2024    4:04 PM 04/27/2024    3:52 AM  CMP  Glucose 70 - 99 mg/dL 853  773  884   BUN 8 - 23 mg/dL 33  33  66   Creatinine 0.61 - 1.24 mg/dL 7.04  6.95  6.35   Sodium 135 - 145 mmol/L 141  141  136   Potassium 3.5 - 5.1 mmol/L 4.6  4.6  3.7   Chloride 98 - 111 mmol/L 109  108  102   CO2 22 - 32 mmol/L 24  27  24    Calcium 8.9 - 10.3 mg/dL 8.6  8.4  8.5   Total Protein 6.5 - 8.1 g/dL 5.9     Total Bilirubin 0.0 - 1.2 mg/dL <9.7     Alkaline Phos 38 - 126 U/L 116     AST 15 - 41 U/L 14     ALT 0 - 44 U/L 14        Pertinent Imagings/Other Imagings Plain films and CT images have been personally visualized and interpreted; radiology reports have been reviewed. Decision making incorporated into the Impression / Recommendations.  CT Chest Wo Contrast Result Date: 05/09/2024 EXAM: CT CHEST WITHOUT CONTRAST 05/09/2024 09:17:32 PM TECHNIQUE: CT of the chest was performed without the administration of intravenous contrast. Multiplanar reformatted images are provided for review. Automated exposure control, iterative reconstruction, and/or weight based adjustment of the mA/kV was utilized to reduce the radiation dose to as low as reasonably achievable. COMPARISON: Chest radiograph 04/25/2024, chest radiograph 05/09/2024, and CT chest 04/13/2024. CLINICAL HISTORY: SOB. Shortness of breath. Cough. FINDINGS: MEDIASTINUM: Heart: Normal heart size. Trace pericardial effusion. Calcification of the coronary arteries. Aorta: Calcification of the aorta. Central airways: The central airways are clear. Thyroid  gland: Thyroid  gland is unremarkable. Esophagus: Esophagus is decompressed. LYMPH NODES: Large mediastinal lymph nodes  with pretracheal lymph nodes measuring up to about 16 mm diameter. These are likely reactive. No hilar or axillary lymphadenopathy. LUNGS AND PLEURA: Severe diffuse emphysematous changes throughout the lungs with scattered fibrosis. Bronchiectasis and bronchial wall thickening. Scarring in the lung apices, greater on the right, unchanged since prior study. New focal areas of irregular consolidation with cavitation and surrounding infiltration demonstrated in both lungs. The largest lesion is in the right middle lung, measuring 5.5 cm diameter. There are some more smaller lesions in the superior segment of the left lower lobe, measuring 1.3 x 2.8 cm diameter. Left apical lesion measures 1.5 cm diameter. Given the short time frame for development of these lesions since the prior CT of 04/13/2024, this is most likely infectious or inflammatory. Consider necrotizing pneumonia, septic emboli, or atypical infection such as mycobacterial or fungal infection. Follow-up to resolution is recommended to exclude neoplastic disease. No pleural effusion or pneumothorax. SOFT TISSUES/BONES: Degenerative changes in the spine. No acute bony abnormalities. No acute abnormality of the soft tissues. UPPER ABDOMEN: Limited images of the upper abdomen demonstrate diffuse calcification in the pancreas, likely due to chronic pancreatitis. Otherwise, no acute abnormality. IMPRESSION: 1. New focal areas of irregular consolidation with cavitation and surrounding infiltration in both lungs, largest in the right middle lung (5.5 cm), likely infectious or inflammatory given the short interval since prior CT, with differential including necrotizing pneumonia, septic emboli, or atypical infection such as mycobacterial or fungal infection, and follow-up to resolution is recommended to exclude neoplastic disease. 2. Severe diffuse emphysematous changes with scattered fibrosis, bronchiectasis, and bronchial wall thickening. 3. Enlarged mediastinal  lymph nodes, likely reactive. Electronically signed by: Elsie Gravely MD 05/09/2024 09:27 PM EST RP Workstation: [id number]   DG Chest 2 View Result Date: 05/09/2024 EXAM: 2 VIEW(S) XRAY OF THE CHEST 05/09/2024 04:06:00 PM COMPARISON: 04/25/2024 CLINICAL HISTORY: FINDINGS: LUNGS AND PLEURA: Right apical scarring. New round cavitary 5.3 cm opacity in right mid lung. Given the short time of occurrence: This is  most likely infectious or inflammatory, such as necrotizing pneumonia. Also consider atypical infections. Follow up after resolution of acute process is recommended to exclude persistent parenchymal mass. Small left pleural effusion with basilar atelectasis. Emphysematous changes and scattered fibrosis in the lungs. Right apical scarring and calcification. Similar linear opacities in left mid lung. No pneumothorax. HEART AND MEDIASTINUM: Aortic arch calcifications. No acute abnormality of the cardiac and mediastinal silhouettes. BONES AND SOFT TISSUES: No acute osseous abnormality. IMPRESSION: 1. New round cavitary 5.3 cm opacity in the right mid lung, most likely infectious or inflammatory such as necrotizing pneumonia, with atypical infection also in the differential; follow-up after resolution of the acute process is recommended to exclude a persistent parenchymal mass. 2. Small left pleural effusion with basilar atelectasis. Electronically signed by: Elsie Gravely MD 05/09/2024 04:41 PM EST RP Workstation: [id number]   DG CHEST PORT 1 VIEW Result Date: 04/25/2024 CLINICAL DATA:  Shortness of breath. EXAM: PORTABLE CHEST 1 VIEW COMPARISON:  04/24/2024, 04/13/2024, 11/29/2023 FINDINGS: Subtle elevation of the left hemidiaphragm with minimal streaky linear density left base compatible with scarring. Stable scarring right apex. No acute airspace process or effusion. Cardiomediastinal silhouette and remainder of the exam is unchanged. IMPRESSION: No acute cardiopulmonary disease. Electronically Signed    By: Toribio Agreste M.D.   On: 04/25/2024 16:03   DG Chest 2 View Result Date: 04/24/2024 CLINICAL DATA:  Cough, shortness of breath, fatigue and COPD. EXAM: CHEST - 2 VIEW COMPARISON:  04/13/2024 FINDINGS: The heart size and mediastinal contours are within normal limits. Suggestion of potentially increased bronchial thickening and opacity in the inferior left lower lobe which may be consistent with acute bronchitis or pneumonia. Probable bronchial thickening and mucous plugging in the right lower lobe. Stable opacity and scarring at the right lung apex. Stable advanced emphysematous lung disease. The visualized skeletal structures are unremarkable. IMPRESSION: Suggestion of increased bronchial thickening and opacity in the inferior left lower lobe which may be consistent with acute bronchitis or pneumonia. Probable bronchial thickening and mucous plugging in the right lower lobe. Electronically Signed   By: Marcey Moan M.D.   On: 04/24/2024 12:01   CT Chest Wo Contrast Result Date: 04/13/2024 EXAM: CT CHEST WITHOUT CONTRAST 04/13/2024 09:45:00 PM TECHNIQUE: CT of the chest was performed without the administration of intravenous contrast. Multiplanar reformatted images are provided for review. Automated exposure control, iterative reconstruction, and/or weight based adjustment of the mA/kV was utilized to reduce the radiation dose to as low as reasonably achievable. COMPARISON: 03/24/2016, 10/27/2020 CLINICAL HISTORY: Abnormal xray - lung nodule, >= 1 cm. FINDINGS: MEDIASTINUM: Heart and pericardium are unremarkable. The central airways are clear. Calcific aortic atherosclerosis and coronary artery calcification. LYMPH NODES: No mediastinal, hilar or axillary lymphadenopathy. LUNGS AND PLEURA: Masslike opacity at the right lung apex measuring 2.9 x 2.4 cm with surrounding architectural distortion. This is continuous with areas of apical pleuroparenchymal scarring. No pulmonary edema. No pleural effusion or  pneumothorax. SOFT TISSUES/BONES: No acute abnormality of the bones or soft tissues. UPPER ABDOMEN: Limited images of the upper abdomen demonstrates a left adrenal mass measuring 1.6 x 2.8 cm with attenuation of 12 HU. IMPRESSION: 1. Masslike opacity at the right lung apex measuring 2.9 x 2.4 cm with surrounding architectural distortion. There is likely a component of superimposed radiation fibrosis. Correlation with PET/CT and comparison to prior PET/CT from 10/27/20 may be helpful in determining what part of this, if any, is neoplastic. 2. Left adrenal mass measuring 1.6 x 2.8 cm with attenuation  of 12 HU, increased in size since 2022. Consider further evaluation with MRI or adrenal protocol CT on a non-emergent basis. Electronically signed by: Franky Stanford MD 04/13/2024 10:54 PM EST RP Workstation: [id number]   DG Chest 2 View Result Date: 04/13/2024 CLINICAL DATA:  Shortness of breath EXAM: CHEST - 2 VIEW COMPARISON:  12/02/2023, 07/05/2023, 06/08/2021, 06/28/2023, PET CT 10/27/2020 FINDINGS: Emphysema. Chronic pleuroparenchymal scarring in the left lower lung. No acute airspace disease, pleural effusion or pneumothorax. Irregular pleuroparenchymal opacity at the right apex, appears slightly progressive compared with March examinations. Stable cardiomediastinal silhouette with aortic atherosclerosis IMPRESSION: 1. No active cardiopulmonary disease. Emphysema. 2. Irregular pleuroparenchymal opacity at the right apex, appears slightly progressive compared with March examinations. Recommend correlation with chest CT. Electronically Signed   By: Luke Bun M.D.   On: 04/13/2024 17:23   I personally spent a total of 81 minutes in the care of the patient today including preparing to see the patient, getting/reviewing separately obtained history, performing a medically appropriate exam/evaluation, counseling and educating, placing orders, referring and communicating with other health care professionals,  documenting clinical information in the EHR, independently interpreting results, communicating results, and coordinating care.   Annalee Orem, MD Infectious Disease Physician [facility] Pager: [phone]      [1]  Allergies Allergen Reactions   Chantix  Continuing Month [Varenicline ] Other (See Comments)    Doesn't work for patient   Januvia [Sitagliptin] Other (See Comments)    Class Contraindicated   Lisinopril Swelling    Possible tongue swelling. ARB's okay   Motrin [Ibuprofen] Other (See Comments)    Contraindicated by Renal disease   Remeron [Mirtazapine] Other (See Comments)    Excess somnolence on the 15 mg dose   Penicillins Other (See Comments)    Facial swelling   "

## 2024-05-10 NOTE — Progress Notes (Signed)
 Modified Barium Swallow Study  Patient Details  Name: Jeffrey Randall. MRN: [id number] Date of Birth: 1940/07/10  Today's Date: 05/10/2024  Modified Barium Swallow completed.  Full report located under Chart Review in the Imaging Section.  History of Present Illness 84 y.o. male admitted wtih cavitary PNA. Imaging showed new focal areas of  irregular consolidation with cavitation and surrounding infiltration  demonstrated in both lungs. The largest cavitary lesion on the right mid lung suspicious for necrotizing PNA vs septic emboli. Patient with medical history significant for COPD, CKD 3b, HTN, DM2, hx. lung cancer s/p radiation, gout, chronic normocytic anemia, history of pulmonary embolism in 2024 on Eliquis . Recent admission for Flu A and bronchitis admitted 1/13-1/16.   Clinical Impression Pt exhibits oropharyngeal dysphagia characterized by mild impairments related to safety and efficiency. There is decreased bolus cohesion with liquids progressing posteriorly to the valleculae and pooling there before pt initiates a swallow. Oral residuals also progress to the pharynx and are eventually cleared with an independent subswallow but in the interim, as these residuals progress, there is trace penetration of thin and nectar thick liquids (PAS 3). Epiglottic inversion and laryngeal elevation are complete at the height of the swallow. A pinpoint amount of thin liquids reached the vocal folds x1 when taking the barium tablet and were ejected with cued throat clearance (PAS 4). Recommend he continue current diet, encouraging use of a protective throat clear intermittently. SLP will f/u at least briefly for ongoing education.   DIGEST Swallow Severity Rating*  Safety: 1  Efficiency: 1  Overall Pharyngeal Swallow Severity: 1 (mild) 1: mild; 2: moderate; 3: severe; 4: profound  *The Dynamic Imaging Grade of Swallowing Toxicity is standardized for the head and neck cancer population, however,  demonstrates promising clinical applications across populations to standardize the clinical rating of pharyngeal swallow safety and severity.  Factors that may increase risk of adverse event in presence of aspiration Noe & Lianne 2021): Respiratory or GI disease  Swallow Evaluation Recommendations Recommendations: PO diet PO Diet Recommendation: Regular;Thin liquids (Level 0) Liquid Administration via: Cup;Straw Medication Administration: Whole meds with puree Supervision: Patient able to self-feed Postural changes: Position pt fully upright for meals Oral care recommendations: Oral care BID (2x/day)    Damien Blumenthal, M.A., CCC-SLP Speech Language Pathology, Acute Rehabilitation Services  Secure Chat preferred [phone]  05/10/2024,2:25 PM

## 2024-05-10 NOTE — Evaluation (Signed)
 Clinical/Bedside Swallow Evaluation Patient Details  Name: Jeffrey Randall. MRN: [id number] Date of Birth: 13-Sep-1940  Today's Date: 05/10/2024 Time: SLP Start Time (ACUTE ONLY): 0905 SLP Stop Time (ACUTE ONLY): 0929 SLP Time Calculation (min) (ACUTE ONLY): 24 min  Past Medical History:  Past Medical History:  Diagnosis Date   Alcoholism in recovery [facility])    Aortic atherosclerosis    Back pain 06/17/2014   Chronic lung disease    CKD (chronic kidney disease)    COPD (chronic obstructive pulmonary disease) (HCC) 07/23/2014   Essential hypertension    Gout    History of lung cancer    History of pancreatitis    Iron deficiency anemia    Lumbar disc disease    Mixed hyperlipidemia    Notalgia    Prostate hypertrophy    Pulmonary nodules 07/23/2014   Swelling    Tobacco use disorder 07/23/2014   Tumor of lung 01/26/2021   Type 2 diabetes mellitus without complication    Uncontrolled type 2 diabetes mellitus with hyperglycemia, without long-term current use of insulin  (HCC)    Past Surgical History:  Past Surgical History:  Procedure Laterality Date   BACK SURGERY     HPI:  84 y.o. male admitted wtih cavitary PNA. Imaging showed new focal areas of  irregular consolidation with cavitation and surrounding infiltration  demonstrated in both lungs. The largest cavitary lesion on the right mid lung suspicious for necrotizing PNA vs septic emboli. Patient with medical history significant for COPD, CKD 3b, HTN, DM2, hx. lung cancer s/p radiation, gout, chronic normocytic anemia, history of pulmonary embolism in 2024 on Eliquis . Recent admission for Flu A and bronchitis admitted 1/13-1/16.    Assessment / Plan / Recommendation  Clinical Impression  Patient presents with what appears to be normal oropharyngeal swallowing function, consistent with previous exam from SLP in 11/2023. He does however have a h/o COPD which increases the risk for silent aspiration and with acute PNA, recommend  instrumental testing to evaluate swallowing physiology and r/o aspiration. This was the recommendation from previous bedside exam as well should patient begin to develop PNA. Planning for MBS at 1330 today. SLP Visit Diagnosis: Dysphagia, unspecified (R13.10)          Other Recommendations Oral Care Recommendations: Oral care BID     Swallow Evaluation Recommendations Recommendations: PO diet PO Diet Recommendation: Regular;Thin liquids (Level 0) Liquid Administration via: Cup;Straw Medication Administration: Whole meds with liquid Supervision: Patient able to self-feed Swallowing strategies  : Slow rate;Small bites/sips Postural changes: Position pt fully upright for meals Oral care recommendations: Oral care BID (2x/day)     Swallow Study   General HPI: 84 y.o. male admitted wtih cavitary PNA. Imaging showed new focal areas of  irregular consolidation with cavitation and surrounding infiltration  demonstrated in both lungs. The largest cavitary lesion on the right mid lung suspicious for necrotizing PNA vs septic emboli. Patient with medical history significant for COPD, CKD 3b, HTN, DM2, hx. lung cancer s/p radiation, gout, chronic normocytic anemia, history of pulmonary embolism in 2024 on Eliquis . Recent admission for Flu A and bronchitis admitted 1/13-1/16. Type of Study: Bedside Swallow Evaluation Previous Swallow Assessment: seen at bedside by SLP 11/2023, normal, recommended a regular diet. Diet Prior to this Study: NPO Temperature Spikes Noted: No Respiratory Status: Nasal cannula History of Recent Intubation: No Behavior/Cognition: Alert;Cooperative;Pleasant mood Oral Cavity Assessment: Within Functional Limits Oral Care Completed by SLP: Recent completion by staff Oral Cavity - Dentition: Dentures, top;Dentures,  bottom Vision: Functional for self-feeding Self-Feeding Abilities: Able to feed self Patient Positioning: Upright in bed Baseline Vocal Quality: Normal Volitional  Cough: Strong;Congested Volitional Swallow: Able to elicit    Oral/Motor/Sensory Function Overall Oral Motor/Sensory Function: Within functional limits   Ice Chips Ice chips: Not tested   Thin Liquid Thin Liquid: Within functional limits Presentation: Cup;Self Fed;Straw    Nectar Thick Nectar Thick Liquid: Not tested   Honey Thick Honey Thick Liquid: Not tested   Puree Puree: Within functional limits Presentation: Self Fed;Spoon   Solid     Solid: Within functional limits Presentation: Self Fed     Stepanie Graver MA, CCC-SLP  Reshonda Koerber Meryl 05/10/2024,11:31 AM

## 2024-05-10 NOTE — Progress Notes (Signed)
 Echocardiogram 2D Echocardiogram has been performed.  Jeffrey Randall 05/10/2024, 11:48 AM

## 2024-05-11 ENCOUNTER — Inpatient Hospital Stay (HOSPITAL_COMMUNITY)

## 2024-05-11 DIAGNOSIS — J851 Abscess of lung with pneumonia: Secondary | ICD-10-CM | POA: Diagnosis not present

## 2024-05-11 DIAGNOSIS — J449 Chronic obstructive pulmonary disease, unspecified: Secondary | ICD-10-CM | POA: Diagnosis not present

## 2024-05-11 DIAGNOSIS — R634 Abnormal weight loss: Secondary | ICD-10-CM | POA: Diagnosis not present

## 2024-05-11 DIAGNOSIS — N189 Chronic kidney disease, unspecified: Secondary | ICD-10-CM

## 2024-05-11 DIAGNOSIS — J984 Other disorders of lung: Secondary | ICD-10-CM | POA: Diagnosis not present

## 2024-05-11 DIAGNOSIS — J9621 Acute and chronic respiratory failure with hypoxia: Secondary | ICD-10-CM

## 2024-05-11 DIAGNOSIS — I2782 Chronic pulmonary embolism: Secondary | ICD-10-CM | POA: Diagnosis not present

## 2024-05-11 DIAGNOSIS — R1312 Dysphagia, oropharyngeal phase: Secondary | ICD-10-CM

## 2024-05-11 DIAGNOSIS — N1832 Chronic kidney disease, stage 3b: Secondary | ICD-10-CM | POA: Diagnosis not present

## 2024-05-11 DIAGNOSIS — I10 Essential (primary) hypertension: Secondary | ICD-10-CM | POA: Diagnosis not present

## 2024-05-11 DIAGNOSIS — Z85118 Personal history of other malignant neoplasm of bronchus and lung: Secondary | ICD-10-CM | POA: Diagnosis not present

## 2024-05-11 DIAGNOSIS — J168 Pneumonia due to other specified infectious organisms: Secondary | ICD-10-CM | POA: Diagnosis not present

## 2024-05-11 DIAGNOSIS — J189 Pneumonia, unspecified organism: Secondary | ICD-10-CM | POA: Diagnosis not present

## 2024-05-11 LAB — COMPREHENSIVE METABOLIC PANEL WITH GFR
ALT: 15 U/L (ref 0–44)
AST: 14 U/L — ABNORMAL LOW (ref 15–41)
Albumin: 2.3 g/dL — ABNORMAL LOW (ref 3.5–5.0)
Alkaline Phosphatase: 113 U/L (ref 38–126)
Anion gap: 7 (ref 5–15)
BUN: 46 mg/dL — ABNORMAL HIGH (ref 8–23)
CO2: 23 mmol/L (ref 22–32)
Calcium: 8.2 mg/dL — ABNORMAL LOW (ref 8.9–10.3)
Chloride: 110 mmol/L (ref 98–111)
Creatinine, Ser: 3 mg/dL — ABNORMAL HIGH (ref 0.61–1.24)
GFR, Estimated: 20 mL/min — ABNORMAL LOW
Glucose, Bld: 136 mg/dL — ABNORMAL HIGH (ref 70–99)
Potassium: 4.7 mmol/L (ref 3.5–5.1)
Sodium: 140 mmol/L (ref 135–145)
Total Bilirubin: <0.2 mg/dL (ref 0.0–1.2)
Total Protein: 5.4 g/dL — ABNORMAL LOW (ref 6.5–8.1)

## 2024-05-11 LAB — BASIC METABOLIC PANEL WITH GFR
Anion gap: 8 (ref 5–15)
BUN: 48 mg/dL — ABNORMAL HIGH (ref 8–23)
CO2: 23 mmol/L (ref 22–32)
Calcium: 8 mg/dL — ABNORMAL LOW (ref 8.9–10.3)
Chloride: 107 mmol/L (ref 98–111)
Creatinine, Ser: 3.05 mg/dL — ABNORMAL HIGH (ref 0.61–1.24)
GFR, Estimated: 19 mL/min — ABNORMAL LOW
Glucose, Bld: 223 mg/dL — ABNORMAL HIGH (ref 70–99)
Potassium: 4.7 mmol/L (ref 3.5–5.1)
Sodium: 138 mmol/L (ref 135–145)

## 2024-05-11 LAB — GLUCOSE, CAPILLARY
Glucose-Capillary: 123 mg/dL — ABNORMAL HIGH (ref 70–99)
Glucose-Capillary: 171 mg/dL — ABNORMAL HIGH (ref 70–99)
Glucose-Capillary: 173 mg/dL — ABNORMAL HIGH (ref 70–99)
Glucose-Capillary: 201 mg/dL — ABNORMAL HIGH (ref 70–99)
Glucose-Capillary: 267 mg/dL — ABNORMAL HIGH (ref 70–99)
Glucose-Capillary: 347 mg/dL — ABNORMAL HIGH (ref 70–99)
Glucose-Capillary: 487 mg/dL — ABNORMAL HIGH (ref 70–99)

## 2024-05-11 LAB — CBC WITH DIFFERENTIAL/PLATELET
Abs Immature Granulocytes: 0.04 10*3/uL (ref 0.00–0.07)
Basophils Absolute: 0 10*3/uL (ref 0.0–0.1)
Basophils Relative: 0 %
Eosinophils Absolute: 0.2 10*3/uL (ref 0.0–0.5)
Eosinophils Relative: 3 %
HCT: 23.1 % — ABNORMAL LOW (ref 39.0–52.0)
Hemoglobin: 7.3 g/dL — ABNORMAL LOW (ref 13.0–17.0)
Immature Granulocytes: 0 %
Lymphocytes Relative: 8 %
Lymphs Abs: 0.7 10*3/uL (ref 0.7–4.0)
MCH: 30.4 pg (ref 26.0–34.0)
MCHC: 31.6 g/dL (ref 30.0–36.0)
MCV: 96.3 fL (ref 80.0–100.0)
Monocytes Absolute: 0.5 10*3/uL (ref 0.1–1.0)
Monocytes Relative: 5 %
Neutro Abs: 7.9 10*3/uL — ABNORMAL HIGH (ref 1.7–7.7)
Neutrophils Relative %: 84 %
Platelets: 351 10*3/uL (ref 150–400)
RBC: 2.4 MIL/uL — ABNORMAL LOW (ref 4.22–5.81)
RDW: 14.5 % (ref 11.5–15.5)
WBC: 9.3 10*3/uL (ref 4.0–10.5)
nRBC: 0 % (ref 0.0–0.2)

## 2024-05-11 LAB — CBC
HCT: 21.9 % — ABNORMAL LOW (ref 39.0–52.0)
Hemoglobin: 7.1 g/dL — ABNORMAL LOW (ref 13.0–17.0)
MCH: 30.6 pg (ref 26.0–34.0)
MCHC: 32.4 g/dL (ref 30.0–36.0)
MCV: 94.4 fL (ref 80.0–100.0)
Platelets: 318 10*3/uL (ref 150–400)
RBC: 2.32 MIL/uL — ABNORMAL LOW (ref 4.22–5.81)
RDW: 14.3 % (ref 11.5–15.5)
WBC: 11.1 10*3/uL — ABNORMAL HIGH (ref 4.0–10.5)
nRBC: 0 % (ref 0.0–0.2)

## 2024-05-11 LAB — LEGIONELLA PNEUMOPHILA SEROGP 1 UR AG: L. pneumophila Serogp 1 Ur Ag: NEGATIVE

## 2024-05-11 LAB — HIV ANTIBODY (ROUTINE TESTING W REFLEX): HIV Screen 4th Generation wRfx: NONREACTIVE

## 2024-05-11 LAB — PRO BRAIN NATRIURETIC PEPTIDE: Pro Brain Natriuretic Peptide: 2066 pg/mL — ABNORMAL HIGH

## 2024-05-11 LAB — D-DIMER, QUANTITATIVE: D-Dimer, Quant: 2.8 ug{FEU}/mL — ABNORMAL HIGH (ref 0.00–0.50)

## 2024-05-11 MED ORDER — ALBUTEROL SULFATE (2.5 MG/3ML) 0.083% IN NEBU
5.0000 mg | INHALATION_SOLUTION | Freq: Once | RESPIRATORY_TRACT | Status: AC
  Administered 2024-05-11: 5 mg via RESPIRATORY_TRACT
  Filled 2024-05-11: qty 6

## 2024-05-11 MED ORDER — HYDROMORPHONE HCL 1 MG/ML IJ SOLN
0.5000 mg | Freq: Once | INTRAMUSCULAR | Status: AC
  Administered 2024-05-11: 0.5 mg via INTRAVENOUS

## 2024-05-11 MED ORDER — INSULIN GLARGINE 100 UNIT/ML ~~LOC~~ SOLN
15.0000 [IU] | Freq: Every day | SUBCUTANEOUS | Status: DC
  Administered 2024-05-11: 15 [IU] via SUBCUTANEOUS
  Filled 2024-05-11 (×3): qty 0.15

## 2024-05-11 MED ORDER — BUDESONIDE 0.5 MG/2ML IN SUSP
0.5000 mg | Freq: Two times a day (BID) | RESPIRATORY_TRACT | Status: AC
  Administered 2024-05-11 – 2024-05-18 (×15): 0.5 mg via RESPIRATORY_TRACT
  Filled 2024-05-11 (×15): qty 2

## 2024-05-11 MED ORDER — REVEFENACIN 175 MCG/3ML IN SOLN
175.0000 ug | Freq: Every day | RESPIRATORY_TRACT | Status: DC
  Administered 2024-05-11 – 2024-05-13 (×3): 175 ug via RESPIRATORY_TRACT
  Filled 2024-05-11 (×4): qty 3

## 2024-05-11 MED ORDER — HYDROMORPHONE HCL 1 MG/ML IJ SOLN
INTRAMUSCULAR | Status: AC
  Filled 2024-05-11: qty 1

## 2024-05-11 MED ORDER — ARFORMOTEROL TARTRATE 15 MCG/2ML IN NEBU
15.0000 ug | INHALATION_SOLUTION | Freq: Two times a day (BID) | RESPIRATORY_TRACT | Status: AC
  Administered 2024-05-11 – 2024-05-18 (×15): 15 ug via RESPIRATORY_TRACT
  Filled 2024-05-11 (×16): qty 2

## 2024-05-11 MED ORDER — IPRATROPIUM-ALBUTEROL 0.5-2.5 (3) MG/3ML IN SOLN
3.0000 mL | Freq: Four times a day (QID) | RESPIRATORY_TRACT | Status: DC
  Administered 2024-05-12 (×4): 3 mL via RESPIRATORY_TRACT
  Filled 2024-05-11 (×5): qty 3

## 2024-05-11 MED ORDER — HYDROMORPHONE HCL 1 MG/ML IJ SOLN
0.5000 mg | INTRAMUSCULAR | Status: AC | PRN
  Administered 2024-05-12 – 2024-05-18 (×11): 0.5 mg via INTRAVENOUS
  Filled 2024-05-11 (×12): qty 1

## 2024-05-11 MED ORDER — HYDROMORPHONE HCL 1 MG/ML IJ SOLN: 1.0000 mg | INTRAMUSCULAR | Status: DC | PRN

## 2024-05-11 NOTE — Progress Notes (Signed)
 Speech Language Pathology Treatment: Dysphagia  Patient Details Name: Jeffrey Randall. MRN: [id number] DOB: 1940/10/11 Today's Date: 05/11/2024 Time: 9043-8993 SLP Time Calculation (min) (ACUTE ONLY): 10 min  Assessment / Plan / Recommendation Clinical Impression  Reviewed images and recommendations from MBS. Pt independently recalled protective throat clear and used it intermittently when drinking thin liquids. He fed himself regular solids, clearing his oral cavity completely. Continue current diet with meds whole in puree. Consider use of increased caution and reassessment in times of acute illness but SLP will sign off at this time.    HPI HPI: 84 y.o. male admitted wtih cavitary PNA. Imaging showed new focal areas of  irregular consolidation with cavitation and surrounding infiltration  demonstrated in both lungs. The largest cavitary lesion on the right mid lung suspicious for necrotizing PNA vs septic emboli. Patient with medical history significant for COPD, CKD 3b, HTN, DM2, hx. lung cancer s/p radiation, gout, chronic normocytic anemia, history of pulmonary embolism in 2024 on Eliquis . Recent admission for Flu A and bronchitis admitted 1/13-1/16.      SLP Plan  All goals met        Swallow Evaluation Recommendations   Recommendations: PO diet PO Diet Recommendation: Regular;Thin liquids (Level 0) Liquid Administration via: Cup;Straw Medication Administration: Whole meds with puree Supervision: Patient able to self-feed Postural changes: Position pt fully upright for meals Oral care recommendations: Oral care BID (2x/day)     Recommendations                     Oral care BID   None Dysphagia, oropharyngeal phase (R13.12)     All goals met     Damien Blumenthal, M.A., CCC-SLP Speech Language Pathology, Acute Rehabilitation Services  Secure Chat preferred [phone]   05/11/2024, 10:11 AM

## 2024-05-11 NOTE — Consult Note (Signed)
 "  NAME:  Jeffrey Dines., MRN:  [id number], DOB:  [birth year], LOS: 2 ADMISSION DATE:  05/09/2024, CONSULTATION DATE:  05/11/24 REFERRING MD:  [facility] CHIEF COMPLAINT:  Cavitary Lung Lesion   History of Present Illness:  Jeffrey Randall is an 84 year old male with COPD, NSCLC of right upper lobe s/p SBRT in 2022 who is admitted for necrotizing pneumonia. He presented with increasing cough, wheezing and dyspnea.   Patient recently admitted 1/14 to 1/16 for influenza A infection. He was seen in hospital follow up in pulmonary clinic on 1/22.   CT Chest scan 04/13/24 did not indicated a cavitary lesion but did note a mass like opacity measuring 2.9 x 2.4cm with surrounding architectural distortion, likely secondary to prior radiation. Left adrenal mass increasing in size since 2022, MRI recommended.  CT Chest 05/09/24 shows new focal areas of irregular consolidation with cavitation and surrounding infiltration, largest in right middle lung measuring 5.5cm, small leasion in the superior segment of left loer lobe measuring 1.3 x 2.8cm.   PCCM consulted for further evaluation. ID has been consulted.   Pertinent  Medical History   Past Medical History:  Diagnosis Date   Alcoholism in recovery [facility])    Aortic atherosclerosis    Back pain 06/17/2014   Chronic lung disease    CKD (chronic kidney disease)    COPD (chronic obstructive pulmonary disease) (HCC) 07/23/2014   Essential hypertension    Gout    History of lung cancer    History of pancreatitis    Iron deficiency anemia    Lumbar disc disease    Mixed hyperlipidemia    Notalgia    Prostate hypertrophy    Pulmonary nodules 07/23/2014   Swelling    Tobacco use disorder 07/23/2014   Tumor of lung 01/26/2021   Type 2 diabetes mellitus without complication    Uncontrolled type 2 diabetes mellitus with hyperglycemia, without long-term current use of insulin  (HCC)    Significant Hospital Events: Including procedures, antibiotic start and  stop dates in addition to other pertinent events     Interim History / Subjective:  Patient with dyspnea and cough. Reports not having much sputum production. No fevers, chills or night sweats. No hemoptysis.  Objective    Blood pressure (!) 141/54, pulse (!) 56, temperature 97.7 F (36.5 C), temperature source Oral, resp. rate 18, height 5' 9 (1.753 m), weight 66.3 kg, SpO2 99%.        Intake/Output Summary (Last 24 hours) at 05/11/2024 1525 Last data filed at 05/11/2024 1217 Gross per 24 hour  Intake 441.71 ml  Output 875 ml  Net -433.29 ml   Filed Weights   05/09/24 1451 05/10/24 1854  Weight: 66 kg 66.3 kg    Examination: General: elderly male, no distress, laying in bed HENT: [place]/AT, moist mucous membranes Lungs: diminished, no wheezing Cardiovascular: rrr, no murmurs Abdomen: soft, non-tender, non-distended Extremities: warm, no edema Neuro: alert, moving all extremities GU: n/a  Resolved problem list   Assessment and Plan   Acute on Chronic Hypoxemic Respiratory Failure COPD Cavitary Pneumonia Oropharyngeal Dysphagia Hx of NSCLC of right upper lung  Discussion: Patient with new consolidations and caviatary lesion of right middle lung which is new from 3-4 weeks ago on CT imaging. Given timeline, this is concerning for infectious etiology. Also given his oropharyngeal dysphagia and recent influenza illness, there is concern for aspiration.   Plan: - TB work up per Infectious disease - If he is unable to  cough up sputum samples, can consider bronchoscopy with BAL early next week. Low TB risk overall. - Recommend dysphagia precauations - Agree with cefepime  and flagyl  for cavitary pneumonia coverage.  - Will follow sputum cultures to direct antibiotic therapy - start budesonide , brovana  and yupelri  nebulizer treatments and PRN albuterol   PCCM will see again on 2/2. Please call over weekend if needed.   Labs   CBC: Recent Labs  Lab 05/09/24 1604  05/10/24 0200 05/11/24 0612  WBC 7.8 5.8 11.1*  HGB 8.1* 7.9* 7.1*  HCT 26.1* 25.9* 21.9*  MCV 97.0 98.9 94.4  PLT 313 277 318    Basic Metabolic Panel: Recent Labs  Lab 05/09/24 1604 05/09/24 2208 05/10/24 0200 05/11/24 0612  NA 141  --  141 138  K 4.6  --  4.6 4.7  CL 108  --  109 107  CO2 27  --  24 23  GLUCOSE 226*  --  146* 223*  BUN 33*  --  33* 48*  CREATININE 3.04*  --  2.95* 3.05*  CALCIUM 8.4*  --  8.6* 8.0*  MG  --  2.2 2.3  --   PHOS  --  3.0 3.1  --    GFR: Estimated Creatinine Clearance: 16.9 mL/min (A) (by C-G formula based on SCr of 3.05 mg/dL (H)). Recent Labs  Lab 05/09/24 1604 05/09/24 2208 05/09/24 2219 05/10/24 0035 05/10/24 0200 05/11/24 0612  PROCALCITON  --  0.20  --   --   --   --   WBC 7.8  --   --   --  5.8 11.1*  LATICACIDVEN  --   --  0.9 0.7  --   --     Liver Function Tests: Recent Labs  Lab 05/10/24 0200  AST 14*  ALT 14  ALKPHOS 116  BILITOT <0.2  PROT 5.9*  ALBUMIN  2.4*   No results for input(s): LIPASE, AMYLASE in the last 168 hours. No results for input(s): AMMONIA in the last 168 hours.  ABG    Component Value Date/Time   PHART 7.28 (L) 07/02/2023 0856   PCO2ART 37 07/02/2023 0856   PO2ART 133 (H) 07/02/2023 0856   HCO3 26.8 05/09/2024 0200   TCO2 32 04/24/2024 1122   ACIDBASEDEF 0.1 05/09/2024 0200   O2SAT 38.6 05/09/2024 0200     Coagulation Profile: No results for input(s): INR, PROTIME in the last 168 hours.  Cardiac Enzymes: Recent Labs  Lab 05/09/24 2208  CKTOTAL 65    HbA1C: Hgb A1c MFr Bld  Date/Time Value Ref Range Status  12/02/2023 03:29 AM 11.2 (H) 4.8 - 5.6 % Final    Comment:    (NOTE) Diagnosis of Diabetes The following HbA1c ranges recommended by the American Diabetes Association (ADA) may be used as an aid in the diagnosis of diabetes mellitus.  Hemoglobin             Suggested A1C NGSP%              Diagnosis  <5.7                   Non Diabetic  5.7-6.4                 Pre-Diabetic  >6.4                   Diabetic  <7.0                   Glycemic control for  adults with diabetes.    06/29/2023 10:19 AM 9.9 (H) 4.8 - 5.6 % Final    Comment:    (NOTE) Pre diabetes:          5.7%-6.4%  Diabetes:              >6.4%  Glycemic control for   <7.0% adults with diabetes     CBG: Recent Labs  Lab 05/10/24 2317 05/11/24 0045 05/11/24 0407 05/11/24 0749 05/11/24 1157  GLUCAP 407* 347* 267* 201* 123*    Review of Systems:   Review of Systems  Constitutional:  Negative for chills, fever, malaise/fatigue and weight loss.  HENT:  Negative for congestion, sinus pain and sore throat.   Eyes: Negative.   Respiratory:  Positive for cough and shortness of breath. Negative for hemoptysis, sputum production and wheezing.   Cardiovascular:  Negative for chest pain, palpitations, orthopnea, claudication and leg swelling.  Gastrointestinal:  Negative for abdominal pain, heartburn, nausea and vomiting.  Genitourinary: Negative.   Musculoskeletal:  Negative for joint pain and myalgias.  Skin:  Negative for rash.  Neurological:  Negative for weakness.  Endo/Heme/Allergies: Negative.   Psychiatric/Behavioral: Negative.       Past Medical History:  He,  has a past medical history of Alcoholism in recovery [facility]), Aortic atherosclerosis, Back pain (06/17/2014), Chronic lung disease, CKD (chronic kidney disease), COPD (chronic obstructive pulmonary disease) (HCC) (07/23/2014), Essential hypertension, Gout, History of lung cancer, History of pancreatitis, Iron deficiency anemia, Lumbar disc disease, Mixed hyperlipidemia, Notalgia, Prostate hypertrophy, Pulmonary nodules (07/23/2014), Swelling, Tobacco use disorder (07/23/2014), Tumor of lung (01/26/2021), Type 2 diabetes mellitus without complication, and Uncontrolled type 2 diabetes mellitus with hyperglycemia, without long-term current use of insulin  (HCC).   Surgical History:    Past Surgical History:  Procedure Laterality Date   BACK SURGERY       Social History:   reports that he has been smoking cigarettes. He started smoking about 62 years ago. He has a 90 pack-year smoking history. He uses smokeless tobacco. He reports that he does not currently use alcohol after a past usage of about 3.0 standard drinks of alcohol per week. He reports that he does not use drugs.   Family History:  His family history includes Lung cancer in his father and mother.   Allergies Allergies[1]   Home Medications  Prior to Admission medications  Medication Sig Start Date End Date Taking? Authorizing Provider  albuterol  (VENTOLIN  HFA) 108 (90 Base) MCG/ACT inhaler Inhale 1-2 puffs into the lungs every 6 (six) hours as needed for wheezing or shortness of breath. Patient taking differently: Inhale 2 puffs into the lungs in the morning and at bedtime. 12/02/23  Yes Perri DELENA Meliton Mickey., MD  allopurinol (ZYLOPRIM) 100 MG tablet Take 100 mg by mouth every morning. 02/25/22  Yes [provider]  amLODipine  (NORVASC ) 10 MG tablet Take 1 tablet (10 mg total) by mouth daily. Patient taking differently: Take 5 mg by mouth in the morning and at bedtime. 04/28/24 05/28/24 Yes Leotis Bogus, MD  apixaban  (ELIQUIS ) 5 MG TABS tablet Take 1 tablet (5 mg total) by mouth 2 (two) times daily. Start this once the eliquis  starter pack is complete. 01/02/24  Yes Perri DELENA Meliton Mickey., MD  carvedilol  (COREG ) 12.5 MG tablet Take 1 tablet (12.5 mg total) by mouth 2 (two) times daily with a meal. 04/27/24 05/27/24 Yes Khatri, Bogus, MD  Cyanocobalamin (VITAMIN B-12 CR) 1500 MCG TBCR Take 1,500 mcg by mouth daily.  Yes [provider]  dapagliflozin propanediol (FARXIGA) 10 MG TABS tablet Take 10 mg by mouth daily. 09/03/21  Yes [provider]  DM-Doxylamine-Acetaminophen  (NYQUIL COLD & FLU PO) Take 1 Dose by mouth every 6 (six) hours as needed (cold/cough).   Yes [provider]  finasteride  (PROSCAR ) 5 MG tablet Take 5 mg by mouth daily. 02/25/22  Yes [provider]  Fluticasone -Umeclidin-Vilant (TRELEGY ELLIPTA ) 200-62.5-25 MCG/ACT AEPB Inhale 1 puff into the lungs daily. 05/03/24 05/03/25 Yes Mannam, Praveen, MD  guaifenesin  (ROBITUSSIN) 100 MG/5ML syrup Take 200 mg by mouth 3 (three) times daily as needed for cough.   Yes [provider]  insulin  degludec (TRESIBA ) 100 UNIT/ML FlexTouch Pen Inject 25 Units into the skin daily. Patient taking differently: Inject 20 Units into the skin daily. 12/02/23  Yes Perri DELENA Meliton Mickey., MD  ipratropium-albuterol  (DUONEB) 0.5-2.5 (3) MG/3ML SOLN Take 3 mLs by nebulization every 4 (four) hours as needed (Wheezing, SOB). Patient taking differently: Take 3 mLs by nebulization daily. 04/13/24  Yes Ula Prentice SAUNDERS, MD  Multiple Vitamins-Minerals (MULTIVITAMIN WITH MINERALS) tablet Take 1 tablet by mouth daily.   Yes [provider]  Omega-3 Fatty Acids (FISH OIL) 1200 MG CAPS Take 1,200 mg by mouth daily.   Yes [provider]  Phenyleph-Doxylamine-DM-APAP (ALKA-SELTZER PLS ALLERGY & CGH PO) Take 1 Dose by mouth every 4 (four) hours as needed (cold/cough).   Yes [provider]  Pseudoephedrine-APAP-DM (DAYQUIL MULTI-SYMPTOM COLD/FLU PO) Take 1 Dose by mouth every 6 (six) hours as needed (cold/cough).   Yes [provider]  Pseudoephedrine-Ibuprofen (ADVIL COLD/SINUS PO) Take 1 Dose by mouth every 6 (six) hours as needed (cold/sinus).   Yes [provider]  SAMBUCOL BLACK ELDERBERRY PO Take 1 Dose by mouth daily as needed (immune support).   Yes [provider]  simvastatin  (ZOCOR ) 10 MG tablet Take 10 mg by mouth at bedtime. 02/25/22  Yes [provider]  tamsulosin  (FLOMAX ) 0.4 MG CAPS capsule Take 0.8 mg by mouth at bedtime. 02/25/22  Yes [provider]  oseltamivir  (TAMIFLU ) 30 MG capsule Take 30 mg by mouth. Patient not taking:  Reported on 05/10/2024    [provider]  predniSONE  (DELTASONE ) 10 MG tablet Take 6 tablets (60 mg total) by mouth daily with breakfast. Take 60 mg/day on day 1 and reduce dose by 10 mg every 2 days until taper is complete Patient not taking: Reported on 05/10/2024 05/03/24   Mannam, Praveen, MD     Critical care time: n/a    Dorn Chill, MD [facility] Office: [phone]   See Amion for personal pager PCCM on call pager [phone] until 7pm. Please call Elink 7p-7a. [phone]            [1]  Allergies Allergen Reactions   Chantix  Continuing Month [Varenicline ] Other (See Comments)    Doesn't work for patient   Januvia [Sitagliptin] Other (See Comments)    Class Contraindicated   Lisinopril Swelling    Possible tongue swelling. ARB's okay   Motrin [Ibuprofen] Other (See Comments)    Contraindicated by Renal disease   Remeron [Mirtazapine] Other (See Comments)    Excess somnolence on the 15 mg dose   Penicillins Other (See Comments)    Facial swelling   "

## 2024-05-11 NOTE — Progress Notes (Signed)
 1826: Pt with labored breathing/tachypnea. Nebulizer given per [facility]. Pt reports not helpful and still visibly labored. Respiratory contacted to initiate bipap per order in chart.  1829: Pt tripodding. Respiratory arrived at bedside.

## 2024-05-11 NOTE — Significant Event (Signed)
 Rapid Response Event Note   Reason for Call :  Respiratory distress  Initial Focused Assessment:  Pt sitting on side of bed in tripod position. His breathing is tachypneic and labored. Lungs with decreased air movement. ABD soft/NT. Skin warm, diaphoretic.   HR-59, BP-116/86, RR-63, SpO2-100% on bipap .40   Interventions:  Albuterol  tx x 2 0.5mg  dilaudid  IV x 1 PCXR CMP/CBCD/BNP/D-dimer Dilaudid  0.5mg  Q3H PRN for sustained tachypnea(>24bpm) Plan of Care:  Pt breathing is better after interventions. Await PCXR results. Dilaudid  ordered prn for sustained tachypnea. Continue to monitor pt closely. Please call RRT if further assistance needed.   Event Summary:   MD Notified: Dr. Annita) and Dr. Sherrell (PCCM) notified. Dr. Sherrell to bedside to assess Call Time:2023 Arrival Time:2030 End Time:2115  Tish Graeme Piety, RN

## 2024-05-11 NOTE — Progress Notes (Signed)
 [facility]  PROGRESS NOTE  Jeffrey Randall. FMW:[id number] DOB: 11-17-1940 DOA: 05/09/2024 PCP: Sheldon Netter, PA   Brief HPI:    84 y.o. male with medical history significant of   COPD, CKD 3b, HTN, DM2, hx. lung cancer s/p radiation, gout, chronic normocytic anemia, history of pulmonary embolism in 2024 on Eliquis   Presented with worsening shortness of breath cough Patient presented with coughing and shortness of breath patient has known history of COPD foot which he wears 2 L of oxygen .  Symptoms has progressed and he is now having trouble ambulating no associated fevers no abdominal pain no chest pain no fever nausea or vomiting he is on Eliquis  in emergency department patient appears to be tachypneic and wheezing with slightly elevated troponin 150s but stable he was given DuoNeb Imaging which showed cavitary lesion on the right middle lung suspicious for necrotizing pneumonia versus septic emboli Started on Rocephin  azithromycin       Assessment/Plan:     Acute respiratory failure with hypoxia (HCC) Chronically on 2 L Continue to titrate O2 as needed Increased oxygen  requirement in the setting of necrotizing pneumonia with cavitary lesion   BPH (benign prostatic hyperplasia) Continue Flomax , finasteride  - Will check renal ultrasound  CKD stage IIIb - Creatinine at baseline   Cavitary pneumonia -Started on vancomycin , Flagyl  and cefepime  -MRSA negative, vancomycin  discontinued -ID consulted, started on airborne precautions Check for Legionella and strep pneumo -CT scan shows worsening of right upper lobe consolidation with cavitation and surrounding infiltration both lungs, largest in the right middle lung likely infectious or inflammatory;?  Necrotizing pneumonia, septic emboli -ID consulted, -Recommend to continue cefepime  and Flagyl , vancomycin  discontinued -Continue airborne precautions; follow blood cultures, QuantiFERON - Will consult pulmonology   COPD  (chronic obstructive pulmonary disease) (HCC) Continue home medications - Recently completed prednisone  taper   Essential hypertension Continue Norvasc  10 mg a day  Coreg  12.5 mg twice daily   History of BPH Continue home meds   Insulin  dependent type 2 diabetes mellitus (HCC) Continue sliding scale insulin  NovoLog  Will increase Lantus  to 15 units subcu daily   Pulmonary embolism (HCC) continue Eliquis  2 times daily    DVT prophylaxis: Apixaban   Medications     amLODipine   10 mg Oral Daily   apixaban   5 mg Oral BID   carvedilol   12.5 mg Oral BID WC   finasteride   5 mg Oral Daily   guaiFENesin   600 mg Oral BID   insulin  aspart  0-6 Units Subcutaneous Q4H   insulin  glargine  10 Units Subcutaneous Q2200   simvastatin   10 mg Oral QHS   tamsulosin   0.8 mg Oral QHS     Data Reviewed:   CBG:  Recent Labs  Lab 05/10/24 1954 05/10/24 2317 05/11/24 0045 05/11/24 0407 05/11/24 0749  GLUCAP 490* 407* 347* 267* 201*    SpO2: 99 % O2 Flow Rate (L/min): 2 L/min    Vitals:   05/10/24 2341 05/11/24 0409 05/11/24 0752 05/11/24 0915  BP: 133/80 (!) 128/48 (!) 138/51 (!) 138/51  Pulse: 62 61 62   Resp: 19 18 18    Temp: 97.8 F (36.6 C) 97.8 F (36.6 C) (!) 97.5 F (36.4 C)   TempSrc: Oral Oral Oral   SpO2: 95% 100% 99%   Weight:      Height:          Data Reviewed:  Basic Metabolic Panel: Recent Labs  Lab 05/09/24 1604 05/09/24 2208 05/10/24 0200 05/11/24 0612  NA 141  --  141 138  K 4.6  --  4.6 4.7  CL 108  --  109 107  CO2 27  --  24 23  GLUCOSE 226*  --  146* 223*  BUN 33*  --  33* 48*  CREATININE 3.04*  --  2.95* 3.05*  CALCIUM 8.4*  --  8.6* 8.0*  MG  --  2.2 2.3  --   PHOS  --  3.0 3.1  --     CBC: Recent Labs  Lab 05/09/24 1604 05/10/24 0200 05/11/24 0612  WBC 7.8 5.8 11.1*  HGB 8.1* 7.9* 7.1*  HCT 26.1* 25.9* 21.9*  MCV 97.0 98.9 94.4  PLT 313 277 318    LFT Recent Labs  Lab 05/10/24 0200  AST 14*  ALT 14  ALKPHOS 116   BILITOT <0.2  PROT 5.9*  ALBUMIN  2.4*     Antibiotics: Anti-infectives (From admission, onward)    Start     Dose/Rate Route Frequency Ordered Stop   05/11/24 2315  vancomycin  (VANCOCIN ) IVPB 1000 mg/200 mL premix  Status:  Discontinued        1,000 mg 200 mL/hr over 60 Minutes Intravenous Every 48 hours 05/09/24 2303 05/10/24 1444   05/09/24 2330  ceFEPIme  (MAXIPIME ) 2 g in sodium chloride  0.9 % 100 mL IVPB        2 g 200 mL/hr over 30 Minutes Intravenous Every 24 hours 05/09/24 2324     05/09/24 2330  metroNIDAZOLE  (FLAGYL ) IVPB 500 mg        500 mg 100 mL/hr over 60 Minutes Intravenous Every 12 hours 05/09/24 2325     05/09/24 2315  vancomycin  (VANCOREADY) IVPB 1500 mg/300 mL        1,500 mg 150 mL/hr over 120 Minutes Intravenous  Once 05/09/24 2303 05/10/24 0340   05/09/24 1845  cefTRIAXone  (ROCEPHIN ) 1 g in sodium chloride  0.9 % 100 mL IVPB        1 g 200 mL/hr over 30 Minutes Intravenous  Once 05/09/24 1832 05/09/24 1930   05/09/24 1845  azithromycin  (ZITHROMAX ) tablet 500 mg        500 mg Oral  Once 05/09/24 1832 05/09/24 1851        CONSULTS infectious disease  Code Status: Full code  Family Communication: No family at bedside     Subjective   Patient seen and examined, denies chest pain or shortness of breath.  Has been coughing up phlegm.   Objective    Physical Examination:  General-appears in no acute distress Heart-S1-S2, regular, no murmur auscultated Lungs-clear to auscultation bilaterally, no wheezing or crackles auscultated Abdomen-soft, nontender, no organomegaly Extremities-no edema in the lower extremities Neuro-alert, oriented x3, no focal deficit noted             Jeffrey Randall S Jeffrey Randall   [facility] If 7PM-7AM, please contact night-coverage at [email or web address], Office  [phone]   05/11/2024, 9:31 AM  LOS: 2 days

## 2024-05-12 DIAGNOSIS — J189 Pneumonia, unspecified organism: Secondary | ICD-10-CM | POA: Diagnosis not present

## 2024-05-12 DIAGNOSIS — J9601 Acute respiratory failure with hypoxia: Secondary | ICD-10-CM | POA: Diagnosis not present

## 2024-05-12 DIAGNOSIS — J850 Gangrene and necrosis of lung: Secondary | ICD-10-CM | POA: Diagnosis not present

## 2024-05-12 DIAGNOSIS — J984 Other disorders of lung: Secondary | ICD-10-CM | POA: Diagnosis not present

## 2024-05-12 DIAGNOSIS — I2782 Chronic pulmonary embolism: Secondary | ICD-10-CM | POA: Diagnosis not present

## 2024-05-12 DIAGNOSIS — N400 Enlarged prostate without lower urinary tract symptoms: Secondary | ICD-10-CM | POA: Diagnosis not present

## 2024-05-12 DIAGNOSIS — Z794 Long term (current) use of insulin: Secondary | ICD-10-CM | POA: Diagnosis not present

## 2024-05-12 DIAGNOSIS — E119 Type 2 diabetes mellitus without complications: Secondary | ICD-10-CM | POA: Diagnosis not present

## 2024-05-12 LAB — GLUCOSE, CAPILLARY
Glucose-Capillary: 56 mg/dL — ABNORMAL LOW (ref 70–99)
Glucose-Capillary: 84 mg/dL (ref 70–99)
Glucose-Capillary: 205 mg/dL — ABNORMAL HIGH (ref 70–99)
Glucose-Capillary: 48 mg/dL — ABNORMAL LOW (ref 70–99)
Glucose-Capillary: 73 mg/dL (ref 70–99)
Glucose-Capillary: 75 mg/dL (ref 70–99)
Glucose-Capillary: 178 mg/dL — ABNORMAL HIGH (ref 70–99)
Glucose-Capillary: 98 mg/dL (ref 70–99)
Glucose-Capillary: 63 mg/dL — ABNORMAL LOW (ref 70–99)

## 2024-05-12 LAB — EXPECTORATED SPUTUM ASSESSMENT W GRAM STAIN, RFLX TO RESP C

## 2024-05-12 MED ORDER — DEXTROSE 50 % IV SOLN
12.5000 g | INTRAVENOUS | Status: AC
  Administered 2024-05-12: 12.5 g via INTRAVENOUS

## 2024-05-12 MED ORDER — HALOPERIDOL LACTATE 5 MG/ML IJ SOLN
5.0000 mg | Freq: Once | INTRAMUSCULAR | Status: DC | PRN
  Filled 2024-05-12: qty 1

## 2024-05-12 MED ORDER — DEXTROSE 50 % IV SOLN
INTRAVENOUS | Status: AC
  Filled 2024-05-12: qty 50

## 2024-05-12 NOTE — Progress Notes (Signed)
 [facility]  PROGRESS NOTE  [name] FMW:[id number] DOB: 03-May-1940 DOA: 05/09/2024 PCP: Sheldon Netter, PA   Brief HPI:    84 y.o. male with medical history significant of   COPD, CKD 3b, HTN, DM2, hx. lung cancer s/p radiation, gout, chronic normocytic anemia, history of pulmonary embolism in 2024 on Eliquis   Presented with worsening shortness of breath cough Patient presented with coughing and shortness of breath patient has known history of COPD foot which he wears 2 L of oxygen .  Symptoms has progressed and he is now having trouble ambulating no associated fevers no abdominal pain no chest pain no fever nausea or vomiting he is on Eliquis  in emergency department patient appears to be tachypneic and wheezing with slightly elevated troponin 150s but stable he was given DuoNeb Imaging which showed cavitary lesion on the right middle lung suspicious for necrotizing pneumonia versus septic emboli Started on Rocephin  azithromycin       Assessment/Plan:     Acute respiratory failure with hypoxia (HCC) Chronically on 2 L Continue to titrate O2 as needed Increased oxygen  requirement in the setting of necrotizing pneumonia with cavitary lesion   Cavitary pneumonia -Started on vancomycin , Flagyl  and cefepime  -MRSA negative, vancomycin  discontinued -ID consulted, started on airborne precautions Check for Legionella and strep pneumo -CT scan shows worsening of right upper lobe consolidation with cavitation and surrounding infiltration both lungs, largest in the right middle lung likely infectious or inflammatory;?  Necrotizing pneumonia, septic emboli -Recommend to continue cefepime  and Flagyl  -Continue airborne precautions; follow blood cultures, QuantiFERON -HIV negative - Pulmonology consulted, low suspicion for TB - Plan for possible BAL, bronchoscopy next week   COPD (chronic obstructive pulmonary disease) (HCC) Continue home medications - Recently completed  prednisone  taper   Essential hypertension Continue Norvasc  10 mg a day  Coreg  12.5 mg twice daily  BPH (benign prostatic hyperplasia) Continue Flomax , finasteride  - Will check renal ultrasound  CKD stage IIIb - Creatinine at baseline   Insulin  dependent type 2 diabetes mellitus (HCC) Continue sliding scale insulin  NovoLog  Will increase Lantus  to 15 units subcu daily   Pulmonary embolism (HCC) -D-dimer elevated 2.80 continue Eliquis  2 times daily    DVT prophylaxis: Apixaban   Medications     amLODipine   10 mg Oral Daily   apixaban   5 mg Oral BID   arformoterol   15 mcg Nebulization BID   budesonide  (PULMICORT ) nebulizer solution  0.5 mg Nebulization BID   carvedilol   12.5 mg Oral BID WC   dextrose        finasteride   5 mg Oral Daily   guaiFENesin   600 mg Oral BID   insulin  aspart  0-6 Units Subcutaneous Q4H   insulin  glargine  15 Units Subcutaneous Q2200   ipratropium-albuterol   3 mL Nebulization QID   revefenacin   175 mcg Nebulization Daily   simvastatin   10 mg Oral QHS   tamsulosin   0.8 mg Oral QHS     Data Reviewed:   CBG:  Recent Labs  Lab 05/11/24 1157 05/11/24 1601 05/11/24 1949 05/12/24 0042 05/12/24 0323  GLUCAP 123* 173* 171* 84 56*    SpO2: 100 % O2 Flow Rate (L/min): 4 L/min FiO2 (%): 40 %    Vitals:   05/12/24 0327 05/12/24 0330 05/12/24 0741 05/12/24 0742  BP: (!) 124/50  (!) 153/56   Pulse: 62 (!) 49 63   Resp: 18 17 20 20   Temp: (!) 97.4 F (36.3 C)     TempSrc: Axillary     SpO2:  100%  100%   Weight:      Height:          Data Reviewed:  Basic Metabolic Panel: Recent Labs  Lab 05/09/24 1604 05/09/24 2208 05/10/24 0200 05/11/24 0612 05/11/24 2139  NA 141  --  141 138 140  K 4.6  --  4.6 4.7 4.7  CL 108  --  109 107 110  CO2 27  --  24 23 23   GLUCOSE 226*  --  146* 223* 136*  BUN 33*  --  33* 48* 46*  CREATININE 3.04*  --  2.95* 3.05* 3.00*  CALCIUM 8.4*  --  8.6* 8.0* 8.2*  MG  --  2.2 2.3  --   --   PHOS  --   3.0 3.1  --   --     CBC: Recent Labs  Lab 05/09/24 1604 05/10/24 0200 05/11/24 0612 05/11/24 2139  WBC 7.8 5.8 11.1* 9.3  NEUTROABS  --   --   --  7.9*  HGB 8.1* 7.9* 7.1* 7.3*  HCT 26.1* 25.9* 21.9* 23.1*  MCV 97.0 98.9 94.4 96.3  PLT 313 277 318 351    LFT Recent Labs  Lab 05/10/24 0200 05/11/24 2139  AST 14* 14*  ALT 14 15  ALKPHOS 116 113  BILITOT <0.2 <0.2  PROT 5.9* 5.4*  ALBUMIN  2.4* 2.3*     Antibiotics: Anti-infectives (From admission, onward)    Start     Dose/Rate Route Frequency Ordered Stop   05/11/24 2315  vancomycin  (VANCOCIN ) IVPB 1000 mg/200 mL premix  Status:  Discontinued        1,000 mg 200 mL/hr over 60 Minutes Intravenous Every 48 hours 05/09/24 2303 05/10/24 1444   05/09/24 2330  ceFEPIme  (MAXIPIME ) 2 g in sodium chloride  0.9 % 100 mL IVPB        2 g 200 mL/hr over 30 Minutes Intravenous Every 24 hours 05/09/24 2324     05/09/24 2330  metroNIDAZOLE  (FLAGYL ) IVPB 500 mg        500 mg 100 mL/hr over 60 Minutes Intravenous Every 12 hours 05/09/24 2325     05/09/24 2315  vancomycin  (VANCOREADY) IVPB 1500 mg/300 mL        1,500 mg 150 mL/hr over 120 Minutes Intravenous  Once 05/09/24 2303 05/10/24 0340   05/09/24 1845  cefTRIAXone  (ROCEPHIN ) 1 g in sodium chloride  0.9 % 100 mL IVPB        1 g 200 mL/hr over 30 Minutes Intravenous  Once 05/09/24 1832 05/09/24 1930   05/09/24 1845  azithromycin  (ZITHROMAX ) tablet 500 mg        500 mg Oral  Once 05/09/24 1832 05/09/24 1851        CONSULTS infectious disease  Code Status: Full code  Family Communication: No family at bedside     Subjective   Patient seen and examined, denies chest pain or shortness of breath.  Coughing up clear phlegm.   Objective    Physical Examination:  General-appears in no acute distress Heart-S1-S2, regular, no murmur auscultated Lungs-scattered rhonchi bilaterally Abdomen-soft, nontender, no organomegaly Extremities-no edema in the lower  extremities Neuro-alert, oriented x3, no focal deficit noted            Sallie Maker S Derk Doubek   [facility] If 7PM-7AM, please contact night-coverage at [email or web address], Office  [phone]   05/12/2024, 10:00 AM  LOS: 3 days

## 2024-05-13 ENCOUNTER — Inpatient Hospital Stay (HOSPITAL_COMMUNITY)

## 2024-05-13 DIAGNOSIS — N400 Enlarged prostate without lower urinary tract symptoms: Secondary | ICD-10-CM | POA: Diagnosis not present

## 2024-05-13 DIAGNOSIS — J189 Pneumonia, unspecified organism: Secondary | ICD-10-CM | POA: Diagnosis not present

## 2024-05-13 DIAGNOSIS — J9601 Acute respiratory failure with hypoxia: Secondary | ICD-10-CM | POA: Diagnosis not present

## 2024-05-13 DIAGNOSIS — J850 Gangrene and necrosis of lung: Secondary | ICD-10-CM | POA: Diagnosis not present

## 2024-05-13 LAB — GLUCOSE, CAPILLARY
Glucose-Capillary: 61 mg/dL — ABNORMAL LOW (ref 70–99)
Glucose-Capillary: 79 mg/dL (ref 70–99)
Glucose-Capillary: 49 mg/dL — ABNORMAL LOW (ref 70–99)
Glucose-Capillary: 84 mg/dL (ref 70–99)
Glucose-Capillary: 163 mg/dL — ABNORMAL HIGH (ref 70–99)
Glucose-Capillary: 290 mg/dL — ABNORMAL HIGH (ref 70–99)
Glucose-Capillary: 227 mg/dL — ABNORMAL HIGH (ref 70–99)
Glucose-Capillary: 153 mg/dL — ABNORMAL HIGH (ref 70–99)

## 2024-05-13 LAB — QUANTIFERON-TB GOLD PLUS (RQFGPL)
QuantiFERON Mitogen Value: 0.03 [IU]/mL
QuantiFERON Nil Value: 0.02 [IU]/mL
QuantiFERON TB1 Ag Value: 0.02 [IU]/mL
QuantiFERON TB2 Ag Value: 0.02 [IU]/mL

## 2024-05-13 LAB — CBC
HCT: 25.4 % — ABNORMAL LOW (ref 39.0–52.0)
Hemoglobin: 8 g/dL — ABNORMAL LOW (ref 13.0–17.0)
MCH: 30.1 pg (ref 26.0–34.0)
MCHC: 31.5 g/dL (ref 30.0–36.0)
MCV: 95.5 fL (ref 80.0–100.0)
Platelets: 358 10*3/uL (ref 150–400)
RBC: 2.66 MIL/uL — ABNORMAL LOW (ref 4.22–5.81)
RDW: 14.7 % (ref 11.5–15.5)
WBC: 9 10*3/uL (ref 4.0–10.5)
nRBC: 0 % (ref 0.0–0.2)

## 2024-05-13 LAB — BLOOD GAS, ARTERIAL
Acid-base deficit: 3.2 mmol/L — ABNORMAL HIGH (ref 0.0–2.0)
Acid-base deficit: 0.4 mmol/L (ref 0.0–2.0)
Bicarbonate: 22.7 mmol/L (ref 20.0–28.0)
Bicarbonate: 25.9 mmol/L (ref 20.0–28.0)
Drawn by: 252031
O2 Saturation: 99 %
O2 Saturation: 100 %
Patient temperature: 36.4
Patient temperature: 37
pCO2 arterial: 42 mmHg (ref 32–48)
pCO2 arterial: 48 mmHg (ref 32–48)
pH, Arterial: 7.34 — ABNORMAL LOW (ref 7.35–7.45)
pH, Arterial: 7.34 — ABNORMAL LOW (ref 7.35–7.45)
pO2, Arterial: 88 mmHg (ref 83–108)
pO2, Arterial: 86 mmHg (ref 83–108)

## 2024-05-13 LAB — COMPREHENSIVE METABOLIC PANEL WITH GFR
ALT: 13 U/L (ref 0–44)
AST: 14 U/L — ABNORMAL LOW (ref 15–41)
Albumin: 2.2 g/dL — ABNORMAL LOW (ref 3.5–5.0)
Alkaline Phosphatase: 116 U/L (ref 38–126)
Anion gap: 9 (ref 5–15)
BUN: 43 mg/dL — ABNORMAL HIGH (ref 8–23)
CO2: 20 mmol/L — ABNORMAL LOW (ref 22–32)
Calcium: 8.3 mg/dL — ABNORMAL LOW (ref 8.9–10.3)
Chloride: 111 mmol/L (ref 98–111)
Creatinine, Ser: 2.93 mg/dL — ABNORMAL HIGH (ref 0.61–1.24)
GFR, Estimated: 20 mL/min — ABNORMAL LOW
Glucose, Bld: 63 mg/dL — ABNORMAL LOW (ref 70–99)
Potassium: 4.8 mmol/L (ref 3.5–5.1)
Sodium: 139 mmol/L (ref 135–145)
Total Bilirubin: <0.2 mg/dL (ref 0.0–1.2)
Total Protein: 5.6 g/dL — ABNORMAL LOW (ref 6.5–8.1)

## 2024-05-13 LAB — QUANTIFERON-TB GOLD PLUS: QuantiFERON-TB Gold Plus: UNDETERMINED — AB

## 2024-05-13 MED ORDER — FUROSEMIDE 10 MG/ML IJ SOLN
60.0000 mg | Freq: Once | INTRAMUSCULAR | Status: AC
  Administered 2024-05-13: 60 mg via INTRAVENOUS
  Filled 2024-05-13: qty 6

## 2024-05-13 MED ORDER — GLUCOSE 4 G PO CHEW
CHEWABLE_TABLET | ORAL | Status: AC
  Filled 2024-05-13: qty 1

## 2024-05-13 MED ORDER — ALPRAZOLAM 0.5 MG PO TABS
0.5000 mg | ORAL_TABLET | Freq: Three times a day (TID) | ORAL | Status: DC | PRN
  Administered 2024-05-13: 0.5 mg via ORAL
  Filled 2024-05-13: qty 1

## 2024-05-13 MED ORDER — LEVALBUTEROL HCL 1.25 MG/0.5ML IN NEBU
1.2500 mg | INHALATION_SOLUTION | Freq: Three times a day (TID) | RESPIRATORY_TRACT | Status: DC
  Administered 2024-05-13 (×2): 1.25 mg via RESPIRATORY_TRACT
  Filled 2024-05-13 (×7): qty 0.5

## 2024-05-13 MED ORDER — INSULIN GLARGINE 100 UNIT/ML ~~LOC~~ SOLN
10.0000 [IU] | Freq: Every day | SUBCUTANEOUS | Status: DC
  Administered 2024-05-13 – 2024-05-14 (×2): 10 [IU] via SUBCUTANEOUS
  Filled 2024-05-13 (×4): qty 0.1

## 2024-05-13 NOTE — Plan of Care (Signed)
  Problem: Elimination: Goal: Will not experience complications related to urinary retention Outcome: Progressing   Problem: Pain Managment: Goal: General experience of comfort will improve and/or be controlled Outcome: Progressing   Problem: Safety: Goal: Ability to remain free from injury will improve Outcome: Progressing

## 2024-05-13 NOTE — Progress Notes (Signed)
 [facility]  PROGRESS NOTE  Jeffrey Randall. FMW:[id number] DOB: 10-13-1940 DOA: 05/09/2024 PCP: Sheldon Netter, PA   Brief HPI:    84 y.o. male with medical history significant of   COPD, CKD 3b, HTN, DM2, hx. lung cancer s/p radiation, gout, chronic normocytic anemia, history of pulmonary embolism in 2024 on Eliquis   Presented with worsening shortness of breath cough Patient presented with coughing and shortness of breath patient has known history of COPD foot which he wears 2 L of oxygen .  Symptoms has progressed and he is now having trouble ambulating no associated fevers no abdominal pain no chest pain no fever nausea or vomiting he is on Eliquis  in emergency department patient appears to be tachypneic and wheezing with slightly elevated troponin 150s but stable he was given DuoNeb Imaging which showed cavitary lesion on the right middle lung suspicious for necrotizing pneumonia versus septic emboli Started on Rocephin  azithromycin       Assessment/Plan:     Acute respiratory failure with hypoxia (HCC) Chronically on 2 L Continue to titrate O2 as needed Increased oxygen  requirement in the setting of necrotizing pneumonia with cavitary lesion   Cavitary pneumonia -Started on vancomycin , Flagyl  and cefepime  -MRSA negative, vancomycin  discontinued -ID consulted, started on airborne precautions Check for Legionella and strep pneumo -CT scan shows worsening of right upper lobe consolidation with cavitation and surrounding infiltration both lungs, largest in the right middle lung likely infectious or inflammatory;?  Necrotizing pneumonia, septic emboli -Recommend to continue cefepime  and Flagyl  -Continue airborne precautions; follow blood cultures, QuantiFERON test is indeterminate -HIV negative - Pulmonology consulted, low suspicion for TB - Plan for possible BAL, bronchoscopy next week  ?  Acute diastolic CHF - BNP elevated to 2000 - Will give 1 dose of Lasix  60 mg IV    COPD (chronic obstructive pulmonary disease) (HCC) Continue home medications - Recently completed prednisone  taper   Essential hypertension Continue Norvasc  10 mg a day  Coreg  12.5 mg twice daily  BPH (benign prostatic hyperplasia) Continue Flomax , finasteride  - Will check renal ultrasound  CKD stage IIIb - Creatinine at baseline   Insulin  dependent type 2 diabetes mellitus (HCC) Continue sliding scale insulin  NovoLog  -CBG has been low -Will change Lantus  to 10 units subcu daily    Pulmonary embolism (HCC) -D-dimer elevated 2.80 continue Eliquis  2 times daily    DVT prophylaxis: Apixaban   Medications     amLODipine   10 mg Oral Daily   apixaban   5 mg Oral BID   arformoterol   15 mcg Nebulization BID   budesonide  (PULMICORT ) nebulizer solution  0.5 mg Nebulization BID   carvedilol   12.5 mg Oral BID WC   dextrose        finasteride   5 mg Oral Daily   glucose       guaiFENesin   600 mg Oral BID   insulin  aspart  0-6 Units Subcutaneous Q4H   insulin  glargine  15 Units Subcutaneous Q2200   ipratropium-albuterol   3 mL Nebulization QID   revefenacin   175 mcg Nebulization Daily   simvastatin   10 mg Oral QHS   tamsulosin   0.8 mg Oral QHS     Data Reviewed:   CBG:  Recent Labs  Lab 05/12/24 2047 05/12/24 2234 05/13/24 0107 05/13/24 0428 05/13/24 0745  GLUCAP 98 63* 61* 79 49*    SpO2: 100 % O2 Flow Rate (L/min): 5 L/min FiO2 (%): 40 %    Vitals:   05/12/24 2017 05/13/24 0106 05/13/24 0429 05/13/24 0739  BP: ROLLEN)  155/68 (!) 150/51 (!) 153/71 (!) 162/72  Pulse: 61 (!) 58 71 79  Resp: (!) 25 16 19 20   Temp: 98 F (36.7 C) 98 F (36.7 C) 97.9 F (36.6 C) (!) 97.5 F (36.4 C)  TempSrc: Oral Oral Oral Oral  SpO2: 100% 100% 98% 100%  Weight:      Height:          Data Reviewed:  Basic Metabolic Panel: Recent Labs  Lab 05/09/24 1604 05/09/24 2208 05/10/24 0200 05/11/24 0612 05/11/24 2139 05/13/24 0125  NA 141  --  141 138 140 139  K 4.6  --   4.6 4.7 4.7 4.8  CL 108  --  109 107 110 111  CO2 27  --  24 23 23  20*  GLUCOSE 226*  --  146* 223* 136* 63*  BUN 33*  --  33* 48* 46* 43*  CREATININE 3.04*  --  2.95* 3.05* 3.00* 2.93*  CALCIUM 8.4*  --  8.6* 8.0* 8.2* 8.3*  MG  --  2.2 2.3  --   --   --   PHOS  --  3.0 3.1  --   --   --     CBC: Recent Labs  Lab 05/09/24 1604 05/10/24 0200 05/11/24 0612 05/11/24 2139 05/13/24 0125  WBC 7.8 5.8 11.1* 9.3 9.0  NEUTROABS  --   --   --  7.9*  --   HGB 8.1* 7.9* 7.1* 7.3* 8.0*  HCT 26.1* 25.9* 21.9* 23.1* 25.4*  MCV 97.0 98.9 94.4 96.3 95.5  PLT 313 277 318 351 358    LFT Recent Labs  Lab 05/10/24 0200 05/11/24 2139 05/13/24 0125  AST 14* 14* 14*  ALT 14 15 13   ALKPHOS 116 113 116  BILITOT <0.2 <0.2 <0.2  PROT 5.9* 5.4* 5.6*  ALBUMIN  2.4* 2.3* 2.2*     Antibiotics: Anti-infectives (From admission, onward)    Start     Dose/Rate Route Frequency Ordered Stop   05/11/24 2315  vancomycin  (VANCOCIN ) IVPB 1000 mg/200 mL premix  Status:  Discontinued        1,000 mg 200 mL/hr over 60 Minutes Intravenous Every 48 hours 05/09/24 2303 05/10/24 1444   05/09/24 2330  ceFEPIme  (MAXIPIME ) 2 g in sodium chloride  0.9 % 100 mL IVPB        2 g 200 mL/hr over 30 Minutes Intravenous Every 24 hours 05/09/24 2324     05/09/24 2330  metroNIDAZOLE  (FLAGYL ) IVPB 500 mg        500 mg 100 mL/hr over 60 Minutes Intravenous Every 12 hours 05/09/24 2325     05/09/24 2315  vancomycin  (VANCOREADY) IVPB 1500 mg/300 mL        1,500 mg 150 mL/hr over 120 Minutes Intravenous  Once 05/09/24 2303 05/10/24 0340   05/09/24 1845  cefTRIAXone  (ROCEPHIN ) 1 g in sodium chloride  0.9 % 100 mL IVPB        1 g 200 mL/hr over 30 Minutes Intravenous  Once 05/09/24 1832 05/09/24 1930   05/09/24 1845  azithromycin  (ZITHROMAX ) tablet 500 mg        500 mg Oral  Once 05/09/24 1832 05/09/24 1851        CONSULTS infectious disease  Code Status: Full code  Family Communication: No family at  bedside     Subjective   Patient seen and examined, has been having episodes of tachypnea with worsening shortness of breath.  Objective    Physical Examination:   General-appears in no  acute distress Heart-S1-S2, regular, no murmur auscultated Lungs-decreased breath sounds at lung bases Abdomen-soft, nontender, no organomegaly Extremities-no edema in the lower extremities Neuro-alert, oriented x3, no focal deficit noted           Jeffrey Randall   [facility] If 7PM-7AM, please contact night-coverage at [email or web address], Office  [phone]   05/13/2024, 8:39 AM  LOS: 4 days

## 2024-05-14 DIAGNOSIS — J851 Abscess of lung with pneumonia: Secondary | ICD-10-CM | POA: Diagnosis not present

## 2024-05-14 DIAGNOSIS — R1312 Dysphagia, oropharyngeal phase: Secondary | ICD-10-CM | POA: Diagnosis not present

## 2024-05-14 DIAGNOSIS — J9621 Acute and chronic respiratory failure with hypoxia: Secondary | ICD-10-CM | POA: Diagnosis not present

## 2024-05-14 DIAGNOSIS — N400 Enlarged prostate without lower urinary tract symptoms: Secondary | ICD-10-CM | POA: Diagnosis not present

## 2024-05-14 DIAGNOSIS — J850 Gangrene and necrosis of lung: Secondary | ICD-10-CM | POA: Diagnosis not present

## 2024-05-14 DIAGNOSIS — J9601 Acute respiratory failure with hypoxia: Secondary | ICD-10-CM | POA: Diagnosis not present

## 2024-05-14 DIAGNOSIS — J189 Pneumonia, unspecified organism: Secondary | ICD-10-CM | POA: Diagnosis not present

## 2024-05-14 DIAGNOSIS — J449 Chronic obstructive pulmonary disease, unspecified: Secondary | ICD-10-CM | POA: Diagnosis not present

## 2024-05-14 LAB — COMPREHENSIVE METABOLIC PANEL WITH GFR
ALT: 14 U/L (ref 0–44)
AST: 14 U/L — ABNORMAL LOW (ref 15–41)
Albumin: 2.4 g/dL — ABNORMAL LOW (ref 3.5–5.0)
Alkaline Phosphatase: 125 U/L (ref 38–126)
Anion gap: 9 (ref 5–15)
BUN: 43 mg/dL — ABNORMAL HIGH (ref 8–23)
CO2: 23 mmol/L (ref 22–32)
Calcium: 8.4 mg/dL — ABNORMAL LOW (ref 8.9–10.3)
Chloride: 107 mmol/L (ref 98–111)
Creatinine, Ser: 3.03 mg/dL — ABNORMAL HIGH (ref 0.61–1.24)
GFR, Estimated: 20 mL/min — ABNORMAL LOW
Glucose, Bld: 156 mg/dL — ABNORMAL HIGH (ref 70–99)
Potassium: 4.9 mmol/L (ref 3.5–5.1)
Sodium: 139 mmol/L (ref 135–145)
Total Bilirubin: <0.2 mg/dL (ref 0.0–1.2)
Total Protein: 5.7 g/dL — ABNORMAL LOW (ref 6.5–8.1)

## 2024-05-14 LAB — CBC
HCT: 25.3 % — ABNORMAL LOW (ref 39.0–52.0)
Hemoglobin: 7.9 g/dL — ABNORMAL LOW (ref 13.0–17.0)
MCH: 29.9 pg (ref 26.0–34.0)
MCHC: 31.2 g/dL (ref 30.0–36.0)
MCV: 95.8 fL (ref 80.0–100.0)
Platelets: 364 10*3/uL (ref 150–400)
RBC: 2.64 MIL/uL — ABNORMAL LOW (ref 4.22–5.81)
RDW: 14.6 % (ref 11.5–15.5)
WBC: 9.6 10*3/uL (ref 4.0–10.5)
nRBC: 0 % (ref 0.0–0.2)

## 2024-05-14 LAB — CULTURE, BLOOD (ROUTINE X 2)
Culture: NO GROWTH
Culture: NO GROWTH
Special Requests: ADEQUATE
Special Requests: ADEQUATE

## 2024-05-14 LAB — CULTURE, RESPIRATORY W GRAM STAIN: Culture: NORMAL

## 2024-05-14 LAB — GLUCOSE, CAPILLARY
Glucose-Capillary: 123 mg/dL — ABNORMAL HIGH (ref 70–99)
Glucose-Capillary: 105 mg/dL — ABNORMAL HIGH (ref 70–99)
Glucose-Capillary: 246 mg/dL — ABNORMAL HIGH (ref 70–99)
Glucose-Capillary: 375 mg/dL — ABNORMAL HIGH (ref 70–99)
Glucose-Capillary: 285 mg/dL — ABNORMAL HIGH (ref 70–99)

## 2024-05-14 LAB — AMMONIA: Ammonia: 23 umol/L (ref 9–35)

## 2024-05-14 MED ORDER — REVEFENACIN 175 MCG/3ML IN SOLN
175.0000 ug | Freq: Every day | RESPIRATORY_TRACT | Status: AC
  Administered 2024-05-15 – 2024-05-18 (×4): 175 ug via RESPIRATORY_TRACT
  Filled 2024-05-14 (×6): qty 3

## 2024-05-14 MED ORDER — LEVALBUTEROL HCL 1.25 MG/0.5ML IN NEBU
1.2500 mg | INHALATION_SOLUTION | Freq: Four times a day (QID) | RESPIRATORY_TRACT | Status: DC
  Administered 2024-05-15: 1.25 mg via RESPIRATORY_TRACT
  Filled 2024-05-14 (×3): qty 0.5

## 2024-05-14 MED ORDER — SODIUM CHLORIDE 0.9 % IV SOLN
2.0000 g | INTRAVENOUS | Status: AC
  Administered 2024-05-14 – 2024-05-18 (×5): 2 g via INTRAVENOUS
  Filled 2024-05-14 (×5): qty 20

## 2024-05-14 NOTE — Plan of Care (Signed)

## 2024-05-14 NOTE — Consult Note (Signed)
 "  NAME:  Jeffrey Randall., MRN:  [id number], DOB:  1940-07-20, LOS: 5 ADMISSION DATE:  05/09/2024, CONSULTATION DATE:  05/11/24 REFERRING MD:  [facility] CHIEF COMPLAINT:  Cavitary Lung Lesion   History of Present Illness:  Jeffrey Randall is an 84 year old male with COPD, NSCLC of right upper lobe s/p SBRT in 2022 who is admitted for necrotizing pneumonia. He presented with increasing cough, wheezing and dyspnea.   Patient recently admitted 1/14 to 1/16 for influenza A infection. He was seen in hospital follow up in pulmonary clinic on 1/22.   CT Chest scan 04/13/24 did not indicated a cavitary lesion but did note a mass like opacity measuring 2.9 x 2.4cm with surrounding architectural distortion, likely secondary to prior radiation. Left adrenal mass increasing in size since 2022, MRI recommended.  CT Chest 05/09/24 shows new focal areas of irregular consolidation with cavitation and surrounding infiltration, largest in right middle lung measuring 5.5cm, small leasion in the superior segment of left loer lobe measuring 1.3 x 2.8cm.   PCCM consulted for further evaluation. ID has been consulted.   Pertinent  Medical History   Past Medical History:  Diagnosis Date   Alcoholism in recovery [facility])    Aortic atherosclerosis    Back pain 06/17/2014   Chronic lung disease    CKD (chronic kidney disease)    COPD (chronic obstructive pulmonary disease) (HCC) 07/23/2014   Essential hypertension    Gout    History of lung cancer    History of pancreatitis    Iron deficiency anemia    Lumbar disc disease    Mixed hyperlipidemia    Notalgia    Prostate hypertrophy    Pulmonary nodules 07/23/2014   Swelling    Tobacco use disorder 07/23/2014   Tumor of lung 01/26/2021   Type 2 diabetes mellitus without complication    Uncontrolled type 2 diabetes mellitus with hyperglycemia, without long-term current use of insulin  (HCC)    Significant Hospital Events: Including procedures, antibiotic start and  stop dates in addition to other pertinent events   1/29 admitted 1/30 PCCM consulted  Interim History / Subjective:   Patient mildly improved in regards to dyspnea over the weekend He has started to cough up more mucous, usually in the morning times Discussed with family at bedside, ok to move forward with bronch.  Objective    Blood pressure (!) 158/63, pulse 81, temperature 98.2 F (36.8 C), temperature source Oral, resp. rate (!) 22, height 5' 9 (1.753 m), weight 66.3 kg, SpO2 100%.    FiO2 (%):  [36 %] 36 %   Intake/Output Summary (Last 24 hours) at 05/14/2024 1244 Last data filed at 05/14/2024 0700 Gross per 24 hour  Intake --  Output 750 ml  Net -750 ml   Filed Weights   05/09/24 1451 05/10/24 1854  Weight: 66 kg 66.3 kg    Examination: General: elderly male, no distress, laying in bed HENT: [place]/AT, moist mucous membranes Lungs: diminished, mild wheezing on right Cardiovascular: rrr, no murmurs Abdomen: soft, non-tender, non-distended Extremities: warm, no edema Neuro: alert, moving all extremities GU: n/a  Resolved problem list   Assessment and Plan   Acute on Chronic Hypoxemic Respiratory Failure COPD Cavitary Pneumonia Oropharyngeal Dysphagia Hx of NSCLC of right upper lung  Discussion: Patient with new consolidations and caviatary lesion of right middle lung which is new from 3-4 weeks ago on CT imaging. Given timeline, this is concerning for infectious etiology. Also given his oropharyngeal  dysphagia and recent influenza illness, there is concern for aspiration.   Plan: - TB work up per Infectious disease, requesting bronchoscopy - Will plan for bronchoscopy with BAL tomorrow, NPO after midnight - Recommend dysphagia precauations - Agree with cefepime  and flagyl  for cavitary pneumonia coverage.  - Will follow sputum cultures to direct antibiotic therapy - continue budesonide , brovana  and yupelri  nebulizer treatments and PRN albuterol  - Recommend  transitioning to all nebulizer regimen as outpatient   Labs   CBC: Recent Labs  Lab 05/10/24 0200 05/11/24 0612 05/11/24 2139 05/13/24 0125 05/14/24 0129  WBC 5.8 11.1* 9.3 9.0 9.6  NEUTROABS  --   --  7.9*  --   --   HGB 7.9* 7.1* 7.3* 8.0* 7.9*  HCT 25.9* 21.9* 23.1* 25.4* 25.3*  MCV 98.9 94.4 96.3 95.5 95.8  PLT 277 318 351 358 364    Basic Metabolic Panel: Recent Labs  Lab 05/09/24 2208 05/10/24 0200 05/11/24 0612 05/11/24 2139 05/13/24 0125 05/14/24 0129  NA  --  141 138 140 139 139  K  --  4.6 4.7 4.7 4.8 4.9  CL  --  109 107 110 111 107  CO2  --  24 23 23  20* 23  GLUCOSE  --  146* 223* 136* 63* 156*  BUN  --  33* 48* 46* 43* 43*  CREATININE  --  2.95* 3.05* 3.00* 2.93* 3.03*  CALCIUM  --  8.6* 8.0* 8.2* 8.3* 8.4*  MG 2.2 2.3  --   --   --   --   PHOS 3.0 3.1  --   --   --   --    GFR: Estimated Creatinine Clearance: 17 mL/min (A) (by C-G formula based on SCr of 3.03 mg/dL (H)). Recent Labs  Lab 05/09/24 2208 05/09/24 2219 05/10/24 0035 05/10/24 0200 05/11/24 0612 05/11/24 2139 05/13/24 0125 05/14/24 0129  PROCALCITON 0.20  --   --   --   --   --   --   --   WBC  --   --   --    < > 11.1* 9.3 9.0 9.6  LATICACIDVEN  --  0.9 0.7  --   --   --   --   --    < > = values in this interval not displayed.    Liver Function Tests: Recent Labs  Lab 05/10/24 0200 05/11/24 2139 05/13/24 0125 05/14/24 0129  AST 14* 14* 14* 14*  ALT 14 15 13 14   ALKPHOS 116 113 116 125  BILITOT <0.2 <0.2 <0.2 <0.2  PROT 5.9* 5.4* 5.6* 5.7*  ALBUMIN  2.4* 2.3* 2.2* 2.4*   No results for input(s): LIPASE, AMYLASE in the last 168 hours. Recent Labs  Lab 05/14/24 0129  AMMONIA 23    ABG    Component Value Date/Time   PHART 7.34 (L) 05/13/2024 2240   PCO2ART 48 05/13/2024 2240   PO2ART 86 05/13/2024 2240   HCO3 25.9 05/13/2024 2240   TCO2 32 04/24/2024 1122   ACIDBASEDEF 0.4 05/13/2024 2240   O2SAT 100 05/13/2024 2240     Coagulation Profile: No  results for input(s): INR, PROTIME in the last 168 hours.  Cardiac Enzymes: Recent Labs  Lab 05/09/24 2208  CKTOTAL 65    HbA1C: Hgb A1c MFr Bld  Date/Time Value Ref Range Status  12/02/2023 03:29 AM 11.2 (H) 4.8 - 5.6 % Final    Comment:    (NOTE) Diagnosis of Diabetes The following HbA1c ranges recommended by the American  Diabetes Association (ADA) may be used as an aid in the diagnosis of diabetes mellitus.  Hemoglobin             Suggested A1C NGSP%              Diagnosis  <5.7                   Non Diabetic  5.7-6.4                Pre-Diabetic  >6.4                   Diabetic  <7.0                   Glycemic control for                       adults with diabetes.    06/29/2023 10:19 AM 9.9 (H) 4.8 - 5.6 % Final    Comment:    (NOTE) Pre diabetes:          5.7%-6.4%  Diabetes:              >6.4%  Glycemic control for   <7.0% adults with diabetes     CBG: Recent Labs  Lab 05/13/24 2015 05/13/24 2326 05/14/24 0433 05/14/24 0809 05/14/24 1121  GLUCAP 227* 153* 123* 105* 246*       Critical care time: n/a    Dorn Chill, MD [facility] Office: [phone]   See Amion for personal pager PCCM on call pager [phone] until 7pm. Please call Elink 7p-7a. [phone]          "

## 2024-05-15 DIAGNOSIS — J984 Other disorders of lung: Secondary | ICD-10-CM | POA: Diagnosis not present

## 2024-05-15 DIAGNOSIS — N189 Chronic kidney disease, unspecified: Secondary | ICD-10-CM | POA: Diagnosis not present

## 2024-05-15 DIAGNOSIS — J189 Pneumonia, unspecified organism: Secondary | ICD-10-CM | POA: Diagnosis not present

## 2024-05-15 DIAGNOSIS — Z880 Allergy status to penicillin: Secondary | ICD-10-CM | POA: Diagnosis not present

## 2024-05-15 DIAGNOSIS — R2231 Localized swelling, mass and lump, right upper limb: Secondary | ICD-10-CM

## 2024-05-15 LAB — CBC
HCT: 23.8 % — ABNORMAL LOW (ref 39.0–52.0)
Hemoglobin: 7.4 g/dL — ABNORMAL LOW (ref 13.0–17.0)
MCH: 30 pg (ref 26.0–34.0)
MCHC: 31.1 g/dL (ref 30.0–36.0)
MCV: 96.4 fL (ref 80.0–100.0)
Platelets: 342 10*3/uL (ref 150–400)
RBC: 2.47 MIL/uL — ABNORMAL LOW (ref 4.22–5.81)
RDW: 14.7 % (ref 11.5–15.5)
WBC: 8.5 10*3/uL (ref 4.0–10.5)
nRBC: 0 % (ref 0.0–0.2)

## 2024-05-15 LAB — COMPREHENSIVE METABOLIC PANEL WITH GFR
ALT: 14 U/L (ref 0–44)
AST: 11 U/L — ABNORMAL LOW (ref 15–41)
Albumin: 2.2 g/dL — ABNORMAL LOW (ref 3.5–5.0)
Alkaline Phosphatase: 117 U/L (ref 38–126)
Anion gap: 6 (ref 5–15)
BUN: 41 mg/dL — ABNORMAL HIGH (ref 8–23)
CO2: 23 mmol/L (ref 22–32)
Calcium: 8.4 mg/dL — ABNORMAL LOW (ref 8.9–10.3)
Chloride: 112 mmol/L — ABNORMAL HIGH (ref 98–111)
Creatinine, Ser: 2.86 mg/dL — ABNORMAL HIGH (ref 0.61–1.24)
GFR, Estimated: 21 mL/min — ABNORMAL LOW
Glucose, Bld: 75 mg/dL (ref 70–99)
Potassium: 4.7 mmol/L (ref 3.5–5.1)
Sodium: 142 mmol/L (ref 135–145)
Total Bilirubin: <0.2 mg/dL (ref 0.0–1.2)
Total Protein: 5.4 g/dL — ABNORMAL LOW (ref 6.5–8.1)

## 2024-05-15 LAB — GLUCOSE, CAPILLARY
Glucose-Capillary: 104 mg/dL — ABNORMAL HIGH (ref 70–99)
Glucose-Capillary: 126 mg/dL — ABNORMAL HIGH (ref 70–99)
Glucose-Capillary: 165 mg/dL — ABNORMAL HIGH (ref 70–99)
Glucose-Capillary: 180 mg/dL — ABNORMAL HIGH (ref 70–99)
Glucose-Capillary: 215 mg/dL — ABNORMAL HIGH (ref 70–99)
Glucose-Capillary: 251 mg/dL — ABNORMAL HIGH (ref 70–99)
Glucose-Capillary: 40 mg/dL — CL (ref 70–99)
Glucose-Capillary: 60 mg/dL — ABNORMAL LOW (ref 70–99)
Glucose-Capillary: 86 mg/dL (ref 70–99)

## 2024-05-15 MED ORDER — HYALURONIDASE HUMAN 150 UNIT/ML IJ SOLN
150.0000 [IU] | Freq: Once | INTRAMUSCULAR | Status: AC
  Administered 2024-05-15: 150 [IU] via SUBCUTANEOUS
  Filled 2024-05-15: qty 1

## 2024-05-15 MED ORDER — DEXTROSE 50 % IV SOLN
25.0000 g | INTRAVENOUS | Status: AC
  Administered 2024-05-15: 25 g via INTRAVENOUS
  Filled 2024-05-15: qty 50

## 2024-05-15 NOTE — Progress Notes (Signed)
 Hypoglycemic Event  CBG: 40  Treatment: D50 50 mL (25 gm)  Symptoms: Vision changes  Follow-up CBG: Upfz:[id number] CBG Result:60  Possible Reasons for Event: Inadequate meal intake and Medication regimen: Sliding scale insulin ; pt very sensitive  Comments/MD notified:At bedside      CBG: 60  Treatment: 60 oz apple juice  Follow-up CBG time: 0915 CBG Result: 104

## 2024-05-16 ENCOUNTER — Telehealth: Payer: Self-pay

## 2024-05-16 ENCOUNTER — Encounter (HOSPITAL_COMMUNITY): Payer: Self-pay | Admitting: Internal Medicine

## 2024-05-16 ENCOUNTER — Encounter (HOSPITAL_COMMUNITY): Admission: EM | Payer: Self-pay | Source: Home / Self Care | Attending: Family Medicine

## 2024-05-16 ENCOUNTER — Inpatient Hospital Stay (HOSPITAL_COMMUNITY)

## 2024-05-16 ENCOUNTER — Inpatient Hospital Stay (HOSPITAL_COMMUNITY): Admitting: Anesthesiology

## 2024-05-16 DIAGNOSIS — J9621 Acute and chronic respiratory failure with hypoxia: Secondary | ICD-10-CM | POA: Diagnosis not present

## 2024-05-16 DIAGNOSIS — M7989 Other specified soft tissue disorders: Secondary | ICD-10-CM

## 2024-05-16 DIAGNOSIS — R1312 Dysphagia, oropharyngeal phase: Secondary | ICD-10-CM | POA: Diagnosis not present

## 2024-05-16 DIAGNOSIS — J984 Other disorders of lung: Secondary | ICD-10-CM | POA: Diagnosis not present

## 2024-05-16 DIAGNOSIS — J189 Pneumonia, unspecified organism: Secondary | ICD-10-CM | POA: Diagnosis not present

## 2024-05-16 LAB — BODY FLUID CELL COUNT WITH DIFFERENTIAL
Eos, Fluid: 0 %
Lymphs, Fluid: 0 %
Monocyte-Macrophage-Serous Fluid: 1 % — ABNORMAL LOW (ref 50–90)
Neutrophil Count, Fluid: 99 % — ABNORMAL HIGH (ref 0–25)
Total Nucleated Cell Count, Fluid: 600 uL (ref 0–1000)

## 2024-05-16 LAB — GLUCOSE, CAPILLARY
Glucose-Capillary: 234 mg/dL — ABNORMAL HIGH (ref 70–99)
Glucose-Capillary: 239 mg/dL — ABNORMAL HIGH (ref 70–99)
Glucose-Capillary: 265 mg/dL — ABNORMAL HIGH (ref 70–99)
Glucose-Capillary: 277 mg/dL — ABNORMAL HIGH (ref 70–99)
Glucose-Capillary: 319 mg/dL — ABNORMAL HIGH (ref 70–99)
Glucose-Capillary: 322 mg/dL — ABNORMAL HIGH (ref 70–99)

## 2024-05-16 MED ORDER — DEXAMETHASONE SOD PHOSPHATE PF 10 MG/ML IJ SOLN
INTRAMUSCULAR | Status: DC | PRN
  Administered 2024-05-16: 5 mg via INTRAVENOUS

## 2024-05-16 MED ORDER — PHENYLEPHRINE HCL-NACL 20-0.9 MG/250ML-% IV SOLN
INTRAVENOUS | Status: DC | PRN
  Administered 2024-05-16: 25 ug/min via INTRAVENOUS

## 2024-05-16 MED ORDER — LIDOCAINE 2% (20 MG/ML) 5 ML SYRINGE
INTRAMUSCULAR | Status: DC | PRN
  Administered 2024-05-16: 40 mg via INTRAVENOUS

## 2024-05-16 MED ORDER — OXYCODONE HCL 5 MG/5ML PO SOLN: 5.0000 mg | Freq: Once | ORAL | Status: DC | PRN

## 2024-05-16 MED ORDER — ONDANSETRON HCL 4 MG/2ML IJ SOLN
INTRAMUSCULAR | Status: DC | PRN
  Administered 2024-05-16: 4 mg via INTRAVENOUS

## 2024-05-16 MED ORDER — FENTANYL CITRATE (PF) 100 MCG/2ML IJ SOLN: 25.0000 ug | INTRAMUSCULAR | Status: DC | PRN

## 2024-05-16 MED ORDER — INSULIN GLARGINE 100 UNIT/ML ~~LOC~~ SOLN
10.0000 [IU] | Freq: Every day | SUBCUTANEOUS | Status: AC
  Administered 2024-05-16 – 2024-05-18 (×3): 10 [IU] via SUBCUTANEOUS
  Filled 2024-05-16 (×4): qty 0.1

## 2024-05-16 MED ORDER — MEPERIDINE HCL 25 MG/ML IJ SOLN: 6.2500 mg | INTRAMUSCULAR | Status: DC | PRN

## 2024-05-16 MED ORDER — LACTATED RINGERS IV SOLN: INTRAVENOUS | Status: DC | PRN

## 2024-05-16 MED ORDER — FENTANYL CITRATE (PF) 100 MCG/2ML IJ SOLN
INTRAMUSCULAR | Status: AC
  Filled 2024-05-16: qty 2

## 2024-05-16 MED ORDER — EPHEDRINE SULFATE-NACL 50-0.9 MG/10ML-% IV SOSY
PREFILLED_SYRINGE | INTRAVENOUS | Status: DC | PRN
  Administered 2024-05-16: 10 mg via INTRAVENOUS

## 2024-05-16 MED ORDER — SODIUM CHLORIDE 3 % IN NEBU
4.0000 mL | INHALATION_SOLUTION | Freq: Two times a day (BID) | RESPIRATORY_TRACT | Status: AC
  Administered 2024-05-16 – 2024-05-18 (×5): 4 mL via RESPIRATORY_TRACT
  Filled 2024-05-16 (×6): qty 4

## 2024-05-16 MED ORDER — SUGAMMADEX SODIUM 200 MG/2ML IV SOLN
INTRAVENOUS | Status: DC | PRN
  Administered 2024-05-16: 200 mg via INTRAVENOUS

## 2024-05-16 MED ORDER — PHENYLEPHRINE 80 MCG/ML (10ML) SYRINGE FOR IV PUSH (FOR BLOOD PRESSURE SUPPORT)
PREFILLED_SYRINGE | INTRAVENOUS | Status: DC | PRN
  Administered 2024-05-16: 160 ug via INTRAVENOUS
  Administered 2024-05-16: 240 ug via INTRAVENOUS
  Administered 2024-05-16 (×2): 160 ug via INTRAVENOUS

## 2024-05-16 MED ORDER — OXYCODONE HCL 5 MG PO TABS: 5.0000 mg | ORAL_TABLET | Freq: Once | ORAL | Status: DC | PRN

## 2024-05-16 MED ORDER — PROPOFOL 10 MG/ML IV BOLUS
INTRAVENOUS | Status: DC | PRN
  Administered 2024-05-16: 80 mg via INTRAVENOUS

## 2024-05-16 MED ORDER — ONDANSETRON HCL 4 MG/2ML IJ SOLN: 4.0000 mg | Freq: Once | INTRAMUSCULAR | Status: DC | PRN

## 2024-05-16 MED ORDER — ROCURONIUM BROMIDE 10 MG/ML (PF) SYRINGE
PREFILLED_SYRINGE | INTRAVENOUS | Status: DC | PRN
  Administered 2024-05-16: 40 mg via INTRAVENOUS

## 2024-05-16 MED ORDER — PROPOFOL 500 MG/50ML IV EMUL
INTRAVENOUS | Status: DC | PRN
  Administered 2024-05-16: 125 ug/kg/min via INTRAVENOUS

## 2024-05-16 NOTE — Telephone Encounter (Unsigned)
 Copied from CRM #[id number]. Topic: Clinical - Medication Question >> May 16, 2024 10:56 AM Celestine FALCON wrote: Reason for CRM: Pt's daughter Izyk Marty is calling to reach pharmacist Aleck regarding medication the pt was trying to obtain assistance for. She stated the price was too much for the medication, and the clinic was assisting on looking for assistance programs to lower the cost down.  She stated she didn't return the phone call right away because her father has been in the hospital at [facility] for about a week now for Cavitary Pneumonia.   Please call her back at phone number [phone] ok to leave a vm.

## 2024-05-16 NOTE — Progress Notes (Signed)
 " [name]  FMW:[id number] DOB: 05/12/1940 DOA: 05/09/2024 PCP: Sheldon Netter, PA   Brief Narrative:  84 y.o. male with medical history significant of   COPD, CKD 3b, HTN, DM2, hx. lung cancer s/p radiation, gout, chronic normocytic anemia, history of pulmonary embolism in 2024 on Eliquis  who presented with worsening shortness of breath cough and found to have mild hypoxia, imaging concerning for cavitary pneumonia.  Hospitalist called for admission, ID and pulmonology also consulted for assistance.  Assessment & Plan:   Principal Problem:   Cavitary pneumonia Active Problems:   Essential hypertension   Insulin  dependent type 2 diabetes mellitus (HCC)   COPD (chronic obstructive pulmonary disease) (HCC)   Acute respiratory failure with hypoxia (HCC)   BPH (benign prostatic hyperplasia)   History of BPH   Pulmonary embolism (HCC)  Acute on chronic respiratory failure with hypoxia (HCC) Chronically on 2 L Continue to titrate O2 as needed (most recently requiring 5L [place]) Increased oxygen  requirement in the setting of necrotizing pneumonia with cavitary lesion   Cavitary pneumonia Rule out atypical infection - ID consulted and following - Imaging concerning for right upper lobe consolidation with cavitation and surrounding infiltration bilaterally - possible necrotizing/inflammatory etiology - Pulmonology consulted for BAL, performed 2/4 without complication - Continue cefepime , Flagyl  (MRSA negative, vancomycin  discontinued) - Initial QuantiFERON is indeterminate, continue airborne precautions with negative testing pending -risk for TB is low, no clear risk factors  Rule out acute diastolic CHF - BNP elevated - Diuresed well after giving Lasix  60 mg IV x 1 - Will hold further diuresis at this time as patient appears dry   COPD (chronic obstructive pulmonary disease) (HCC) - Continue home medications(currently on nebulizer equivalent of trelegy), not in  acute exacerbation - Recently completed prednisone  taper   Essential hypertension Continue Amlodipine  10 mg a day  Carvedilol  12.5 mg twice daily   BPH (benign prostatic hyperplasia) Continue Flomax , finasteride    CKD stage IIIb - Creatinine at baseline   Insulin  dependent type 2 diabetes mellitus (HCC) Continue sliding scale insulin  NovoLog  Continue Lantus  10 units subcu daily   Pulmonary embolism (HCC) RUE DVT, radial, POA -D-dimer elevated 2.80; continue Eliquis  2 times daily -Venous duplex of right upper extremity positive for radial DVT - already on anticoagulation   Normocytic anemia -Chronic,  -Anemia panel showed ferritin 477, serum iron 19, TIBC 162, saturation 12% -B12 3994, folate 18.1 - Hemoglobin stable around 7.5 - Continue to monitor H&H intermittently - Transfuse for hemoglobin less than 7  DVT prophylaxis: apixaban  (ELIQUIS ) tablet 5 mg  Code Status:   Code Status: Full Code Family Communication: Daughter at bedside  Status is: Inpatient  Dispo: The patient is from: Home              Anticipated d/c is to: Home              Anticipated d/c date is: TBD              Patient currently not medically stable for discharge  Consultants:  ID, PCCM  Procedures:  BAL 2/4  Antimicrobials:  Metronidazole /ceftriaxone    Subjective: No acute issues/events overnight - ongoing shortness of breath  Objective: Vitals:   05/16/24 1506 05/16/24 1511 05/16/24 1516 05/16/24 1546  BP: (!) 139/55 (!) 129/53 (!) 136/53 130/65  Pulse: 76 75 79 68  Resp: 20 18 18  (!) 22  Temp:    97.8 F (36.6 C)  TempSrc:  Oral  SpO2: 90% 91% 90%   Weight:      Height:        Intake/Output Summary (Last 24 hours) at 05/16/2024 1742 Last data filed at 05/16/2024 1444 Gross per 24 hour  Intake 300 ml  Output --  Net 300 ml   Filed Weights   05/09/24 1451 05/10/24 1854 05/16/24 1350  Weight: 66 kg 66.3 kg 66.3 kg    Examination:  General:  Pleasantly resting in bed,  No acute distress. HEENT:  Normocephalic atraumatic.  Sclerae nonicteric, noninjected.  Extraocular movements intact bilaterally. Neck:  Without mass or deformity.  Trachea is midline. Lungs:  diminished R>L, no overt wheeze or rales Heart:  Regular rate and rhythm.  Without murmurs, rubs, or gallops. Abdomen:  Soft, nontender, nondistended.  Without guarding or rebound. Extremities: Without cyanosis, clubbing, edema, or obvious deformity. Skin:  Warm and dry, no erythema.  Data Reviewed: I have personally reviewed following labs and imaging studies  CBC: Recent Labs  Lab 05/11/24 0612 05/11/24 2139 05/13/24 0125 05/14/24 0129 05/15/24 0319  WBC 11.1* 9.3 9.0 9.6 8.5  NEUTROABS  --  7.9*  --   --   --   HGB 7.1* 7.3* 8.0* 7.9* 7.4*  HCT 21.9* 23.1* 25.4* 25.3* 23.8*  MCV 94.4 96.3 95.5 95.8 96.4  PLT 318 351 358 364 342   Basic Metabolic Panel: Recent Labs  Lab 05/09/24 2208 05/10/24 0200 05/10/24 0200 05/11/24 0612 05/11/24 2139 05/13/24 0125 05/14/24 0129 05/15/24 0319  NA  --  141   < > 138 140 139 139 142  K  --  4.6   < > 4.7 4.7 4.8 4.9 4.7  CL  --  109   < > 107 110 111 107 112*  CO2  --  24   < > 23 23 20* 23 23  GLUCOSE  --  146*   < > 223* 136* 63* 156* 75  BUN  --  33*   < > 48* 46* 43* 43* 41*  CREATININE  --  2.95*   < > 3.05* 3.00* 2.93* 3.03* 2.86*  CALCIUM  --  8.6*   < > 8.0* 8.2* 8.3* 8.4* 8.4*  MG 2.2 2.3  --   --   --   --   --   --   PHOS 3.0 3.1  --   --   --   --   --   --    < > = values in this interval not displayed.   GFR: Estimated Creatinine Clearance: 18 mL/min (A) (by C-G formula based on SCr of 2.86 mg/dL (H)). Liver Function Tests: Recent Labs  Lab 05/10/24 0200 05/11/24 2139 05/13/24 0125 05/14/24 0129 05/15/24 0319  AST 14* 14* 14* 14* 11*  ALT 14 15 13 14 14   ALKPHOS 116 113 116 125 117  BILITOT <0.2 <0.2 <0.2 <0.2 <0.2  PROT 5.9* 5.4* 5.6* 5.7* 5.4*  ALBUMIN  2.4* 2.3* 2.2* 2.4* 2.2*   No results for input(s):  LIPASE, AMYLASE in the last 168 hours. Recent Labs  Lab 05/14/24 0129  AMMONIA 23   Coagulation Profile: No results for input(s): INR, PROTIME in the last 168 hours. Cardiac Enzymes: Recent Labs  Lab 05/09/24 2208  CKTOTAL 65   BNP (last 3 results) Recent Labs    04/13/24 1518 04/24/24 1104 05/11/24 2139  PROBNP 3,516.0* 1,427.0* 2,066.0*   HbA1C: No results for input(s): HGBA1C in the last 72 hours. CBG: Recent Labs  Lab  05/15/24 2327 05/16/24 0501 05/16/24 0832 05/16/24 1201 05/16/24 1540  GLUCAP 126* 265* 277* 234* 239*   Lipid Profile: No results for input(s): CHOL, HDL, LDLCALC, TRIG, CHOLHDL, LDLDIRECT in the last 72 hours. Thyroid  Function Tests: No results for input(s): TSH, T4TOTAL, FREET4, T3FREE, THYROIDAB in the last 72 hours. Anemia Panel: No results for input(s): VITAMINB12, FOLATE, FERRITIN, TIBC, IRON, RETICCTPCT in the last 72 hours. Sepsis Labs: Recent Labs  Lab 05/09/24 2208 05/09/24 2219 05/10/24 0035  PROCALCITON 0.20  --   --   LATICACIDVEN  --  0.9 0.7    Recent Results (from the past 240 hours)  Culture, blood (routine x 2)     Status: None   Collection Time: 05/09/24 10:06 PM   Specimen: BLOOD  Result Value Ref Range Status   Specimen Description BLOOD SITE NOT SPECIFIED  Final   Special Requests   Final    BOTTLES DRAWN AEROBIC AND ANAEROBIC Blood Culture adequate volume   Culture   Final    NO GROWTH 5 DAYS Performed at [facility], [address] [place], [place] [address]    Report Status 05/14/2024 FINAL  Final  Culture, blood (routine x 2)     Status: None   Collection Time: 05/09/24 10:06 PM   Specimen: BLOOD  Result Value Ref Range Status   Specimen Description BLOOD SITE NOT SPECIFIED  Final   Special Requests   Final    BOTTLES DRAWN AEROBIC AND ANAEROBIC Blood Culture adequate volume   Culture   Final    NO GROWTH 5 DAYS Performed at [facility],  [address], [place], [place] [address]    Report Status 05/14/2024 FINAL  Final  Expectorated Sputum Assessment w Gram Stain, Rflx to Resp Cult     Status: None   Collection Time: 05/09/24 10:52 PM   Specimen: Expectorated Sputum  Result Value Ref Range Status   Specimen Description EXPECTORATED SPUTUM  Final   Special Requests Immunocompromised  Final   Sputum evaluation   Final    THIS SPECIMEN IS ACCEPTABLE FOR SPUTUM CULTURE Performed at [facility], [address], [place], [place] [address]    Report Status 05/12/2024 FINAL  Final  Culture, Respiratory w Gram Stain     Status: None   Collection Time: 05/09/24 10:52 PM  Result Value Ref Range Status   Specimen Description EXPECTORATED SPUTUM  Final   Special Requests Immunocompromised Reflexed from [id number]  Final   Gram Stain   Final    RARE WBC PRESENT, PREDOMINANTLY PMN MODERATE GRAM POSITIVE COCCI IN CLUSTERS FEW YEAST FEW GRAM NEGATIVE RODS RARE GRAM POSITIVE RODS    Culture   Final    FEW Normal respiratory flora-no Staph aureus or Pseudomonas seen Performed at [facility], [address], [place], [place] [address]    Report Status 05/14/2024 FINAL  Final  Respiratory (~20 pathogens) panel by PCR     Status: None   Collection Time: 05/09/24 10:57 PM   Specimen: Nasopharyngeal Swab; Respiratory  Result Value Ref Range Status   Adenovirus NOT DETECTED NOT DETECTED Final   Coronavirus 229E NOT DETECTED NOT DETECTED Final    Comment: (NOTE) The Coronavirus on the Respiratory Panel, DOES NOT test for the novel  Coronavirus (2019 nCoV)    Coronavirus HKU1 NOT DETECTED NOT DETECTED Final   Coronavirus NL63 NOT DETECTED NOT DETECTED Final   Coronavirus OC43 NOT DETECTED NOT DETECTED Final   Metapneumovirus  NOT DETECTED NOT DETECTED Final   Rhinovirus / Enterovirus NOT DETECTED NOT DETECTED Final   Influenza A NOT DETECTED NOT DETECTED Final   Influenza B NOT DETECTED NOT DETECTED Final   Parainfluenza  Virus 1 NOT DETECTED NOT DETECTED Final   Parainfluenza Virus 2 NOT DETECTED NOT DETECTED Final   Parainfluenza Virus 3 NOT DETECTED NOT DETECTED Final   Parainfluenza Virus 4 NOT DETECTED NOT DETECTED Final   Respiratory Syncytial Virus NOT DETECTED NOT DETECTED Final   Bordetella pertussis NOT DETECTED NOT DETECTED Final   Bordetella Parapertussis NOT DETECTED NOT DETECTED Final   Chlamydophila pneumoniae NOT DETECTED NOT DETECTED Final   Mycoplasma pneumoniae NOT DETECTED NOT DETECTED Final    Comment: Performed at [facility], [address] [place], [place] [address]  MRSA Next Gen by PCR, Nasal     Status: None   Collection Time: 05/09/24 11:34 PM   Specimen: Nasal Mucosa; Nasal Swab  Result Value Ref Range Status   MRSA by PCR Next Gen NOT DETECTED NOT DETECTED Final    Comment: (NOTE) The GeneXpert MRSA Assay (FDA approved for NASAL specimens only), is one component of a comprehensive MRSA colonization surveillance program. It is not intended to diagnose MRSA infection nor to guide or monitor treatment for MRSA infections. Test performance is not FDA approved in patients less than 13 years old. Performed at [facility], [address] [place], [place] [address]          Radiology Studies: VAS US  UPPER EXTREMITY VENOUS DUPLEX Result Date: 05/16/2024 UPPER VENOUS STUDY  Patient Name:  Jeffrey Randall.  Date of Exam:   05/16/2024 Medical Rec #: [id number]            Accession #:    [id number] Date of Birth: 1940/05/10             Patient Gender: M Patient Age:   50 years Exam Location:  [facility] Procedure:      VAS US  UPPER EXTREMITY VENOUS DUPLEX Referring Phys: ANNALEE OREM --------------------------------------------------------------------------------  Indications: Pain, and Swelling Other Indications: Recent IV to RUE. Comparison Study: No previous exams Performing Technologist: Jody Hill RVT, RDMS  Examination Guidelines: A complete evaluation  includes B-mode imaging, spectral Doppler, color Doppler, and power Doppler as needed of all accessible portions of each vessel. Bilateral testing is considered an integral part of a complete examination. Limited examinations for reoccurring indications may be performed as noted.  Right Findings: +----------+------------+---------+-----------+----------+-------------------+ RIGHT     CompressiblePhasicitySpontaneousProperties      Summary       +----------+------------+---------+-----------+----------+-------------------+ IJV           Full       Yes       Yes                                  +----------+------------+---------+-----------+----------+-------------------+ Subclavian    Full       Yes       Yes                                  +----------+------------+---------+-----------+----------+-------------------+ Axillary      Full       Yes       Yes                                  +----------+------------+---------+-----------+----------+-------------------+  Brachial      Full       Yes       Yes                                  +----------+------------+---------+-----------+----------+-------------------+ Radial        None       No        No               Acute one of paired +----------+------------+---------+-----------+----------+-------------------+ Ulnar         Full                                                      +----------+------------+---------+-----------+----------+-------------------+ Cephalic      Full                                                      +----------+------------+---------+-----------+----------+-------------------+ Basilic       Full                                                      +----------+------------+---------+-----------+----------+-------------------+  Left Findings: +----------+------------+---------+-----------+----------+-------+ LEFT      CompressiblePhasicitySpontaneousPropertiesSummary  +----------+------------+---------+-----------+----------+-------+ Subclavian    Full       Yes       Yes                      +----------+------------+---------+-----------+----------+-------+  Summary:  Right: No evidence of superficial vein thrombosis in the upper extremity. Findings consistent with acute deep vein thrombosis involving one of the right radial veins.  Left: No evidence of thrombosis in the subclavian.  *See table(s) above for measurements and observations.     Preliminary         Scheduled Meds:  amLODipine   10 mg Oral Daily   apixaban   5 mg Oral BID   arformoterol   15 mcg Nebulization BID   budesonide  (PULMICORT ) nebulizer solution  0.5 mg Nebulization BID   carvedilol   12.5 mg Oral BID WC   finasteride   5 mg Oral Daily   guaiFENesin   600 mg Oral BID   insulin  aspart  0-6 Units Subcutaneous Q4H   insulin  glargine  10 Units Subcutaneous Daily   revefenacin   175 mcg Nebulization Daily   simvastatin   10 mg Oral QHS   sodium chloride  HYPERTONIC  4 mL Nebulization BID   tamsulosin   0.8 mg Oral QHS   Continuous Infusions:  cefTRIAXone  (ROCEPHIN )  IV 2 g (05/15/24 1809)   metronidazole  500 mg (05/16/24 1200)     LOS: 7 days   Time spent:   Elsie JAYSON [place], DO [facility]  If 7PM-7AM, please contact night-coverage [email or web address]  05/16/2024, 5:42 PM      "

## 2024-05-16 NOTE — Progress Notes (Signed)
 RUE venous duplex has been completed.  Preliminary results given to Dr. Lue.   Results can be found under chart review under CV PROC. 05/16/2024 12:02 PM Brittnie Lewey RVT, RDMS

## 2024-05-16 NOTE — Plan of Care (Signed)
   Problem: Skin Integrity: Goal: Risk for impaired skin integrity will decrease Outcome: Progressing

## 2024-05-16 NOTE — Op Note (Signed)
 Bronchoscopy Procedure Note  Hari Casaus  [id number]  Mar 22, 1941  Date:05/16/24  Time:2:33 PM   Provider Performing:Merranda Bolls B Keandra Medero   Procedure(s):  Flexible bronchoscopy with bronchial alveolar lavage (68375)  Indication(s) Cavitary PNeumonia  Consent Risks of the procedure as well as the alternatives and risks of each were explained to the patient and/or caregiver.  Consent for the procedure was obtained and is signed in the bedside chart  Anesthesia General   Time Out Verified patient identification, verified procedure, site/side was marked, verified correct patient position, special equipment/implants available, medications/allergies/relevant history reviewed, required imaging and test results available.   Sterile Technique Usual hand hygiene, masks, gowns, and gloves were used   Procedure Description Bronchoscope advanced through endotracheal tube and into airway.  Airways were examined down to subsegmental level with findings noted below.   Following diagnostic evaluation, BAL(s) performed in RML with normal saline and return of 35cc fluid  Findings:  - Normal appearing bronchial tree - thick purulent secretions noted in left lower and right lower lung. Suctioned to clarity   Complications/Tolerance None; patient tolerated the procedure well. Chest X-ray is not needed post procedure.   EBL Minimal   Specimen(s) RML BAL sent for cell count, respiratory culture, fungal culture and AFB smear/culture

## 2024-05-16 NOTE — Transfer of Care (Signed)
 Immediate Anesthesia Transfer of Care Note  Patient: Jeffrey Randall.  Procedure(s) Performed: BRONCHOSCOPY, FLEXIBLE (Bilateral) IRRIGATION, BRONCHUS  Patient Location: Endoscopy Unit  Anesthesia Type:General  Level of Consciousness: drowsy and patient cooperative  Airway & Oxygen  Therapy: Patient Spontanous Breathing and Patient connected to face mask oxygen   Post-op Assessment: Report given to RN and Post -op Vital signs reviewed and stable  Post vital signs: Reviewed and stable  Last Vitals:  Vitals Value Taken Time  BP 138/52  77 05/16/24  1449  Temp    Pulse 77 05/16/24  1449  Resp 16 05/16/24  1449  SpO2 92 05/16/24  1449    Last Pain:  Vitals:   05/16/24 1350  TempSrc: Temporal  PainSc:          Complications: No notable events documented.

## 2024-05-16 NOTE — Plan of Care (Signed)
" °  Problem: Coping: Goal: Ability to adjust to condition or change in health will improve Outcome: Progressing   Problem: Nutritional: Goal: Maintenance of adequate nutrition will improve Outcome: Progressing   Problem: Clinical Measurements: Goal: Will remain free from infection Outcome: Progressing   Problem: Clinical Measurements: Goal: Respiratory complications will improve Outcome: Not Progressing   "

## 2024-05-16 NOTE — Anesthesia Preprocedure Evaluation (Signed)
"                                    Anesthesia Evaluation  Patient identified by MRN, date of birth, ID band Patient awake    Reviewed: Allergy & Precautions, H&P , NPO status , Patient's Chart, lab work & pertinent test results  Airway Mallampati: II  TM Distance: >3 FB Neck ROM: Full    Dental no notable dental hx.    Pulmonary neg pulmonary ROS, pneumonia, COPD, Current Smoker and Patient abstained from smoking.   Pulmonary exam normal breath sounds clear to auscultation       Cardiovascular Exercise Tolerance: Good hypertension, negative cardio ROS Normal cardiovascular exam Rhythm:Regular Rate:Normal  ECHO 1/26   1. Left ventricular ejection fraction, by estimation, is 60 to 65%. The left ventricle has normal function. The left ventricle has no regional wall motion abnormalities. There is mild concentric left ventricular hypertrophy. Left ventricular diastolic parameters were normal.  2. Right ventricular systolic function is normal. The right ventricular size is normal.  3. The mitral valve is degenerative. Trivial mitral valve regurgitation. No evidence of mitral stenosis.  4. The aortic valve is tricuspid. Aortic valve regurgitation is not visualized. Aortic valve sclerosis/calcification is present, without any evidence of aortic stenosis.  5. The inferior vena cava is normal in size with greater than 50% respiratory variability, suggesting right atrial pressure of 3 mmHg.    Neuro/Psych  PSYCHIATRIC DISORDERS      negative neurological ROS  negative psych ROS   GI/Hepatic negative GI ROS, Neg liver ROS,,,  Endo/Other  negative endocrine ROSdiabetes    Renal/GU Renal diseasenegative Renal ROS  negative genitourinary   Musculoskeletal negative musculoskeletal ROS (+)    Abdominal   Peds negative pediatric ROS (+)  Hematology negative hematology ROS (+) Blood dyscrasia, anemia   Anesthesia Other Findings   Reproductive/Obstetrics negative OB  ROS                              Anesthesia Physical Anesthesia Plan  ASA: 3  Anesthesia Plan: General   Post-op Pain Management: Minimal or no pain anticipated   Induction: Intravenous  PONV Risk Score and Plan: 1 and Dexamethasone  and Treatment may vary due to age or medical condition  Airway Management Planned: Oral ETT  Additional Equipment:   Intra-op Plan:   Post-operative Plan:   Informed Consent: I have reviewed the patients History and Physical, chart, labs and discussed the procedure including the risks, benefits and alternatives for the proposed anesthesia with the patient or authorized representative who has indicated his/her understanding and acceptance.       Plan Discussed with: Anesthesiologist and CRNA  Anesthesia Plan Comments:          Anesthesia Quick Evaluation  "

## 2024-05-16 NOTE — Anesthesia Procedure Notes (Signed)
 Procedure Name: Intubation Date/Time: 05/16/2024 2:06 PM  Performed by: Claudene Arlin LABOR, CRNAPre-anesthesia Checklist: Patient identified, Emergency Drugs available, Suction available and Patient being monitored Patient Re-evaluated:Patient Re-evaluated prior to induction Oxygen  Delivery Method: Circle system utilized Preoxygenation: Pre-oxygenation with 100% oxygen  Induction Type: IV induction Ventilation: Mask ventilation without difficulty Laryngoscope Size: Miller and 2 Grade View: Grade I Tube type: Oral Tube size: 8.5 mm Number of attempts: 1 Airway Equipment and Method: Stylet Placement Confirmation: ETT inserted through vocal cords under direct vision, positive ETCO2 and breath sounds checked- equal and bilateral Secured at: 22 cm Tube secured with: Tape Dental Injury: Teeth and Oropharynx as per pre-operative assessment

## 2024-05-16 NOTE — Consult Note (Signed)
 "  NAME:  Jeffrey Londo., MRN:  [id number], DOB:  1940/11/28, LOS: 7 ADMISSION DATE:  05/09/2024, CONSULTATION DATE:  05/11/24 REFERRING MD:  [facility] CHIEF COMPLAINT:  Cavitary Lung Lesion   History of Present Illness:  Jeffrey Randall is an 84 year old male with COPD, NSCLC of right upper lobe s/p SBRT in 2022 who is admitted for necrotizing pneumonia. He presented with increasing cough, wheezing and dyspnea.   Patient recently admitted 1/14 to 1/16 for influenza A infection. He was seen in hospital follow up in pulmonary clinic on 1/22.   CT Chest scan 04/13/24 did not indicated a cavitary lesion but did note a mass like opacity measuring 2.9 x 2.4cm with surrounding architectural distortion, likely secondary to prior radiation. Left adrenal mass increasing in size since 2022, MRI recommended.  CT Chest 05/09/24 shows new focal areas of irregular consolidation with cavitation and surrounding infiltration, largest in right middle lung measuring 5.5cm, small leasion in the superior segment of left loer lobe measuring 1.3 x 2.8cm.   PCCM consulted for further evaluation. ID has been consulted.   Pertinent  Medical History   Past Medical History:  Diagnosis Date   Alcoholism in recovery [facility])    Aortic atherosclerosis    Back pain 06/17/2014   Chronic lung disease    CKD (chronic kidney disease)    COPD (chronic obstructive pulmonary disease) (HCC) 07/23/2014   Essential hypertension    Gout    History of lung cancer    History of pancreatitis    Iron deficiency anemia    Lumbar disc disease    Mixed hyperlipidemia    Notalgia    Prostate hypertrophy    Pulmonary nodules 07/23/2014   Swelling    Tobacco use disorder 07/23/2014   Tumor of lung 01/26/2021   Type 2 diabetes mellitus without complication    Uncontrolled type 2 diabetes mellitus with hyperglycemia, without long-term current use of insulin  (HCC)    Significant Hospital Events: Including procedures, antibiotic start and  stop dates in addition to other pertinent events   1/29 admitted 1/30 PCCM consulted  Interim History / Subjective:   Patient mildly improved in regards to dyspnea over the weekend He has started to cough up more mucous, usually in the morning times Discussed with family at bedside, ok to move forward with bronch.  Objective    Blood pressure 130/69, pulse 94, temperature 98.8 F (37.1 C), temperature source Oral, resp. rate 20, height 5' 9 (1.753 m), weight 66.3 kg, SpO2 100%.    FiO2 (%):  [40 %] 40 %   Intake/Output Summary (Last 24 hours) at 05/16/2024 0754 Last data filed at 05/15/2024 1600 Gross per 24 hour  Intake 75829 ml  Output --  Net 24170 ml   Filed Weights   05/09/24 1451 05/10/24 1854  Weight: 66 kg 66.3 kg    Examination: General: elderly male, no distress, laying in bed HENT: [place]/AT, moist mucous membranes Lungs: diminished, mild wheezing on right Cardiovascular: rrr, no murmurs Abdomen: soft, non-tender, non-distended Extremities: warm, no edema Neuro: alert, moving all extremities GU: n/a  Resolved problem list   Assessment and Plan   Acute on Chronic Hypoxemic Respiratory Failure COPD Cavitary Pneumonia Oropharyngeal Dysphagia Hx of NSCLC of right upper lung  Discussion: Patient with new consolidations and caviatary lesion of right middle lung which is new from 3-4 weeks ago on CT imaging. Given timeline, this is concerning for infectious etiology. Also given his oropharyngeal dysphagia and  recent influenza illness, there is concern for aspiration.   Plan: - TB work up per Infectious disease, requesting bronchoscopy - Will plan for bronchoscopy with BAL today, consent signed - Recommend dysphagia precauations - Agree with cefepime  and flagyl  for cavitary pneumonia coverage.  - Will follow sputum cultures to direct antibiotic therapy - continue budesonide , brovana  and yupelri  nebulizer treatments and PRN albuterol  - Recommend transitioning to  all nebulizer regimen as outpatient   Labs   CBC: Recent Labs  Lab 05/11/24 0612 05/11/24 2139 05/13/24 0125 05/14/24 0129 05/15/24 0319  WBC 11.1* 9.3 9.0 9.6 8.5  NEUTROABS  --  7.9*  --   --   --   HGB 7.1* 7.3* 8.0* 7.9* 7.4*  HCT 21.9* 23.1* 25.4* 25.3* 23.8*  MCV 94.4 96.3 95.5 95.8 96.4  PLT 318 351 358 364 342    Basic Metabolic Panel: Recent Labs  Lab 05/09/24 2208 05/10/24 0200 05/11/24 0612 05/11/24 2139 05/13/24 0125 05/14/24 0129 05/15/24 0319  NA  --  141 138 140 139 139 142  K  --  4.6 4.7 4.7 4.8 4.9 4.7  CL  --  109 107 110 111 107 112*  CO2  --  24 23 23  20* 23 23  GLUCOSE  --  146* 223* 136* 63* 156* 75  BUN  --  33* 48* 46* 43* 43* 41*  CREATININE  --  2.95* 3.05* 3.00* 2.93* 3.03* 2.86*  CALCIUM  --  8.6* 8.0* 8.2* 8.3* 8.4* 8.4*  MG 2.2 2.3  --   --   --   --   --   PHOS 3.0 3.1  --   --   --   --   --    GFR: Estimated Creatinine Clearance: 18 mL/min (A) (by C-G formula based on SCr of 2.86 mg/dL (H)). Recent Labs  Lab 05/09/24 2208 05/09/24 2219 05/10/24 0035 05/10/24 0200 05/11/24 2139 05/13/24 0125 05/14/24 0129 05/15/24 0319  PROCALCITON 0.20  --   --   --   --   --   --   --   WBC  --   --   --    < > 9.3 9.0 9.6 8.5  LATICACIDVEN  --  0.9 0.7  --   --   --   --   --    < > = values in this interval not displayed.    Liver Function Tests: Recent Labs  Lab 05/10/24 0200 05/11/24 2139 05/13/24 0125 05/14/24 0129 05/15/24 0319  AST 14* 14* 14* 14* 11*  ALT 14 15 13 14 14   ALKPHOS 116 113 116 125 117  BILITOT <0.2 <0.2 <0.2 <0.2 <0.2  PROT 5.9* 5.4* 5.6* 5.7* 5.4*  ALBUMIN  2.4* 2.3* 2.2* 2.4* 2.2*   No results for input(s): LIPASE, AMYLASE in the last 168 hours. Recent Labs  Lab 05/14/24 0129  AMMONIA 23    ABG    Component Value Date/Time   PHART 7.34 (L) 05/13/2024 2240   PCO2ART 48 05/13/2024 2240   PO2ART 86 05/13/2024 2240   HCO3 25.9 05/13/2024 2240   TCO2 32 04/24/2024 1122   ACIDBASEDEF 0.4  05/13/2024 2240   O2SAT 100 05/13/2024 2240     Coagulation Profile: No results for input(s): INR, PROTIME in the last 168 hours.  Cardiac Enzymes: Recent Labs  Lab 05/09/24 2208  CKTOTAL 65    HbA1C: Hgb A1c MFr Bld  Date/Time Value Ref Range Status  12/02/2023 03:29 AM 11.2 (H) 4.8 - 5.6 %  Final    Comment:    (NOTE) Diagnosis of Diabetes The following HbA1c ranges recommended by the American Diabetes Association (ADA) may be used as an aid in the diagnosis of diabetes mellitus.  Hemoglobin             Suggested A1C NGSP%              Diagnosis  <5.7                   Non Diabetic  5.7-6.4                Pre-Diabetic  >6.4                   Diabetic  <7.0                   Glycemic control for                       adults with diabetes.    06/29/2023 10:19 AM 9.9 (H) 4.8 - 5.6 % Final    Comment:    (NOTE) Pre diabetes:          5.7%-6.4%  Diabetes:              >6.4%  Glycemic control for   <7.0% adults with diabetes     CBG: Recent Labs  Lab 05/15/24 1120 05/15/24 1558 05/15/24 2047 05/15/24 2327 05/16/24 0501  GLUCAP 215* 251* 165* 126* 265*       Critical care time: n/a    Dorn Chill, MD [facility] Office: [phone]   See Amion for personal pager PCCM on call pager [phone] until 7pm. Please call Elink 7p-7a. [phone]          "

## 2024-05-17 ENCOUNTER — Telehealth: Payer: Self-pay | Admitting: Pulmonary Disease

## 2024-05-17 ENCOUNTER — Encounter (HOSPITAL_COMMUNITY): Payer: Self-pay | Admitting: Pulmonary Disease

## 2024-05-17 DIAGNOSIS — R1312 Dysphagia, oropharyngeal phase: Secondary | ICD-10-CM | POA: Diagnosis not present

## 2024-05-17 DIAGNOSIS — F17210 Nicotine dependence, cigarettes, uncomplicated: Secondary | ICD-10-CM

## 2024-05-17 DIAGNOSIS — J9621 Acute and chronic respiratory failure with hypoxia: Secondary | ICD-10-CM | POA: Diagnosis not present

## 2024-05-17 DIAGNOSIS — J189 Pneumonia, unspecified organism: Secondary | ICD-10-CM | POA: Diagnosis not present

## 2024-05-17 DIAGNOSIS — R911 Solitary pulmonary nodule: Secondary | ICD-10-CM

## 2024-05-17 DIAGNOSIS — J984 Other disorders of lung: Secondary | ICD-10-CM | POA: Diagnosis not present

## 2024-05-17 DIAGNOSIS — J449 Chronic obstructive pulmonary disease, unspecified: Secondary | ICD-10-CM | POA: Diagnosis not present

## 2024-05-17 LAB — CBC
HCT: 22.7 % — ABNORMAL LOW (ref 39.0–52.0)
Hemoglobin: 7 g/dL — ABNORMAL LOW (ref 13.0–17.0)
MCH: 29.7 pg (ref 26.0–34.0)
MCHC: 30.8 g/dL (ref 30.0–36.0)
MCV: 96.2 fL (ref 80.0–100.0)
Platelets: 377 10*3/uL (ref 150–400)
RBC: 2.36 MIL/uL — ABNORMAL LOW (ref 4.22–5.81)
RDW: 14.8 % (ref 11.5–15.5)
WBC: 5.2 10*3/uL (ref 4.0–10.5)
nRBC: 0 % (ref 0.0–0.2)

## 2024-05-17 LAB — GLUCOSE, CAPILLARY
Glucose-Capillary: 338 mg/dL — ABNORMAL HIGH (ref 70–99)
Glucose-Capillary: 384 mg/dL — ABNORMAL HIGH (ref 70–99)
Glucose-Capillary: 553 mg/dL (ref 70–99)
Glucose-Capillary: 512 mg/dL (ref 70–99)
Glucose-Capillary: 527 mg/dL (ref 70–99)
Glucose-Capillary: 297 mg/dL — ABNORMAL HIGH (ref 70–99)
Glucose-Capillary: 143 mg/dL — ABNORMAL HIGH (ref 70–99)

## 2024-05-17 LAB — BASIC METABOLIC PANEL WITH GFR
Anion gap: 7 (ref 5–15)
BUN: 42 mg/dL — ABNORMAL HIGH (ref 8–23)
CO2: 23 mmol/L (ref 22–32)
Calcium: 8.3 mg/dL — ABNORMAL LOW (ref 8.9–10.3)
Chloride: 110 mmol/L (ref 98–111)
Creatinine, Ser: 2.87 mg/dL — ABNORMAL HIGH (ref 0.61–1.24)
GFR, Estimated: 21 mL/min — ABNORMAL LOW
Glucose, Bld: 384 mg/dL — ABNORMAL HIGH (ref 70–99)
Potassium: 5.2 mmol/L — ABNORMAL HIGH (ref 3.5–5.1)
Sodium: 140 mmol/L (ref 135–145)

## 2024-05-17 LAB — ACID FAST SMEAR (AFB, MYCOBACTERIA): Acid Fast Smear: NEGATIVE

## 2024-05-17 LAB — GLUCOSE, RANDOM: Glucose, Bld: 370 mg/dL — ABNORMAL HIGH (ref 70–99)

## 2024-05-17 MED ORDER — INSULIN ASPART 100 UNIT/ML IJ SOLN
0.0000 [IU] | Freq: Three times a day (TID) | INTRAMUSCULAR | Status: AC
  Administered 2024-05-17: 11 [IU] via SUBCUTANEOUS
  Administered 2024-05-18 (×2): 7 [IU] via SUBCUTANEOUS
  Administered 2024-05-18: 4 [IU] via SUBCUTANEOUS
  Filled 2024-05-17: qty 9
  Filled 2024-05-17 (×2): qty 7
  Filled 2024-05-17: qty 4

## 2024-05-17 MED ORDER — INSULIN ASPART 100 UNIT/ML IJ SOLN
20.0000 [IU] | Freq: Once | INTRAMUSCULAR | Status: AC
  Administered 2024-05-17: 20 [IU] via SUBCUTANEOUS
  Filled 2024-05-17: qty 20

## 2024-05-17 MED ORDER — SODIUM ZIRCONIUM CYCLOSILICATE 5 G PO PACK
5.0000 g | PACK | Freq: Once | ORAL | Status: AC
  Administered 2024-05-17: 5 g via ORAL
  Filled 2024-05-17: qty 1

## 2024-05-17 NOTE — Inpatient Diabetes Management (Signed)
 Inpatient Diabetes Program Recommendations  AACE/ADA: New Consensus Statement on Inpatient Glycemic Control (2015)  Target Ranges:  Prepandial:   less than 140 mg/dL      Peak postprandial:   less than 180 mg/dL (1-2 hours)      Critically ill patients:  140 - 180 mg/dL   Lab Results  Component Value Date   GLUCAP 553 (HH) 05/17/2024   HGBA1C 11.2 (H) 12/02/2023    Review of Glycemic Control  Latest Reference Range & Units 05/16/24 20:07 05/16/24 23:40 05/17/24 02:53 05/17/24 08:08 05/17/24 12:32  Glucose-Capillary 70 - 99 mg/dL 677 (H) 680 (H) 661 (H) 384 (H) 553 (HH)  (HH): Data is critically high (H): Data is abnormally high  Diabetes history: DM2 Outpatient Diabetes medications:  Tresiba  20 units every day Fraxiga 10 mg QD Current orders for Inpatient glycemic control:  Semglee  10 units every day Novolog  0-6 units Q4H Had decadron  yesterday-5 mg  Inpatient Diabetes Program Recommendations:    Please consider:  Novolog  0-9 units Q4H Novolog  3 units TID with meals   Thank you, Wyvonna Pinal, MSN, CDCES Diabetes Coordinator Inpatient Diabetes Program [phone] (team pager from 8a-5p)

## 2024-05-17 NOTE — Progress Notes (Signed)
 " PROGRESS NOTE    Jeffrey JULIANNA Rolan Randall.  FMW:[id number] DOB: 12-22-1940 DOA: 05/09/2024 PCP: Sheldon Netter, PA   Brief Narrative:  84 y.o. male with medical history significant of   COPD, CKD 3b, HTN, DM2, hx. lung cancer s/p radiation, gout, chronic normocytic anemia, history of pulmonary embolism in 2024 on Eliquis  who presented with worsening shortness of breath cough and found to have mild hypoxia, imaging concerning for cavitary pneumonia.  Hospitalist called for admission, ID and pulmonology also consulted for assistance.  Assessment & Plan:   Principal Problem:   Cavitary pneumonia Active Problems:   Essential hypertension   Insulin  dependent type 2 diabetes mellitus (HCC)   COPD (chronic obstructive pulmonary disease) (HCC)   Acute respiratory failure with hypoxia (HCC)   BPH (benign prostatic hyperplasia)   History of BPH   Pulmonary embolism (HCC)  Acute on chronic respiratory failure with hypoxia (HCC) Chronically on 2 L Continue to titrate O2 as needed (most recently requiring 4-5L [place]) Increased oxygen  requirement in the setting of necrotizing pneumonia with cavitary lesion   Cavitary pneumonia Rule out atypical infection - ID consulted and following - Imaging concerning for right upper lobe consolidation with cavitation and surrounding infiltration bilaterally - possible necrotizing/inflammatory etiology - Pulmonology consulted for BAL, performed 2/4 without complication - Continue cefepime , Flagyl  (MRSA negative, vancomycin  discontinued) - Initial QuantiFERON is indeterminate, continue airborne precautions with negative testing pending -risk for TB is low, no clear risk factors  Rule out acute diastolic CHF - BNP elevated - Diuresed well after giving Lasix  60 mg IV x 1 - Will hold further diuresis at this time as patient appears dry   Hyperkalemia - Mild, Lokelma  x 1 -repeat labs pending  COPD (chronic obstructive pulmonary disease) (HCC) - Continue home  medications(currently on nebulizer equivalent of trelegy), not in acute exacerbation - Recently completed prednisone  taper   Essential hypertension Continue Amlodipine  10 mg a day  Carvedilol  12.5 mg twice daily   BPH (benign prostatic hyperplasia) Continue Flomax , finasteride    CKD stage IIIb - Creatinine at baseline   Insulin  dependent type 2 diabetes mellitus (HCC), uncontrolled with hyperglycemia Continue sliding scale insulin  NovoLog  Continue Lantus  10 units subcu daily P.o. intake increasing over the past 24 hours, glucose markedly elevated today, increased sliding scale to resistant will follow clinically    Pulmonary embolism (HCC) RUE DVT, radial, POA -D-dimer elevated 2.80; continue Eliquis  2 times daily -Venous duplex of right upper extremity positive for radial DVT - already on anticoagulation   Normocytic anemia -Chronic likely anemia of  chronic disease -Anemia panel showed ferritin 477, serum iron 19, TIBC 162, saturation 12% -B12 3994, folate 18.1 - Hemoglobin stable between 7-8 although mildly downtrending over the past 48h - Continue to monitor H&H intermittently - Transfuse for hemoglobin less than 7  DVT prophylaxis: apixaban  (ELIQUIS ) tablet 5 mg  Code Status:   Code Status: Full Code Family Communication: Daughter at bedside  Status is: Inpatient  Dispo: The patient is from: Home              Anticipated d/c is to: Home              Anticipated d/c date is: TBD              Patient currently not medically stable for discharge  Consultants:  ID, PCCM  Procedures:  BAL 2/4  Antimicrobials:  Metronidazole /ceftriaxone    Subjective: No acute issues/events overnight - ongoing shortness of breath  Objective: Vitals:  05/16/24 2009 05/16/24 2100 05/16/24 2341 05/17/24 0255  BP: 111/89  (!) 123/48 (!) 141/60  Pulse: 68  (!) 58 74  Resp: (!) 23  (!) 21 (!) 21  Temp: 97.8 F (36.6 C)  97.9 F (36.6 C) 98.2 F (36.8 C)  TempSrc:   Oral    SpO2:  91% 100% 94%  Weight:      Height:        Intake/Output Summary (Last 24 hours) at 05/17/2024 0740 Last data filed at 05/16/2024 1827 Gross per 24 hour  Intake 420 ml  Output --  Net 420 ml   Filed Weights   05/09/24 1451 05/10/24 1854 05/16/24 1350  Weight: 66 kg 66.3 kg 66.3 kg    Examination:  General:  Pleasantly resting in bed, No acute distress. HEENT:  Normocephalic atraumatic.  Sclerae nonicteric, noninjected.  Extraocular movements intact bilaterally. Neck:  Without mass or deformity.  Trachea is midline. Lungs:  diminished R>L, no overt wheeze or rales Heart:  Regular rate and rhythm.  Without murmurs, rubs, or gallops. Abdomen:  Soft, nontender, nondistended.  Without guarding or rebound. Extremities: Without cyanosis, clubbing, edema, or obvious deformity. Skin:  Warm and dry, no erythema.  Data Reviewed: I have personally reviewed following labs and imaging studies  CBC: Recent Labs  Lab 05/11/24 2139 05/13/24 0125 05/14/24 0129 05/15/24 0319 05/17/24 0257  WBC 9.3 9.0 9.6 8.5 5.2  NEUTROABS 7.9*  --   --   --   --   HGB 7.3* 8.0* 7.9* 7.4* 7.0*  HCT 23.1* 25.4* 25.3* 23.8* 22.7*  MCV 96.3 95.5 95.8 96.4 96.2  PLT 351 358 364 342 377   Basic Metabolic Panel: Recent Labs  Lab 05/11/24 2139 05/13/24 0125 05/14/24 0129 05/15/24 0319 05/17/24 0257  NA 140 139 139 142 140  K 4.7 4.8 4.9 4.7 5.2*  CL 110 111 107 112* 110  CO2 23 20* 23 23 23   GLUCOSE 136* 63* 156* 75 384*  BUN 46* 43* 43* 41* 42*  CREATININE 3.00* 2.93* 3.03* 2.86* 2.87*  CALCIUM 8.2* 8.3* 8.4* 8.4* 8.3*   GFR: Estimated Creatinine Clearance: 18 mL/min (A) (by C-G formula based on SCr of 2.87 mg/dL (H)). Liver Function Tests: Recent Labs  Lab 05/11/24 2139 05/13/24 0125 05/14/24 0129 05/15/24 0319  AST 14* 14* 14* 11*  ALT 15 13 14 14   ALKPHOS 113 116 125 117  BILITOT <0.2 <0.2 <0.2 <0.2  PROT 5.4* 5.6* 5.7* 5.4*  ALBUMIN  2.3* 2.2* 2.4* 2.2*   No results for  input(s): LIPASE, AMYLASE in the last 168 hours. Recent Labs  Lab 05/14/24 0129  AMMONIA 23   Coagulation Profile: No results for input(s): INR, PROTIME in the last 168 hours. Cardiac Enzymes: No results for input(s): CKTOTAL, CKMB, CKMBINDEX, TROPONINI in the last 168 hours.  BNP (last 3 results) Recent Labs    04/13/24 1518 04/24/24 1104 05/11/24 2139  PROBNP 3,516.0* 1,427.0* 2,066.0*   HbA1C: No results for input(s): HGBA1C in the last 72 hours. CBG: Recent Labs  Lab 05/16/24 1201 05/16/24 1540 05/16/24 2007 05/16/24 2340 05/17/24 0253  GLUCAP 234* 239* 322* 319* 338*   Lipid Profile: No results for input(s): CHOL, HDL, LDLCALC, TRIG, CHOLHDL, LDLDIRECT in the last 72 hours. Thyroid  Function Tests: No results for input(s): TSH, T4TOTAL, FREET4, T3FREE, THYROIDAB in the last 72 hours. Anemia Panel: No results for input(s): VITAMINB12, FOLATE, FERRITIN, TIBC, IRON, RETICCTPCT in the last 72 hours. Sepsis Labs: No results for input(s): PROCALCITON, LATICACIDVEN  in the last 168 hours.   Recent Results (from the past 240 hours)  Culture, blood (routine x 2)     Status: None   Collection Time: 05/09/24 10:06 PM   Specimen: BLOOD  Result Value Ref Range Status   Specimen Description BLOOD SITE NOT SPECIFIED  Final   Special Requests   Final    BOTTLES DRAWN AEROBIC AND ANAEROBIC Blood Culture adequate volume   Culture   Final    NO GROWTH 5 DAYS Performed at [facility], [address], [place], [place] [address]    Report Status 05/14/2024 FINAL  Final  Culture, blood (routine x 2)     Status: None   Collection Time: 05/09/24 10:06 PM   Specimen: BLOOD  Result Value Ref Range Status   Specimen Description BLOOD SITE NOT SPECIFIED  Final   Special Requests   Final    BOTTLES DRAWN AEROBIC AND ANAEROBIC Blood Culture adequate volume   Culture   Final    NO GROWTH 5 DAYS Performed at [facility], [address], [place], [place] [address]    Report Status 05/14/2024 FINAL  Final  Expectorated Sputum Assessment w Gram Stain, Rflx to Resp Cult     Status: None   Collection Time: 05/09/24 10:52 PM   Specimen: Expectorated Sputum  Result Value Ref Range Status   Specimen Description EXPECTORATED SPUTUM  Final   Special Requests Immunocompromised  Final   Sputum evaluation   Final    THIS SPECIMEN IS ACCEPTABLE FOR SPUTUM CULTURE Performed at [facility], [address], [place], [place] [address]    Report Status 05/12/2024 FINAL  Final  Culture, Respiratory w Gram Stain     Status: None   Collection Time: 05/09/24 10:52 PM  Result Value Ref Range Status   Specimen Description EXPECTORATED SPUTUM  Final   Special Requests Immunocompromised Reflexed from [id number]  Final   Gram Stain   Final    RARE WBC PRESENT, PREDOMINANTLY PMN MODERATE GRAM POSITIVE COCCI IN CLUSTERS FEW YEAST FEW GRAM NEGATIVE RODS RARE GRAM POSITIVE RODS    Culture   Final    FEW Normal respiratory flora-no Staph aureus or Pseudomonas seen Performed at [facility], [address] [place], [place] [address]    Report Status 05/14/2024 FINAL  Final  Respiratory (~20 pathogens) panel by PCR     Status: None   Collection Time: 05/09/24 10:57 PM   Specimen: Nasopharyngeal Swab; Respiratory  Result Value Ref Range Status   Adenovirus NOT DETECTED NOT DETECTED Final   Coronavirus 229E NOT DETECTED NOT DETECTED Final    Comment: (NOTE) The Coronavirus on the Respiratory Panel, DOES NOT test for the novel  Coronavirus (2019 nCoV)    Coronavirus HKU1 NOT DETECTED NOT DETECTED Final   Coronavirus NL63 NOT DETECTED NOT DETECTED Final   Coronavirus OC43 NOT DETECTED NOT DETECTED Final   Metapneumovirus NOT DETECTED NOT DETECTED Final   Rhinovirus / Enterovirus NOT DETECTED NOT DETECTED Final   Influenza A NOT DETECTED NOT DETECTED Final   Influenza B NOT DETECTED NOT DETECTED Final    Parainfluenza Virus 1 NOT DETECTED NOT DETECTED Final   Parainfluenza Virus 2 NOT DETECTED NOT DETECTED Final   Parainfluenza Virus 3 NOT DETECTED NOT DETECTED Final   Parainfluenza Virus 4 NOT DETECTED NOT DETECTED Final   Respiratory Syncytial Virus NOT DETECTED NOT DETECTED Final   Bordetella pertussis NOT DETECTED NOT DETECTED Final  Bordetella Parapertussis NOT DETECTED NOT DETECTED Final   Chlamydophila pneumoniae NOT DETECTED NOT DETECTED Final   Mycoplasma pneumoniae NOT DETECTED NOT DETECTED Final    Comment: Performed at [facility], [address] [place], [place] [address]  MRSA Next Gen by PCR, Nasal     Status: None   Collection Time: 05/09/24 11:34 PM   Specimen: Nasal Mucosa; Nasal Swab  Result Value Ref Range Status   MRSA by PCR Next Gen NOT DETECTED NOT DETECTED Final    Comment: (NOTE) The GeneXpert MRSA Assay (FDA approved for NASAL specimens only), is one component of a comprehensive MRSA colonization surveillance program. It is not intended to diagnose MRSA infection nor to guide or monitor treatment for MRSA infections. Test performance is not FDA approved in patients less than 60 years old. Performed at [facility] Lab, [address] [place], [place] [address]   Culture, BAL-quantitative w Gram Stain     Status: None (Preliminary result)   Collection Time: 05/16/24  2:19 PM   Specimen: Bronchial Alveolar Lavage; Respiratory  Result Value Ref Range Status   Specimen Description BRONCHIAL ALVEOLAR LAVAGE  Final   Special Requests RML  Final   Gram Stain   Final    FEW WBC PRESENT, PREDOMINANTLY PMN NO ORGANISMS SEEN Performed at [facility], [address], [place], [place] [address]    Culture PENDING  Incomplete   Report Status PENDING  Incomplete         Radiology Studies: VAS US  UPPER EXTREMITY VENOUS DUPLEX Result Date: 05/16/2024 UPPER VENOUS STUDY  Patient Name:  Jeffrey Randall.  Date of Exam:   05/16/2024 Medical Rec #:  [id number]            Accession #:    [id number] Date of Birth: 1940-08-29             Patient Gender: M Patient Age:   77 years Exam Location:  [facility] Procedure:      VAS US  UPPER EXTREMITY VENOUS DUPLEX Referring Phys: ANNALEE OREM --------------------------------------------------------------------------------  Indications: Pain, and Swelling Other Indications: Recent IV to RUE. Comparison Study: No previous exams Performing Technologist: Jody Hill RVT, RDMS  Examination Guidelines: A complete evaluation includes B-mode imaging, spectral Doppler, color Doppler, and power Doppler as needed of all accessible portions of each vessel. Bilateral testing is considered an integral part of a complete examination. Limited examinations for reoccurring indications may be performed as noted.  Right Findings: +----------+------------+---------+-----------+----------+-------------------+ RIGHT     CompressiblePhasicitySpontaneousProperties      Summary       +----------+------------+---------+-----------+----------+-------------------+ IJV           Full       Yes       Yes                                  +----------+------------+---------+-----------+----------+-------------------+ Subclavian    Full       Yes       Yes                                  +----------+------------+---------+-----------+----------+-------------------+ Axillary      Full       Yes       Yes                                  +----------+------------+---------+-----------+----------+-------------------+  Brachial      Full       Yes       Yes                                  +----------+------------+---------+-----------+----------+-------------------+ Radial        None       No        No               Acute one of paired +----------+------------+---------+-----------+----------+-------------------+ Ulnar         Full                                                       +----------+------------+---------+-----------+----------+-------------------+ Cephalic      Full                                                      +----------+------------+---------+-----------+----------+-------------------+ Basilic       Full                                                      +----------+------------+---------+-----------+----------+-------------------+  Left Findings: +----------+------------+---------+-----------+----------+-------+ LEFT      CompressiblePhasicitySpontaneousPropertiesSummary +----------+------------+---------+-----------+----------+-------+ Subclavian    Full       Yes       Yes                      +----------+------------+---------+-----------+----------+-------+  Summary:  Right: No evidence of superficial vein thrombosis in the upper extremity. Findings consistent with acute deep vein thrombosis involving one of the right radial veins.  Left: No evidence of thrombosis in the subclavian.  *See table(s) above for measurements and observations.  Diagnosing physician: Lonni Gaskins MD Electronically signed by Lonni Gaskins MD on 05/16/2024 at 6:36:24 PM.    Final         Scheduled Meds:  amLODipine   10 mg Oral Daily   apixaban   5 mg Oral BID   arformoterol   15 mcg Nebulization BID   budesonide  (PULMICORT ) nebulizer solution  0.5 mg Nebulization BID   carvedilol   12.5 mg Oral BID WC   finasteride   5 mg Oral Daily   guaiFENesin   600 mg Oral BID   insulin  aspart  0-6 Units Subcutaneous Q4H   insulin  glargine  10 Units Subcutaneous Daily   revefenacin   175 mcg Nebulization Daily   simvastatin   10 mg Oral QHS   sodium chloride  HYPERTONIC  4 mL Nebulization BID   tamsulosin   0.8 mg Oral QHS   Continuous Infusions:  cefTRIAXone  (ROCEPHIN )  IV 2 g (05/16/24 1826)   metronidazole  500 mg (05/17/24 0015)     LOS: 8 days   Time spent:   Jeffrey JAYSON Mont[place], DO [facility]  If 7PM-7AM, please contact  night-coverage [email or web address]  05/17/2024, 7:40 AM      "

## 2024-05-17 NOTE — Consult Note (Signed)
 "  NAME:  Jeffrey Mapps., MRN:  [id number], DOB:  1940-11-04, LOS: 8 ADMISSION DATE:  05/09/2024, CONSULTATION DATE:  05/11/24 REFERRING MD:  [facility] CHIEF COMPLAINT:  Cavitary Lung Lesion   History of Present Illness:  Jeffrey Randall is an 84 year old male with COPD, NSCLC of right upper lobe s/p SBRT in 2022 who is admitted for necrotizing pneumonia. He presented with increasing cough, wheezing and dyspnea.   Patient recently admitted 1/14 to 1/16 for influenza A infection. He was seen in hospital follow up in pulmonary clinic on 1/22.   CT Chest scan 04/13/24 did not indicated a cavitary lesion but did note a mass like opacity measuring 2.9 x 2.4cm with surrounding architectural distortion, likely secondary to prior radiation. Left adrenal mass increasing in size since 2022, MRI recommended.  CT Chest 05/09/24 shows new focal areas of irregular consolidation with cavitation and surrounding infiltration, largest in right middle lung measuring 5.5cm, small leasion in the superior segment of left loer lobe measuring 1.3 x 2.8cm.   PCCM consulted for further evaluation. ID has been consulted.   Pertinent  Medical History   Past Medical History:  Diagnosis Date   Alcoholism in recovery [facility])    Aortic atherosclerosis    Back pain 06/17/2014   Chronic lung disease    CKD (chronic kidney disease)    COPD (chronic obstructive pulmonary disease) (HCC) 07/23/2014   Essential hypertension    Gout    History of lung cancer    History of pancreatitis    Iron deficiency anemia    Lumbar disc disease    Mixed hyperlipidemia    Notalgia    Prostate hypertrophy    Pulmonary nodules 07/23/2014   Swelling    Tobacco use disorder 07/23/2014   Tumor of lung 01/26/2021   Type 2 diabetes mellitus without complication    Uncontrolled type 2 diabetes mellitus with hyperglycemia, without long-term current use of insulin  (HCC)    Significant Hospital Events: Including procedures, antibiotic start and  stop dates in addition to other pertinent events   1/29 admitted 1/30 PCCM consulted 2/5 s/p bronchosopy  Interim History / Subjective:   Patient feeling better today Less cough and no mucous production Daughter at bedside  Objective    Blood pressure (!) 141/60, pulse 74, temperature 98.2 F (36.8 C), resp. rate (!) 21, height 5' 9 (1.753 m), weight 66.3 kg, SpO2 94%.    FiO2 (%):  [40 %] 40 %   Intake/Output Summary (Last 24 hours) at 05/17/2024 0753 Last data filed at 05/16/2024 1827 Gross per 24 hour  Intake 420 ml  Output --  Net 420 ml   Filed Weights   05/09/24 1451 05/10/24 1854 05/16/24 1350  Weight: 66 kg 66.3 kg 66.3 kg    Examination: General: elderly male, no distress, laying in bed HENT: [place]/AT, moist mucous membranes Lungs: diminished, mild wheezing on right Cardiovascular: rrr, no murmurs Abdomen: soft, non-tender, non-distended Extremities: warm, no edema Neuro: alert, moving all extremities GU: n/a  Resolved problem list   Assessment and Plan   Acute on Chronic Hypoxemic Respiratory Failure COPD Cavitary Pneumonia Oropharyngeal Dysphagia Hx of NSCLC of right upper lung  Discussion: Patient with new consolidations and caviatary lesion of right middle lung which is new from 3-4 weeks ago on CT imaging. Given timeline, this is concerning for infectious etiology. Also given his oropharyngeal dysphagia and recent influenza illness, there is concern for aspiration.   Plan: - TB work up  per Infectious disease, s/p bronchoscopy 2/4 - Pending BAL cultures and expectorated sputum samples - Recommend dysphagia precauations - Agree with ceftriaxone  and flagyl  for cavitary pneumonia coverage.  - Can discharge on cefpodoxime and flagyl  for total of 4 weeks of therapy total - Will follow sputum cultures to direct antibiotic therapy - continue budesonide , brovana  and yupelri  nebulizer treatments and PRN albuterol  - Recommend transitioning to all nebulizer  regimen as outpatient: either budesonide  twice daily plus duonebs q6 or 8 hrs vs budesonide  + brovana  + yupelri   Will arrange hospital follow up in clinic in 2 weeks. PCCM will sign off.   Labs   CBC: Recent Labs  Lab 05/11/24 2139 05/13/24 0125 05/14/24 0129 05/15/24 0319 05/17/24 0257  WBC 9.3 9.0 9.6 8.5 5.2  NEUTROABS 7.9*  --   --   --   --   HGB 7.3* 8.0* 7.9* 7.4* 7.0*  HCT 23.1* 25.4* 25.3* 23.8* 22.7*  MCV 96.3 95.5 95.8 96.4 96.2  PLT 351 358 364 342 377    Basic Metabolic Panel: Recent Labs  Lab 05/11/24 2139 05/13/24 0125 05/14/24 0129 05/15/24 0319 05/17/24 0257  NA 140 139 139 142 140  K 4.7 4.8 4.9 4.7 5.2*  CL 110 111 107 112* 110  CO2 23 20* 23 23 23   GLUCOSE 136* 63* 156* 75 384*  BUN 46* 43* 43* 41* 42*  CREATININE 3.00* 2.93* 3.03* 2.86* 2.87*  CALCIUM 8.2* 8.3* 8.4* 8.4* 8.3*   GFR: Estimated Creatinine Clearance: 18 mL/min (A) (by C-G formula based on SCr of 2.87 mg/dL (H)). Recent Labs  Lab 05/13/24 0125 05/14/24 0129 05/15/24 0319 05/17/24 0257  WBC 9.0 9.6 8.5 5.2    Liver Function Tests: Recent Labs  Lab 05/11/24 2139 05/13/24 0125 05/14/24 0129 05/15/24 0319  AST 14* 14* 14* 11*  ALT 15 13 14 14   ALKPHOS 113 116 125 117  BILITOT <0.2 <0.2 <0.2 <0.2  PROT 5.4* 5.6* 5.7* 5.4*  ALBUMIN  2.3* 2.2* 2.4* 2.2*   No results for input(s): LIPASE, AMYLASE in the last 168 hours. Recent Labs  Lab 05/14/24 0129  AMMONIA 23    ABG    Component Value Date/Time   PHART 7.34 (L) 05/13/2024 2240   PCO2ART 48 05/13/2024 2240   PO2ART 86 05/13/2024 2240   HCO3 25.9 05/13/2024 2240   TCO2 32 04/24/2024 1122   ACIDBASEDEF 0.4 05/13/2024 2240   O2SAT 100 05/13/2024 2240     Coagulation Profile: No results for input(s): INR, PROTIME in the last 168 hours.  Cardiac Enzymes: No results for input(s): CKTOTAL, CKMB, CKMBINDEX, TROPONINI in the last 168 hours.   HbA1C: Hgb A1c MFr Bld  Date/Time Value Ref Range  Status  12/02/2023 03:29 AM 11.2 (H) 4.8 - 5.6 % Final    Comment:    (NOTE) Diagnosis of Diabetes The following HbA1c ranges recommended by the American Diabetes Association (ADA) may be used as an aid in the diagnosis of diabetes mellitus.  Hemoglobin             Suggested A1C NGSP%              Diagnosis  <5.7                   Non Diabetic  5.7-6.4                Pre-Diabetic  >6.4  Diabetic  <7.0                   Glycemic control for                       adults with diabetes.    06/29/2023 10:19 AM 9.9 (H) 4.8 - 5.6 % Final    Comment:    (NOTE) Pre diabetes:          5.7%-6.4%  Diabetes:              >6.4%  Glycemic control for   <7.0% adults with diabetes     CBG: Recent Labs  Lab 05/16/24 1201 05/16/24 1540 05/16/24 2007 05/16/24 2340 05/17/24 0253  GLUCAP 234* 239* 322* 319* 338*       Critical care time: n/a    Dorn Chill, MD [facility] Office: [phone]   See Amion for personal pager PCCM on call pager [phone] until 7pm. Please call Elink 7p-7a. [phone]          "

## 2024-05-17 NOTE — Telephone Encounter (Signed)
 Please schedule patient for hospital follow up with Dr. Theophilus or APP in 2 weeks.  Thanks, JD

## 2024-05-17 NOTE — Progress Notes (Signed)
 "                                                            [facility] Infectious Diseases Follow Up Note  Patient Identification: Patient Name: Jeffrey Randall. MRN: [id number] Admit Date: 05/09/2024  2:48 PM Age: 84 y.o.Today's Date: 05/17/2024  Reason for Visit: Cavitary pneumonia   Principal Problem:   Cavitary pneumonia Active Problems:   Essential hypertension   Insulin  dependent type 2 diabetes mellitus (HCC)   COPD (chronic obstructive pulmonary disease) (HCC)   Acute respiratory failure with hypoxia (HCC)   BPH (benign prostatic hyperplasia)   History of BPH   Pulmonary embolism (HCC)  Antibiotics:  Azithromycin  1/28 Cefepime  1/28-2/1, Ceftriaxone  2/2- Metronidazole  1/28- Vancomycin  1/28   Lines/Hardware:  Interval Events: Remains afebrile. 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  Cough productive of white sputum.   # Cavitary pneumonia in the right middle lung with other smaller lesions in superior segment of left lower lobe - Wide differentials: Cavitary pneumonia due to common bacterial pathogens like Staph aureus, Streptococcus, anaerobes, aspiration pneumonia, Possible aspiration PNA, Tuberculosis (no high risk factors), NTM lung disease, septic emboli, lung cancer.  Fungal infections seems less likely - TTE with no vegetations - 1/29 QuantiFERON indeterminate - 1/30 HIV negative  # Right radial vein DVT  # Penicillin allergy -reports history of swelling in his early 59s.  Does not remember details but needed to be hospitalized  # CKD- monitor Cr  # NSCLC of right upper lung with  negative EBUS biopsy treated with SBRT in 2022   # Significant weight loss  Recommendations - Fu sputum AFB smear and culture, BAL cultures as well as MTB rif - Continue ceftriaxone  and metronidazole  - Needs follow-up with allergist outpatient - Airborne precautions Following  Rest of the management as per the primary team. Thank you for the consult. Please page with pertinent questions or concerns.  ______________________________________________________________________ Subjective patient seen and examined at the bedside.  Daughter at bedside.  Daughter reports today is the best he has seen him since hospitalized.   Vitals BP (!) 123/45 (BP Location: Right Arm)   Pulse 64   Temp 98.2 F (36.8 C) (Oral)   Resp (!) 23   Ht 5' 9 (1.753 m)   Wt 66.3 kg   SpO2 100%   BMI 21.58 kg/m     Physical Exam Constitutional:  awake, alert, non toxic appearing    Comments: HEENT wnl   Cardiovascular:     Rate and Rhythm: Normal rate    Heart sounds:   Pulmonary:     Effort: Pulmonary effort is normal on nasal cannula    Comments:   Abdominal:     Palpations: Abdomen is non distended     Tenderness:   Musculoskeletal:        General: No swelling or tenderness in peripheral joints  Rt forearm swelling and tenderness at PIV site, improving   Skin:    Comments: No rashes   Neurological:     General: awake, alert and oriented  Psychiatric:        Mood and Affect: Mood normal.   Pertinent Microbiology Results for orders placed or performed during the hospital encounter of 05/09/24  Culture, blood (routine x 2)     Status: None   Collection Time: 05/09/24 10:06 PM   Specimen: BLOOD  Result Value Ref Range Status   Specimen Description BLOOD SITE NOT SPECIFIED  Final   Special Requests   Final    BOTTLES DRAWN AEROBIC AND ANAEROBIC Blood Culture adequate volume   Culture   Final    NO GROWTH 5 DAYS Performed at [facility], [address], [place],  [place] [address]    Report Status 05/14/2024 FINAL  Final  Culture, blood (routine x 2)     Status: None   Collection Time: 05/09/24 10:06 PM   Specimen: BLOOD  Result Value Ref Range Status   Specimen Description BLOOD SITE NOT SPECIFIED  Final   Special Requests   Final    BOTTLES DRAWN AEROBIC AND ANAEROBIC Blood Culture adequate volume   Culture   Final    NO GROWTH 5 DAYS Performed at [facility], [address], [place], [place] [address]    Report Status 05/14/2024 FINAL  Final  Expectorated Sputum Assessment w Gram Stain, Rflx to Resp Cult     Status: None   Collection Time: 05/09/24 10:52 PM   Specimen: Expectorated Sputum  Result Value Ref Range Status   Specimen Description EXPECTORATED SPUTUM  Final   Special Requests Immunocompromised  Final   Sputum evaluation   Final    THIS SPECIMEN IS ACCEPTABLE FOR SPUTUM CULTURE Performed at [facility], [address] [place], [place] [address]    Report Status 05/12/2024 FINAL  Final  Culture, Respiratory w Gram Stain     Status: None   Collection Time: 05/09/24 10:52 PM  Result Value Ref Range Status   Specimen Description EXPECTORATED SPUTUM  Final   Special Requests Immunocompromised Reflexed from [id number]  Final   Gram Stain   Final    RARE WBC PRESENT, PREDOMINANTLY PMN MODERATE GRAM POSITIVE COCCI IN CLUSTERS FEW YEAST FEW GRAM NEGATIVE RODS RARE GRAM POSITIVE RODS    Culture   Final    FEW Normal respiratory flora-no Staph aureus or Pseudomonas seen Performed at [facility], [address] [place], [place] [address]    Report Status 05/14/2024 FINAL  Final  Respiratory (~20 pathogens) panel by PCR     Status: None   Collection Time: 05/09/24 10:57 PM   Specimen: Nasopharyngeal Swab; Respiratory  Result Value Ref Range Status   Adenovirus NOT DETECTED NOT DETECTED Final   Coronavirus 229E NOT DETECTED NOT DETECTED Final    Comment: (NOTE) The Coronavirus on the Respiratory Panel, DOES NOT test  for the novel  Coronavirus (2019 nCoV)    Coronavirus HKU1 NOT DETECTED NOT DETECTED Final   Coronavirus NL63 NOT DETECTED NOT DETECTED Final   Coronavirus OC43 NOT DETECTED NOT DETECTED Final   Metapneumovirus NOT DETECTED NOT DETECTED Final   Rhinovirus / Enterovirus NOT DETECTED NOT DETECTED Final   Influenza A NOT DETECTED NOT DETECTED Final   Influenza B NOT DETECTED NOT DETECTED Final   Parainfluenza Virus 1 NOT DETECTED NOT DETECTED Final   Parainfluenza Virus  2 NOT DETECTED NOT DETECTED Final   Parainfluenza Virus 3 NOT DETECTED NOT DETECTED Final   Parainfluenza Virus 4 NOT DETECTED NOT DETECTED Final   Respiratory Syncytial Virus NOT DETECTED NOT DETECTED Final   Bordetella pertussis NOT DETECTED NOT DETECTED Final   Bordetella Parapertussis NOT DETECTED NOT DETECTED Final   Chlamydophila pneumoniae NOT DETECTED NOT DETECTED Final   Mycoplasma pneumoniae NOT DETECTED NOT DETECTED Final    Comment: Performed at [facility], [address], [place], [place] [address]  MRSA Next Gen by PCR, Nasal     Status: None   Collection Time: 05/09/24 11:34 PM   Specimen: Nasal Mucosa; Nasal Swab  Result Value Ref Range Status   MRSA by PCR Next Gen NOT DETECTED NOT DETECTED Final    Comment: (NOTE) The GeneXpert MRSA Assay (FDA approved for NASAL specimens only), is one component of a comprehensive MRSA colonization surveillance program. It is not intended to diagnose MRSA infection nor to guide or monitor treatment for MRSA infections. Test performance is not FDA approved in patients less than 46 years old. Performed at [facility], [address] [place], [place] [address]   Acid Fast Smear (AFB)     Status: None   Collection Time: 05/14/24  6:10 AM   Specimen: Sputum  Result Value Ref Range Status   AFB Specimen Processing Concentration  Final   Acid Fast Smear Negative  Final    Comment: (NOTE) Performed At: [facility] [address] [place],  [place] [id number] Jennette Shorter MD Ey:[id number]    Source (AFB) SPU  Final    Comment: Performed at [facility], [address] [place], [place] [address]  Culture, BAL-quantitative w Gram Stain     Status: None (Preliminary result)   Collection Time: 05/16/24  2:19 PM   Specimen: Bronchial Alveolar Lavage; Respiratory  Result Value Ref Range Status   Specimen Description BRONCHIAL ALVEOLAR LAVAGE  Final   Special Requests RML  Final   Gram Stain   Final    FEW WBC PRESENT, PREDOMINANTLY PMN NO ORGANISMS SEEN    Culture   Final    NO GROWTH < 24 HOURS Performed at [facility], [address] [place], [place] [address]    Report Status PENDING  Incomplete   Pertinent Lab.    Latest Ref Rng & Units 05/17/2024    2:57 AM 05/15/2024    3:19 AM 05/14/2024    1:29 AM  CBC  WBC 4.0 - 10.5 K/uL 5.2  8.5  9.6   Hemoglobin 13.0 - 17.0 g/dL 7.0  7.4  7.9   Hematocrit 39.0 - 52.0 % 22.7  23.8  25.3   Platelets 150 - 400 K/uL 377  342  364       Latest Ref Rng & Units 05/17/2024    2:57 AM 05/15/2024    3:19 AM 05/14/2024    1:29 AM  CMP  Glucose 70 - 99 mg/dL 615  75  843   BUN 8 - 23 mg/dL 42  41  43   Creatinine 0.61 - 1.24 mg/dL 7.12  7.13  6.96   Sodium 135 - 145 mmol/L 140  142  139   Potassium 3.5 - 5.1 mmol/L 5.2  4.7  4.9   Chloride 98 - 111 mmol/L 110  112  107   CO2 22 - 32 mmol/L 23  23  23    Calcium 8.9 - 10.3 mg/dL 8.3  8.4  8.4   Total Protein 6.5 - 8.1 g/dL  5.4  5.7   Total Bilirubin 0.0 - 1.2 mg/dL  <9.7  <9.7   Alkaline Phos 38 - 126 U/L  117  125   AST 15 - 41 U/L  11  14   ALT 0 - 44 U/L  14  14      Pertinent Imaging today Plain films and CT images have been personally visualized and interpreted; radiology reports have been reviewed. Decision making incorporated into the Impression /   No results found.  I personally spent a total of 51 minutes in the care of the patient today including preparing to see the patient, getting/reviewing separately  obtained history, performing a medically appropriate exam/evaluation, counseling and educating, placing orders, referring and communicating with other health care professionals, documenting clinical information in the EHR, independently interpreting results, and communicating results.  Electronically signed by:   Annalee Orem, MD Infectious Disease Physician [facility] Pager: [phone]  "

## 2024-05-17 NOTE — Anesthesia Postprocedure Evaluation (Signed)
"   Anesthesia Post Note  Patient: Jess Toney.  Procedure(s) Performed: BRONCHOSCOPY, FLEXIBLE (Bilateral) IRRIGATION, BRONCHUS     Patient location during evaluation: PACU Anesthesia Type: General Level of consciousness: awake and alert Pain management: pain level controlled Vital Signs Assessment: post-procedure vital signs reviewed and stable Respiratory status: spontaneous breathing, nonlabored ventilation, respiratory function stable and patient connected to nasal cannula oxygen  Cardiovascular status: blood pressure returned to baseline and stable Postop Assessment: no apparent nausea or vomiting Anesthetic complications: no   No notable events documented.  Last Vitals:  Vitals:   05/17/24 0800 05/17/24 0810  BP: (!) 139/45 (!) 123/45  Pulse: (!) 55 64  Resp: 20 (!) 23  Temp:  36.8 C  SpO2: 100% 100%    Last Pain:  Vitals:   05/17/24 0810  TempSrc: Oral  PainSc:                  Nahuel Wilbert      "

## 2024-05-17 NOTE — Progress Notes (Signed)
:  20 13:43 12:32 08:08 02:53 1 d ago 1 d ago  Glucose-Capillary 512 High Panic  527 High Panic  CM 553 High Panic  CM     Md Aware, Medicated with insulin  as ordered. Placed order for lab glucose

## 2024-05-18 LAB — CBC
HCT: 21.2 % — ABNORMAL LOW (ref 39.0–52.0)
Hemoglobin: 6.6 g/dL — CL (ref 13.0–17.0)
MCH: 30 pg (ref 26.0–34.0)
MCHC: 31.1 g/dL (ref 30.0–36.0)
MCV: 96.4 fL (ref 80.0–100.0)
Platelets: 391 10*3/uL (ref 150–400)
RBC: 2.2 MIL/uL — ABNORMAL LOW (ref 4.22–5.81)
RDW: 14.6 % (ref 11.5–15.5)
WBC: 9.5 10*3/uL (ref 4.0–10.5)
nRBC: 0 % (ref 0.0–0.2)

## 2024-05-18 LAB — TYPE AND SCREEN
ABO/RH(D): AB POS
Antibody Screen: NEGATIVE
Unit division: 0

## 2024-05-18 LAB — CULTURE, BAL-QUANTITATIVE W GRAM STAIN: Culture: NO GROWTH

## 2024-05-18 LAB — BPAM RBC
Blood Product Expiration Date: 202602282359
ISSUE DATE / TIME: 202602060805
Unit Type and Rh: 8400

## 2024-05-18 LAB — BASIC METABOLIC PANEL WITH GFR
Anion gap: 7 (ref 5–15)
BUN: 42 mg/dL — ABNORMAL HIGH (ref 8–23)
CO2: 23 mmol/L (ref 22–32)
Calcium: 7.9 mg/dL — ABNORMAL LOW (ref 8.9–10.3)
Chloride: 111 mmol/L (ref 98–111)
Creatinine, Ser: 3.16 mg/dL — ABNORMAL HIGH (ref 0.61–1.24)
GFR, Estimated: 19 mL/min — ABNORMAL LOW
Glucose, Bld: 190 mg/dL — ABNORMAL HIGH (ref 70–99)
Potassium: 4.5 mmol/L (ref 3.5–5.1)
Sodium: 141 mmol/L (ref 135–145)

## 2024-05-18 LAB — ACID FAST SMEAR (AFB, MYCOBACTERIA): Acid Fast Smear: NEGATIVE

## 2024-05-18 LAB — GLUCOSE, CAPILLARY
Glucose-Capillary: 100 mg/dL — ABNORMAL HIGH (ref 70–99)
Glucose-Capillary: 137 mg/dL — ABNORMAL HIGH (ref 70–99)
Glucose-Capillary: 162 mg/dL — ABNORMAL HIGH (ref 70–99)
Glucose-Capillary: 164 mg/dL — ABNORMAL HIGH (ref 70–99)
Glucose-Capillary: 165 mg/dL — ABNORMAL HIGH (ref 70–99)
Glucose-Capillary: 227 mg/dL — ABNORMAL HIGH (ref 70–99)
Glucose-Capillary: 250 mg/dL — ABNORMAL HIGH (ref 70–99)
Glucose-Capillary: 71 mg/dL (ref 70–99)
Glucose-Capillary: 79 mg/dL (ref 70–99)

## 2024-05-18 LAB — ABO/RH: ABO/RH(D): AB POS

## 2024-05-18 LAB — MTB-RIF NAA W AFB CULT, NON-SPUTUM

## 2024-05-18 LAB — PREPARE RBC (CROSSMATCH)

## 2024-05-18 MED ORDER — LORAZEPAM 2 MG/ML IJ SOLN: 0.5000 mg | Freq: Once | INTRAMUSCULAR | Status: AC | PRN

## 2024-05-18 MED ORDER — SODIUM CHLORIDE 0.9% IV SOLUTION: Freq: Once | INTRAVENOUS | Status: AC

## 2024-05-18 NOTE — Progress Notes (Incomplete)
 [facility] night cross cover note:   I was notified by the patient's RN  ***   cbg monitoring to ACHS + 0300 d/t concern regarding potential ensuing hypoglycemia.   Anxiety. Added ativan  0.5 mg iv x 1 dose prn.    Eva Pore, DO Hospitalist

## 2024-05-18 NOTE — Plan of Care (Signed)
" °  Problem: Education: Goal: Ability to describe self-care measures that may prevent or decrease complications (Diabetes Survival Skills Education) will improve Outcome: Progressing Goal: Individualized Educational Video(s) Outcome: Progressing   Problem: Coping: Goal: Ability to adjust to condition or change in health will improve Outcome: Progressing   Problem: Fluid Volume: Goal: Ability to maintain a balanced intake and output will improve Outcome: Progressing   Problem: Health Behavior/Discharge Planning: Goal: Ability to identify and utilize available resources and services will improve Outcome: Progressing Goal: Ability to manage health-related needs will improve Outcome: Progressing   Problem: Metabolic: Goal: Ability to maintain appropriate glucose levels will improve Outcome: Progressing   Problem: Nutritional: Goal: Maintenance of adequate nutrition will improve Outcome: Progressing Goal: Progress toward achieving an optimal weight will improve Outcome: Progressing   Problem: Skin Integrity: Goal: Risk for impaired skin integrity will decrease Outcome: Progressing   Problem: Tissue Perfusion: Goal: Adequacy of tissue perfusion will improve Outcome: Progressing   Problem: Education: Goal: Knowledge of General Education information will improve Description: Including pain rating scale, medication(s)/side effects and non-pharmacologic comfort measures Outcome: Progressing   Problem: Health Behavior/Discharge Planning: Goal: Ability to manage health-related needs will improve Outcome: Progressing   Problem: Clinical Measurements: Goal: Ability to maintain clinical measurements within normal limits will improve Outcome: Progressing Goal: Will remain free from infection Outcome: Progressing Goal: Diagnostic test results will improve Outcome: Progressing Goal: Respiratory complications will improve Outcome: Progressing Goal: Cardiovascular complication will  be avoided Outcome: Progressing   Problem: Activity: Goal: Risk for activity intolerance will decrease Outcome: Progressing   Problem: Nutrition: Goal: Adequate nutrition will be maintained Outcome: Progressing   Problem: Coping: Goal: Level of anxiety will decrease Outcome: Progressing   Problem: Elimination: Goal: Will not experience complications related to bowel motility Outcome: Progressing Goal: Will not experience complications related to urinary retention Outcome: Progressing   Problem: Pain Managment: Goal: General experience of comfort will improve and/or be controlled Outcome: Progressing   Problem: Safety: Goal: Ability to remain free from injury will improve Outcome: Progressing   Problem: Skin Integrity: Goal: Risk for impaired skin integrity will decrease Outcome: Progressing   Problem: Activity: Goal: Ability to tolerate increased activity will improve Outcome: Progressing   Problem: Clinical Measurements: Goal: Ability to maintain a body temperature in the normal range will improve Outcome: Progressing   Problem: Respiratory: Goal: Ability to maintain adequate ventilation will improve Outcome: Progressing Goal: Ability to maintain a clear airway will improve Outcome: Progressing   Problem: Education: Goal: Knowledge of the prescribed therapeutic regimen will improve Outcome: Progressing   Problem: Bowel/Gastric: Goal: Gastrointestinal status for postoperative course will improve Outcome: Progressing   Problem: Cardiac: Goal: Ability to maintain an adequate cardiac output Outcome: Progressing Goal: Will show no evidence of cardiac arrhythmias Outcome: Progressing   Problem: Nutritional: Goal: Will attain and maintain optimal nutritional status Outcome: Progressing   Problem: Neurological: Goal: Will regain or maintain usual level of consciousness Outcome: Progressing   Problem: Clinical Measurements: Goal: Ability to maintain  clinical measurements within normal limits Outcome: Progressing Goal: Postoperative complications will be avoided or minimized Outcome: Progressing   Problem: Respiratory: Goal: Will regain and/or maintain adequate ventilation Outcome: Progressing Goal: Respiratory status will improve Outcome: Progressing   Problem: Skin Integrity: Goal: Demonstrates signs of wound healing without infection Outcome: Progressing   Problem: Urinary Elimination: Goal: Will remain free from infection Outcome: Progressing Goal: Ability to achieve and maintain adequate urine output Outcome: Progressing   "

## 2024-05-18 NOTE — Telephone Encounter (Signed)
 I tried to call and schedule and had to lvm for patient to call back

## 2024-05-18 NOTE — Progress Notes (Signed)
 " PROGRESS NOTE    [name].  FMW:[id number] DOB: September 16, 1940 DOA: 05/09/2024 PCP: Sheldon Netter, PA   Brief Narrative:  84 y.o. male with medical history significant of   COPD, CKD 3b, HTN, DM2, hx. lung cancer s/p radiation, gout, chronic normocytic anemia, history of pulmonary embolism in 2024 on Eliquis  who presented with worsening shortness of breath cough and found to have mild hypoxia, imaging concerning for cavitary pneumonia.  Hospitalist called for admission, ID and pulmonology also consulted for assistance.  Assessment & Plan:   Principal Problem:   Cavitary pneumonia Active Problems:   Essential hypertension   Insulin  dependent type 2 diabetes mellitus (HCC)   COPD (chronic obstructive pulmonary disease) (HCC)   Acute respiratory failure with hypoxia (HCC)   BPH (benign prostatic hyperplasia)   History of BPH   Pulmonary embolism (HCC)  Acute on chronic respiratory failure with hypoxia (HCC) Chronically on 2 L Continue to titrate O2 as needed (most recently requiring 4-5L [place]) Increased oxygen  requirement in the setting of necrotizing pneumonia with cavitary lesion(see below) O2 screen daily to wean back to baseline as tolerated   Cavitary pneumonia Rule out atypical infection - ID consulted and following - Imaging concerning for right upper lobe consolidation with cavitation and surrounding infiltration bilaterally - possible necrotizing/inflammatory etiology - Pulmonology consulted for BAL, performed 2/4 without complication - Continue cefepime , Flagyl  (MRSA negative, vancomycin  discontinued) - Initial QuantiFERON is indeterminate, continue airborne precautions with negative testing pending -risk for TB is low, no clear risk factors  Normocytic anemia -Chronic likely anemia of  chronic disease overlapping with iron deficiency anemia -No signs/indications of blood loss anemia FOBT pending 6.7 overnight - 1u PRBC transfuse 2/6  -Anemia panel showed  ferritin 477, serum iron 19, TIBC 162, saturation 12% -B12 3994, folate 18.1 - Hemoglobin stable between 7-8 although mildly downtrending over the past 48h - Continue to monitor H&H intermittently - Transfuse for hemoglobin less than 7  Heart failure ruled out - Echo reassuringly WNL - BNP elevated (likely non cardiac origin) - Diuresed well after giving Lasix  60 mg IV x 1 - Will hold further diuresis at this time as patient appears euvolemic   Hyperkalemia - Mild, Lokelma  x 1 -repeat labs pending  COPD (chronic obstructive pulmonary disease) (HCC) - Continue home medications(currently on nebulizer equivalent of trelegy), not in acute exacerbation - Recently completed prednisone  taper   Essential hypertension Continue Amlodipine  10 mg a day  Carvedilol  12.5 mg twice daily   BPH (benign prostatic hyperplasia) Continue Flomax , finasteride    CKD stage IIIb - Creatinine at baseline   Insulin  dependent type 2 diabetes mellitus (HCC), uncontrolled with hyperglycemia Continue sliding scale insulin  NovoLog  Continue Lantus  10 units subcu daily P.o. intake increasing over the past 24 hours, glucose markedly elevated today, increased sliding scale to resistant will follow clinically    Pulmonary embolism (HCC) RUE DVT, radial, POA -D-dimer elevated 2.80; continue Eliquis  2 times daily -Venous duplex of right upper extremity positive for radial DVT - already on anticoagulation   DVT prophylaxis: apixaban  (ELIQUIS ) tablet 5 mg  Code Status:   Code Status: Full Code Family Communication: Daughter at bedside  Status is: Inpatient  Dispo: The patient is from: Home              Anticipated d/c is to: Home              Anticipated d/c date is: TBD  Patient currently not medically stable for discharge  Consultants:  ID, PCCM  Procedures:  BAL 2/4  Antimicrobials:  Metronidazole /ceftriaxone    Subjective: No acute issues/events overnight - ongoing shortness of  breath  Objective: Vitals:   05/17/24 2042 05/18/24 0100 05/18/24 0453 05/18/24 0725  BP:  131/61 (!) 144/63 (!) 143/46  Pulse: 64 64 69 63  Resp: (!) 22 18 20 20   Temp:  98.6 F (37 C) 98 F (36.7 C) 98 F (36.7 C)  TempSrc:  Oral Oral Oral  SpO2: 100% 100% 99% 100%  Weight:      Height:        Intake/Output Summary (Last 24 hours) at 05/18/2024 0736 Last data filed at 05/18/2024 0224 Gross per 24 hour  Intake 681.06 ml  Output 450 ml  Net 231.06 ml   Filed Weights   05/09/24 1451 05/10/24 1854 05/16/24 1350  Weight: 66 kg 66.3 kg 66.3 kg    Examination:  General:  Pleasantly resting in bed, No acute distress. HEENT:  Normocephalic atraumatic.  Sclerae nonicteric, noninjected.  Extraocular movements intact bilaterally. Neck:  Without mass or deformity.  Trachea is midline. Lungs:  diminished R>L, no overt wheeze or rales Heart:  Regular rate and rhythm.  Without murmurs, rubs, or gallops. Abdomen:  Soft, nontender, nondistended.  Without guarding or rebound. Extremities: Without cyanosis, clubbing, edema, or obvious deformity. Skin:  Warm and dry, no erythema.  Data Reviewed: I have personally reviewed following labs and imaging studies  CBC: Recent Labs  Lab 05/11/24 2139 05/13/24 0125 05/14/24 0129 05/15/24 0319 05/17/24 0257 05/18/24 0257  WBC 9.3 9.0 9.6 8.5 5.2 9.5  NEUTROABS 7.9*  --   --   --   --   --   HGB 7.3* 8.0* 7.9* 7.4* 7.0* 6.6*  HCT 23.1* 25.4* 25.3* 23.8* 22.7* 21.2*  MCV 96.3 95.5 95.8 96.4 96.2 96.4  PLT 351 358 364 342 377 391   Basic Metabolic Panel: Recent Labs  Lab 05/13/24 0125 05/14/24 0129 05/15/24 0319 05/17/24 0257 05/17/24 1630 05/18/24 0257  NA 139 139 142 140  --  141  K 4.8 4.9 4.7 5.2*  --  4.5  CL 111 107 112* 110  --  111  CO2 20* 23 23 23   --  23  GLUCOSE 63* 156* 75 384* 370* 190*  BUN 43* 43* 41* 42*  --  42*  CREATININE 2.93* 3.03* 2.86* 2.87*  --  3.16*  CALCIUM 8.3* 8.4* 8.4* 8.3*  --  7.9*    GFR: Estimated Creatinine Clearance: 16.3 mL/min (A) (by C-G formula based on SCr of 3.16 mg/dL (H)). Liver Function Tests: Recent Labs  Lab 05/11/24 2139 05/13/24 0125 05/14/24 0129 05/15/24 0319  AST 14* 14* 14* 11*  ALT 15 13 14 14   ALKPHOS 113 116 125 117  BILITOT <0.2 <0.2 <0.2 <0.2  PROT 5.4* 5.6* 5.7* 5.4*  ALBUMIN  2.3* 2.2* 2.4* 2.2*   No results for input(s): LIPASE, AMYLASE in the last 168 hours. Recent Labs  Lab 05/14/24 0129  AMMONIA 23   Coagulation Profile: No results for input(s): INR, PROTIME in the last 168 hours. Cardiac Enzymes: No results for input(s): CKTOTAL, CKMB, CKMBINDEX, TROPONINI in the last 168 hours.  BNP (last 3 results) Recent Labs    04/13/24 1518 04/24/24 1104 05/11/24 2139  PROBNP 3,516.0* 1,427.0* 2,066.0*   HbA1C: No results for input(s): HGBA1C in the last 72 hours. CBG: Recent Labs  Lab 05/18/24 0118 05/18/24 0154 05/18/24 0451 05/18/24 9295  05/18/24 0726  GLUCAP 71 137* 164* 165* 162*   Lipid Profile: No results for input(s): CHOL, HDL, LDLCALC, TRIG, CHOLHDL, LDLDIRECT in the last 72 hours. Thyroid  Function Tests: No results for input(s): TSH, T4TOTAL, FREET4, T3FREE, THYROIDAB in the last 72 hours. Anemia Panel: No results for input(s): VITAMINB12, FOLATE, FERRITIN, TIBC, IRON, RETICCTPCT in the last 72 hours. Sepsis Labs: No results for input(s): PROCALCITON, LATICACIDVEN in the last 168 hours.   Recent Results (from the past 240 hours)  Culture, blood (routine x 2)     Status: None   Collection Time: 05/09/24 10:06 PM   Specimen: BLOOD  Result Value Ref Range Status   Specimen Description BLOOD SITE NOT SPECIFIED  Final   Special Requests   Final    BOTTLES DRAWN AEROBIC AND ANAEROBIC Blood Culture adequate volume   Culture   Final    NO GROWTH 5 DAYS Performed at [facility], [address] [place], [place] [address]    Report Status  05/14/2024 FINAL  Final  Culture, blood (routine x 2)     Status: None   Collection Time: 05/09/24 10:06 PM   Specimen: BLOOD  Result Value Ref Range Status   Specimen Description BLOOD SITE NOT SPECIFIED  Final   Special Requests   Final    BOTTLES DRAWN AEROBIC AND ANAEROBIC Blood Culture adequate volume   Culture   Final    NO GROWTH 5 DAYS Performed at [facility], [address] [place], [place] [address]    Report Status 05/14/2024 FINAL  Final  Expectorated Sputum Assessment w Gram Stain, Rflx to Resp Cult     Status: None   Collection Time: 05/09/24 10:52 PM   Specimen: Expectorated Sputum  Result Value Ref Range Status   Specimen Description EXPECTORATED SPUTUM  Final   Special Requests Immunocompromised  Final   Sputum evaluation   Final    THIS SPECIMEN IS ACCEPTABLE FOR SPUTUM CULTURE Performed at [facility], [address], [place], [place] [address]    Report Status 05/12/2024 FINAL  Final  Culture, Respiratory w Gram Stain     Status: None   Collection Time: 05/09/24 10:52 PM  Result Value Ref Range Status   Specimen Description EXPECTORATED SPUTUM  Final   Special Requests Immunocompromised Reflexed from [id number]  Final   Gram Stain   Final    RARE WBC PRESENT, PREDOMINANTLY PMN MODERATE GRAM POSITIVE COCCI IN CLUSTERS FEW YEAST FEW GRAM NEGATIVE RODS RARE GRAM POSITIVE RODS    Culture   Final    FEW Normal respiratory flora-no Staph aureus or Pseudomonas seen Performed at [facility], [address] [place], [place] [address]    Report Status 05/14/2024 FINAL  Final  Respiratory (~20 pathogens) panel by PCR     Status: None   Collection Time: 05/09/24 10:57 PM   Specimen: Nasopharyngeal Swab; Respiratory  Result Value Ref Range Status   Adenovirus NOT DETECTED NOT DETECTED Final   Coronavirus 229E NOT DETECTED NOT DETECTED Final    Comment: (NOTE) The Coronavirus on the Respiratory Panel, DOES NOT test for the novel  Coronavirus  (2019 nCoV)    Coronavirus HKU1 NOT DETECTED NOT DETECTED Final   Coronavirus NL63 NOT DETECTED NOT DETECTED Final   Coronavirus OC43 NOT DETECTED NOT DETECTED Final   Metapneumovirus NOT DETECTED NOT DETECTED Final   Rhinovirus / Enterovirus NOT DETECTED NOT DETECTED Final   Influenza A NOT DETECTED NOT DETECTED  Final   Influenza B NOT DETECTED NOT DETECTED Final   Parainfluenza Virus 1 NOT DETECTED NOT DETECTED Final   Parainfluenza Virus 2 NOT DETECTED NOT DETECTED Final   Parainfluenza Virus 3 NOT DETECTED NOT DETECTED Final   Parainfluenza Virus 4 NOT DETECTED NOT DETECTED Final   Respiratory Syncytial Virus NOT DETECTED NOT DETECTED Final   Bordetella pertussis NOT DETECTED NOT DETECTED Final   Bordetella Parapertussis NOT DETECTED NOT DETECTED Final   Chlamydophila pneumoniae NOT DETECTED NOT DETECTED Final   Mycoplasma pneumoniae NOT DETECTED NOT DETECTED Final    Comment: Performed at [facility], [address] [place], [place] [address]  MRSA Next Gen by PCR, Nasal     Status: None   Collection Time: 05/09/24 11:34 PM   Specimen: Nasal Mucosa; Nasal Swab  Result Value Ref Range Status   MRSA by PCR Next Gen NOT DETECTED NOT DETECTED Final    Comment: (NOTE) The GeneXpert MRSA Assay (FDA approved for NASAL specimens only), is one component of a comprehensive MRSA colonization surveillance program. It is not intended to diagnose MRSA infection nor to guide or monitor treatment for MRSA infections. Test performance is not FDA approved in patients less than 40 years old. Performed at [facility], [address], [place], [place] [address]   Acid Fast Smear (AFB)     Status: None   Collection Time: 05/14/24  6:10 AM   Specimen: Sputum  Result Value Ref Range Status   AFB Specimen Processing Concentration  Final   Acid Fast Smear Negative  Final    Comment: (NOTE) Performed At: [facility] [address] [place], [place] [id number] Jennette Shorter  MD Ey:[id number]    Source (AFB) SPU  Final    Comment: Performed at [facility], [address] [place], [place] [address]  Culture, BAL-quantitative w Gram Stain     Status: None (Preliminary result)   Collection Time: 05/16/24  2:19 PM   Specimen: Bronchial Alveolar Lavage; Respiratory  Result Value Ref Range Status   Specimen Description BRONCHIAL ALVEOLAR LAVAGE  Final   Special Requests RML  Final   Gram Stain   Final    FEW WBC PRESENT, PREDOMINANTLY PMN NO ORGANISMS SEEN    Culture   Final    NO GROWTH < 24 HOURS Performed at [facility], [address] [place], [place] [address]    Report Status PENDING  Incomplete         Radiology Studies: VAS US  UPPER EXTREMITY VENOUS DUPLEX Result Date: 05/16/2024 UPPER VENOUS STUDY  Patient Name:  Daeshawn Redmann.  Date of Exam:   05/16/2024 Medical Rec #: [id number]            Accession #:    [id number] Date of Birth: [birth year]             Patient Gender: M Patient Age:   33 years Exam Location:  [facility] Procedure:      VAS US  UPPER EXTREMITY VENOUS DUPLEX Referring Phys: ANNALEE OREM --------------------------------------------------------------------------------  Indications: Pain, and Swelling Other Indications: Recent IV to RUE. Comparison Study: No previous exams Performing Technologist: Jody Hill RVT, RDMS  Examination Guidelines: A complete evaluation includes B-mode imaging, spectral Doppler, color Doppler, and power Doppler as needed of all accessible portions of each vessel. Bilateral testing is considered an integral part of a complete examination. Limited examinations for reoccurring indications may be performed as noted.  Right Findings: +----------+------------+---------+-----------+----------+-------------------+  RIGHT     CompressiblePhasicitySpontaneousProperties      Summary       +----------+------------+---------+-----------+----------+-------------------+ IJV           Full        Yes       Yes                                  +----------+------------+---------+-----------+----------+-------------------+ Subclavian    Full       Yes       Yes                                  +----------+------------+---------+-----------+----------+-------------------+ Axillary      Full       Yes       Yes                                  +----------+------------+---------+-----------+----------+-------------------+ Brachial      Full       Yes       Yes                                  +----------+------------+---------+-----------+----------+-------------------+ Radial        None       No        No               Acute one of paired +----------+------------+---------+-----------+----------+-------------------+ Ulnar         Full                                                      +----------+------------+---------+-----------+----------+-------------------+ Cephalic      Full                                                      +----------+------------+---------+-----------+----------+-------------------+ Basilic       Full                                                      +----------+------------+---------+-----------+----------+-------------------+  Left Findings: +----------+------------+---------+-----------+----------+-------+ LEFT      CompressiblePhasicitySpontaneousPropertiesSummary +----------+------------+---------+-----------+----------+-------+ Subclavian    Full       Yes       Yes                      +----------+------------+---------+-----------+----------+-------+  Summary:  Right: No evidence of superficial vein thrombosis in the upper extremity. Findings consistent with acute deep vein thrombosis involving one of the right radial veins.  Left: No evidence of thrombosis in the subclavian.  *See table(s) above for measurements and observations.  Diagnosing physician: Lonni Gaskins MD Electronically signed by  Lonni Gaskins MD on 05/16/2024 at 6:36:24 PM.    Final  Scheduled Meds:  sodium chloride    Intravenous Once   amLODipine   10 mg Oral Daily   apixaban   5 mg Oral BID   arformoterol   15 mcg Nebulization BID   budesonide  (PULMICORT ) nebulizer solution  0.5 mg Nebulization BID   carvedilol   12.5 mg Oral BID WC   finasteride   5 mg Oral Daily   guaiFENesin   600 mg Oral BID   insulin  aspart  0-20 Units Subcutaneous TID WC   insulin  glargine  10 Units Subcutaneous Daily   revefenacin   175 mcg Nebulization Daily   simvastatin   10 mg Oral QHS   sodium chloride  HYPERTONIC  4 mL Nebulization BID   tamsulosin   0.8 mg Oral QHS   Continuous Infusions:  cefTRIAXone  (ROCEPHIN )  IV 200 mL/hr at 05/17/24 1708   metronidazole  500 mg (05/17/24 2152)     LOS: 9 days   Time spent:   Elsie JAYSON Montclair, DO [facility]  If 7PM-7AM, please contact night-coverage [email or web address]  05/18/2024, 7:36 AM      "

## 2024-05-18 NOTE — TOC Progression Note (Addendum)
 Transition of Care [facility]) - Progression Note    Patient Details  Name: Jeffrey Randall. MRN: [id number] Date of Birth: [birth year]  Transition of Care [facility]) CM/SW Contact  Waddell Barnie Rama, RN Phone Number: 05/18/2024, 4:18 PM  Clinical Narrative:    Hgb 6.6 today, will be transfused, continues to be ruling out TB.   RUE DVT on eliquis .  ICM following.   Expected Discharge Plan: Home/Self Care Barriers to Discharge: Continued Medical Work up               Expected Discharge Plan and Services In-house Referral: NA Discharge Planning Services: CM Consult Post Acute Care Choice: NA Living arrangements for the past 2 months: Single Family Home                 DME Arranged: N/A DME Agency: NA       HH Arranged: NA           Social Drivers of Health (SDOH) Interventions SDOH Screenings   Food Insecurity: No Food Insecurity (05/10/2024)  Housing: Low Risk (05/10/2024)  Transportation Needs: No Transportation Needs (05/10/2024)  Utilities: Not At Risk (05/10/2024)  Social Connections: Moderately Isolated (05/10/2024)  Tobacco Use: High Risk (05/16/2024)    Readmission Risk Interventions    05/11/2024    3:59 PM 04/27/2024   10:33 AM 04/26/2024   10:22 AM  Readmission Risk Prevention Plan  Transportation Screening Complete Complete Complete  PCP or Specialist Appt within 3-5 Days   Complete  HRI or Home Care Consult   Complete  Social Work Consult for Recovery Care Planning/Counseling   Complete  Palliative Care Screening   Not Applicable  Medication Review Oceanographer) Complete Referral to Pharmacy Referral to Pharmacy  PCP or Specialist appointment within 3-5 days of discharge  Complete   HRI or Home Care Consult Complete Complete   SW Recovery Care/Counseling Consult  Complete   Palliative Care Screening Not Applicable Not Applicable   Skilled Nursing Facility Not Applicable Not Applicable

## 2024-05-22 ENCOUNTER — Other Ambulatory Visit (HOSPITAL_COMMUNITY)

## 2024-08-01 ENCOUNTER — Ambulatory Visit: Admitting: Pulmonary Disease
